# Patient Record
Sex: Female | Born: 1970 | Race: Black or African American | Hispanic: No | Marital: Single | Smoking: Never smoker
Health system: Southern US, Community
[De-identification: ages and names within clinical notes are randomized; demographics above are authoritative.]

## PROBLEM LIST (undated history)

## (undated) DIAGNOSIS — F419 Anxiety disorder, unspecified: Secondary | ICD-10-CM

## (undated) DIAGNOSIS — I5189 Other ill-defined heart diseases: Secondary | ICD-10-CM

## (undated) DIAGNOSIS — R591 Generalized enlarged lymph nodes: Secondary | ICD-10-CM

## (undated) DIAGNOSIS — F329 Major depressive disorder, single episode, unspecified: Secondary | ICD-10-CM

## (undated) DIAGNOSIS — R079 Chest pain, unspecified: Secondary | ICD-10-CM

## (undated) DIAGNOSIS — D869 Sarcoidosis, unspecified: Secondary | ICD-10-CM

## (undated) DIAGNOSIS — F32A Depression, unspecified: Secondary | ICD-10-CM

## (undated) DIAGNOSIS — I493 Ventricular premature depolarization: Secondary | ICD-10-CM

## (undated) DIAGNOSIS — IMO0001 Reserved for inherently not codable concepts without codable children: Secondary | ICD-10-CM

## (undated) DIAGNOSIS — Z9289 Personal history of other medical treatment: Secondary | ICD-10-CM

## (undated) DIAGNOSIS — J189 Pneumonia, unspecified organism: Secondary | ICD-10-CM

## (undated) DIAGNOSIS — E0590 Thyrotoxicosis, unspecified without thyrotoxic crisis or storm: Secondary | ICD-10-CM

## (undated) DIAGNOSIS — R000 Tachycardia, unspecified: Secondary | ICD-10-CM

## (undated) DIAGNOSIS — J45909 Unspecified asthma, uncomplicated: Secondary | ICD-10-CM

## (undated) DIAGNOSIS — A419 Sepsis, unspecified organism: Secondary | ICD-10-CM

## (undated) DIAGNOSIS — M797 Fibromyalgia: Secondary | ICD-10-CM

## (undated) DIAGNOSIS — R32 Unspecified urinary incontinence: Secondary | ICD-10-CM

## (undated) HISTORY — DX: Other ill-defined heart diseases: I51.89

## (undated) HISTORY — DX: Generalized enlarged lymph nodes: R59.1

## (undated) HISTORY — DX: Pneumonia, unspecified organism: J18.9

## (undated) HISTORY — DX: Sepsis, unspecified organism: A41.9

## (undated) HISTORY — DX: Chest pain, unspecified: R07.9

## (undated) HISTORY — DX: Thyrotoxicosis, unspecified without thyrotoxic crisis or storm: E05.90

## (undated) HISTORY — DX: Personal history of other medical treatment: Z92.89

## (undated) HISTORY — DX: Ventricular premature depolarization: I49.3

## (undated) HISTORY — DX: Sarcoidosis, unspecified: D86.9

## (undated) HISTORY — DX: Tachycardia, unspecified: R00.0

## (undated) HISTORY — PX: LYMPH NODE BIOPSY: SHX201

---

## 2005-07-23 ENCOUNTER — Emergency Department: Payer: Self-pay | Admitting: Emergency Medicine

## 2005-07-23 ENCOUNTER — Other Ambulatory Visit: Payer: Self-pay

## 2008-01-30 ENCOUNTER — Emergency Department: Payer: Self-pay | Admitting: Emergency Medicine

## 2011-06-25 ENCOUNTER — Emergency Department: Payer: Self-pay | Admitting: Emergency Medicine

## 2014-11-03 ENCOUNTER — Inpatient Hospital Stay: Payer: Self-pay | Admitting: Internal Medicine

## 2014-11-06 ENCOUNTER — Ambulatory Visit
Admit: 2014-11-06 | Disposition: A | Discharge status: Home / Self Care | Payer: Self-pay | Attending: Internal Medicine | Admitting: Internal Medicine

## 2014-11-19 ENCOUNTER — Inpatient Hospital Stay: Payer: Self-pay | Admitting: Internal Medicine

## 2014-11-20 ENCOUNTER — Ambulatory Visit
Admit: 2014-11-20 | Disposition: A | Discharge status: Home / Self Care | Payer: Self-pay | Attending: Internal Medicine | Admitting: Internal Medicine

## 2014-11-20 ENCOUNTER — Inpatient Hospital Stay: Payer: Self-pay | Admitting: Internal Medicine

## 2014-11-22 ENCOUNTER — Encounter: Payer: Self-pay | Admitting: Internal Medicine

## 2014-11-22 ENCOUNTER — Encounter (INDEPENDENT_AMBULATORY_CARE_PROVIDER_SITE_OTHER): Payer: Self-pay

## 2014-11-22 ENCOUNTER — Ambulatory Visit (INDEPENDENT_AMBULATORY_CARE_PROVIDER_SITE_OTHER): Payer: 59 | Admitting: Internal Medicine

## 2014-11-22 VITALS — BP 132/76 | HR 68 | Temp 97.6°F | Ht <= 58 in | Wt 116.0 lb

## 2014-11-22 DIAGNOSIS — R059 Cough, unspecified: Secondary | ICD-10-CM

## 2014-11-22 DIAGNOSIS — R0689 Other abnormalities of breathing: Secondary | ICD-10-CM

## 2014-11-22 DIAGNOSIS — R0600 Dyspnea, unspecified: Secondary | ICD-10-CM

## 2014-11-22 DIAGNOSIS — R05 Cough: Secondary | ICD-10-CM

## 2014-11-22 DIAGNOSIS — J984 Other disorders of lung: Secondary | ICD-10-CM

## 2014-11-22 DIAGNOSIS — H538 Other visual disturbances: Secondary | ICD-10-CM

## 2014-11-22 DIAGNOSIS — R0602 Shortness of breath: Secondary | ICD-10-CM | POA: Insufficient documentation

## 2014-11-22 NOTE — Assessment & Plan Note (Signed)
Shortness of breath and cough secondary to cavitary lung lesion, deconditioning, chronic illness over the past 3 or 4 months. See plan to cavitary lesion

## 2014-11-22 NOTE — Patient Instructions (Signed)
Follow up with Dr. Dema SeverinMungal in 1-2 weeks - you have a cavitary left lung lesion - suspicious for fungal infection or sarcoid or a combination of both. Workup as dictated below and awaiting final test results from your hospitalization (final fungal cultures, etc).  - we will schedule an ECHO and EKG with [facility]  - follow up with ID (Dr. Sampson GoonFitzgerald), continue with voriconazole based on his recommendations - pulmonary function testing and 6 minute walk test prior to follow up - please make an appointment to [place] prior to your follow up me (please ask them to fax copy of note to our office) - we will probably start steroids (Prednisone 40mg ), but after the above exams and follow up with Dr. Sampson GoonFitzgerald - please keep your appointment with [facility] on 11/26/14 at 8.20am.

## 2014-11-22 NOTE — Assessment & Plan Note (Signed)
Cough secondary to cavitary lung lesion, possible fungal infection from cavitary lung lesion, suspected sarcoidosis, lower on the differential is lymphoma.  Plan: -Supportive care and management as outlined for cavitary lung lesion

## 2014-11-22 NOTE — Progress Notes (Signed)
Date: 11/22/2014  MRN# [id number] Jennifer [name] 08/29/1971  [facility]  Jennifer PrestoRenee [name] is a 44 y.o. old female seen in consultation for hospital follow of cavitary lung lesion  CC:  Chief Complaint  Patient presents with  . Hospitalization Follow-up    Pt was d/c from the hospital. She had pneumonia. Pt c/o sob, cough with yellow mucus and chest tightness off/on.    HPI:  Patient is a pleasant 44 year old female is presenting today for hospital followup of cavitary lung lesion, bilateral pneumonia, evaluation for sarcoid. Briefly, patient was having increased shortness of breath and chest tightness and presented to the ED on February 27 found to have bilateral pneumonia chest x-ray subsequent CT showed a large left upper lobe cavitary lesion, she was followed by pulmonary, Dr. Belia HemanKasa, Hematology\Oncology (Dr. Sherrlyn HockPandit) and surgery (Dr. Sharmon RevereLindquist). Her prehospitalization course is described with recurrent upper respiratory tract infections, dyspnea on exertion, cough, fatigue, 30 pound weight loss. Prior to hospitalization she also endorsed fever and chills accompanied with night sweats. 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 Today patient states that she still having significant amount of cough, productive sputum (which is thick and white), vomiting after cough, shortness of breath on exertion, blurry vision. Patient states that she's had blurred vision for at least a year. She denies any skin lesions of tender nodules on her lower extremities. Prior to hospitalization she does endorse intermittent episodes of nausea and vomiting accompanied with shortness of breath (as stated above). Patient also noted to have a PET CT done during the hospitalization that showed significant hypermetabolic activity in the axillary lymph nodes, this was followed by left axillary lymph node dissection, samples were sent for culture and pathology  review. Her differential diagnosis during hospitalization include fungal pneumonia, sarcoidosis, lymphoma-or a combination of any one of these. Off note, patient states that she was treated multiple times in the last 3-4 months with antibiotics and steroids; each time after steroid use her symptoms drastically improved.      [facility] Hospitalization Summary: DATE OF ADMISSION:  11/03/2014 DATE OF DISCHARGE:  11/13/2014  FINAL DIAGNOSES:  1.  Clinical sepsis, bilateral community-acquired pneumonia with cavitary lesions, turned out to be a fungal pneumonia.  2.  Diffuse lymphadenopathy, preliminary results show granulomatous disease and this could possibly be sarcoid.  3.  Tachycardia.   MEDICATIONS ON DISCHARGE: Include Tylenol 500 mg 2 tablets every 6 hours as needed for pain, metoprolol tartrate 25 mg twice a day, codeine guaifenesin 10/100 per 5 mL, 5 mL every 8 hours for 10 days as needed for cough. Tessalon Perles 100 mg every 6 hours as needed for cough, voriconazole 200 mg every 12 hours for 15 days.   HOME HEALTH: None.   OXYGEN: No.    DIET: Regular diet, regular consistency.   FOLLOWUP: With Dr. Dema SeverinMungal, pulmonary, 1 week, Dr. Sherrlyn HockPandit, hematology/oncology, 1 week, Dr. Sampson GoonFitzgerald, infectious disease, 2 weeks, 1 to 2 weeks with your primary care physician.     HOSPITAL COURSE: The patient was admitted 11/03/2014, discharged 11/13/2014. Please see interim summary dictated by Dr. Elpidio AnisSudini  on March 4 for hospital course up until that point.   Laboratory and radiological data during the hospital course included an EKG, sinus tachycardia. Chest x-ray: Biapical airspace opacities, left greater than right, interstitial accenuation of the left perihilar airspace opacities. HIV test negative. Lipase normal. Urine pregnancy test negative. White count upon admission 7.5, hemoglobin 11.3, glucose 97, BUN 7,  creatinine 0.81, sodium 135, potassium 4.2, chloride 104, CO2 22, calcium 8.6. Liver function  test normal range. Urinalysis: Two plus blood.  Streptococcus pneumoniae antigen negative. Lactic acid 1.3. Hemoglobin A1c 4.9. Influenza negative. Blood cultures negative. CT scan of the chest with contrast showed highly unusual appearance of the chest, some imaging features suggestive of a systemic disease such as sarcoidosis, large thick-walled cavitary in the left upper lobe, atypical infection, malignancy not excluded, extensive mediastinal bilateral hilar, supraclavicular, and bilateral axillary lymphadenopathy could be suggestive of lymphoma. The patient had a bronchoscopy done on March 2 that was negative for malignant cells. Bronchial washings: Culture showed a heavy growth of fungus. As per Dr. Sampson Goon likely Aspergillus. Acid-fast smear negative. PET scan done on March 4 was hypermetabolic lymphadenopathy neck, chest, abdomen, and pelvis consistent with metabolic active tumor. Large necrotic left upper lobe lesion is hypermetabolic. Diffuse hypermetabolic uptake in the marrow space consistent with bony involvement. Hepatitis C negative, hepatitis B surface antigen negative. FANA negative. LDH 146.  ACE level 64 which is normal range. Lymph node biopsy fungus culture negative and negative for infection. White count upon discharge 10.8, creatinine 0.72. Liver function test: AST slightly elevated at 38.   Hospital course per problem list:   1.  For the patient's clinical sepsis with pneumonia believed to be community-acquired, required a bronchoscopy. Fungus grew out of the culture, possibly Aspergillus as per Dr. Sampson Goon. The patient has completed course of antibiotic, now is on voriconazole for another 15 days post discharge. Will need to follow up with Dr. Sampson Goon prior to finishing antibiotic treatment. This may be a prolonged course of voriconazole if this is an Aspergillus. The patient still has cough and was given some cough suppressants upon going home.  2.  Diffuse lymphadenopathy. I  spoke with the pathologist. Preliminary results looking at the film was granulomatous disease so the patient may actually have sarcoid. I did set up the patient with a followup appointment with Dr. Dema Severin. I spoke with Dr. Belia Heman who did not want to start steroids at this time with a fungal infection in the lungs but this could be a possibility as outpatient. We will refer to Dr. Dema Severin.  3.  Tachycardia. Metoprolol prescribed 25 mg twice a day to keep heart rate under control, likely secondary to the lung infection.   TIME SPENT ON DISCHARGE: 35 minutes.     Procedures: 11/07/14 - Bronchoscopy with LUL Brushing and BAL by Dr. Belia Heman - findings - LUL mucosal erythema and thick mucus.  11/12/14 - Left axillary LN dissection by Dr. Juliann Pulse   PMHX:   Past Medical History  Diagnosis Date  . Tachycardia   . Sepsis   . Pneumonia     bilateral community acquired   . Lymphadenopathy    Surgical Hx:  Past Surgical History  Procedure Laterality Date  . Cesarean section      1992/1994   Family Hx:  Family History  Problem Relation Age of Onset  . Lung cancer Mother    Social Hx:   History  Substance Use Topics  . Smoking status: Never Smoker   . Smokeless tobacco: Never Used  . Alcohol Use: No   Medication:   Current Outpatient Rx  Name  Route  Sig  Dispense  Refill  . acetaminophen (TYLENOL) 500 MG tablet   Oral   Take 500 mg by mouth every 6 (six) hours as needed.         . benzonatate (TESSALON) 100 MG  capsule   Oral   Take 100 mg by mouth every 6 (six) hours as needed.      0   . GUAIFENESIN AC 100-10 MG/5ML syrup   Oral   Take 5 mLs by mouth every 8 (eight) hours as needed.      0     Dispense as written.   . metoprolol tartrate (LOPRESSOR) 25 MG tablet   Oral   Take 25 mg by mouth 2 (two) times daily.      0   . voriconazole (VFEND) 200 MG tablet   Oral   Take 200 mg by mouth 2 (two) times daily.             Allergies:  Penicillin g  Review of  Systems: Gen:  Denies  fever, sweats, chills HEENT: blurred\fuzzy vision  Cvc:  No dizziness, chest pain or heaviness Resp: Admints cough or sputum porduction, shortness of breath Gi: Admits to stomach pain, nausea/vomiting, diarrhea, constipation Gu:  Denies bladder incontinence, burning urine Ext:   No Joint pain, stiffness or swelling Skin: No skin rash, easy bruising or bleeding or hives.  Admits to dry skin Endoc:  No polyuria, polydipsia , polyphagia.  Weight loss over the last 3 months Psych: No depression, insomnia or hallucinations  Other:  All other systems negative  Physical Examination:   VS: BP 132/76 mmHg  Pulse 68  Temp(Src) 97.6 F (36.4 C) (Oral)  Ht  (1.448 m)  Wt 116 lb (52.617 kg)  BMI 25.10 kg/m2  SpO2 98%  General Appearance: No distress  Neuro:without focal findings, mental status, speech normal, alert and oriented, cranial nerves 2-12 intact, reflexes normal and symmetric, sensation grossly normal  HEENT: PERRLA, EOM intact, no ptosis, no other lesions noticed; Mallampati: 2  Pulmonary: coarse upper airway sounds with transmission to the other lung fields, dec BS in the LUL, no wheezing, No rales;   Sputum Production:  none CardiovascularNormal S1,S2.  No m/r/g.  Abdominal aorta pulsation normal.    Abdomen: Benign, Soft, non-tender, No masses, hepatosplenomegaly, Renal:  No costovertebral tenderness  GU:  No performed at this time. Endoc: No evident thyromegaly, no signs of acromegaly or Cushing features Skin:   warm, no rashes, no ecchymosis, no skin nodules.  + Dry skin. Left axilla LN biopsy incision site with good healing, no drainage.  Extremities: normal, no cyanosis, clubbing, no edema, warm with normal capillary refill. Other findings:none   Labs results: 11/2014 ACE =64 (within normal range) ANA ab, IFA = negative HIV 1/2 = negative Hepatitis C negative Hepatitis B surface antigen negative.  FANA negative.  LDH 146.   Lymph node biopsy  fungus culture negative and negative for infection. AFB Smear = negative Fungal BAL culture - heavy fungal growth (samples sent to state lab) BAL = negative for malignant cells Left axillary lymph node biopsy-largest lymph node effaced by a noncaseating granulomatous inflammation; small node without morphologic evidence of malignancy and residual lymphoid tissue and partially involved by noncaseating granulomas. (Fungal and Gram stain are negative).   Rad results: (The following images and results were reviewed by Dr. Dema Severin). CT  - CT CHEST WITH CONTRAST  - Nov 05 2014   CLINICAL DATA:  44 year old female with history of cough and congestion for greater than 1 week, with left-sided chest pain. Symptoms acutely worsening over the past 72 hours.  EXAM: CT CHEST WITH CONTRAST  TECHNIQUE: Multidetector CT imaging of the chest was performed during intravenous contrast administration. CONTRAST:  75 mL of Omnipaque 300.  COMPARISON:  No priors.  FINDINGS: Mediastinum/Lymph Nodes: Heart size is normal. There is no significant pericardial fluid, thickening or pericardial calcification. Multiple borderline enlarged and enlarged mediastinal and hilar lymph nodes are noted, and including a 14 mm superior mediastinal lymph node (image 12 of series 2), 13 mm short axis left suprahilar lymph node, 10 mm high left paratracheal lymph node, 11 mm subcarinal node, and 11 mm right hilar lymph node. Esophagus is mildly patulous. Bilateral axillary lymphadenopathy (left greater than right), with the largest left axillary lymph node measuring 15 mm in short axis. Multiple enlarged supraclavicular lymph nodes bilaterally measuring up to 11 mm.  Lungs/Pleura: Extensive multifocal thickening of the peribronchovascular interstitium with extensive multifocal peribronchovascular micro and macronodularity most evident in the mid to upper lungs bilaterally, with relative sparing of the lung bases. In the  left upper lobe there is a large thick-walled cavity measuring approximately 4.5 x 3.8 x 4.9 cm. Additional thickening in the lungs bilaterally. Upward retraction of hilar structures. Small left pleural effusion layering dependently. No right pleural effusion.  Upper Abdomen: Multiple borderline enlarged and mildly enlarged retroperitoneal lymph nodes, largest of which measures up to 1.0 cm in short axis. Musculoskeletal/Soft Tissues: There are no aggressive appearing lytic or blastic lesions noted in the visualized portions of the skeleton.   IMPRESSION: 1. Highly unusual appearance of the chest. There are some imaging features suggestive of a systemic disease such as sarcoidosis, as detailed above. However, given the upper lung predominant findings, and the presence of a large thick-walled cavity in the left upper lobe, the possibility of atypical infection, including mycobacterium tuberculosis, warrants strong consideration. If there is any clinical history concerning for possible tuberculosis, respiratory isolation and sputum testing is recommended at this time. 2. Malignancy is not excluded, but given the mid and upper lung predominance of the findings, and the perilymphatic (rather than random) distribution of the pulmonary nodules, pulmonary malignancy (either primary or metastatic) is not favored at this time. 3. However, given the presence of extensive mediastinal, bilateral hilar, supraclavicular and bilateral axillary lymphadenopathy, underlying lymphoproliferative disorder such as lymphoma may be present. 4. Small left pleural effusion layering dependently.      PET CT 11/09/14 NUCLEAR MEDICINE PET SKULL BASE TO THIGH  TECHNIQUE: 12.3 mCi F-18 FDG was injected intravenously. Full-ring PET imaging was performed from the skull base to thigh after the radiotracer. CT data was obtained and used for attenuation correction and anatomic localization.  FASTING BLOOD  GLUCOSE:  Value: 111 mg/dl  COMPARISON:  CT scan from 11/05/2014  FINDINGS: NECK  Scattered small lymph nodes are seen in the neck bilaterally and are hypermetabolic with SUV max values in the 5 range.  CHEST  Hypermetabolic lymphadenopathy is seen in both supraclavicular regions. 1.2 x 1.2 cm right supraclavicular node visible on image 45 series 3. The large necrotic mass in the left upper lobe is markedly hypermetabolic with SUV max = 8.4. Disease in the right upper lobe is also hypermetabolic with SUV max = 5.3. 11 x 20 mm left axillary lymph node demonstrates SUV max = 3.7. Mediastinal and hilar lymphadenopathy is hypermetabolic.  ABDOMEN/PELVIS  There is some focal hyper metabolism in the region of the hepatoduodenal ligament. Although the underlying lymphadenopathy can be seen on the uninfused CT images, appearance is suspicious for upper abdominal lymph node involvement.  There are hypermetabolic lymph nodes in both groin regions, and along the pelvic sidewalls bilaterally. Index lymph node in the  right groin area measures SUV max = 7.4  SKELETON  Diffuse uptake within the skeletal structures suggest metastatic involvement of the marrow space.   IMPRESSION: Hypermetabolic lymphadenopathy in the neck, chest, abdomen, and pelvis consistent with metabolically active tumor. The large necrotic left upper lobe lesion is hypermetabolic as is scattered pulmonary parenchymal disease elsewhere in both lungs.  Diffuse hypermetabolic uptake in the marrow space consistent with bony involvement by tumor.    Assessment and Plan:44 year old female seen for hospital followup of cavitary lung lesion, left upper lobe. Dyspnea and respiratory abnormality Shortness of breath and cough secondary to cavitary lung lesion, deconditioning, chronic illness over the past 3 or 4 months. See plan to cavitary lesion   Cough Cough secondary to cavitary lung lesion, possible fungal  infection from cavitary lung lesion, suspected sarcoidosis, lower on the differential is lymphoma.  Plan: -Supportive care and management as outlined for cavitary lung lesion   Cavitary lesion of lung Left upper lobe cavitary lung lesion with significant hilar (bilateral) and maxillary lymphadenopathy. 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 Patient is currently being treated with voriconazole and will be following with infectious disease. I have a high suspicion for sarcoid disease over fungal infection, I believe fungal infection is more opportunistic than the primary cause of her cavitary lung lesion.  Patient does have a normal ACE level, however specificity of using ACE level for diagnosis of sarcoid is very low, and a low ACE level can be seen in sarcoid patients; ACE level is used as more of a monitoring modality for treatment response with respect to sarcoid patients. For this patient prednisone (0.3 mg-0.5 mg per kilogram per day) (or 40mg  daily) would be the recommended dose given her level of symptoms, radiographic findings and clinical appearance. Her fungal cultures from the BAL washings are currently positive but speciation is still pending. Will discuss further with infectious disease before initiating steroids plus antifungals. In the meantime, in preparation for possible medium dose to high-dose steroid initiation will plan for recommended pretesting with pulmonary function testing, 6 minute walk test, 2-D echocardiogram, optometry evaluation, EKG. The above was discussed in detail with the patient, who is in agreement.  Plan: -Followup in  one to 2 weeks -Pulmonary function testing and 6 minute walk testing prior to followup -Continue followup with infectious disease; will discuss steroid initiation with Dr. Sampson Goon (infectious disease) -2-D echocardiogram and EKG -Optometry evaluation     Updated Medication List Outpatient Encounter Prescriptions as of 11/22/2014  Medication Sig  . benzonatate (TESSALON) 100 MG capsule Take 100 mg by mouth every 6 (six) hours as needed.  . GUAIFENESIN AC 100-10 MG/5ML syrup Take 5 mLs by mouth every 8 (eight) hours as needed.  . metoprolol tartrate (LOPRESSOR) 25 MG tablet Take 25 mg by mouth 2 (two) times daily.    Orders for this visit: Orders Placed This Encounter  Procedures  . Ambulatory referral to Ophthalmology    Referral Priority:  Routine    Referral Type:  Consultation    Referral Reason:  Specialty Services Required    Requested Specialty:  Ophthalmology    Number of Visits Requested:  1  . Spirometry with Graph    Order Specific Question:  Where should this test be  performed?    Answer:  [facility]    Order Specific Question:  Basic spirometry    Answer:  Yes    Order Specific Question:  Spirometry pre & post bronchodilator    Answer:  No     Thank  you for the consultation and for allowing [facility] to assist in the care of your patient. Our recommendations are noted above.  Please contact us if we can be of further service.   Stephanie Acre, MD [facility] Office Number: [phone]

## 2014-11-22 NOTE — Assessment & Plan Note (Signed)
Left upper lobe cavitary lung lesion with significant hilar (bilateral) and maxillary lymphadenopathy. 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 Patient is currently being treated with voriconazole and will be following with infectious disease. I have a high suspicion for sarcoid disease over fungal infection, I believe fungal infection is more opportunistic than the primary cause of her cavitary lung lesion.  Patient does have a normal ACE level, however specificity of using ACE level for diagnosis of sarcoid is very low, and a low ACE level can be seen in sarcoid patients; ACE level is used as more of a monitoring modality for treatment response with respect to sarcoid patients. For this patient prednisone (0.3 mg-0.5 mg per kilogram per day) (or 40mg  daily) would be the recommended dose given her level of symptoms, radiographic findings and clinical appearance. Her fungal cultures from the BAL washings are currently positive but speciation is still pending. Will discuss further with infectious disease before initiating steroids plus antifungals. In the meantime, in preparation for possible medium dose to high-dose steroid initiation will plan for recommended pretesting with pulmonary function testing, 6 minute walk test, 2-D echocardiogram, optometry evaluation, EKG. The above was discussed in detail with the patient, who is in agreement.  Plan: -Followup in one to 2 weeks -Pulmonary function testing and 6 minute walk testing prior to followup -Continue followup with infectious disease; will discuss steroid initiation with Dr. Sampson GoonFitzgerald (infectious  disease) -2-D echocardiogram and EKG -Optometry evaluation

## 2014-11-23 ENCOUNTER — Other Ambulatory Visit: Payer: Self-pay | Admitting: Internal Medicine

## 2014-11-23 ENCOUNTER — Encounter: Payer: Self-pay | Admitting: Internal Medicine

## 2014-11-23 DIAGNOSIS — R0602 Shortness of breath: Secondary | ICD-10-CM

## 2014-11-23 DIAGNOSIS — J17 Pneumonia in diseases classified elsewhere: Secondary | ICD-10-CM

## 2014-11-23 DIAGNOSIS — B49 Unspecified mycosis: Secondary | ICD-10-CM | POA: Insufficient documentation

## 2014-11-25 NOTE — Progress Notes (Signed)
Patient left upper lobe cavitary lesion, bilateral hilar adenopathy, axillary lymphadenopathy status post left axilla lymph node dissection. BAL performed during hospitalization currently growing elements consistent with fungal infection, Dr. Sampson GoonFitzgerald (infectious disease,) has reviewed the slides and is suspicious for Aspergillus, he has had the fungal culture to the state lab for further speciation and diagnosis. 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 After further discussion with Dr. Sampson GoonFitzgerald, the consensus between pulmonary and infectious disease at this time is to give the patient one month of voriconazole if no clinical improvement at that time will add prednisone to her treatment plan.

## 2014-11-26 ENCOUNTER — Other Ambulatory Visit: Payer: Self-pay

## 2014-11-26 ENCOUNTER — Other Ambulatory Visit (INDEPENDENT_AMBULATORY_CARE_PROVIDER_SITE_OTHER): Payer: 59

## 2014-11-26 ENCOUNTER — Ambulatory Visit (INDEPENDENT_AMBULATORY_CARE_PROVIDER_SITE_OTHER): Payer: 59

## 2014-11-26 ENCOUNTER — Other Ambulatory Visit: Payer: Self-pay | Admitting: Radiology

## 2014-11-26 ENCOUNTER — Encounter: Payer: Self-pay | Admitting: Internal Medicine

## 2014-11-26 VITALS — BP 130/82 | HR 122 | Ht <= 58 in | Wt 116.0 lb

## 2014-11-26 DIAGNOSIS — R0689 Other abnormalities of breathing: Secondary | ICD-10-CM | POA: Diagnosis not present

## 2014-11-26 DIAGNOSIS — R0600 Dyspnea, unspecified: Secondary | ICD-10-CM

## 2014-11-26 DIAGNOSIS — R000 Tachycardia, unspecified: Secondary | ICD-10-CM

## 2014-11-26 DIAGNOSIS — R0602 Shortness of breath: Secondary | ICD-10-CM

## 2014-11-26 MED ORDER — METOPROLOL TARTRATE 50 MG PO TABS: 50.0000 mg | ORAL_TABLET | Freq: Two times a day (BID) | ORAL | Status: DC

## 2014-11-26 NOTE — Telephone Encounter (Signed)
Erroneous encounter This encounter was created in error - please disregard. 

## 2014-11-26 NOTE — Progress Notes (Signed)
1.) Reason for visit:  Tachycardia  2.) Name of MD requesting visit: Dr. Dema SeverinMungal  3.) H&P: Cough, SOB  4.) ROS related to problem: Pt presents to office for EKG & ECHO per Dr. Courtney ParisMungal's request.  Pt is wearing a face mask and coughing throughout visit.  She also has an emesis basin w/ her due to coughing up phlegm.     5.) Assessment and plan per PA: Eula Listenyan Dunn, PA reviewed pt's EKGs and recommends 48 hr holter monitor and increasing Lopressor to 50 mg BID.

## 2014-11-26 NOTE — Patient Instructions (Addendum)
Your physician has recommended that you wear a holter monitor. Holter monitors are medical devices that record the heart's electrical activity. Doctors most often use these monitors to diagnose arrhythmias. Arrhythmias are problems with the speed or rhythm of the heartbeat. The monitor is a small, portable device. You can wear one while you do your normal daily activities. This is usually used to diagnose what is causing palpitations/syncope (passing out). [facility] will contact you regarding where and when to pick this up and have it placed.   Please increase your Lopressor to 50 mg twice daily  Call or return to clinic prn if these symptoms worsen or fail to improve as anticipated.

## 2014-11-27 ENCOUNTER — Other Ambulatory Visit: Payer: Self-pay

## 2014-11-27 ENCOUNTER — Observation Stay: Payer: Self-pay | Admitting: Internal Medicine

## 2014-11-27 DIAGNOSIS — R000 Tachycardia, unspecified: Secondary | ICD-10-CM

## 2014-11-29 DIAGNOSIS — R000 Tachycardia, unspecified: Secondary | ICD-10-CM | POA: Diagnosis not present

## 2014-11-29 LAB — URINE CULTURE

## 2014-11-29 NOTE — Progress Notes (Signed)
Was asked to review EKG for nurse visit. EKG showed sinus tachycardia with frequent PVCs, 118 bpm, nonspecific st/t changes. Patient showed stable vital signs and was resting comfortably on the exam room table. It was recommended that she go up on her Lopressor and 2/2 her PVCs and wear a 48 hour Holter monitor. She should follow up both with her pulmonologist and become established with cardology for further evaluation.

## 2014-12-03 ENCOUNTER — Encounter: Payer: Self-pay | Admitting: *Deleted

## 2014-12-03 ENCOUNTER — Ambulatory Visit: Payer: 59 | Admitting: Internal Medicine

## 2014-12-07 ENCOUNTER — Ambulatory Visit
Admit: 2014-12-07 | Disposition: A | Discharge status: Home / Self Care | Payer: Self-pay | Attending: Internal Medicine | Admitting: Internal Medicine

## 2014-12-08 LAB — CULTURE, FUNGUS WITHOUT SMEAR

## 2014-12-10 ENCOUNTER — Encounter (INDEPENDENT_AMBULATORY_CARE_PROVIDER_SITE_OTHER): Payer: 59

## 2014-12-10 ENCOUNTER — Ambulatory Visit (INDEPENDENT_AMBULATORY_CARE_PROVIDER_SITE_OTHER): Payer: 59 | Admitting: Internal Medicine

## 2014-12-10 ENCOUNTER — Other Ambulatory Visit: Payer: Self-pay

## 2014-12-10 ENCOUNTER — Other Ambulatory Visit: Payer: Self-pay | Admitting: *Deleted

## 2014-12-10 VITALS — BP 90/68 | HR 88 | Temp 98.0°F | Ht 59.0 in | Wt 113.0 lb

## 2014-12-10 DIAGNOSIS — R0600 Dyspnea, unspecified: Secondary | ICD-10-CM

## 2014-12-10 DIAGNOSIS — R0689 Other abnormalities of breathing: Principal | ICD-10-CM

## 2014-12-10 DIAGNOSIS — R05 Cough: Secondary | ICD-10-CM

## 2014-12-10 DIAGNOSIS — R059 Cough, unspecified: Secondary | ICD-10-CM

## 2014-12-10 DIAGNOSIS — J984 Other disorders of lung: Secondary | ICD-10-CM | POA: Diagnosis not present

## 2014-12-10 DIAGNOSIS — R000 Tachycardia, unspecified: Secondary | ICD-10-CM

## 2014-12-10 NOTE — Patient Instructions (Addendum)
Follow up with Dr. Dema SeverinMungal in 3 weeks - keep your appointment with your eye doctor - keep your appointment with Dr. Sampson GoonFitzgerald (Infectious Disease) - after your visit with Dr. Sampson GoonFitzgerald we will decide on steroids.

## 2014-12-10 NOTE — Progress Notes (Signed)
MRN# [id number] Jennifer [name] 1970-12-07   CC: "followup for my lung lesion" Chief Complaint  Patient presents with  . Follow-up    Pt attempted PFT was not able to perform, we did office spirometry and smw. Pt has dizziness during smw and leg fatigue.      Brief History: Synopsis: 45 year old female with left upper lobe cavitary lesion, history of bilateral pneumonia, currently being followed by pulmonary and infectious disease. Cavitary lesion differential at this time includes fungal infection versus sarcoidosis. Currently being treated as fungal infection by infectious disease, if no clinical response in the next 2-3 weeks and steroids will be initiated.  History of present illness:Patient is a pleasant 44 year old female is presenting today for hospital followup of cavitary lung lesion, bilateral pneumonia, evaluation for sarcoid. Briefly, patient was having increased shortness of breath and chest tightness and presented to the ED on February 27 found to have bilateral pneumonia chest x-ray subsequent CT showed a large left upper lobe cavitary lesion, she was followed by pulmonary, Dr. Belia Heman, Hematology\Oncology (Dr. Sherrlyn Hock) and surgery (Dr. Sharmon Revere). Her prehospitalization course is described with recurrent upper respiratory tract infections, dyspnea on exertion, cough, fatigue, 30 pound weight loss. Prior to hospitalization she also endorsed fever and chills accompanied with night sweats. She is a never smoker and currently works in [employer] at [employer]. Today patient states that she still having significant amount of cough, productive sputum (which is thick and white), vomiting after cough, shortness of breath on exertion, blurry vision. Patient states that she's had blurred vision for at least a year. She denies any skin lesions of tender nodules on her lower extremities. Prior to hospitalization she does endorse intermittent episodes of nausea and vomiting accompanied with  shortness of breath (as stated above). Patient also noted to have a PET CT done during the hospitalization that showed significant hypermetabolic activity in the axillary lymph nodes, this was followed by left axillary lymph node dissection, samples were sent for culture and pathology review. Her differential diagnosis during hospitalization include fungal pneumonia, sarcoidosis, lymphoma-or a combination of any one of these. Off note, patient states that she was treated multiple times in the last 3-4 months with antibiotics and steroids; each time after steroid use her symptoms drastically improved. Plan: Continue treating as fungal cavitary lesion with voriconazole, echocardiogram, followup with optometry, cough suppression.   Events since last clinic visit: She presents today for a followup visit of a left upper lobe cavitary lesion. Since her last visit she has followed with infectious disease is currently treating her with voriconazole. Current Aspergillus cultures are still pending from the state lab. Patient states that she's also had a recent admission to [facility] on March 22 2 11/28/2014. He presented to the hospital with nausea and vomiting from excessive coughing and was treated with Zofran, and supportive care. Today patient states she still has some coughing, but it is actually improving. She still endorses cough with shortness of breath and productive sputum (thick white at times).  Today she tried a full pulmonary function testing, was able to follow directions accurately, this was converted to a spirometry. She also attempted a 6 minute walk test, but noted to have moderate dizziness and leg weakness and during the 6 minutes only walk about 66 m/216 feet, she was also noted to be walking very slowly.  PMHX:   Past Medical History  Diagnosis Date  . Tachycardia   . Sepsis   . Pneumonia     bilateral community acquired   .  Lymphadenopathy    Surgical Hx:  Past Surgical History   Procedure Laterality Date  . Cesarean section      1992/1994   Family Hx:  Family History  Problem Relation Age of Onset  . Lung cancer Mother    Social Hx:   History  Substance Use Topics  . Smoking status: Never Smoker   . Smokeless tobacco: Never Used  . Alcohol Use: No   Medication:   Current Outpatient Rx  Name  Route  Sig  Dispense  Refill  . acetaminophen (TYLENOL) 500 MG tablet   Oral   Take 500 mg by mouth every 6 (six) hours as needed.         Marland Kitchen ADVAIR DISKUS 250-50 MCG/DOSE AEPB   Inhalation   Inhale 250 mcg into the lungs as directed.           Dispense as written.   . benzonatate (TESSALON) 100 MG capsule   Oral   Take 100 mg by mouth every 6 (six) hours as needed.      0   . citalopram (CELEXA) 20 MG tablet   Oral   Take 20 mg by mouth at bedtime.      0   . fluticasone (FLONASE) 50 MCG/ACT nasal spray   Each Nare   Place 2 sprays into both nostrils daily.      0   . GUAIFENESIN AC 100-10 MG/5ML syrup   Oral   Take 5 mLs by mouth every 8 (eight) hours as needed.      0     Dispense as written.   . metoprolol tartrate (LOPRESSOR) 50 MG tablet   Oral   Take 1 tablet (50 mg total) by mouth 2 (two) times daily.   30 tablet   6   . ondansetron (ZOFRAN) 4 MG tablet   Oral   Take 4 mg by mouth every 6 (six) hours as needed.      0   . polyethylene glycol powder (GLYCOLAX/MIRALAX) powder   Oral   Take 17 g by mouth as needed.      0   . PROAIR HFA 108 (90 BASE) MCG/ACT inhaler   Inhalation   Inhale 108 mcg into the lungs every 6 (six) hours as needed.           Dispense as written.   . voriconazole (VFEND) 200 MG tablet   Oral   Take 200 mg by mouth 2 (two) times daily.            Review of Systems: Gen:  Denies  fever, sweats, chills HEENT: Denies blurred vision, double vision, ear pain, eye pain, hearing loss, nose bleeds, sore throat Cvc:  No dizziness, chest pain or heaviness Resp:   CN exertion, cough, mild  intermittent productive sputum Gi: Denies swallowing difficulty, stomach pain, nausea or vomiting, diarrhea, constipation, bowel incontinence Gu:  Denies bladder incontinence, burning urine Ext:   No Joint pain, stiffness or swelling Skin: No skin rash, easy bruising or bleeding or hives Endoc:  No polyuria, polydipsia , polyphagia or weight change Psych: No depression, insomnia or hallucinations  Other:  All other systems negative  Allergies:  Penicillin g  Physical Examination:  VS: There were no vitals taken for this visit.  General Appearance: No distress  Neuro: EXAM: without focal findings, mental status, speech normal, alert and oriented, cranial nerves 2-12 grossly normal  HEENT: PERRLA, EOM intact, no ptosis, no other lesions noticed Pulmonary:Exam: decreased breath sounds  at the bases, no wheezes, no crackles, no rales. Cardiovascular:@ Exam:  Normal S1,S2.  No m/r/g.     Abdomen:Exam: Benign, Soft, non-tender, No masses  Skin:   warm, no rashes, no ecchymosis  Extremities: normal, no cyanosis, clubbing, no edema, warm with normal capillary refill.   Labs results:  BMP No results found for: NA, K, CL, CO2, GLUCOSE, BUN, CREATININE   CBC No flowsheet data found.   Rad results: (The following images and results were reviewed by Dr. Dema Severin). Chest CT 11/27/2014 Difficult to evaluate for mediastinal and hilar lymphadenopathy on this non contrast examination. Again noted are prominent lymph nodes in the left upper mediastinum which are grossly similar to the previous examination. The left pleural effusion has resolved. No significant pericardial fluid. Again noted are enlarged left axillary lymph nodes. There is edema in the left axilla and question excisional biopsy of a left axillary lymph node. Again noted are prominent right axillary lymph nodes and right sub pectoralis nodes. Evidence for supraclavicular lymphadenopathy.  Images of the upper abdomen are  unremarkable on this non contrast examination.  Again noted is a small gas collection along the right upper trachea which could represent a small diverticulum. Trachea and mainstem bronchi are patent. Again noted is central peribronchial thickening with air bronchograms and bronchiectasis in the left upper lobe. Again noted is a cavitary lesion in the left upper lobe. The cavitary structure measures 3.7 x 3.5 x 4.4 cm and previously measured 3.3 x 3.4 x 4.9 cm. There continues to be patchy parenchymal densities throughout the left upper lobe. Again noted are patchy nodular and irregular densities throughout the left lower lobe. Again noted is a pleural-based nodule in the left lower lobe measuring 6 mm on sequence 3, image 39 and minimally changed. Architecture distortion and patchy parenchymal densities throughout the right upper lobe has minimally changed. Again noted are areas of bronchiectasis in the right upper lobe which are similar to the previous examination. Patchy nodular densities in the right lower lobe are similar to the previous examination. No acute bone abnormality.   IMPRESSION: There is extensive parenchymal lung disease which has minimally changed since 11/05/2014. There continues to be a large cavitary lesion in the left upper lobe. Small left pleural effusion has resolved from the prior CT.  Patient continues to have diffuse lymphadenopathy, although it is poorly characterized on this non contrast examination. There is edema in the left axilla and suspect an excisional biopsy in this location. Differential diagnosis is unchanged from an imaging standpoint. The lymphadenopathy with diffuse parenchymal lung disease could be associated with sarcoidosis but the lymphadenopathy pattern raises concern for atypical infectious process and/or malignancy.     ECHO 11/26/14: Study Conclusions  - Left ventricle: The cavity size was normal. Wall thickness  was normal. Systolic function was normal. The estimated ejection fraction was in the range of 60% to 65%. Wall motion was normal; there were no regional wall motion abnormalities. Left ventricular diastolic function parameters were normal.  Impressions:  - Normal study.  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 Cavitary lesion of lung Left upper lobe cavitary lung lesion with significant hilar (bilateral) and maxillary lymphadenopathy. 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 Patient is currently being treated with voriconazole and will be following with infectious disease. I have a high suspicion for sarcoid disease over fungal infection, I believe fungal infection is more opportunistic than the primary cause of her cavitary lung lesion.  Patient does have a normal ACE level, however specificity of using ACE level for diagnosis of sarcoid is very low, and a low ACE level can be seen in sarcoid patients; ACE level is used as more of a monitoring modality for treatment response with respect to sarcoid patients. For this patient prednisone (0.3 mg-0.5 mg per kilogram per day) (or 40mg  daily) would be the recommended dose given her level of symptoms, radiographic findings and clinical appearance. Her fungal cultures from the BAL washings are currently positive for Aspergillus but speciation is still pending.  I have discussed the case with Dr. Sampson GoonFitzgerald from infectious disease, and at this time the consensus is to continue treating as a  fungal cavitary lesion, if patient continues to have poor clinical response over the next 2-3 weeks Will then reevaluate for steroid initiation for treating possible sarcoidosis.  Recent echo with a normal EF.  The above was discussed in detail with the patient, who is in agreement.  Plan: -Followup in one to 2 weeks -Continue followup with Dr. Sampson GoonFitzgerald (infectious disease) -patient states that optometry exam is still pending.    Dyspnea and respiratory abnormality Shortness of breath and cough secondary to cavitary lung lesion, deconditioning, chronic illness over the past 3 or 4 months. See plan for cavitary lesion.  Spirometry today FVC 1.34, 61% predicted FEV1 1.02, 53% predicted FEV1/FVC 76% actual, 88% predicted  Interpretation: Moderate to severe restriction and obstruction noted secondary to cavitary lung lesion     Cough Cough secondary to cavitary lung lesion, possible fungal infection from cavitary lung lesion, suspected sarcoidosis, lower on the differential is lymphoma.  Plan: -Supportive care and management as outlined for cavitary lung lesion - continue with Zofran as needed every 4-6 hours, 4 mg       Updated Medication List Outpatient Encounter Prescriptions as of 12/10/2014  Medication Sig  . acetaminophen (TYLENOL) 500 MG tablet Take 500 mg by mouth every 6 (six) hours as needed.  Marland Kitchen. ADVAIR DISKUS 250-50 MCG/DOSE AEPB Inhale 250 mcg into the lungs as directed.  . benzonatate (TESSALON) 100 MG capsule Take 100 mg by mouth every 6 (six) hours as needed.  . citalopram (CELEXA) 20 MG tablet Take 20 mg by mouth at bedtime.  . fluticasone (FLONASE) 50 MCG/ACT nasal spray Place 2 sprays into both nostrils daily.  . GUAIFENESIN AC 100-10 MG/5ML syrup Take 5 mLs by mouth every 8 (eight) hours as needed.  . metoprolol tartrate (LOPRESSOR) 50 MG tablet Take 1 tablet (50 mg total) by mouth 2 (two) times daily.  . ondansetron (ZOFRAN) 4 MG tablet Take 4 mg by  mouth every 6 (six) hours as needed.  . polyethylene glycol powder (GLYCOLAX/MIRALAX) powder Take 17 g by mouth as needed.  Marland Kitchen. PROAIR HFA 108 (90 BASE) MCG/ACT inhaler Inhale 108 mcg into the lungs every 6 (six) hours as needed.  . voriconazole (VFEND) 200 MG tablet Take 200 mg by mouth 2 (two) times daily.    Orders for this visit: Orders Placed This Encounter  Procedures  . Spirometry with Graph    Order Specific Question:  Where should this test be performed?    Answer:  [facility]    Order Specific Question:  Basic spirometry    Answer:  Yes    Order Specific Question:  Spirometry pre & post bronchodilator    Answer:  No    Thank  you for the visitation and for allowing  [facility] to assist in the care of your patient. Our recommendations are noted above.  Please contact us if we can be of further service.  Stephanie Acre, MD [facility] Office Number: [phone]

## 2014-12-11 ENCOUNTER — Other Ambulatory Visit: Payer: 59

## 2014-12-11 ENCOUNTER — Encounter: Payer: Self-pay | Admitting: Internal Medicine

## 2014-12-11 NOTE — Assessment & Plan Note (Signed)
Shortness of breath and cough secondary to cavitary lung lesion, deconditioning, chronic illness over the past 3 or 4 months. See plan for cavitary lesion.  Spirometry today FVC 1.34, 61% predicted FEV1 1.02, 53% predicted FEV1/FVC 76% actual, 88% predicted  Interpretation: Moderate to severe restriction and obstruction noted secondary to cavitary lung lesion

## 2014-12-11 NOTE — Assessment & Plan Note (Signed)
Left upper lobe cavitary lung lesion with significant hilar (bilateral) and maxillary lymphadenopathy. 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 Patient is currently being treated with voriconazole and will be following with infectious disease. I have a high suspicion for sarcoid disease over fungal infection, I believe fungal infection is more opportunistic than the primary cause of her cavitary lung lesion.  Patient does have a normal ACE level, however specificity of using ACE level for diagnosis of sarcoid is very low, and a low ACE level can be seen in sarcoid patients; ACE level is used as more of a monitoring modality for treatment response with respect to sarcoid patients. For this patient prednisone (0.3 mg-0.5 mg per kilogram per day) (or 40mg  daily) would be the recommended dose given her level of symptoms, radiographic findings and clinical appearance. Her fungal cultures from the BAL washings are currently positive for Aspergillus but speciation is still pending.  I have discussed the case with Dr. Sampson GoonFitzgerald from infectious disease, and at this time the consensus is to continue treating as a fungal cavitary lesion, if patient continues to have poor clinical response over the next 2-3 weeks Will then reevaluate for steroid initiation for treating possible sarcoidosis.  Recent echo with a normal EF.  The above was discussed in detail with the patient, who is in agreement.  Plan: -Followup in one to 2 weeks -Continue followup with Dr. Sampson GoonFitzgerald (infectious disease) -patient states that optometry exam is still pending.

## 2014-12-11 NOTE — Assessment & Plan Note (Signed)
Cough secondary to cavitary lung lesion, possible fungal infection from cavitary lung lesion, suspected sarcoidosis, lower on the differential is lymphoma.  Plan: -Supportive care and management as outlined for cavitary lung lesion - continue with Zofran as needed every 4-6 hours, 4 mg

## 2014-12-13 DIAGNOSIS — R0600 Dyspnea, unspecified: Secondary | ICD-10-CM | POA: Diagnosis not present

## 2014-12-13 DIAGNOSIS — R0689 Other abnormalities of breathing: Secondary | ICD-10-CM

## 2014-12-13 NOTE — Progress Notes (Signed)
SMW performed today. 

## 2014-12-14 ENCOUNTER — Telehealth: Payer: Self-pay

## 2014-12-14 ENCOUNTER — Other Ambulatory Visit: Payer: Self-pay | Admitting: *Deleted

## 2014-12-14 DIAGNOSIS — R05 Cough: Secondary | ICD-10-CM

## 2014-12-14 DIAGNOSIS — R0689 Other abnormalities of breathing: Secondary | ICD-10-CM

## 2014-12-14 DIAGNOSIS — R0600 Dyspnea, unspecified: Secondary | ICD-10-CM

## 2014-12-14 DIAGNOSIS — R059 Cough, unspecified: Secondary | ICD-10-CM

## 2014-12-14 NOTE — Telephone Encounter (Signed)
Request from Disability Determination Services, sent to [employer] on 12/14/2014 .

## 2014-12-31 LAB — SURGICAL PATHOLOGY

## 2015-01-03 ENCOUNTER — Ambulatory Visit (INDEPENDENT_AMBULATORY_CARE_PROVIDER_SITE_OTHER): Payer: Managed Care, Other (non HMO) | Admitting: Internal Medicine

## 2015-01-03 ENCOUNTER — Ambulatory Visit (INDEPENDENT_AMBULATORY_CARE_PROVIDER_SITE_OTHER): Payer: Managed Care, Other (non HMO) | Admitting: Cardiovascular Disease

## 2015-01-03 ENCOUNTER — Ambulatory Visit: Payer: 59 | Admitting: Internal Medicine

## 2015-01-03 ENCOUNTER — Encounter: Payer: Self-pay | Admitting: Cardiovascular Disease

## 2015-01-03 ENCOUNTER — Encounter: Payer: Self-pay | Admitting: Internal Medicine

## 2015-01-03 VITALS — BP 110/62 | HR 84 | Ht 59.0 in | Wt 113.8 lb

## 2015-01-03 VITALS — BP 90/60 | HR 104 | Wt 114.0 lb

## 2015-01-03 DIAGNOSIS — R059 Cough, unspecified: Secondary | ICD-10-CM

## 2015-01-03 DIAGNOSIS — Z7952 Long term (current) use of systemic steroids: Secondary | ICD-10-CM | POA: Diagnosis not present

## 2015-01-03 DIAGNOSIS — J984 Other disorders of lung: Secondary | ICD-10-CM

## 2015-01-03 DIAGNOSIS — I471 Supraventricular tachycardia: Secondary | ICD-10-CM

## 2015-01-03 DIAGNOSIS — R0600 Dyspnea, unspecified: Secondary | ICD-10-CM | POA: Diagnosis not present

## 2015-01-03 DIAGNOSIS — R05 Cough: Secondary | ICD-10-CM | POA: Diagnosis not present

## 2015-01-03 DIAGNOSIS — R0689 Other abnormalities of breathing: Secondary | ICD-10-CM

## 2015-01-03 DIAGNOSIS — R0602 Shortness of breath: Secondary | ICD-10-CM

## 2015-01-03 DIAGNOSIS — R000 Tachycardia, unspecified: Secondary | ICD-10-CM

## 2015-01-03 MED ORDER — GUAIFENESIN-CODEINE 100-10 MG/5ML PO SOLN: 5.0000 mL | ORAL | Status: DC | PRN

## 2015-01-03 MED ORDER — PREDNISONE 20 MG PO TABS: 20.0000 mg | ORAL_TABLET | Freq: Every day | ORAL | Status: DC

## 2015-01-03 NOTE — Progress Notes (Signed)
Patient ID: Jennifer [name], female    DOB: 05/12/1971, 44 y.o.   MRN: [id number]  HPI Comments: 44 year old female with left upper lobe cavitary lesion, history of bilateral pneumonia,  followed by pulmonary and infectious disease, imaging showing left upper lobe Cavitary lesion, being treated on fungals who presents to the cardiology office for new patient evaluation for tachycardia.  She reports that she was in the hospital 11/28/2014 with nausea, vomiting, coughing. She was started on Zofran, supportive care Holter monitor following discharge confirmed sinus tachycardia with ectopy, baseline heart rate greater than 100. Since that time she reports that she has been on antifungals, significant improvement in her breathing, sputum production.  She is able to walk further with less shortness of breath. In fact she reports she initially required wheelchair and a scooter to get around. Now she feels independent, requiring no aides. She is troubled by a persistent severe cough when she lay supine. She's not been able to sleep in a bed for many weeks. Currently sleeps upright. When she lay supine, she has severe coughing spasms, sometimes with productive sputum. Despite treatment with antifungals, this part of her management has not improved. She has tried over-the-counter cough syrups with minimal improvement. She has been on metoprolol since her discharge from the hospital  Echocardiogram done last month showed essentially normal LV function, normal right heart pressures  EKG on today's visit shows normal sinus rhythm with rate 84 bpm, nonspecific T wave abnormality to the anterior precordial leads  Other past medical history Prior to hospitalization she also endorsed fever and chills accompanied with night sweats. 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  PET CT done during the hospitalization  showed significant hypermetabolic activity in the axillary lymph  nodes, this was followed by left axillary lymph node dissection, samples were sent for culture and pathology review. Her differential diagnosis during hospitalization include fungal pneumonia, sarcoidosis, lymphoma-or a combination of any one of these.  Allergies  Allergen Reactions  . Penicillin G Hives    Outpatient Encounter Prescriptions as of 01/03/2015  Medication Sig  . acetaminophen (TYLENOL) 500 MG tablet Take 500 mg by mouth every 6 (six) hours as needed.  Marland Kitchen. ADVAIR DISKUS 250-50 MCG/DOSE AEPB Inhale 250 mcg into the lungs as directed.  . benzonatate (TESSALON) 100 MG capsule Take 100 mg by mouth every 6 (six) hours as needed.  . citalopram (CELEXA) 20 MG tablet Take 20 mg by mouth at bedtime.  . fluticasone (FLONASE) 50 MCG/ACT nasal spray Place 2 sprays into both nostrils daily.  . metoprolol tartrate (LOPRESSOR) 50 MG tablet Take 1 tablet (50 mg total) by mouth 2 (two) times daily.  . ondansetron (ZOFRAN) 4 MG tablet Take 4 mg by mouth every 6 (six) hours as needed.  . polyethylene glycol powder (GLYCOLAX/MIRALAX) powder Take 17 g by mouth as needed.  Marland Kitchen. PROAIR HFA 108 (90 BASE) MCG/ACT inhaler Inhale 108 mcg into the lungs every 6 (six) hours as needed.  . voriconazole (VFEND) 200 MG tablet Take 200 mg by mouth 2 (two) times daily.  Marland Kitchen. guaiFENesin-codeine 100-10 MG/5ML syrup Take 5 mLs by mouth every 4 (four) hours as needed for cough.  . [DISCONTINUED] GUAIFENESIN AC 100-10 MG/5ML syrup Take 5 mLs by mouth every 8 (eight) hours as needed.    Past Medical History  Diagnosis Date  . Tachycardia   . Sepsis   . Pneumonia     bilateral community acquired   .  Lymphadenopathy   . Sarcoidosis     Past Surgical History  Procedure Laterality Date  . Cesarean section      1992/1994  . Lymph node biopsy      Social History  reports that she has never smoked. She has never used smokeless tobacco. She reports that she does not drink alcohol or use illicit drugs.  Family  History family history includes Lung cancer in her mother.   Review of Systems  Constitutional: Positive for fatigue.  Respiratory: Positive for cough.   Cardiovascular: Negative.   Gastrointestinal: Negative.   Musculoskeletal: Negative.   Skin: Negative.   Neurological: Negative.   Hematological: Negative.   Psychiatric/Behavioral: Negative.     BP 110/62 mmHg  Pulse 84  Ht  (1.499 m)  Wt 113 lb 12 oz (51.597 kg)  BMI 22.96 kg/m2  Physical Exam  Constitutional: She is oriented to person, place, and time. She appears well-developed and well-nourished.  HENT:  Head: Normocephalic.  Nose: Nose normal.  Mouth/Throat: Oropharynx is clear and moist.  Eyes: Conjunctivae are normal. Pupils are equal, round, and reactive to light.  Neck: Normal range of motion. Neck supple. No JVD present.  Cardiovascular: Normal rate, regular rhythm, S1 normal, S2 normal, normal heart sounds and intact distal pulses.  Exam reveals no gallop and no friction rub.   No murmur heard. Pulmonary/Chest: Effort normal and breath sounds normal. No respiratory distress. She has no wheezes. She has no rales. She exhibits no tenderness.  Rales in upper lobes  Abdominal: Soft. Bowel sounds are normal. She exhibits no distension. There is no tenderness.  Musculoskeletal: Normal range of motion. She exhibits no edema or tenderness.  Lymphadenopathy:    She has no cervical adenopathy.  Neurological: She is alert and oriented to person, place, and time. Coordination normal.  Skin: Skin is warm and dry. No rash noted. No erythema.  Psychiatric: She has a normal mood and affect. Her behavior is normal. Judgment and thought content normal.    Assessment and Plan  Nursing note and vitals reviewed.

## 2015-01-03 NOTE — Assessment & Plan Note (Addendum)
Shortness of breath and cough secondary to cavitary lung lesion, deconditioning, chronic illness over the past 3 or 4 months. See plan for cavitary lesion.  Spirometry 12/11/2014 FVC 1.34, 61% predicted FEV1 1.02, 53% predicted FEV1/FVC 76% actual, 88% predicted  Interpretation: Moderate to severe restriction and obstruction noted secondary to cavitary lung lesion  Plan: -See plan for cavitary lesion -Will consider repeating pulmonary function testing and 6 minute walk test in 3-4 months       

## 2015-01-03 NOTE — Assessment & Plan Note (Signed)
Followed by infectious disease and pulmonary

## 2015-01-03 NOTE — Assessment & Plan Note (Addendum)
Left upper lobe cavitary lung lesion with significant hilar (bilateral) and maxillary lymphadenopathy. 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 Patient is currently being treated with voriconazole and will be following with infectious disease. I have a high suspicion for sarcoid disease over fungal infection, I believe fungal infection is more opportunistic than the primary cause of her cavitary lung lesion.  Patient does have a normal ACE level, however specificity of using ACE level for diagnosis of sarcoid is very low, and a low ACE level can be seen in sarcoid patients; ACE level is used as more of a monitoring modality for treatment response with respect to sarcoid patients. For this patient prednisone 20 mg daily will be given for the first 6 weeks of treatment and then assess clinically Her fungal cultures from the BAL washings are currently positive for Aspergillus but speciation is still pending.  I have discussed the case with Dr. 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 Recent echo with a normal EF.  The above was discussed in detail with the patient, who is in agreement.  Patient has had her optometry exam and is currently wearing new prescription glasses.  Plan: -Prednisone 20 mg daily until follow-up -Continue followup with Dr. Sampson GoonFitzgerald (infectious disease) -2 view chest x-ray prior to follow-up

## 2015-01-03 NOTE — Assessment & Plan Note (Signed)
It would appear that her heart rate has significantly improved since her discharge from the hospital.  This is likely secondary to improved infection/sepsis. Currently with less sputum production, less shortness of breath with exertion. Encouraged her to stay on her metoprolol at her current dose. Recent normal echocardiogram, normal ejection fraction. Low concern for ischemia as she is a nonsmoker, nondiabetic. No further testing needed at this time. As she is relatively asymptomatic apart from cough and her underlying lung pathology, we can continue to monitor her periodically.

## 2015-01-03 NOTE — Patient Instructions (Addendum)
You are doing well.  Try the  robitussin with codeine right before bed, Repeat if needed in the middle of the night  In the day, do the regular robitussin  Please call us if you have new issues that need to be addressed before your next appt.

## 2015-01-03 NOTE — Assessment & Plan Note (Signed)
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 She is currently on antifungal treatment, and given the high suspicion for sarcoidosis, patient will be placed on chronic steroids. Steroid use and is to be 3-6 months or longer given patient clinical response. The use of long-term prednisone was explained to the patient including its administration and side effects which could include or retention, abnormal calcium levels, steroid induced diabetes etc.  Plan: -Prednisone 20 mg daily until follow-up with pulmonary physician. -Referral to endocrinology for chronic steroid use.

## 2015-01-03 NOTE — Assessment & Plan Note (Signed)
She reports dramatic improvement in her symptoms with current management. Now ambulating without assistance, able to walk at a reasonable pace without stopping. Significantly improved sputum production except for when she is supine and has severe coughing.

## 2015-01-03 NOTE — Progress Notes (Signed)
MRN# [id number] Jennifer [name] 06/21/1971   CC: Shortness of breath, cough Chief Complaint  Patient presents with  . Follow-up    SOB w/activity, worse with extreme activity; dry cough, worse at night;       Brief History: Synopsis: 44 year old female with left upper lobe cavitary lesion, history of bilateral pneumonia, currently being followed by pulmonary and infectious disease. Cavitary lesion differential at this time includes fungal infection versus sarcoidosis. Currently being treated as fungal infection by infectious disease, if no clinical response in the next 2-3 weeks and steroids will be initiated.  History of present illness:Patient is a pleasant 44 year old female is presenting today for hospital followup of cavitary lung lesion, bilateral pneumonia, evaluation for sarcoid. Briefly, patient was having increased shortness of breath and chest tightness and presented to the ED on February 27 found to have bilateral pneumonia chest x-ray subsequent CT showed a large left upper lobe cavitary lesion, she was followed by pulmonary, Dr. Belia HemanKasa, Hematology\Oncology (Dr. Sherrlyn HockPandit) and surgery (Dr. Sharmon RevereLindquist). Her prehospitalization course is described with recurrent upper respiratory tract infections, dyspnea on exertion, cough, fatigue, 30 pound weight loss. Prior to hospitalization she also endorsed fever and chills accompanied with night sweats. 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 Today patient states that she still having significant amount of cough, productive sputum (which is thick and white), vomiting after cough, shortness of breath on exertion, blurry vision. Patient states that she's had blurred vision for at least a year. She denies any skin lesions of tender nodules on her lower extremities. Prior to hospitalization she does endorse intermittent episodes of nausea and vomiting accompanied with shortness of breath (as stated above). Patient  also noted to have a PET CT done during the hospitalization that showed significant hypermetabolic activity in the axillary lymph nodes, this was followed by left axillary lymph node dissection, samples were sent for culture and pathology review. Her differential diagnosis during hospitalization include fungal pneumonia, sarcoidosis, lymphoma-or a combination of any one of these. Off note, patient states that she was treated multiple times in the last 3-4 months with antibiotics and steroids; each time after steroid use her symptoms drastically improved. Plan: Continue treating as fungal cavitary lesion with voriconazole, echocardiogram, followup with optometry, cough suppression.   ROV 12/20/14: She presents today for a followup visit of a left upper lobe cavitary lesion. Since her last visit she has followed with infectious disease is currently treating her with voriconazole. Current Aspergillus cultures are still pending from the state lab. Patient states that she's also had a recent admission to [facility] on March 22 2 11/28/2014. He presented to the hospital with nausea and vomiting from excessive coughing and was treated with Zofran, and supportive care. Today patient states she still has some coughing, but it is actually improving. She still endorses cough with shortness of breath and productive sputum (thick white at times).  Today she tried a full pulmonary function testing, was able to follow directions accurately, this was converted to a spirometry. She also attempted a 6 minute walk test, but noted to have moderate dizziness and leg weakness and during the 6 minutes only walk about 66 m/216 feet, she was also noted to be walking very slowly. 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 Since her last visit her cough has improved, but  is still prevalent, her shortness of breath has improved but is still impacting daily activities. Today she is accompanied by her sister. I have discussed this case with Dr. 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 This was explained to the patient, we will start steroid therapy today.  Medication:   Current Outpatient Rx  Name  Route  Sig  Dispense  Refill  . acetaminophen (TYLENOL) 500 MG tablet   Oral   Take 500 mg by mouth every 6 (six) hours as needed.         Marland Kitchen ADVAIR DISKUS 250-50 MCG/DOSE AEPB   Inhalation   Inhale 250 mcg into the lungs as directed.           Dispense as written.   . benzonatate (TESSALON) 100 MG capsule   Oral   Take 100 mg by mouth every 6 (six) hours as needed.      0   . citalopram (CELEXA) 20 MG tablet   Oral   Take 20 mg by mouth at bedtime.      0   . fluticasone (FLONASE) 50 MCG/ACT nasal spray   Each Nare   Place 2 sprays into both nostrils daily.      0   . guaiFENesin-codeine 100-10 MG/5ML syrup   Oral   Take 5 mLs by mouth every 4 (four) hours as needed for cough.   120 mL   1   . metoprolol tartrate (LOPRESSOR) 50 MG tablet   Oral   Take 1 tablet (50 mg total) by mouth 2 (two) times daily.   30 tablet   6   . ondansetron (ZOFRAN) 4 MG tablet   Oral   Take 4 mg by mouth every 6 (six) hours as needed.      0   . polyethylene glycol powder (GLYCOLAX/MIRALAX) powder   Oral   Take 17 g by mouth as needed.      0   . PROAIR HFA 108 (90 BASE) MCG/ACT inhaler   Inhalation   Inhale 108 mcg into the lungs every 6 (six) hours as needed.           Dispense as written.   . voriconazole (VFEND) 200 MG tablet   Oral   Take 200 mg by mouth 2 (two) times daily.            Review of Systems: Gen:  Denies  fever, sweats, chills HEENT: Denies blurred vision, double vision, ear pain, eye pain, hearing loss, nose bleeds, sore throat Cvc:  No dizziness, chest pain or  heaviness Resp:   Admits to: Dry cough, shortness of breath Gi: Denies swallowing difficulty, stomach pain, nausea or vomiting, diarrhea, constipation, bowel incontinence Gu:  Denies bladder incontinence, burning urine Ext:   No Joint pain, stiffness or swelling Skin: No skin rash, easy bruising or bleeding or hives Endoc:  No polyuria, polydipsia , polyphagia or weight change Other:  All other systems negative  Allergies:  Penicillin g  Physical Examination:  VS: BP 90/60 mmHg  Pulse 104  Wt 114 lb (51.71 kg)  SpO2 100%  General Appearance: No distress  HEENT: PERRLA, no ptosis, no other lesions noticed Pulmonary:normal breath sounds., diaphragmatic excursion normal.No wheezing, No rales   Cardiovascular:  Normal S1,S2.  No m/r/g.     Abdomen:Exam: Benign, Soft, non-tender, No masses  Skin:   warm, no rashes, no ecchymosis  Extremities: normal, no cyanosis, clubbing, warm with normal capillary refill.  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 See plan for cavitary lesion.  Spirometry 12/11/2014 FVC 1.34, 61% predicted FEV1 1.02, 53% predicted FEV1/FVC 76% actual, 88% predicted  Interpretation: Moderate to severe restriction and obstruction noted secondary to cavitary lung lesion  Plan: -See plan for cavitary lesion -Will consider repeating pulmonary function testing and 6 minute walk test in 3-4 months       Cavitary lesion of lung Left upper lobe cavitary lung lesion with significant hilar (bilateral) and maxillary lymphadenopathy. Differential diagnosis includes: Sarcoidosis, atypical lung infection, fungal infection (Aspergillus, blastomycosis,  histoplasmosis,), lymphoma, vasculitis  Patient with complex case of cavitary lung lesion, however given her clinical symptoms along with current results the working diagnosis at this time is cavitary lung lesion secondary to fungal infection with possible sarcoidosis. Patient is currently being treated with voriconazole and will be following with infectious disease. I have a high suspicion for sarcoid disease over fungal infection, I believe fungal infection is more opportunistic than the primary cause of her cavitary lung lesion.  Patient does have a normal ACE level, however specificity of using ACE level for diagnosis of sarcoid is very low, and a low ACE level can be seen in sarcoid patients; ACE level is used as more of a monitoring modality for treatment response with respect to sarcoid patients. For this patient prednisone 20 mg daily will be given for the first 6 weeks of treatment and then assess clinically Her fungal cultures from the BAL washings are currently positive for Aspergillus but speciation is still pending.  I have discussed the case with Dr. 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 Recent echo with a normal EF.  The above was discussed in detail with the patient, who is in agreement.  Patient has had her optometry exam and is currently wearing new prescription glasses.  Plan: -Prednisone 20 mg daily until follow-up -Continue followup with Dr. 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 She is currently on antifungal treatment, and given the high suspicion for sarcoidosis, patient will be placed on chronic steroids. Steroid use and is to be 3-6  months or longer given patient clinical response. The use of long-term prednisone was explained to the patient including its administration and side effects which could include or retention, abnormal calcium levels, steroid induced diabetes etc.  Plan: -Prednisone 20 mg daily until follow-up with pulmonary physician. -Referral to endocrinology for chronic steroid use.     Updated Medication List Outpatient Encounter Prescriptions as of 01/03/2015  Medication Sig  . acetaminophen (TYLENOL) 500 MG tablet Take 500 mg by mouth every 6 (six) hours as needed.  Marland Kitchen ADVAIR DISKUS 250-50 MCG/DOSE AEPB Inhale 250 mcg into the lungs as directed.  . benzonatate (TESSALON) 100 MG capsule Take 100 mg by mouth every 6 (six) hours as needed.  . citalopram (CELEXA) 20 MG tablet Take 20 mg by mouth at bedtime.  . fluticasone (FLONASE) 50 MCG/ACT nasal spray Place 2 sprays into both nostrils daily.  Marland Kitchen  guaiFENesin-codeine 100-10 MG/5ML syrup Take 5 mLs by mouth every 4 (four) hours as needed for cough.  . metoprolol tartrate (LOPRESSOR) 50 MG tablet Take 1 tablet (50 mg total) by mouth 2 (two) times daily.  . ondansetron (ZOFRAN) 4 MG tablet Take 4 mg by mouth every 6 (six) hours as needed.  . polyethylene glycol powder (GLYCOLAX/MIRALAX) powder Take 17 g by mouth as needed.  Marland Kitchen PROAIR HFA 108 (90 BASE) MCG/ACT inhaler Inhale 108 mcg into the lungs every 6 (six) hours as needed.  . voriconazole (VFEND) 200 MG tablet Take 200 mg by mouth 2 (two) times daily.  . predniSONE (DELTASONE) 20 MG tablet Take 1 tablet (20 mg total) by mouth daily.  . [DISCONTINUED] GUAIFENESIN AC 100-10 MG/5ML syrup Take 5 mLs by mouth every 8 (eight) hours as needed.    Orders for this visit: Orders Placed This Encounter  Procedures  . DG Chest 2 View    Standing Status: Future     Number of Occurrences:      Standing Expiration Date: 03/04/2016    Scheduling Instructions:     Schedule in 6 weeks    Order Specific Question:   Reason for Exam (SYMPTOM  OR DIAGNOSIS REQUIRED)    Answer:  cxr    Order Specific Question:  Is the patient pregnant?    Answer:  No    Order Specific Question:  Preferred imaging location?    Answer:  [facility]    Thank  you for the visitation and for allowing  [facility] to assist in the care of your patient. Our recommendations are noted above.  Please contact us if we can be of further service.  Stephanie Acre, MD [facility] Office Number: [phone]

## 2015-01-03 NOTE — Patient Instructions (Addendum)
Follow-up with Dr. Dema SeverinMungal in 6 weeks -We will treat you for having suspected sarcoidosis (pulmonary) -Prednisone 20 mg daily (one pill) - take with breakfast -Continue which a follow-up visit with Dr. Sampson GoonFitzgerald, and continue which antifungal treatments. -Chest x-ray, 2 view prior to follow-up -Follow-up with  [facility] due to being on chronic steroids.

## 2015-01-06 NOTE — Consult Note (Signed)
PATIENT NAME:  Jennifer [name], [name] MR#:  [id number] DATE OF BIRTH:  Sep 07, 1971  DATE OF CONSULTATION:  11/09/2014  REFERRING PHYSICIAN:   CONSULTING PHYSICIAN:  Bee Marchiano R. Sherrlyn HockPandit, MD  REFERRING PHYSICIAN: Dr. Elpidio AnisSudini.    CONSULTING PHYSICIAN: Dr. Sherrlyn HockPandit.   REASON FOR CONSULTATION: PET-positive lymphadenopathy, left lung cavitary lesion with pneumonia.   HISTORY OF PRESENT ILLNESS: The patient is a 44 year old African-American female with past medical history significant for asthma, Cesarean section x 2, who was admitted to the hospital on February 27 with complaints of persistent tachycardia and cough, cold symptoms, and chills of 3 days duration. Chest x-ray showed a large left upper lobe airspace opacity suspicious for multilobar pneumonia. The patient has been in hospital since then and is on antibiotics, currently on Vfend since bronchoscopy culture is growing fungus. CT scan of the chest on February 29 also reported left upper lobe consolidation with  large thick-walled cavitary lesion along with extensive mediastinal, bilateral hilar, supraclavicular, bilateral axillary lymphadenopathy. As stated above the patient underwent bronchoscopy on March 2 with cytology negative for malignant cells, there were reactive bronchial cells, inflammatory cells, and blood present. AFB smear was negative and culture is pending. Influenza test is negative. HIV test is negative. She had PET scan done earlier today which is again abnormal with hypermetabolic lymphadenopathy in the neck, chest, abdomen, and pelvis suspicious for metabolically active tumor. Also necrotic left upper lobe lesion was hypermetabolic, along with diffuse hypermetabolic marrow space. The patient continues to have feeling of low-grade fever, T-max earlier today morning was 100. She denies any chills. Oral intake is fair, states that she may have lost a few pounds lately. Denies any major pain issues. No known history of malignancy in the past. Her  mother had history of lung cancer and she has had secondhand smoke exposure from being around her. The patient herself is a nonsmoker otherwise.   PAST MEDICAL AND SURGICAL HISTORY: As in HPI above.   FAMILY HISTORY: Mother had breast cancer. Diabetes. Denies other malignancy.  SOCIAL HISTORY: The patient never smoked, but has history of secondhand smoke exposure. Denies alcohol or recreational drug usage. Has been physically active up until recent acute illness.   HOME MEDICATIONS: Levaquin 750 mg p.o. daily, Tylenol 1000 mg q. 6 hours p.r.n. for pain, codeine with guaifenesin 5 mL q. 4 hours p.r.n. for cough.   ALLERGIES: INCLUDE PENICILLIN.   REVIEW OF SYSTEMS:  CONSTITUTIONAL: As in HPI. Low-grade fevers. No chills. No night sweats.  HEENT: Denies any headaches, dizziness, epistaxis, ear or jaw pain. Mild sinus drainage intermittently.  CARDIAC: No angina, palpitation. No orthopnea or PND.  LUNGS: Has dyspnea on exertion and cough with scanty sputum. Denies hemoptysis. No major chest pain at this time.  GASTROINTESTINAL: No nausea, vomiting, or diarrhea. No blood in stools or melena.  GENITOURINARY: No dysuria or hematuria.  SKIN: No new rashes or pruritus.  HEMATOLOGIC: Denies any obvious bleeding issues.  NEUROLOGIC: No new focal weakness, seizures, or loss of consciousness.  MUSCULOSKELETAL: No new bone pains.  ENDOCRINE: No polyuria or polydipsia.   PHYSICAL EXAMINATION:  GENERAL: The patient is a moderately built, thin individual, sitting in bed, alert and oriented and converses appropriately. No acute distress. No icterus. Mild pallor.  VITAL SIGNS: T-max 100, T-current 98, 124, 18, 122/83, 94% on room air.  HEENT: Normocephalic, atraumatic. Extraocular movements intact. Sclerae anicteric.  NECK: Supple, no JVD. There is 1 enlarged lymph node palpable in left lower neck, around 1-1.5 cm.  Small  right lower cervical lymph node palpable.  CARDIOVASCULAR: S1, S2, regular,  tachycardic.  LUNGS: Diminished breath sounds left upper lobe area, no crepitations or rhonchi.  ABDOMEN: Soft, no hepatosplenomegaly clinically.  EXTREMITIES: No edema or cyanosis.  SKIN: No generalized rashes or major bruising.  LYMPHATICS: Bilateral axillary adenopathy, more prominent on the left side, measures 1-2 cm.  NEUROLOGIC: Grossly nonfocal, cranial nerves intact.  MUSCULOSKELETAL: No obvious joint redness or swelling.   LABORATORY RESULTS: Bronchoscopy washings fungus on culture, identification pending. AFB smear negative, culture pending. Blood culture negative. Creatinine 0.76. February 26-WBC 7.5, hemoglobin 11.3, platelets 244,000, ANC 5100, HIV negative, calcium 8.6.   IMPRESSION AND RECOMMENDATIONS: A 44 year old female patient admitted with progressive respiratory symptoms and found to have large thick-walled cavitary lesion in the left upper lobe lung along with surrounding opacifications and possible multilobar pneumonia, who has been on antibiotic and hospitalized since February 27, she continues to have low grade temperature up to 100.3. Bronchoscopy culture is growing fungus and she is on Vfend with identification and sensitivity pending. Otherwise the patient clinically has palpable lymphadenopathy in the neck, axillary, and inguinal areas and also PET scan done shows generalized hypermetabolic lymphadenopathy and left upper lobe lesion, all raising suspicion for possibility of malignancy versus other etiology like inflammatory or infectious etiology.  I have independently reviewed PET scan and discussed the findings with the patient and about possibility of this being malignancy versus other etiology.. Bronchoscopy cytology was negative for malignant cells. Given low grade fevers and lymphadenopathy, she definitely needs further workup to evaluate for possibility of lymphoma or other malignancy. Agree with hospitalist plan to request surgical biopsy of lymph node to look for  lymphoma. We will request serum LDH with a.m. labs. Will follow up after biopsy report is available. She is agreeable to this plan.   Thank you for the referral, please feel free to contact me if any additional questions.    ____________________________ Maren Reamer Sherrlyn Hock, MD srp:bu D: 11/09/2014 17:10:30 ET T: 11/09/2014 17:54:04 ET JOB#: [id number]  cc: Kazoua Gossen R. Sherrlyn Hock, MD, <Dictator> Wille Celeste MD ELECTRONICALLY SIGNED 11/11/2014 9:24

## 2015-01-06 NOTE — Discharge Summary (Signed)
PATIENT NAME:  Jennifer [name], Jennifer [name] MR#:  [id number] DATE OF BIRTH:  11/03/1970  DATE OF ADMISSION:  11/03/2014 DATE OF DISCHARGE:  11/13/2014  FINAL DIAGNOSES:  1.  Clinical sepsis, bilateral community-acquired pneumonia with cavitary lesions, turned out to be a fungal pneumonia.  2.  Diffuse lymphadenopathy, preliminary results show granulomatous disease and this could possibly be sarcoid.  3.  Tachycardia.   MEDICATIONS ON DISCHARGE: Include Tylenol 500 mg 2 tablets every 6 hours as needed for pain, metoprolol tartrate 25 mg twice a day, codeine guaifenesin 10/100 per 5 mL, 5 mL every 8 hours for 10 days as needed for cough. Tessalon Perles 100 mg every 6 hours as needed for cough, voriconazole 200 mg every 12 hours for 15 days.   HOME HEALTH: None.   OXYGEN: No.    DIET: Regular diet, regular consistency.   FOLLOWUP: With Dr. Dema Severin, pulmonary, 1 week, Dr. Sherrlyn Hock, hematology/oncology, 1 week, Dr. Sampson Goon, infectious disease, 2 weeks, 1 to 2 weeks with your primary care physician.     HOSPITAL COURSE: The patient was admitted 11/03/2014, discharged 11/13/2014. Please see interim summary dictated by Dr. Elpidio Anis  on March 4 for hospital course up until that point.   Laboratory and radiological data during the hospital course included an EKG, sinus tachycardia. Chest x-ray: Biapical airspace opacities, left greater than right, interstitial accenuation of the left perihilar airspace opacities. HIV test negative. Lipase normal. Urine pregnancy test negative. White count upon admission 7.5, hemoglobin 11.3, glucose 97, BUN 7, creatinine 0.81, sodium 135, potassium 4.2, chloride 104, CO2 22, calcium 8.6. Liver function test normal range. Urinalysis: Two plus blood.  Streptococcus pneumoniae antigen negative. Lactic acid 1.3. Hemoglobin A1c 4.9. Influenza negative. Blood cultures negative. CT scan of the chest with contrast showed highly unusual appearance of the chest, some imaging features suggestive  of a systemic disease such as sarcoidosis, large thick-walled cavitary in the left upper lobe, atypical infection, malignancy not excluded, extensive mediastinal bilateral hilar, supraclavicular, and bilateral axillary lymphadenopathy could be suggestive of lymphoma. The patient had a bronchoscopy done on March 2 that was negative for malignant cells. Bronchial washings: Culture showed a heavy growth of fungus. As per Dr. Sampson Goon likely Aspergillus. Acid-fast smear negative. PET scan done on March 4 was hypermetabolic lymphadenopathy neck, chest, abdomen, and pelvis consistent with metabolic active tumor. Large necrotic left upper lobe lesion is hypermetabolic. Diffuse hypermetabolic uptake in the marrow space consistent with bony involvement. Hepatitis C negative, hepatitis B surface antigen negative. FANA negative. LDH 146.  ACE level 64 which is normal range. Lymph node biopsy fungus culture negative and negative for infection. White count upon discharge 10.8, creatinine 0.72. Liver function test: AST slightly elevated at 38.   Hospital course per problem list:   1.  For the patient's clinical sepsis with pneumonia believed to be community-acquired, required a bronchoscopy. Fungus grew out of the culture, possibly Aspergillus as per Dr. Sampson Goon. The patient has completed course of antibiotic, now is on voriconazole for another 15 days post discharge. Will need to follow up with Dr. Sampson Goon prior to finishing antibiotic treatment. This may be a prolonged course of voriconazole if this is an Aspergillus. The patient still has cough and was given some cough suppressants upon going home.  2.  Diffuse lymphadenopathy. I spoke with the pathologist. Preliminary results looking at the film was granulomatous disease so the patient may actually have sarcoid. I did set up the patient with a followup appointment with Dr. Dema Severin. I spoke with  Dr. Belia HemanKasa who did not want to start steroids at this time with a fungal  infection in the lungs but this could be a possibility as outpatient. We will refer to Dr. Dema SeverinMungal.  3.  Tachycardia. Metoprolol prescribed 25 mg twice a day to keep heart rate under control, likely secondary to the lung infection.   TIME SPENT ON DISCHARGE: 35 minutes.     ____________________________ Herschell Dimesichard J. Renae GlossWieting, MD rjw:AT D: 11/13/2014 16:01:03 ET T: 11/14/2014 01:00:14 ET JOB#: [id number]  cc: Herschell Dimesichard J. Renae GlossWieting, MD, <Dictator> Sandeep R. Sherrlyn HockPandit, MD Stephanie AcreVishal Mungal, MD Stann Mainlandavid P. Sampson GoonFitzgerald, MD  Salley ScarletICHARD J Annisha Baar MD ELECTRONICALLY SIGNED 11/14/2014 10:57

## 2015-01-06 NOTE — Discharge Summary (Signed)
PATIENT NAME:  Jennifer [name], [name] MR#:  [id number] DATE OF BIRTH:  17-Nov-1970  DATE OF ADMISSION:  11/27/2014 DATE OF DISCHARGE:  11/28/2014  FINAL DIAGNOSES:  1.  Nausea, vomiting, likely from excessive coughing. Could also be from esophagitis.  2.  Fungal infection and cavitary lung lesion, left upper lobe, which is known from previous hospitalization.  3.  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  4.  Tachycardia.   MEDICATIONS ON DISCHARGE: Include Tylenol 500 mg 2 tablets every 6 hours as needed for pain; metoprolol tartrate 25 mg twice a day; benzoate 100 mg every 6 hours as needed for cough; voriconazole 200 mg every 12 hours, as per Dr. Sampson GoonFitzgerald; Zofran 4 mg every 6 hours as needed for nausea and vomiting; MiraLax 17 grams once a day as needed for constipation; Flonase nasal spray 2 sprays once a day both nostrils; omeprazole 20 mg twice a day.   FOLLOWUP: With Dr. Dema SeverinMungal, pulmonology; Dr. Sampson GoonFitzgerald, infectious disease; and follow up with your medical doctor in 2-4 weeks.   DIET: Regular consistency, regular small meals.   HOSPITAL COURSE: The patient was admitted as an observation March 22, and discharged March 23.  1.  Came in with nausea, vomiting up her medications. This happens in the evening when she is having coughing fits. I brought her in as an observation and got GI consultation. They did an upper endoscopy, which showed esophagitis. The patient was started on, Zantac in the hospital and a GI cocktail, at home omeprazole prescribed.  2.  For her fungal infections, I spoke with Dr. Sampson GoonFitzgerald. I will continue the Vfend for a possible sarcoidosis. I spoke with Dr. Dema SeverinMungal; he will consider steroids as outpatient, but wants at least a month worth of the Vfend first to see how she responds.  3.  Tachycardia on metoprolol.   LABORATORY AND RADIOLOGICAL DATA DURING THE HOSPITAL COURSE: A CT scan of the chest showed extensive parenchymal lung disease, minimally changed since 11/05/2014, a large cavitary  lesion left upper lobe, small left pleural effusion has resolved from the prior CT, diffuse lymphadenopathy. Chemistry within normal range. White count normal range. Hemoglobin 11.1, platelet count 318,000. Liver function tests normal range.   TIME SPENT ON DISCHARGE: 35 minutes.    ____________________________ Herschell Dimesichard J. Renae GlossWieting, MD rjw:bm D: 11/28/2014 14:56:53 ET T: 11/29/2014 03:44:21 ET JOB#: [id number]  cc: Herschell Dimesichard J. Renae GlossWieting, MD, <Dictator> Salley ScarletICHARD J Katanya Schlie MD ELECTRONICALLY SIGNED 11/29/2014 17:22

## 2015-01-06 NOTE — Consult Note (Signed)
PATIENT NAME:  Jennifer [name], Jennifer [name] MR#:  [id number] DATE OF BIRTH:  01-16-1971  DATE OF CONSULTATION:  11/09/2014  REFERRING PHYSICIAN:   CONSULTING PHYSICIAN:  Adah Salvageichard E. Excell Seltzerooper, MD  CHIEF COMPLAINT: Lymphadenopathy.   HISTORY OF PRESENT ILLNESS: This is a patient with diffuse lymphadenopathy. She was admitted to the  hospital with a diagnosis of community-acquired pneumonia, has been on isolation which has been removed since her AFB was negative on bronchoalveolar lavage.  She has had a recent bronchoscopy that grew aspergillus, but she is HIV negative.    The patient describes this persistent cough that started several days ago. She has been in the hospital for several days and her cough has been productive.   I was asked to see the patient for lymphadenopathy and a possible diagnosis of lymphoma as a PET scan was highly suggestive of lymphoma.   PAST MEDICAL HISTORY: Asthma.   PAST SURGICAL HISTORY: C-sections.   SOCIAL HISTORY: The patient is a nonsmoker and does not drink.   FAMILY HISTORY:  Diabetes.   REVIEW OF SYSTEMS: A complete system review was performed and negative with the exception of that mentioned in the HPI.    PHYSICAL EXAMINATION:  GENERAL:  Thin-appearing female patient, BMI of 27, 125 pounds.  HEENT: No scleral icterus.  NECK: No palpable neck nodes.  CHEST: Shows bilateral rhonchi.  CARDIAC: Regular rate and rhythm.  ABDOMEN: Soft, nontender.  EXTREMITIES: Without edema.  LYMPHATIC:  There are palpable lymph nodes mostly in the left axilla, has some symmetric in the right axilla. Minimally palpable nodes in both groins and in the supraclavicular fossas bilaterally, left greater than right.  NEUROLOGIC: Grossly intact.   LABORATORY DATA:  CT scan is personally reviewed from the 29th, showing diffuse lymphadenopathy in the neck, axilla, and the hilum. Most recent hemogram is from the 26th where her white blood cell count was 7.5 and H and H of 11 and 35 with a  platelet count of 244,000.   ASSESSMENT AND PLAN: This is a patient with diffuse lymphadenopathy. Her lymph nodes are not terribly enlarged, but PET scan it is highly suggestive of malignancy, and also she has bone marrow involvement. I am in agreement that lymph node biopsy can be performed. I believe the left axilla may be the easiest place to access the largest of the nodes, although the groin would be a safer and easier approach, but those nodes are smaller.   I first discussed with Dr. Forde Dandylney in pathology and then with Dr. Sampson GoonFitzgerald and Dr. Elpidio AnisSudini plans for lymph node biopsy. This being Friday afternoon and the patient has eaten it is unlikely that performing a lymph node biopsy at this point could be done effectively, and more importantly handling of lymph node tissue for the culture as requested by Dr. Sampson GoonFitzgerald (AFB and fungal cultures) as well as the need for flow cytometry cannot be performed expeditiously and safely on the weekend and the risk of losing the specimen is higher, therefore I will plan on prepping the patient and proceeding with surgery by my partner Dr. Juliann PulseLundquist on Monday morning. I will discuss this with him and I will discuss this with the patient in more detail.     ____________________________ Adah Salvageichard E. Excell Seltzerooper, MD rec:bu D: 11/09/2014 18:00:29 ET T: 11/09/2014 20:33:34 ET JOB#: [id number]  cc: Adah Salvageichard E. Excell Seltzerooper, MD, <Dictator> Lattie HawICHARD E Michele Judy MD ELECTRONICALLY SIGNED 11/10/2014 10:41

## 2015-01-06 NOTE — Op Note (Signed)
PATIENT NAME:  Jennifer [name], Jennifer [name] MR#:  [id number] DATE OF BIRTH:  1971-02-16  DATE OF PROCEDURE:  11/12/2014  ATTENDING PHYSICIAN: Cristal Deerhristopher A. Bela Nyborg, M.D.   PREOPERATIVE DIAGNOSIS: Lymphadenopathy.   POSTOPERATIVE DIAGNOSIS:  Lymphadenopathy.  PROCEDURE PERFORMED:  Axillary lymph node excision x2.   ANESTHESIA: General.   ESTIMATED BLOOD LOSS: 15 mL.   COMPLICATIONS: None.   SPECIMEN: Lymph nodes x2.   INDICATIONS FOR SURGERY:  [name] is a pleasant 44 year old who presented with diffuse, lymphadenopathy, we are concerned about lymphoma, is brought to the Operating Room for lymph node excision in the axilla.   DETAILS OF PROCEDURE:  As follows:  Informed consent was obtained.  [name] was brought to the Operating Room suite. She was induced. Endotracheal tube was placed. General anesthesia was administered. Her left axilla was prepped and draped in standard surgical fashion and a timeout was then performed, correctly identifying the patient name, operative site and procedure.  Next a transverse incision was made and was deepened down to the fascia, the axillary space was incised. I was able to extract a large  approximately 1.5 cm lymph node. This was sent for pathology.  I also found a smaller subcentimeter lymph node, it was excised and brought to pathology. I then irrigated; once irrigation, showed hemostasis complete a 3-0 deep dermal interrupted suture was used to close the skin and a 4-0 Monocryl subcuticular was used to close the epidermis. Dermabond was then placed over the wound. The patient was then awoken, extubated and brought to the postanesthesia care unit. There were no immediate complications. Needle, sponge, and instrument counts were correct at the end of the procedure.    ____________________________ Si Raiderhristopher A. Shanedra Lave, MD cal:at D: 11/12/2014 15:19:50 ET T: 11/12/2014 18:16:24 ET JOB#: [id number]  cc: Cristal Deerhristopher A. Lerry Cordrey, MD,  <Dictator> Jarvis NewcomerHRISTOPHER A Virgil Slinger MD ELECTRONICALLY SIGNED 11/13/2014 11:28

## 2015-01-06 NOTE — H&P (Signed)
PATIENT NAME:  Jennifer [name], Jennifer [name] MR#:  [id number] DATE OF BIRTH:  1971/02/12  DATE OF ADMISSION:  11/03/2014  REFERRING PHYSICIAN: Enedina Finner. Manson Passey, MD   PRIMARY CARE PHYSICIAN: Nonlocal.   ADMIT DIAGNOSIS: Persistent tachycardia and pneumonia.   HISTORY OF PRESENT ILLNESS: This is a 44 year old African American female who presents to the Emergency Room complaining of cough and shortness of breath. The patient states that her symptoms began with cold and chills 3 days ago. She believes that her asthma was acting up or that she was catching of virus, so she took some Tylenol that helped briefly. However, she began to have more shortness of breath and cough. She missed the next 2 days of work and has been feeling progressively weaker since that time. Her cough has been produced slightly productive of thick, white sputum. She admits to decreased appetite and some chest pain that occurs with her cough. In the Emergency Department, the patient was found not to be hypoxic, but had persistent tachycardia with heart rates more than 120 despite 3 L of fluid resuscitation, which prompted the Emergency Department staff to call for admission.   REVIEW OF SYSTEMS:  CONSTITUTIONAL: The patient denies fever, but admits to weakness.  EYES: Denies blurred vision or inflammation.  EARS, NOSE AND THROAT: Denies tinnitus or sore throat.  RESPIRATORY: Admits to coughing and shortness of breath.  CARDIOVASCULAR: Admits to chest pain with cough but denies palpitations, orthopnea, or paroxysmal nocturnal dyspnea.  GASTROINTESTINAL: The patient denies nausea, vomiting, or diarrhea.  GENITOURINARY: The patient denies dysuria, increased frequency, or hesitancy of urination. HEMATOLOGIC AND LYMPHATIC: Denies easy bruising or bleeding.  INTEGUMENTARY: Denies rashes or lesions.  ENDOCRINE: Denies polyuria or polydipsia.  MUSCULOSKELETAL: Denies myalgias or arthralgias.  NEUROLOGIC: Denies numbness in her extremities or  difficulty speaking.  PSYCHIATRIC: Denies depression or suicidal ideation.   PAST MEDICAL HISTORY: Asthma.   PAST SURGICAL HISTORY: Two cesarean sections.   SOCIAL HISTORY: The patient does not smoke, drink, and do any drugs. She lives with her fiance for the time being.   FAMILY HISTORY: Diabetes runs throughout multiple members of the family and her mother is deceased of lung cancer; she was a smoker.   MEDICATIONS:  1.  Codeine with guaifenesin 10 mg/100 mg per 5 mL every 4 hours as needed for coughing, nausea, and vomiting.  2.  Levaquin 750 mg 1 tablet p.o. every 24 hours.  3.  Tylenol 500 mg 2 tablets p.o. every 6 hours as needed for pain.   ALLERGIES: PENICILLIN.   PERTINENT LABORATORY RESULTS AND RADIOGRAPHIC FINDINGS: Serum glucose is 97, BUN 7, creatinine 0.81, serum sodium 135, potassium 4.2, chloride is 104, bicarbonate 22, calcium is 8.6. Lipase is 183, serum albumin is 2.8, alkaline phosphatase is 85, AST is 24 ALT is 23. White blood cell count is 7.5, hemoglobin is 11.3, hematocrit 35.4, platelet count 244,000, MCV is 79. Urinalysis is negative for infection but does show 2+ red blood cells. Urine pregnancy test is negative. Chest x-ray shows bilateral airspace opacities, left greater than right.   PHYSICAL EXAMINATION:  VITAL SIGNS: Temperature is 98.3, pulse is 109, respirations 18, blood pressure 107/68, pulse oximetry is 95% on room air.  GENERAL: The patient is alert and oriented x 3 in no apparent distress.  HEENT: Normocephalic, atraumatic. Pupils equal, round, and reactive to light and accommodation. Extraocular movements are intact. Mucous membranes are moist.  NECK: Trachea is midline. No adenopathy. Thyroid nonpalpable, nontender.  CHEST: Symmetric and atraumatic.  CARDIOVASCULAR: Tachycardic rate, normal rhythm. Normal S1, S2. No rubs, clicks, or murmurs.  LUNGS: There are rhonchi bilaterally that clear some with cough. The patient does not have any cacophony.  She has normal work of breathing.  ABDOMEN: Positive bowel sounds. Soft, nontender, nondistended. No hepatosplenomegaly.  GENITOURINARY: Deferred.  MUSCULOSKELETAL: The patient moves all 4 extremities equally. I have not tested her gait.  SKIN: Warm and dry. No rashes or lesions.  EXTREMITIES: No clubbing, cyanosis, or edema.  NEUROLOGIC: Cranial nerves II-XII are grossly intact.  PSYCHIATRIC: Mood is normal. Affect is congruent. The patient has excellent judgment and insight into her medical condition.   ASSESSMENT AND PLAN: This is a 44 year old female admitted for persistent tachycardia and pneumonia.  1.  Tachycardia, sinus and persistent. The patient has received 3 L of normal saline and we will continue to hydrate her. I have ordered a beta blocker for heart rate more than 110.  2.  Pneumonia. The patient has community-acquired pneumonia with no hypoxia. We will continue oral Levaquin as prescribed in the Emergency Department.  3.  Hyponatremia is mild. This is likely due to hypovolemia secondary to poor p.o. intake. We will continue to hydrate the patient as mentioned above.  4.  Asthma, unclear severity at this time, as the patient is not the best historian regarding her asthma symptoms. We will continue DuoNebs as needed for shortness of breath.  5.  Deep vein thrombosis prophylaxis. Lovenox.  6.  Gastrointestinal prophylaxis. None.   CODE STATUS: The patient is a full code.   TIME SPENT ON ADMISSION ORDERS AND PATIENT CARE: Approximately 35 minutes.    ____________________________ Kelton PillarMichael S. Sheryle Hailiamond, MD msd:bm D: 11/03/2014 04:24:00 ET T: 11/03/2014 05:38:08 ET JOB#: [id number]  cc: Kelton PillarMichael S. Sheryle Hailiamond, MD, <Dictator> Kelton PillarMICHAEL S Elgar Scoggins MD ELECTRONICALLY SIGNED 11/03/2014 19:37

## 2015-01-06 NOTE — Consult Note (Signed)
ONCOLOGY followup - feels better. No new cough or SOB. no fevers. Eating better.Sitting in bed, alert and oriented, no acute distress.            vitals - afebrile, stable.          lungs - bilateral good breath sounds          abd - soft, NT          ext - pedal edema  Hb 10.8,  WBC 10.8, platelets 360.  11/09/14 -  PET scan. IMPRESSION: Hypermetabolic lymphadenopathy in the neck, chest, abdomen, and pelvis consistent with metabolically active tumor. The large necrotic left upper lobe lesion is hypermetabolic as is scatteredparenchymal disease elsewhere in both lungs. Diffuse hypermetabolic uptake in the marrow space consistent with bony involvement by tumor.  Impression/Recommendations: 44 year old female patient admitted with progressive respiratory symptoms and found to have large thick-walled cavitary lesion in the left upper lobe lung along with surrounding opacifications and possible multilobar pneumonia, who has been on antibiotic and hospitalized since February 27. Bronchoscopy culture is growing fungus and she is on Vfend with identification and sensitivity pending. Otherwise the patient clinically has palpable lymphadenopathy in the neck, axillary, and inguinal areas and also PET scan done shows generalized hypermetabolic lymphadenopathy and left upper lobe lesion. She had surgical lymph node biopsy yesterday. 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 Given this, will sign off the case. Please re-consult if needed in the future. Patient explained above details also.      Electronic Signatures: Izola PricePandit, Casanova Schurman Raj (MD)  (Signed on 08-Mar-16 21:11)  Authored  Last Updated: 08-Mar-16 21:11 by Izola PricePandit, Sandhya Denherder Raj (MD)

## 2015-01-06 NOTE — Consult Note (Signed)
Pt seen and examined. Admitted with coughng as well as nausea/vomiting. 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 Pt feels that Abx, perhaps Vfend, was contributing to her nausea. Coughing makes her more nauseous. NO vomiting since last night. Does c/o indigestion at home. Placed on PPI on admission. To be discharged later today. Will plan EGD today to check for GERD or PUD. thanks.  Electronic Signatures: Lutricia Feilh, Noel Rodier (MD)  (Signed on 23-Mar-16 13:29)  Authored  Last Updated: 23-Mar-16 13:29 by Lutricia Feilh, Azir Muzyka (MD)

## 2015-01-06 NOTE — Consult Note (Signed)
Brief Consult Note: Diagnosis: lymphadenopathy.   Patient was seen by consultant.   Consult note dictated.   Recommend to proceed with surgery or procedure.   Discussed with Attending MD.   Comments: Discussed with Path Dept and with PD and ID. Preferable to do LN Bx when specimen can be properly processed for flow cytometry, fungal and mycobact cultures. Not optimal over the weekend. Will prep and schedule for Monday.  Electronic Signatures: Lattie Hawooper, Jamarion Jumonville E (MD)  (Signed 04-Mar-16 17:49)  Authored: Brief Consult Note   Last Updated: 04-Mar-16 17:49 by Lattie Hawooper, Demonie Kassa E (MD)

## 2015-01-06 NOTE — Consult Note (Signed)
EGD showed possible esophagitis. Bx's taken. Ok for discharge later today. Have her stay on daily PPI or H2 blocker at least until bx's results are back. thanks.    Electronic Signatures: Lutricia Feilh, Charolette Bultman (MD) (Signed on 23-Mar-16 14:00)  Authored   Last Updated: 23-Mar-16 14:02 by Lutricia Feilh, Rayden Scheper (MD)

## 2015-01-06 NOTE — H&P (Signed)
PATIENT NAME:  Jennifer [name], Jennifer [name] MR#:  [id number] DATE OF BIRTH:  [birth year]  DATE OF ADMISSION:  11/27/2014  PRIMARY CARE PHYSICIAN:   Dr Jacqlyn Larsen  CHIEF COMPLAINT: Nausea, vomiting.   HISTORY OF PRESENT ILLNESS: This is a 44 year old female who saw her primary care physician for the first time today. She got nervous and sent her into the ER. The patient complains of vomiting every evening. She has difficulty with her medications in the evening. She is short of breath. She is weak. She is coughing. She states that she vomits up her medications. She is also having some burning on some urination. She is not able to eat much, but able to tolerate Boost. She has been having some constipation. In the ER she had a normal white count. She was not dehydrated. CT scan of the chest does not show much change from previous imaging. Hospitalist services were contacted for further evaluation.   From last hospitalization, the patient has a cavitary left upper lung lesion, found to have sarcoidosis on biopsy. TB was ruled out. She has a fungal infection and is on Vfend as per Dr. Sampson Goon.   PAST MEDICAL HISTORY: Tachycardia, fungal infection of the lung, sarcoidosis, cough.   PAST SURGICAL HISTORY: C-section.   ALLERGIES: PENICILLIN.   MEDICATIONS AT HOME: Include Tessalon Perles 100 mg every 6 hours as needed for cough, codeine, guaifenesin 5 mL every 8 hours, Colace 100 mg twice a day, metoprolol tartrate 25 mg twice a day, Tylenol p.r.n., voriconazole 200 mg every 12 hours.   SOCIAL HISTORY: No smoking. No alcohol or drug use.   FAMILY HISTORY: Diabetes in the family. Mother died of lung cancer.   REVIEW OF SYSTEMS:  CONSTITUTIONAL: Positive for chills. Positive for sweats. No fever. Positive for weight loss. Positive for fatigue.  EYES: No blurry vision. EARS, NOSE, MOUTH, AND THROAT: The other day had a nosebleed.  CARDIOVASCULAR: Positive for chest pain.  RESPIRATORY: Positive for shortness of breath.  Positive for cough.  GASTROINTESTINAL: Positive for nausea. Positive for vomiting. No hematemesis. Positive for constipation. No abdominal pain.  GENITOURINARY: Burning on urination.  MUSCULOSKELETAL: No joint pain.  INTEGUMENT: No rashes or eruptions.  NEUROLOGIC: No fainting or blackouts.  PSYCHIATRIC: Positive her insomnia and anxiety.  ENDOCRINE: No thyroid problems.  HEMATOLOGIC AND LYMPHATIC: No anemia, no easy bruising, or bleeding.   PHYSICAL EXAMINATION: VITAL SIGNS: Temperature 98.5, pulse 95, respirations 18, blood pressure 102/66, pulse oximetry 98% on room air.  GENERAL: No respiratory distress.  EYES: Conjunctivae and lids normal. Pupils equal, round, and reactive to light. Extraocular muscles intact. No nystagmus.  EARS, NOSE, MOUTH, AND THROAT: Tympanic membranes, no erythema. Nasal mucosa, no erythema. Throat, no erythema, no exudate seen. Lips and gums, no lesions.  NECK: No JVD. No bruits. No lymphadenopathy. No thyromegaly. No thyroid nodules palpated.  RESPIRATORY:  Lungs clear to auscultation. No use of accessory muscles to breathe. No rhonchi, rales, or wheeze heard.  CARDIOVASCULAR: S1, S2 normal. No gallops, rubs, or murmurs heard. Carotid upstroke 2+ press bilaterally. No bruits. Dorsalis pedis pulses 2+ bilaterally. No edema of the lower extremity.  ABDOMEN: Soft, nontender. No organomegaly/splenomegaly. Normoactive bowel sounds. No mass felt.  LYMPHATIC: No lymph nodes in the neck.  MUSCULOSKELETAL: No clubbing, edema, cyanosis.  SKIN: No rashes or ulcers seen.  NEUROLOGIC: Cranial nerves II-XII grossly intact. Deep tendon reflexes 2+ bilateral lower extremities.  PSYCHIATRIC: The patient is oriented to person, place, and time.   LABORATORY AND RADIOLOGICAL DATA: Cliffton Asters  blood count 4.4, H and H, 11.1 and 34.2, platelet count of 318,000, glucose 138, BUN 5, creatinine 0.63, sodium 139, potassium 4.1, chloride 100, CO2 of 30, calcium 9.1. Liver function tests normal  range. CT scan of the chest showed extensive parenchymal lung disease, minimally changed, large cavitary lesion left upper lobe, diffuse lymphadenopathy.   ASSESSMENT AND PLAN: 1.  Nausea, vomiting. Will admit as an observation. I will get a gastroenterology consult to see if they want to do an endoscopy. I will start IV Zantac, give a gastrointestinal cocktail. Likely will prescribe home on proton pump inhibitor. The patient must eat 8 small meals during the day. She is able to tolerate her medications in the morning. Unclear why she is just unable to tolerate them in the evening.  2.  Fungal infection on the lung. Continue Vfend. We will get Dr. Sampson GoonFitzgerald to evaluate the patient.   3.  Tachycardia. On metoprolol. 4.  Sarcoidosis, follows with Dr. Dema SeverinMungal as outpatient. He will consider when to start steroids.   We will continue to monitor her overnight here. We will see if gastroenterology wants to do an endoscopy or not in the a.m. and then will see where to go from here. Hopefully, we will be able to discharge later on tomorrow.  TIME SPENT ON ADMISSION: 60 minutes.   ____________________________ Herschell Dimesichard J. Renae GlossWieting, MD rjw:LT D: 11/27/2014 18:32:06 ET T: 11/27/2014 19:36:36 ET JOB#: [id number]  cc: Herschell Dimesichard J. Renae GlossWieting, MD, <Dictator> Dr. Ria ClockSaul Vishal Mungal, MD Stann Mainlandavid P. Sampson GoonFitzgerald, MD  Salley ScarletICHARD J Margaux Engen MD ELECTRONICALLY SIGNED 11/29/2014 17:21

## 2015-01-06 NOTE — Consult Note (Signed)
PATIENT NAME:  Jennifer [name], Gayathri MR#:  [id number] DATE OF BIRTH:  03/29/1971  DATE OF CONSULTATION:  11/28/2014  REFERRING PHYSICIAN:   CONSULTING PHYSICIAN:  Ezzard StandingPaul Y. Malary Aylesworth, MD  REASON FOR REFERRAL: Nausea and vomiting.   DESCRIPTION: The patient is a 44 year old black female who was admitted through the Emergency Room because of bouts of nausea and vomiting. She was recently hospitalized and found to have sarcoidosis with a fungal infection of the lungs. She has been on Vfend ever since. According to her, whenever she takes the medicine she feels nauseous and vomits afterwards. She has also described having some indigestion issues recently. She continues to cough. The reason I was asked to see the patient was because of persistent nausea and vomiting to the point that she could not keep anything down. Fortunately for her, the vomiting stopped last night. She still is coughing. I was asked to do an endoscopy before she was discharged to make sure there is no other causes of nausea and vomiting.   She has tachycardia. She has a fungal infection of lung and sarcoidosis.   PAST SURGICAL HISTORY: C-section.   ALLERGIES: PENICILLIN.   HOME MEDICATIONS: She was taking Tessalon Perles, guaifenesin, Colace, metoprolol 25 mg twice a day for tachycardia and voriconazole 20 mg every 12 hours.   SOCIAL HISTORY: She denies tobacco or alcohol use.   FAMILY HISTORY: Notable for diabetes and lung cancer.   REVIEW OF SYSTEMS: There is really no changes from initial admission. Please refer to this. She was having fevers and chills and sweats, and shortness of breath and coughing. Fortunately, her oxygen saturation has been stable throughout.   PHYSICAL EXAMINATION: GENERAL: Right now, she is in no acute distress.    VITAL SIGNS: She is afebrile. Vital signs are stable. She is tachycardic, though, with heart rate in the 120s.  HEAD AND NECK: Within normal limits.  CARDIAC: Regular rhythm and rate.  LUNGS:  Clear.  ABDOMEN: Soft and nontender with active bowel sounds. No hepatomegaly.  EXTREMITIES: No clubbing, cyanosis, or edema.   LABORATORY DATA: White count is 4.4, hemoglobin 11.1. Electrolytes are normal. Liver enzymes are normal, except for albumin slightly low at 2.9. CT scan of the chest continues to show this large cavitary lesion in the left upper lobe, for which she is getting an antifungal agent. There is really no change from the previous CT scan that was done.   ASSESSMENT AND PLAN: This is a patient with nausea and vomiting, which has gotten better. Some of it may be related to the medication she was taking for her fungal infection. Some of it may be related to her coughing. Nevertheless, since she is, n.p.o., we will proceed with upper endoscopy later today to check for other causes of nausea and vomiting, such as peptic ulcer disease or gastroesophageal reflux disease. The patient should be stable to be discharged to home after the EGD.   Thank you for the referral.     ____________________________ Ezzard StandingPaul Y. Bluford Kaufmannh, MD pyo:mw D: 11/28/2014 15:17:31 ET T: 11/28/2014 16:00:40 ET JOB#: [id number]  cc: Ezzard StandingPaul Y. Bluford Kaufmannh, MD, <Dictator> Wallace CullensPAUL Y Xzaviar Maloof MD ELECTRONICALLY SIGNED 11/28/2014 16:48

## 2015-01-06 NOTE — Consult Note (Signed)
PATIENT NAME:  Jennifer [name], SLIMP MR#:  [id number] DATE OF BIRTH:  [birth year]  DATE OF CONSULTATION:  11/05/2014  REQUESTING PHYSICIAN:  Srikar R. Sudini, MD CONSULTING PHYSICIAN:  Stann Mainland. Sampson Goon, MD  REASON FOR CONSULTATION:  Abnormal CT scan, pneumonia, lymphadenopathy.   HISTORY OF PRESENT ILLNESS: This is a pleasant 44 year old female who was admitted February 27 with fevers, cough for several days, as well as chills. She reports that she has had a chronic cough for approximately 8 years. She has had 1 other ER visit, she reports for  pneumonia about 2 years ago. She thinks she has asthma, but is not followed closely for this. For this admission, she developed severe chills over the last several days and her cough became worse. When she was admitted, she had an abnormal chest x-ray and was tachycardic to 120. She was treated with levofloxacin as well and IV fluids. CT scan was done and it shows marked abnormalities with cavitary lesions and other findings. We are consulted for further antibiotic management.   The patient works at [employer]. She lives with her boyfriend, who is chronically ill as well, and has had a recent admission at [facility] for pneumonia. She reports that he has mold in his house. She is lifelong nonsmoker and denies other substance abuse. She has 2 grown children who live in [place] and 1 is in the [employer]. She denies any known TB exposure. She reports having a TB test several years ago, which was negative. Prior to this acute illness over the last several days, she denied any night sweats, recurrent fevers, or weight loss.   PAST MEDICAL HISTORY: 1.  Chronic cough.  2.  Asthma.   PAST SURGICAL HISTORY: C-section.   SOCIAL HISTORY: Does not smoke, drink, or do drugs. Lives with her boyfriend, who is chronically ill, works at [employer].   FAMILY HISTORY: Positive for diabetes. Her mother died of lung cancer in her 2s.   ALLERGIES: PENICILLIN.   ANTIBIOTICS  SINCE ADMISSION: Include levofloxacin. She also received a dose of meropenem and azithromycin.  PHYSICAL EXAMINATION:  VITAL SIGNS:   Temperature 97.4, pulse 131, blood pressure 135/75, respirations 18, saturation 96% on room air.  GENERAL: She is pleasant, interactive. She is very slow in her responses and seems to have a hard time with recall.  HEENT: Pupils are reactive. Sclerae are anicteric. Oropharynx is clear with no thrush. NECK: Supple. She does have impressive shotty supraclavicular, submandibular, and anterior cervical lymph nodes. These are nontender and mobile.  HEART: Tachycardic.  LUNGS: Relatively clear, but somewhat coarse bilaterally. There is no obvious wheeze or crackles.  ABDOMEN: Soft, nontender, nondistended. No hepatosplenomegaly.  EXTREMITIES: No clubbing, cyanosis, or edema.  NEUROLOGIC: Alert and oriented, somewhat slowed mentation.   DATA: White blood count on admission 7.5, hemoglobin 11.3, platelets 244,000, lymphocyte count was 0.9. Comprehensive panel normal except albumin somewhat low at 2.8. Lipase was normal. Urinalysis was negative. Flu testing was negative. Pregnancy test was negative. Lactic acid 1.3. TSH normal. Hemoglobin A1c of 4.9.   Chest x-ray showed persistent pulmonary infiltrates with question of cavitation and lung abscess formation in the left upper lobe, 0.75 cm. CT scanning done February 29 showed impressive findings including multiple borderline enlarged and enlarged mediastinal and hilar lymph nodes. There was also bilateral axillary lymphadenopathy, left greater than right. There were multiple enlarged supraclavicular lymph nodes. Her lungs had extensive multifocal thickening of the peribronchovascular interstitium with extensive multifocal peribronchovascular micro and micronodularity, most evident  in the mid to upper lungs bilaterally with relative sparing of the lung bases. In the left upper lobe, there was a large thick-walled cavity measuring  4.5 x 3.8 x 4.9 cm, upward retraction of hilar structures. Small left pleural effusion layering independently. The upper abdomen had multiple borderline enlarged and mildly enlarged retroperitoneal lymph nodes, largest of 1 cm.   IMPRESSION: A 44 year old female admitted with cough and pneumonia, found to have a markedly abnormal CT scan. She has multiple lymph nodes that are enlarged and cavitary lesion and nodularity in her mid and upper lung lobes. She denies any tuberculosis contact. She reports having a negative PPD done several years ago. She has not had a recent HIV test. Her boyfriend has had a recent pneumonia as well. She is concerned about some mold in his house.   RECOMMENDATIONS: 1.  Check HIV test.  2.  Isolation, airborne isolation.  3.  Check sputum for routine as well as AFB x 3.  4.  Consult pulmonary and consider bronchoscopy for biopsy and cultures including fungal, routine and AFB.  5.  Can discontinue azithromycin and meropenem and continue back on the levofloxacin to which he obviously seemed to respond.   Thank you for the consult. I will be glad to follow with you.    ____________________________ Stann Mainlandavid P. Sampson GoonFitzgerald, MD dpf:LT D: 11/05/2014 15:03:52 ET T: 11/05/2014 17:29:41 ET JOB#: [id number]  cc: Stann Mainlandavid P. Sampson GoonFitzgerald, MD, <Dictator> Orlandria Kissner Sampson GoonFITZGERALD MD ELECTRONICALLY SIGNED 11/08/2014 19:55

## 2015-01-16 ENCOUNTER — Telehealth: Payer: Self-pay | Admitting: Internal Medicine

## 2015-01-16 NOTE — Telephone Encounter (Signed)
Pt returning call.Jennifer [name] ° °

## 2015-01-16 NOTE — Telephone Encounter (Signed)
Called and spoke to pt. Pt requesting to know what she is to do since the [employer] is no longer paying her antifungal medication. Advised pt to contact Dr. Sampson GoonFitzgerald as instructed by VM. Pt verbalized understanding and denied any further questions or concerns at this time.

## 2015-01-16 NOTE — Telephone Encounter (Signed)
LMTCB

## 2015-02-12 ENCOUNTER — Ambulatory Visit (INDEPENDENT_AMBULATORY_CARE_PROVIDER_SITE_OTHER): Payer: Managed Care, Other (non HMO) | Admitting: Internal Medicine

## 2015-02-12 ENCOUNTER — Ambulatory Visit
Admission: RE | Admit: 2015-02-12 | Discharge: 2015-02-12 | Disposition: A | Discharge status: Home / Self Care | Payer: Medicaid Other | Source: Ambulatory Visit | Attending: Internal Medicine | Admitting: Internal Medicine

## 2015-02-12 ENCOUNTER — Encounter: Payer: Self-pay | Admitting: Internal Medicine

## 2015-02-12 VITALS — BP 102/60 | HR 93 | Temp 98.3°F | Ht 59.0 in | Wt 115.0 lb

## 2015-02-12 DIAGNOSIS — R059 Cough, unspecified: Secondary | ICD-10-CM

## 2015-02-12 DIAGNOSIS — J984 Other disorders of lung: Secondary | ICD-10-CM | POA: Insufficient documentation

## 2015-02-12 DIAGNOSIS — R05 Cough: Secondary | ICD-10-CM | POA: Insufficient documentation

## 2015-02-12 DIAGNOSIS — R0600 Dyspnea, unspecified: Secondary | ICD-10-CM | POA: Diagnosis not present

## 2015-02-12 DIAGNOSIS — R0689 Other abnormalities of breathing: Secondary | ICD-10-CM | POA: Diagnosis not present

## 2015-02-12 MED ORDER — PREDNISONE 20 MG PO TABS: 20.0000 mg | ORAL_TABLET | Freq: Every day | ORAL | Status: DC

## 2015-02-12 NOTE — Assessment & Plan Note (Addendum)
Left upper lobe cavitary lung lesion with significant hilar (bilateral) and maxillary lymphadenopathy. 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 Patient is currently being treated with voriconazole and will be following with infectious disease. I have a high suspicion for sarcoid disease over fungal infection, I believe fungal infection is more opportunistic than the primary cause of her cavitary lung lesion.  Patient does have a normal ACE level, however specificity of using ACE level for diagnosis of sarcoid is very low, and a low ACE level can be seen in sarcoid patients; ACE level is used as more of a monitoring modality for treatment response with respect to sarcoid patients. For this patient prednisone 20 mg daily will be given for the first 6 weeks of treatment and then assess clinically Her fungal cultures from the BAL washings are currently positive for Aspergillus but speciation is still pending.  I have discussed the case with Dr. 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 Recent echo with a normal EF.  The above was discussed in detail with the patient, who is in agreement.  Patient has had her optometry exam and is currently wearing new prescription glasses.  Today patient with great improvement in her respiratory status, cxr with improvement (chronic LUL opacifaction) Will plan to continue with Prednisone 20mg  for another 4-6 weeks before starting the wean process.   Plan: -Prednisone 20 mg daily until follow-up - Continue with  Antifungal treatment by Infectious Disease and followup with Dr. Sampson GoonFitzgerald (infectious disease)

## 2015-02-12 NOTE — Assessment & Plan Note (Signed)
Now with great improvement Cough secondary to cavitary lung lesion, possible fungal infection from cavitary lung lesion, suspected sarcoidosis  Plan: -Supportive care and management as outlined for cavitary lung lesion

## 2015-02-12 NOTE — Progress Notes (Signed)
MRN# [id number] Jennifer [name] [birth year]   CC: Chief Complaint  Patient presents with  . Follow-up    Pt has not been using Advair; she forgot about it.Pt here for f/u cough which has improved. She had her cxr today. She is having pain in her rt leg, feels like it's asleep. She still gets light headed.     Brief History: Synopsis: 44 year old female with left upper lobe cavitary lesion, history of bilateral pneumonia, currently being followed by pulmonary and infectious disease. Cavitary lesion differential at this time includes fungal infection versus sarcoidosis. Currently being treated as fungal infection by infectious disease, if no clinical response in the next 2-3 weeks and steroids will be initiated.  History of present illness:Patient is a pleasant 44 year old female is presenting today for hospital followup of cavitary lung lesion, bilateral pneumonia, evaluation for sarcoid. Briefly, patient was having increased shortness of breath and chest tightness and presented to the ED on February 27 found to have bilateral pneumonia chest x-ray subsequent CT showed a large left upper lobe cavitary lesion, she was followed by pulmonary, Dr. Belia Heman, Hematology\Oncology (Dr. Sherrlyn Hock) and surgery (Dr. Sharmon Revere). Her prehospitalization course is described with recurrent upper respiratory tract infections, dyspnea on exertion, cough, fatigue, 30 pound weight loss. Prior to hospitalization she also endorsed fever and chills accompanied with night sweats. She is a never smoker and currently works in [employer] at [employer]. Today patient states that she still having significant amount of cough, productive sputum (which is thick and white), vomiting after cough, shortness of breath on exertion, blurry vision. Patient states that she's had blurred vision for at least a year. She denies any skin lesions of tender nodules on her lower extremities. Prior to hospitalization she does endorse intermittent  episodes of nausea and vomiting accompanied with shortness of breath (as stated above). Patient also noted to have a PET CT done during the hospitalization that showed significant hypermetabolic activity in the axillary lymph nodes, this was followed by left axillary lymph node dissection, samples were sent for culture and pathology review. Her differential diagnosis during hospitalization include fungal pneumonia, sarcoidosis, lymphoma-or a combination of any one of these. Off note, patient states that she was treated multiple times in the last 3-4 months with antibiotics and steroids; each time after steroid use her symptoms drastically improved. Plan: Continue treating as fungal cavitary lesion with voriconazole, echocardiogram, followup with optometry, cough suppression.   ROV 12/20/14: She presents today for a followup visit of a left upper lobe cavitary lesion. Since her last visit she has followed with infectious disease is currently treating her with voriconazole. Current Aspergillus cultures are still pending from the state lab. Patient states that she's also had a recent admission to [facility] on March 22 2 11/28/2014. He presented to the hospital with nausea and vomiting from excessive coughing and was treated with Zofran, and supportive care. Today patient states she still has some coughing, but it is actually improving. She still endorses cough with shortness of breath and productive sputum (thick white at times).  Today she tried a full pulmonary function testing, was able to follow directions accurately, this was converted to a spirometry. She also attempted a 6 minute walk test, but noted to have moderate dizziness and leg weakness and during the 6 minutes only walk about 66 m/216 feet, she was also noted to be walking very slowly. 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 Since her last visit her cough has improved, but is still prevalent, her shortness of breath has improved but is still impacting daily activities. Today she is accompanied by her sister. I have discussed this case with Dr. 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 This was explained to the patient, we will start steroid therapy today. Plan: Prednisone  daily.   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 Since her last visit her cough has improved, she has also been started on steroids, with great improvement in her respiratory status.   Today she states that she is able to walk further without significant sob.  Still has a mild productive cough, mainly with intense exertion and rapid weather changes. Patient states that she has not been using her Advair regularly, due to forgetting at times.     Medication:   Current Outpatient Rx  Name  Route  Sig  Dispense  Refill  . acetaminophen (TYLENOL) 500 MG tablet   Oral   Take 500 mg by mouth every 6 (six) hours as needed.         . benzonatate (TESSALON) 100 MG capsule   Oral   Take 100 mg by mouth every 6 (six) hours as needed.      0   . citalopram (CELEXA) 20 MG tablet   Oral   Take 20 mg by mouth at bedtime.      0   . fluticasone (FLONASE) 50 MCG/ACT nasal spray   Each Nare   Place 2 sprays into both nostrils daily.      0   . guaiFENesin-codeine 100-10 MG/5ML syrup   Oral   Take 5 mLs by mouth every 4 (four) hours as needed for cough.   120 mL   1   . metoprolol tartrate (LOPRESSOR) 50 MG tablet   Oral   Take 1 tablet (50 mg total) by mouth 2 (two) times daily.   30 tablet   6   . ondansetron (ZOFRAN) 4 MG tablet   Oral   Take 4 mg by mouth every 6 (six) hours as needed.      0   .  polyethylene glycol powder (GLYCOLAX/MIRALAX) powder   Oral   Take 17 g by mouth as needed.      0   . predniSONE (DELTASONE) 20 MG tablet   Oral   Take 1 tablet (20 mg total) by mouth daily.   50 tablet   0   . PROAIR HFA 108 (90 BASE) MCG/ACT inhaler   Inhalation   Inhale 108 mcg into the lungs every 6 (six) hours as needed.           Dispense as written.   . voriconazole (VFEND) 200 MG tablet   Oral   Take 200 mg by mouth 2 (two) times daily.         Marland Kitchen ADVAIR DISKUS 250-50 MCG/DOSE AEPB   Inhalation   Inhale 250 mcg into the lungs as directed.           Dispense as written.      Review of Systems: Gen:  Denies  fever, sweats, chills HEENT: Denies blurred vision, double vision, ear pain, eye pain, hearing loss, nose bleeds, sore throat Cvc:  No dizziness, chest pain or heaviness Resp:   Admits  to: Gi: Denies swallowing difficulty, stomach pain, nausea or vomiting, diarrhea, constipation, bowel incontinence Gu:  Denies bladder incontinence, burning urine Ext:   No Joint pain, stiffness or swelling Skin: No skin rash, easy bruising or bleeding or hives Endoc:  No polyuria, polydipsia , polyphagia or weight change Other:  All other systems negative  Allergies:  Penicillin g  Physical Examination:  VS: BP 102/60 mmHg  Pulse 93  Temp(Src) 98.3 F (36.8 C) (Oral)  Ht 4\' 11"  (1.499 m)  Wt 115 lb (52.164 kg)  BMI 23.21 kg/m2  SpO2 97%  LMP 02/06/2015  General Appearance: No distress  HEENT: PERRLA, no ptosis, no other lesions noticed Pulmonary:mild expiratory wheeze in the LUL, good breath sounds throughout.  diaphragmatic excursion normal. No rales   Cardiovascular:  Normal S1,S2.  No m/r/g.     Abdomen:Exam: Benign, Soft, non-tender, No masses  Skin:   warm, no rashes, no ecchymosis  Extremities: normal, no cyanosis, clubbing, warm with normal capillary refill.      Rad results: (The following images and results were reviewed by Dr. Dema Severin). CXR  02/12/15 Decreased density around cavitary lesion in the left upper lobe, which is not completely visualized for measurement purposes. No internal debris seen. Right upper lobe interstitial opacity is stable. Normal heart size. Stable mediastinal contours. No edema, effusion, or pneumothorax. No acute osseous findings.  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 Opacification has decreased on the left since 11/27/2014, compatible with treatment response.    Assessment and Plan:44 yo with suspected sarcoidosis and LUL cavitary lesion.  Cough Now with great improvement Cough secondary to cavitary lung lesion, possible fungal infection from cavitary lung lesion, suspected sarcoidosis  Plan: -Supportive care and management as outlined for cavitary lung lesion     Cavitary lesion of lung Left upper lobe cavitary lung lesion with significant hilar (bilateral) and maxillary lymphadenopathy. 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 Patient is currently being treated with voriconazole and will be following with infectious disease. I have a high suspicion for sarcoid disease over fungal infection, I believe fungal infection is more opportunistic than the primary cause of her cavitary lung lesion.  Patient does have a normal ACE level, however specificity of using ACE level for diagnosis of sarcoid is very low, and a low ACE level can be seen in sarcoid patients; ACE level is used as more of a monitoring modality for treatment response with respect to sarcoid patients. For this patient prednisone 20 mg daily will be given for the first 6 weeks  of treatment and then assess clinically Her fungal cultures from the BAL washings are currently positive for Aspergillus but speciation is still pending.  I have discussed the case with Dr. 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 Recent echo with a normal EF.  The above was discussed in detail with the patient, who is in agreement.  Patient has had her optometry exam and is currently wearing new prescription glasses.  Today patient with great improvement in her respiratory status, cxr with improvement (chronic LUL opacifaction) Will plan to continue with Prednisone 20mg  for another 4-6 weeks before starting the wean process.  Plan: -Prednisone 20 mg daily until follow-up - Continue with Antifungal treatment by Infectious Disease and followup with Dr. Sampson GoonFitzgerald (infectious disease)        Dyspnea and respiratory abnormality Shortness of breath and cough secondary to cavitary lung lesion, deconditioning, chronic illness over the past 3 or 4 months. See plan for cavitary lesion.  Spirometry 12/11/2014 FVC 1.34, 61% predicted FEV1 1.02, 53% predicted FEV1/FVC 76% actual, 88% predicted  Interpretation: Moderate to severe restriction and obstruction noted secondary to cavitary lung lesion  Plan: -See plan for cavitary lesion -Will consider repeating pulmonary function testing and 6 minute walk test in 3-4 months           Updated Medication List Outpatient Encounter Prescriptions as of 02/12/2015  Medication Sig  . acetaminophen (TYLENOL) 500 MG tablet Take 500 mg by mouth every 6 (six) hours as needed.  . benzonatate (TESSALON) 100 MG capsule Take 100 mg by mouth every 6 (six) hours as needed.  . citalopram (CELEXA) 20 MG tablet Take 20 mg by mouth at bedtime.  . fluticasone (FLONASE) 50 MCG/ACT nasal spray Place 2 sprays into both nostrils daily.  Marland Kitchen.  guaiFENesin-codeine 100-10 MG/5ML syrup Take 5 mLs by mouth every 4 (four) hours as needed for cough.  . metoprolol tartrate (LOPRESSOR) 50 MG tablet Take 1 tablet (50 mg total) by mouth 2 (two) times daily.  . ondansetron (ZOFRAN) 4 MG tablet Take 4 mg by mouth every 6 (six) hours as needed.  . polyethylene glycol powder (GLYCOLAX/MIRALAX) powder Take 17 g by mouth as needed.  . predniSONE (DELTASONE) 20 MG tablet Take 1 tablet (20 mg total) by mouth daily.  Marland Kitchen. PROAIR HFA 108 (90 BASE) MCG/ACT inhaler Inhale 108 mcg into the lungs every 6 (six) hours as needed.  . voriconazole (VFEND) 200 MG tablet Take 200 mg by mouth 2 (two) times daily.  . [DISCONTINUED] predniSONE (DELTASONE) 20 MG tablet Take 1 tablet (20 mg total) by mouth daily.  Marland Kitchen. ADVAIR DISKUS 250-50 MCG/DOSE AEPB Inhale 250 mcg into the lungs as directed.   No facility-administered encounter medications on file as of 02/12/2015.    Orders for this visit: No orders of the defined types were placed in this encounter.    Thank  you for the visitation and for allowing  [facility] to assist in the care of your patient. Our recommendations are noted above.  Please contact us if we can be of further service.  Stephanie AcreVishal Levonia Wolfley, MD [facility] Office Number: [phone]

## 2015-02-12 NOTE — Assessment & Plan Note (Signed)
Shortness of breath and cough secondary to cavitary lung lesion, deconditioning, chronic illness over the past 3 or 4 months. See plan for cavitary lesion.  Spirometry 12/11/2014 FVC 1.34, 61% predicted FEV1 1.02, 53% predicted FEV1/FVC 76% actual, 88% predicted  Interpretation: Moderate to severe restriction and obstruction noted secondary to cavitary lung lesion  Plan: -See plan for cavitary lesion -Will consider repeating pulmonary function testing and 6 minute walk test in 3-4 months

## 2015-02-12 NOTE — Patient Instructions (Addendum)
Follow-up with Dr. Dema SeverinMungal in 6 weeks -We will continue treat you for having suspected sarcoidosis (pulmonary) -Prednisone 20 mg daily (one pill) - take with breakfast -Continue with a follow-up visit with Dr. Sampson GoonFitzgerald, and continue which antifungal treatments. -Continue with follow-up with [facility] as scheduled due to being on chronic steroids.  - Please use your Advair as directed - 1 puff in the AM and 1 puff in the PM -please gargle and rinse after each use.

## 2015-02-14 ENCOUNTER — Ambulatory Visit
Admission: RE | Admit: 2015-02-14 | Discharge: 2015-02-14 | Disposition: A | Discharge status: Home / Self Care | Payer: Medicaid Other | Source: Ambulatory Visit | Attending: Family Medicine | Admitting: Family Medicine

## 2015-02-14 ENCOUNTER — Other Ambulatory Visit: Payer: Self-pay | Admitting: Family Medicine

## 2015-02-14 ENCOUNTER — Ambulatory Visit
Admission: RE | Admit: 2015-02-14 | Discharge: 2015-02-14 | Disposition: A | Discharge status: Home / Self Care | Payer: Medicaid Other | Source: Ambulatory Visit | Attending: *Deleted | Admitting: *Deleted

## 2015-02-14 DIAGNOSIS — M79604 Pain in right leg: Secondary | ICD-10-CM

## 2015-02-14 DIAGNOSIS — M25551 Pain in right hip: Secondary | ICD-10-CM

## 2015-02-28 ENCOUNTER — Telehealth: Payer: Self-pay | Admitting: Internal Medicine

## 2015-02-28 ENCOUNTER — Other Ambulatory Visit: Payer: Self-pay | Admitting: Internal Medicine

## 2015-02-28 MED ORDER — PREDNISONE 20 MG PO TABS: 20.0000 mg | ORAL_TABLET | Freq: Every day | ORAL | Status: DC

## 2015-02-28 NOTE — Telephone Encounter (Signed)
Spoke with pt. She had refill sent into [facility] at her last visit for the Prednisone. Pt states she had forgot she [place] to send it there and will go there to pick it up. Nothing further needed.

## 2015-02-28 NOTE — Telephone Encounter (Signed)
Spoke with pt. She needs her RX for prednisone sent to [facility]. I have done so. Nothing further needed

## 2015-03-25 ENCOUNTER — Ambulatory Visit: Payer: Medicaid Other | Admitting: Internal Medicine

## 2015-03-27 ENCOUNTER — Ambulatory Visit: Payer: Medicaid Other | Admitting: Internal Medicine

## 2015-03-27 ENCOUNTER — Ambulatory Visit (INDEPENDENT_AMBULATORY_CARE_PROVIDER_SITE_OTHER): Payer: Medicaid Other | Admitting: Internal Medicine

## 2015-03-27 ENCOUNTER — Encounter: Payer: Self-pay | Admitting: Internal Medicine

## 2015-03-27 VITALS — BP 100/60 | HR 90 | Temp 97.8°F | Ht 59.0 in | Wt 121.0 lb

## 2015-03-27 DIAGNOSIS — R05 Cough: Secondary | ICD-10-CM

## 2015-03-27 DIAGNOSIS — J984 Other disorders of lung: Secondary | ICD-10-CM | POA: Diagnosis not present

## 2015-03-27 DIAGNOSIS — R059 Cough, unspecified: Secondary | ICD-10-CM

## 2015-03-27 MED ORDER — PREDNISONE 5 MG PO TABS: 15.0000 mg | ORAL_TABLET | Freq: Every day | ORAL | Status: DC

## 2015-03-27 MED ORDER — ADVAIR DISKUS 250-50 MCG/DOSE IN AEPB: 1.0000 | INHALATION_SPRAY | RESPIRATORY_TRACT | Status: DC

## 2015-03-27 NOTE — Assessment & Plan Note (Signed)
Initially with great improvement since initiation of both prednisone and voriconazole, however since voriconazole has been acutely stopped cough is starting to return. I'm concerned that inadequate treatment of her current fungal pulmonary infection may cause it to rebound. Cough secondary to cavitary lung lesion, possible fungal infection from cavitary lung lesion, suspected sarcoidosis  Plan: -Supportive care and management as outlined for cavitary lung lesion

## 2015-03-27 NOTE — Assessment & Plan Note (Signed)
Left upper lobe cavitary lung lesion with significant hilar (bilateral) and maxillary lymphadenopathy. 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 Patient is currently being treated with voriconazole and will be following with infectious disease. I still  have a high suspicion for sarcoid disease over fungal infection, I believe fungal infection is more opportunistic than the primary cause of her cavitary lung lesion.  Patient does have a normal ACE level, however specificity of using ACE level for diagnosis of sarcoid is very low, and a low ACE level can be seen in sarcoid patients; ACE level is used as more of a monitoring modality for treatment response with respect to sarcoid patients.  Her fungal cultures from the BAL washings are currently positive for Aspergillus but speciation is still pending.  I have discussed the case with Dr. 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 Recent echo with a normal EF.  The above was discussed in detail with the patient, who is in agreement.  Patient has had her optometry exam and is currently wearing new prescription glasses.  Patient with good improvement in her respiratory status, however since stopping voriconazole acutely secondary to insurance issues, she started to have backache cough, I'm concerned that inadequate treatment of her underlying fungal pulmonary infection can cause her to rebound, and she'll benefit from complete treatment with voriconazole. I will discuss with Dr. Sampson GoonFitzgerald about getting  authorization for her voriconazole. In the meanwhile will continue with prednisone 20 mg daily until 04/07/2015, and then wean down to 15 mg of prednisone starting August 1 until a follow-up visit.   Plan: - Prednisone 20 mg daily until 04/07/2015, start prednisone 15 mg August 1, 1 tab daily until follow-up - Continue with Antifungal treatment by Infectious Disease and followup with Dr. Sampson GoonFitzgerald (infectious disease), discussed with Dr. Sampson GoonFitzgerald about getting authorization for voriconazole from insurance company

## 2015-03-27 NOTE — Patient Instructions (Signed)
Follow up with Dr. Dema SeverinMungal in 6 weeks - continue with prednisone 20mg  unitl 04/07/15, then starting 04/08/15 start Prednisone 15mg  daily until your follow up visit - please contact Dr. Sampson GoonFitzgerald office to get authorization for your antifungal medication (voriconazole).  - cont with diet and exercise.

## 2015-03-27 NOTE — Progress Notes (Signed)
MRN# [id number] Guy Toney [birth year]   CC: Chief Complaint  Patient presents with  . Follow-up    Pt has been off VFend 2-3 weeks due to need PA. Pt says she feels better but can feel cough coming back.      Brief History: Synopsis: 44 year old female with left upper lobe cavitary lesion, history of bilateral pneumonia, currently being followed by pulmonary and infectious disease. Cavitary lesion differential at this time includes fungal infection versus sarcoidosis. Currently being treated as fungal infection by infectious disease, if no clinical response in the next 2-3 weeks and steroids will be initiated.  History of present illness:Patient is a pleasant 44 year old female is presenting today for hospital followup of cavitary lung lesion, bilateral pneumonia, evaluation for sarcoid. Briefly, patient was having increased shortness of breath and chest tightness and presented to the ED on February 27 found to have bilateral pneumonia chest x-ray subsequent CT showed a large left upper lobe cavitary lesion, she was followed by pulmonary, Dr. Belia Heman, Hematology\Oncology (Dr. Sherrlyn Hock) and surgery (Dr. Sharmon Revere). Her prehospitalization course is described with recurrent upper respiratory tract infections, dyspnea on exertion, cough, fatigue, 30 pound weight loss. Prior to hospitalization she also endorsed fever and chills accompanied with night sweats. She is a never smoker and currently works in [employer] at [employer]. Today patient states that she still having significant amount of cough, productive sputum (which is thick and white), vomiting after cough, shortness of breath on exertion, blurry vision. Patient states that she's had blurred vision for at least a year. She denies any skin lesions of tender nodules on her lower extremities. Prior to hospitalization she does endorse intermittent episodes of nausea and vomiting accompanied with shortness of breath (as stated  above). Patient also noted to have a PET CT done during the hospitalization that showed significant hypermetabolic activity in the axillary lymph nodes, this was followed by left axillary lymph node dissection, samples were sent for culture and pathology review. Her differential diagnosis during hospitalization include fungal pneumonia, sarcoidosis, lymphoma-or a combination of any one of these. Off note, patient states that she was treated multiple times in the last 3-4 months with antibiotics and steroids; each time after steroid use her symptoms drastically improved. Plan: Continue treating as fungal cavitary lesion with voriconazole, echocardiogram, followup with optometry, cough suppression.   ROV 12/20/14: She presents today for a followup visit of a left upper lobe cavitary lesion. Since her last visit she has followed with infectious disease is currently treating her with voriconazole. Current Aspergillus cultures are still pending from the state lab. Patient states that she's also had a recent admission to [facility] on March 22 2 11/28/2014. He presented to the hospital with nausea and vomiting from excessive coughing and was treated with Zofran, and supportive care. Today patient states she still has some coughing, but it is actually improving. She still endorses cough with shortness of breath and productive sputum (thick white at times).  Today she tried a full pulmonary function testing, was able to follow directions accurately, this was converted to a spirometry. She also attempted a 6 minute walk test, but noted to have moderate dizziness and leg weakness and during the 6 minutes only walk about 66 m/216 feet, she was also noted to be walking very slowly. 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 Since her last visit her cough has improved,  but  is still prevalent, her shortness of breath has improved but is still impacting daily activities. Today she is accompanied by her sister. I have discussed this case with Dr. 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 This was explained to the patient, we will start steroid therapy today. Plan: Prednisone 20mg  daily.   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 Since her last visit her cough has improved, she has also been started on steroids, with great improvement in her respiratory status.  Today she states that she is able to walk further without significant sob. Still has a mild productive cough, mainly with intense exertion and rapid weather changes. Patient states that she has not been using her Advair regularly, due to forgetting at times.  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 Since her last visit, she was having some right leg pain, went to the ED, got RLE u\s which was negative for DVT.  She has been off Voriconazole for the past 2 week, due to running out of medications and her insurance requiring a prior authorization to continue it.  Patient tells a that since being off voriconazole she's had a mild cough that is productive with clear sputum. She is also still having some minor right-sided leg pain for which she is using a cane to walk with. Otherwise, patient has good clinical improvement from a respiratory standpoint except for this mild cough that has been restarted since being off of voriconazole. Today patient does have a cane for right leg discomfort with prolonged use.   Medication:   Current Outpatient Rx  Name  Route  Sig   Dispense  Refill  . acetaminophen (TYLENOL) 500 MG tablet   Oral   Take 500 mg by mouth every 6 (six) hours as needed.         Marland Kitchen ADVAIR DISKUS 250-50 MCG/DOSE AEPB   Inhalation   Inhale 250 mcg into the lungs as directed.           Dispense as written.   . benzonatate (TESSALON) 100 MG capsule   Oral   Take 100 mg by mouth every 6 (six) hours as needed.      0   . citalopram (CELEXA) 20 MG tablet   Oral   Take 20 mg by mouth at bedtime.      0   . fluticasone (FLONASE) 50 MCG/ACT nasal spray   Each Nare   Place 2 sprays into both nostrils daily.      0   . guaiFENesin-codeine 100-10 MG/5ML syrup   Oral   Take 5 mLs by mouth every 4 (four) hours as needed for cough.   120 mL   1   . metoprolol tartrate (LOPRESSOR) 50 MG tablet   Oral   Take 1 tablet (50 mg total) by mouth 2 (two) times daily.   30 tablet   6   . polyethylene glycol powder (GLYCOLAX/MIRALAX) powder   Oral   Take 17 g by mouth as needed.      0   . predniSONE (DELTASONE) 20 MG tablet   Oral   Take 1 tablet (20 mg total) by mouth daily.   50 tablet   0   .  PROAIR HFA 108 (90 BASE) MCG/ACT inhaler   Inhalation   Inhale 108 mcg into the lungs every 6 (six) hours as needed.           Dispense as written.   . gabapentin (NEURONTIN) 100 MG capsule   Oral   Take 100 mg by mouth 2 (two) times daily.      0   . ondansetron (ZOFRAN) 4 MG tablet   Oral   Take 4 mg by mouth every 6 (six) hours as needed.      0   . voriconazole (VFEND) 200 MG tablet   Oral   Take 200 mg by mouth 2 (two) times daily.            Review of Systems: Gen:  Denies  fever, sweats, chills HEENT: Denies blurred vision, double vision, ear pain, eye pain, hearing loss, nose bleeds, sore throat Cvc:  No dizziness, chest pain or heaviness Resp:   Admits to: Cough with productive clear sputum Gi: Denies swallowing difficulty, stomach pain, nausea or vomiting, diarrhea, constipation, bowel incontinence Gu:   Denies bladder incontinence, burning urine Ext:   No Joint pain, stiffness or swelling Skin: No skin rash, easy bruising or bleeding or hives Endoc:  No polyuria, polydipsia , polyphagia or weight change Other:  All other systems negative  Allergies:  Penicillin g  Physical Examination:  VS: BP 100/60 mmHg  Pulse 90  Temp(Src) 97.8 F (36.6 C) (Oral)  Ht 4\' 11"  (1.499 m)  Wt 121 lb (54.885 kg)  BMI 24.43 kg/m2  SpO2 100%  General Appearance: No distress  HEENT: PERRLA, no ptosis, no other lesions noticed Pulmonary:normal breath sounds., diaphragmatic excursion normal.No wheezing, No rales   Cardiovascular:  Normal S1,S2.  No m/r/g.     Abdomen:Exam: Benign, Soft, non-tender, No masses  Skin:   warm, no rashes, no ecchymosis  Extremities: normal, no cyanosis, clubbing, warm with normal capillary refill.      Rad results: (The following images and results were reviewed by Dr. Dema Severin). 02/14/15 Right LOWER EXTREMITY VENOUS DOPPLER ULTRASOUND  TECHNIQUE: Gray-scale sonography with graded compression, as well as color Doppler and duplex ultrasound were performed to evaluate the lower extremity deep venous systems from the level of the common femoral vein and including the common femoral, femoral, profunda femoral, popliteal and calf veins including the posterior tibial, peroneal and gastrocnemius veins when visible. The superficial great saphenous vein was also interrogated. Spectral Doppler was utilized to evaluate flow at rest and with distal augmentation maneuvers in the common femoral, femoral and popliteal veins.  COMPARISON: None.  FINDINGS: Contralateral Common Femoral Vein: Respiratory phasicity is normal and symmetric with the symptomatic side. No evidence of thrombus. Normal compressibility.  Common Femoral Vein: No evidence of thrombus. Normal compressibility, respiratory phasicity and response to augmentation.  Saphenofemoral Junction: No evidence of  thrombus. Normal compressibility and flow on color Doppler imaging.  Profunda Femoral Vein: No evidence of thrombus. Normal compressibility and flow on color Doppler imaging.  Femoral Vein: No evidence of thrombus. Normal compressibility, respiratory phasicity and response to augmentation.  Popliteal Vein: No evidence of thrombus. Normal compressibility, respiratory phasicity and response to augmentation.  Calf Veins: No evidence of thrombus. Normal compressibility and flow on color Doppler imaging.  Superficial Great Saphenous Vein: No evidence of thrombus. Normal compressibility and flow on color Doppler imaging.  Venous Reflux: None.  Other Findings: None.  IMPRESSION: No evidence of deep venous thrombosis on the right.    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 I'm concerned that inadequate treatment of her current fungal pulmonary infection may cause it to rebound. 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 Patient is currently being treated with voriconazole and will be following with infectious  disease. I still  have a high suspicion for sarcoid disease over fungal infection, I believe fungal infection is more opportunistic than the primary cause of her cavitary lung lesion.  Patient does have a normal ACE level, however specificity of using ACE level for diagnosis of sarcoid is very low, and a low ACE level can be seen in sarcoid patients; ACE level is used as more of a monitoring modality for treatment response with respect to sarcoid patients.  Her fungal cultures from the BAL washings are currently positive for Aspergillus but speciation is still pending.  I have discussed the case with Dr. 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 Recent echo with a normal EF.  The above was discussed in detail with the patient, who is in agreement.  Patient has had her optometry exam and is currently wearing new prescription glasses.  Patient with good improvement in her respiratory status, however since stopping voriconazole acutely secondary to insurance issues, she started to have backache cough, I'm concerned that inadequate treatment of her underlying fungal pulmonary infection can cause her to rebound, and she'll benefit from complete treatment with voriconazole. I will discuss with Dr. Sampson Goon about getting authorization for her voriconazole. In the meanwhile will continue with prednisone 20 mg daily until 04/07/2015, and then wean down to 15 mg of prednisone starting August 1 until a follow-up visit.   Plan: - Prednisone 20 mg daily until 04/07/2015, start prednisone 15 mg August 1, 1 tab daily until follow-up - Continue with Antifungal treatment by Infectious Disease and followup with Dr. Sampson Goon (infectious disease), discussed with Dr. Sampson Goon about getting authorization for voriconazole from insurance company          Updated Medication List Outpatient  Encounter Prescriptions as of 03/27/2015  Medication Sig  . acetaminophen (TYLENOL) 500 MG tablet Take 500 mg by mouth every 6 (six) hours as needed.  Marland Kitchen ADVAIR DISKUS 250-50 MCG/DOSE AEPB Inhale 250 mcg into the lungs as directed.  . benzonatate (TESSALON) 100 MG capsule Take 100 mg by mouth every 6 (six) hours as needed.  . citalopram (CELEXA) 20 MG tablet Take 20 mg by mouth at bedtime.  . fluticasone (FLONASE) 50 MCG/ACT nasal spray Place 2 sprays into both nostrils daily.  Marland Kitchen guaiFENesin-codeine 100-10 MG/5ML syrup Take 5 mLs by mouth every 4 (four) hours as needed for cough.  . metoprolol tartrate (LOPRESSOR) 50 MG tablet Take 1 tablet (50 mg total) by mouth 2 (two) times  daily.  . polyethylene glycol powder (GLYCOLAX/MIRALAX) powder Take 17 g by mouth as needed.  . predniSONE (DELTASONE) 20 MG tablet Take 1 tablet (20 mg total) by mouth daily.  Marland Kitchen. PROAIR HFA 108 (90 BASE) MCG/ACT inhaler Inhale 108 mcg into the lungs every 6 (six) hours as needed.  . gabapentin (NEURONTIN) 100 MG capsule Take 100 mg by mouth 2 (two) times daily.  . ondansetron (ZOFRAN) 4 MG tablet Take 4 mg by mouth every 6 (six) hours as needed.  . voriconazole (VFEND) 200 MG tablet Take 200 mg by mouth 2 (two) times daily.   No facility-administered encounter medications on file as of 03/27/2015.    Orders for this visit: No orders of the defined types were placed in this encounter.    Thank  you for the visitation and for allowing  [facility] to assist in the care of your patient. Our recommendations are noted above.  Please contact us if we can be of further service.  Stephanie AcreVishal Avrie Kedzierski, MD [facility] Office Number: [phone]

## 2015-04-15 LAB — CBC WITH DIFFERENTIAL/PLATELET
BASOS PCT: 1 %
Basophil #: 0 10*3/uL (ref 0.0–0.1)
EOS ABS: 0.1 10*3/uL (ref 0.0–0.7)
EOS PCT: 4.1 %
HCT: 33.1 % — AB (ref 35.0–47.0)
HGB: 10.4 g/dL — AB (ref 12.0–16.0)
LYMPHS ABS: 0.6 10*3/uL — AB (ref 1.0–3.6)
Lymphocyte %: 17.5 %
MCH: 24.2 pg — ABNORMAL LOW (ref 26.0–34.0)
MCHC: 31.3 g/dL — AB (ref 32.0–36.0)
MCV: 77 fL — ABNORMAL LOW (ref 80–100)
MONOS PCT: 20.8 %
Monocyte #: 0.7 x10 3/mm (ref 0.2–0.9)
Neutrophil #: 2 10*3/uL (ref 1.4–6.5)
Neutrophil %: 56.6 %
PLATELETS: 293 10*3/uL (ref 150–440)
RBC: 4.29 10*6/uL (ref 3.80–5.20)
RDW: 13.4 % (ref 11.5–14.5)
WBC: 3.5 10*3/uL — ABNORMAL LOW (ref 3.6–11.0)

## 2015-04-15 LAB — BASIC METABOLIC PANEL
ANION GAP: 7 (ref 7–16)
CHLORIDE: 104 mmol/L
CO2: 28 mmol/L
Calcium, Total: 8.7 mg/dL — ABNORMAL LOW
Creatinine: 0.67 mg/dL
EGFR (African American): >60
EGFR (Non-African Amer.): >60
GLUCOSE: 102 mg/dL — AB
Potassium: 4 mmol/L
Sodium: 139 mmol/L

## 2015-04-15 LAB — URINALYSIS, COMPLETE
BILIRUBIN, UR: NEGATIVE
GLUCOSE, UR: NEGATIVE mg/dL (ref 0–75)
KETONE: NEGATIVE
Leukocyte Esterase: NEGATIVE
Nitrite: NEGATIVE
PH: 7 (ref 4.5–8.0)
PROTEIN: NEGATIVE
Specific Gravity: 1.005 (ref 1.003–1.030)

## 2015-05-08 ENCOUNTER — Encounter: Payer: Self-pay | Admitting: Internal Medicine

## 2015-05-08 ENCOUNTER — Ambulatory Visit (INDEPENDENT_AMBULATORY_CARE_PROVIDER_SITE_OTHER): Payer: Medicaid Other | Admitting: Internal Medicine

## 2015-05-08 VITALS — BP 100/60 | HR 72 | Wt 122.0 lb

## 2015-05-08 DIAGNOSIS — R05 Cough: Secondary | ICD-10-CM | POA: Diagnosis not present

## 2015-05-08 DIAGNOSIS — J984 Other disorders of lung: Secondary | ICD-10-CM | POA: Diagnosis not present

## 2015-05-08 DIAGNOSIS — R059 Cough, unspecified: Secondary | ICD-10-CM

## 2015-05-08 NOTE — Assessment & Plan Note (Signed)
Initially with great improvement since initiation of both prednisone and voriconazole, however since voriconazole has been acutely stopped cough is starting to return. Voriconazole restarted, initially helped cough, but now persistent. Possible Sarcoid could be adding to the cough.  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

## 2015-05-08 NOTE — Addendum Note (Signed)
Addended by: Meyer Cory R on: 05/08/2015 01:06 PM   Modules accepted: Orders

## 2015-05-08 NOTE — Assessment & Plan Note (Signed)
Left upper lobe cavitary lung lesion with significant hilar (bilateral) and maxillary lymphadenopathy. 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 Patient is currently being treated with voriconazole and will be following with infectious disease. I still  have a high suspicion for sarcoid disease over fungal infection, I believe fungal infection is more opportunistic than the primary cause of her cavitary lung lesion.  Patient does have a normal ACE level, however specificity of using ACE level for diagnosis of sarcoid is very low, and a low ACE level can be seen in sarcoid patients; ACE level is used as more of a monitoring modality for treatment response with respect to sarcoid patients.  Her fungal cultures from the BAL washings are currently positive for Aspergillus but speciation is still pending.  I have discussed the case with Dr. 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 Recent echo with a normal EF.  The above was discussed in detail with the patient, who is in agreement.  Patient has had her optometry exam and is currently wearing new prescription glasses.  Patient with good improvement in her respiratory status, however now with persistent cough. Her voriconazole was restarted in late July.   Plan: - Prednisone wean to  starting 05/09/15, if cough starts to get worst may need to go back to last effective dose ( ) and wean slowly down.  - Continue with Antifungal treatment by Infectious Disease and followup with Dr. Sampson Goon  (infectious disease).

## 2015-05-08 NOTE — Progress Notes (Signed)
[facility] Pulmonary Medicine Consultation      MRN# [id number] Jennifer [name] [birth year]   CC: Chief Complaint  Patient presents with  . Follow-up    f/u sarcoid; coughing at night; SOB;       Brief History: Synopsis: 44 year old female with left upper lobe cavitary lesion, history of bilateral pneumonia, currently being followed by pulmonary and infectious disease. Cavitary lesion differential at this time includes fungal infection versus sarcoidosis. 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   ROV 12/20/14: She presents today for a followup visit of a left upper lobe cavitary lesion. Since her last visit she has followed with infectious disease is currently treating her with voriconazole. Current Aspergillus cultures are still pending from the state lab. Patient states that she's also had a recent admission to [facility] on March 22 2 11/28/2014. He presented to the hospital with nausea and vomiting from excessive coughing and was treated with Zofran, and supportive care. Today patient states she still has some coughing, but it is actually improving. She still endorses cough with shortness of breath and productive sputum (thick white at times).  Today she tried a full pulmonary function testing, was able to follow directions accurately, this was converted to a spirometry. She also attempted a 6 minute walk test, but noted to have moderate dizziness and leg weakness and during the 6 minutes only walk about 66 m/216 feet, she was also noted to be walking very slowly. 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 Since her last visit her cough has improved, but is still prevalent, her shortness of breath has improved but is still impacting daily activities. Today she is  accompanied by her sister. I have discussed this case with Dr. 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 This was explained to the patient, we will start steroid therapy today. Plan: Prednisone 20mg  daily.   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 Since her last visit her cough has improved, she has also been started on steroids, with great improvement in her respiratory status.  Today she states that she is able to walk further without significant sob. Still has a mild productive cough, mainly with intense exertion and rapid weather changes. Patient states that she has not been using her Advair regularly, due to forgetting at times.  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 Since her last visit, she was having some right leg pain, went to the ED, got RLE u\s which was negative for DVT.  She has been off Voriconazole for the past 2 week, due to running out of medications and her insurance requiring a prior authorization to continue it.  Patient tells a that since being off voriconazole she's had a mild cough that is productive with clear sputum. She is also still having some minor right-sided leg pain for which she is using a cane to walk with. Otherwise, patient has good clinical improvement from a respiratory standpoint except for this mild cough that has been restarted since being off of voriconazole. Today patient does have a cane for right  leg discomfort with prolonged use.  Plan: - Prednisone 20 mg daily until 04/07/2015, start prednisone 15 mg August 1, 1 tab daily until follow-up - Continue with Antifungal treatment by Infectious Disease and followup with  Dr. Sampson Goon (infectious disease), discussed with Dr. 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 She tells me today that she cannot lay flat, due to having sob, and choking sensation. She is currently following with Dr. Sampson Goon, who has continued her voriconazole.  Today she is accompanied by her Social Haematologist Drenda Freeze).      Medication:   Current Outpatient Rx  Name  Route  Sig  Dispense  Refill  . acetaminophen (TYLENOL) 500 MG tablet   Oral   Take 500 mg by mouth every 6 (six) hours as needed.         Marland Kitchen ADVAIR DISKUS 250-50 MCG/DOSE AEPB   Inhalation   Inhale 1 puff into the lungs as directed.   60 each   2     Dispense as written.   . benzonatate (TESSALON) 100 MG capsule   Oral   Take 100 mg by mouth every 6 (six) hours as needed.      0   . citalopram (CELEXA) 20 MG tablet   Oral   Take 20 mg by mouth at bedtime.      0   . fluticasone (FLONASE) 50 MCG/ACT nasal spray   Each Nare   Place 2 sprays into both nostrils daily.      0   . guaiFENesin-codeine 100-10 MG/5ML syrup   Oral   Take 5 mLs by mouth every 4 (four) hours as needed for cough.   120 mL   1   . metoprolol tartrate (LOPRESSOR) 50 MG tablet   Oral   Take 1 tablet (50 mg total) by mouth 2 (two) times daily.   30 tablet   6   . ondansetron (ZOFRAN) 4 MG tablet   Oral   Take 4 mg by mouth every 6 (six) hours as needed.      0   . polyethylene glycol powder (GLYCOLAX/MIRALAX) powder   Oral   Take 17 g by mouth as needed.      0   . predniSONE (DELTASONE) 5 MG tablet   Oral   Take 3 tablets (15 mg total) by mouth daily with breakfast.   93 tablet   0   . PROAIR HFA 108 (90 BASE) MCG/ACT inhaler   Inhalation   Inhale 108 mcg into the lungs every 6 (six)  hours as needed.           Dispense as written.   . voriconazole (VFEND) 200 MG tablet   Oral   Take 200 mg by mouth 2 (two) times daily.            Review of Systems  Constitutional: Negative for fever, chills, weight loss and malaise/fatigue.  HENT: Negative for congestion, nosebleeds and sore throat.   Eyes: Negative.  Negative for blurred vision, double vision and photophobia.  Respiratory: Positive for cough. Negative for sputum production, shortness of breath, wheezing and stridor.        Chronic cough, not worst  Cardiovascular: Negative for chest pain and palpitations.  Gastrointestinal: Positive for heartburn. Negative for nausea, vomiting and abdominal pain.  Genitourinary: Negative.   Musculoskeletal:  Negative.   Skin: Negative for itching and rash.  Neurological: Negative.  Negative for headaches.  Endo/Heme/Allergies: Negative.   Psychiatric/Behavioral: Negative.       Allergies:  Penicillin g  Physical Examination:  VS: BP 100/60 mmHg  Pulse 72  Wt 122 lb (55.339 kg)  SpO2 100%  General Appearance: No distress  HEENT: PERRLA, no ptosis, no other lesions noticed Pulmonary:normal breath sounds., diaphragmatic excursion normal.No wheezing, No rales   Cardiovascular:  Normal S1,S2.  No m/r/g.     Abdomen:Exam: Benign, Soft, non-tender, No masses  Skin:   warm, no rashes, no ecchymosis  Extremities: normal, no cyanosis, clubbing, warm with normal capillary refill.     Assessment and Plan: Cough Initially with great improvement since initiation of both prednisone and voriconazole, however since voriconazole has been acutely stopped cough is starting to return. Voriconazole restarted, initially helped cough, but now persistent. Possible Sarcoid could be adding to the cough.  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        Cavitary lesion of lung Left upper lobe cavitary lung lesion with significant hilar (bilateral) and maxillary lymphadenopathy. 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 Patient is currently being treated with voriconazole and will be following with infectious disease. I still  have a high suspicion for sarcoid disease over fungal infection, I believe fungal infection is more opportunistic than the primary cause of her cavitary lung lesion.  Patient does have a normal ACE level, however specificity of using ACE level for diagnosis of sarcoid is very low, and a low ACE level can be seen in sarcoid patients; ACE level is used as more of a monitoring modality for treatment response with respect to sarcoid patients.  Her fungal cultures from the BAL washings are currently positive for Aspergillus but speciation is still pending.  I have discussed the case with Dr. 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 Recent echo with a normal EF.  The above was discussed in detail with the patient, who is in agreement.  Patient has had her optometry exam and is currently wearing new prescription glasses.  Patient with good improvement in her respiratory status, however now with persistent cough. Her voriconazole was restarted in late July.   Plan: - Prednisone wean to 10mg  starting 05/09/15, if cough starts to get worst may need to go back to last effective dose (20mg ) and wean  slowly down.  - Continue with Antifungal treatment by Infectious Disease and followup with Dr. Sampson Goon (infectious disease).           Updated Medication List Outpatient Encounter Prescriptions as of 05/08/2015  Medication Sig  . acetaminophen (TYLENOL) 500 MG tablet Take 500 mg by mouth every 6 (six) hours as needed.  Marland Kitchen ADVAIR DISKUS 250-50 MCG/DOSE AEPB Inhale 1 puff into the lungs as directed.  . benzonatate (TESSALON) 100 MG capsule Take 100 mg by mouth every 6 (six) hours as needed.  . citalopram (CELEXA) 20 MG tablet Take  20 mg by mouth at bedtime.  . fluticasone (FLONASE) 50 MCG/ACT nasal spray Place 2 sprays into both nostrils daily.  Marland Kitchen guaiFENesin-codeine 100-10 MG/5ML syrup Take 5 mLs by mouth every 4 (four) hours as needed for cough.  . metoprolol tartrate (LOPRESSOR) 50 MG tablet Take 1 tablet (50 mg total) by mouth 2 (two) times daily.  . ondansetron (ZOFRAN) 4 MG tablet Take 4 mg by mouth every 6 (six) hours as needed.  . polyethylene glycol powder (GLYCOLAX/MIRALAX) powder Take 17 g by mouth as needed.  . predniSONE (DELTASONE) 5 MG tablet Take 3 tablets (15 mg total) by mouth daily with breakfast.  . PROAIR HFA 108 (90 BASE) MCG/ACT inhaler Inhale 108 mcg into the lungs every 6 (six) hours as needed.  . voriconazole (VFEND) 200 MG tablet Take 200 mg by mouth 2 (two) times daily.  . [DISCONTINUED] gabapentin (NEURONTIN) 100 MG capsule Take 100 mg by mouth 2 (two) times daily.  . [DISCONTINUED] predniSONE (DELTASONE) 20 MG tablet Take 1 tablet (20 mg total) by mouth daily.   No facility-administered encounter medications on file as of 05/08/2015.    Orders for this visit: No orders of the defined types were placed in this encounter.    Thank  you for the visitation and for allowing  [facility] to assist in the care of your patient. Our recommendations are noted above.  Please contact us if we can be of further service.  Stephanie Acre,  MD [facility] Office Number: [phone]

## 2015-05-08 NOTE — Patient Instructions (Addendum)
Follow up with Dr. Dema Severin in 6 weeks - Starting 05/09/15 prednisone  daily follow up, if your cough gets worst then call us back, we may need to increase your prednisone back to the last effective dose (  daily) and wean back down slowly.  - cont with Infectious Disease Dr. Sampson Goon follow up  - cont with diet and exercise.  - omeprazole 40 mg - 1 tab daily for 1 month - continue with daily incentive spirometry Incentive Spirometer An incentive spirometer is a tool that can help keep your lungs clear and active. This tool measures how well you are filling your lungs with each breath. Taking long, deep breaths may help reverse or decrease the chance of developing breathing (pulmonary) problems (especially infection) following:  Surgery of the chest or abdomen.  Surgery if you have a history of smoking or a lung problem.  A long period of time when you are unable to move or be active. BEFORE THE PROCEDURE   If the spirometer includes an indicator to show your best effort, your nurse or respiratory therapist will set it to a desired goal.  If possible, sit up straight or lean slightly forward. Try not to slouch.  Hold the incentive spirometer in an upright position. INSTRUCTIONS FOR USE  1. Sit on the edge of your bed if possible, or sit up as far as you can in bed or on a chair. 2. Hold the incentive spirometer in an upright position. 3. Breathe out normally. 4. Place the mouthpiece in your mouth and seal your lips tightly around it. 5. Breathe in slowly and as deeply as possible, raising the piston or the ball toward the top of the column. 6. Hold your breath for 3-5 seconds or for as long as possible. Allow the piston or ball to fall to the bottom of the column. 7. Remove the mouthpiece from your mouth and breathe out normally. 8. Rest for a few seconds and repeat Steps 1 through 7 at least 10 times every 1-2 hours when you are awake. Take your time and take a few normal breaths  between deep breaths. 9. The spirometer may include an indicator to show your best effort. Use the indicator as a goal to work toward during each repetition. 10. After each set of 10 deep breaths, practice coughing to be sure your lungs are clear. If you have an incision (the cut made at the time of surgery), support your incision when coughing by placing a pillow or rolled-up towels firmly against it. Once you are able to get out of bed, walk around indoors and cough well. You may stop using the incentive spirometer when instructed by your caregiver.  RISKS AND COMPLICATIONS  Breathing too quickly may cause dizziness. At an extreme, this could cause you to pass out. Take your time so you do not get dizzy or light-headed.  If you are in pain, you may need to take or ask for pain medication before doing incentive spirometry. It is harder to take a deep breath if you are having pain. AFTER USE  Rest and breathe slowly and easily.  It can be helpful to keep a log of your progress. Your caregiver can provide you with a simple table to help with this. If you are using the spirometer at home, follow these instructions: SEEK MEDICAL CARE IF:   You are having difficultly using the spirometer.  You have trouble using the spirometer as often as instructed.  Your pain medication is not  giving enough relief while using the spirometer.  You develop fever of 100.9F (38.1C) or higher. SEEK IMMEDIATE MEDICAL CARE IF:   You cough up bloody sputum that had not been present before.  You develop fever of 102F (38.9C) or greater.  You develop worsening pain at or near the incision site. MAKE SURE YOU:   Understand these instructions.  Will watch your condition.  Will get help right away if you are not doing well or get worse. Document Released: 01/04/2007 Document Revised: 01/08/2014 Document Reviewed: 03/07/2007 [facility] Patient Information 2015 [place], [place]. This information is not  intended to replace advice given to you by your health care provider. Make sure you discuss any questions you have with your health care provider.

## 2015-05-22 ENCOUNTER — Emergency Department: Payer: Medicaid Other

## 2015-05-22 ENCOUNTER — Emergency Department
Admission: EM | Admit: 2015-05-22 | Discharge: 2015-05-22 | Disposition: A | Discharge status: Home / Self Care | Payer: Medicaid Other | Attending: Emergency Medicine | Admitting: Emergency Medicine

## 2015-05-22 DIAGNOSIS — Z7951 Long term (current) use of inhaled steroids: Secondary | ICD-10-CM | POA: Insufficient documentation

## 2015-05-22 DIAGNOSIS — Z880 Allergy status to penicillin: Secondary | ICD-10-CM | POA: Diagnosis not present

## 2015-05-22 DIAGNOSIS — F419 Anxiety disorder, unspecified: Secondary | ICD-10-CM | POA: Diagnosis not present

## 2015-05-22 DIAGNOSIS — Z79899 Other long term (current) drug therapy: Secondary | ICD-10-CM | POA: Insufficient documentation

## 2015-05-22 DIAGNOSIS — R05 Cough: Secondary | ICD-10-CM | POA: Diagnosis present

## 2015-05-22 DIAGNOSIS — J209 Acute bronchitis, unspecified: Secondary | ICD-10-CM | POA: Diagnosis not present

## 2015-05-22 DIAGNOSIS — J40 Bronchitis, not specified as acute or chronic: Secondary | ICD-10-CM

## 2015-05-22 LAB — CBC
HCT: 45.2 % (ref 35.0–47.0)
Hemoglobin: 14.5 g/dL (ref 12.0–16.0)
MCH: 26.3 pg (ref 26.0–34.0)
MCHC: 32.1 g/dL (ref 32.0–36.0)
MCV: 82.2 fL (ref 80.0–100.0)
PLATELETS: 270 10*3/uL (ref 150–440)
RBC: 5.5 MIL/uL — ABNORMAL HIGH (ref 3.80–5.20)
RDW: 13.5 % (ref 11.5–14.5)
WBC: 5.2 10*3/uL (ref 3.6–11.0)

## 2015-05-22 MED ORDER — LEVOFLOXACIN 500 MG PO TABS
500.0000 mg | ORAL_TABLET | Freq: Once | ORAL | Status: AC
  Administered 2015-05-22: 500 mg via ORAL

## 2015-05-22 MED ORDER — ONDANSETRON 4 MG PO TBDP
ORAL_TABLET | ORAL | Status: AC
  Administered 2015-05-22: 4 mg via ORAL
  Filled 2015-05-22: qty 1

## 2015-05-22 MED ORDER — ONDANSETRON 4 MG PO TBDP
4.0000 mg | ORAL_TABLET | Freq: Once | ORAL | Status: AC
  Administered 2015-05-22: 4 mg via ORAL

## 2015-05-22 MED ORDER — IPRATROPIUM-ALBUTEROL 0.5-2.5 (3) MG/3ML IN SOLN
RESPIRATORY_TRACT | Status: AC
  Administered 2015-05-22: 3 mL via RESPIRATORY_TRACT
  Filled 2015-05-22: qty 3

## 2015-05-22 MED ORDER — LEVOFLOXACIN 500 MG PO TABS: 500.0000 mg | ORAL_TABLET | Freq: Every day | ORAL | Status: AC

## 2015-05-22 MED ORDER — LEVOFLOXACIN 500 MG PO TABS
ORAL_TABLET | ORAL | Status: AC
  Administered 2015-05-22: 500 mg via ORAL
  Filled 2015-05-22: qty 1

## 2015-05-22 MED ORDER — IPRATROPIUM-ALBUTEROL 0.5-2.5 (3) MG/3ML IN SOLN
3.0000 mL | Freq: Once | RESPIRATORY_TRACT | Status: AC
  Administered 2015-05-22: 3 mL via RESPIRATORY_TRACT

## 2015-05-22 NOTE — Discharge Instructions (Signed)
Upper Respiratory Infection, Adult An upper respiratory infection (URI) is also known as the common cold. It is often caused by a type of germ (virus). Colds are easily spread (contagious). You can pass it to others by kissing, coughing, sneezing, or drinking out of the same glass. Usually, you get better in 1 or 2 weeks.  HOME CARE   Only take medicine as told by your doctor.  Use a warm mist humidifier or breathe in steam from a hot shower.  Drink enough water and fluids to keep your pee (urine) clear or pale yellow.  Get plenty of rest.  Return to work when your temperature is back to normal or as told by your doctor. You may use a face mask and wash your hands to stop your cold from spreading. GET HELP RIGHT AWAY IF:   After the first few days, you feel you are getting worse.  You have questions about your medicine.  You have chills, shortness of breath, or brown or red spit (mucus).  You have yellow or brown snot (nasal discharge) or pain in the face, especially when you bend forward.  You have a fever, puffy (swollen) neck, pain when you swallow, or white spots in the back of your throat.  You have a bad headache, ear pain, sinus pain, or chest pain.  You have a high-pitched whistling sound when you breathe in and out (wheezing).  You have a lasting cough or cough up blood.  You have sore muscles or a stiff neck. MAKE SURE YOU:   Understand these instructions.  Will watch your condition.  Will get help right away if you are not doing well or get worse. Document Released: 02/10/2008 Document Revised: 11/16/2011 Document Reviewed: 11/29/2013 [employer] Patient Information 2015 [employer]. This information is not intended to replace advice given to you by your health care provider. Make sure you discuss any questions you have with your health care provider.  

## 2015-05-22 NOTE — ED Notes (Signed)
Patient reports cough and congestion for 2 weeks, reports seeing her PMD and being put on Tessalon but that it is not helping. Patient reports sputum is clear. Reports symptoms are not getting any better.

## 2015-05-22 NOTE — ED Provider Notes (Signed)
[facility] Emergency Department Provider Note  ____________________________________________  Time seen: 2:30 PM  I have reviewed the triage vital signs and the nursing notes.   HISTORY  Chief Complaint Cough and Nasal Congestion    HPI Jennifer [name] is a 44 y.o. female who presents with complaints of cough for 2 weeks. She notes she saw her primary physician who put her on Tessalon but that is not helping. She notes her cough has worsened over the last 3-4 days. She denies fevers chills. She got short of breath. No recent travel. No leg swelling or pain. No chest pain. Cough is productive of thin sputum. Patient with history of sarcoidosis. She is on prednisone     Past Medical History  Diagnosis Date  . Tachycardia   . Sepsis   . Pneumonia     bilateral community acquired   . Lymphadenopathy   . Sarcoidosis     Patient Active Problem List   Diagnosis Date Noted  . Long term current use of systemic steroids 01/03/2015  . Sinus tachycardia 01/03/2015  . Cough 11/22/2014  . Dyspnea and respiratory abnormality 11/22/2014  . Cavitary lesion of lung 11/22/2014    Past Surgical History  Procedure Laterality Date  . Cesarean section      1992/1994  . Lymph node biopsy      Current Outpatient Rx  Name  Route  Sig  Dispense  Refill  . acetaminophen (TYLENOL) 500 MG tablet   Oral   Take 500 mg by mouth every 6 (six) hours as needed.         Marland Kitchen ADVAIR DISKUS 250-50 MCG/DOSE AEPB   Inhalation   Inhale 1 puff into the lungs as directed.   60 each   2     Dispense as written.   . benzonatate (TESSALON) 100 MG capsule   Oral   Take 100 mg by mouth every 6 (six) hours as needed.      0   . citalopram (CELEXA) 20 MG tablet   Oral   Take 20 mg by mouth at bedtime.      0   . fluticasone (FLONASE) 50 MCG/ACT nasal spray   Each Nare   Place 2 sprays into both nostrils daily.      0   . guaiFENesin-codeine 100-10 MG/5ML syrup  Oral   Take 5 mLs by mouth every 4 (four) hours as needed for cough.   120 mL   1   . metoprolol tartrate (LOPRESSOR) 50 MG tablet   Oral   Take 1 tablet (50 mg total) by mouth 2 (two) times daily.   30 tablet   6   . ondansetron (ZOFRAN) 4 MG tablet   Oral   Take 4 mg by mouth every 6 (six) hours as needed.      0   . polyethylene glycol powder (GLYCOLAX/MIRALAX) powder   Oral   Take 17 g by mouth as needed.      0   . predniSONE (DELTASONE) 5 MG tablet   Oral   Take 3 tablets (15 mg total) by mouth daily with breakfast.   93 tablet   0   . PROAIR HFA 108 (90 BASE) MCG/ACT inhaler   Inhalation   Inhale 108 mcg into the lungs every 6 (six) hours as needed.           Dispense as written.   . voriconazole (VFEND) 200 MG tablet   Oral   Take 200  mg by mouth 2 (two) times daily.           Allergies Penicillin g  Family History  Problem Relation Age of Onset  . Lung cancer Mother     Social History Social History  Substance Use Topics  . Smoking status: Never Smoker   . Smokeless tobacco: Never Used  . Alcohol Use: No    Review of Systems  Constitutional: Negative for fever. Eyes: Negative for visual changes. ENT: Negative for sore throat Cardiovascular: Negative for chest pain. Respiratory: Negative for shortness of breath. Positive cough Gastrointestinal: Negative for abdominal pain, vomiting and diarrhea. Genitourinary: Negative for dysuria. Musculoskeletal: Negative for back pain. Skin: Negative for rash. Neurological: Negative for headaches or focal weakness Psychiatric: Anxiety    ____________________________________________   PHYSICAL EXAM:  VITAL SIGNS: ED Triage Vitals  Enc Vitals Group     BP 05/22/15 1317 118/69 mmHg     Pulse Rate 05/22/15 1317 65     Resp 05/22/15 1317 22     Temp 05/22/15 1317 98.6 F (37 C)     Temp Source 05/22/15 1317 Oral     SpO2 05/22/15 1317 100 %     Weight 05/22/15 1317 112 lb (50.803 kg)      Height 05/22/15 1317 4\' 9"  (1.448 m)     Head Cir --      Peak Flow --      Pain Score 05/22/15 1319 0     Pain Loc --      Pain Edu? --      Excl. in GC? --      Constitutional: Alert and oriented. Nontoxic Eyes: Conjunctivae are normal.  ENT   Head: Normocephalic and atraumatic.   Mouth/Throat: Mucous membranes are moist. Cardiovascular: Normal rate, regular rhythm. Normal and symmetric distal pulses are present in all extremities. No murmurs, rubs, or gallops. Respiratory: Normal respiratory effort without tachypnea nor retractions. Scattered wheezes. Frequent cough Gastrointestinal: Soft and non-tender in all quadrants. No distention. There is no CVA tenderness. Genitourinary: deferred Musculoskeletal: Nontender with normal range of motion in all extremities. No lower extremity tenderness nor edema. Neurologic:  Normal speech and language. No gross focal neurologic deficits are appreciated. Skin:  Skin is warm, dry and intact. No rash noted. Psychiatric: Mood and affect are normal. Patient exhibits appropriate insight and judgment.  ____________________________________________    LABS (pertinent positives/negatives)  Labs Reviewed  CBC - Abnormal; Notable for the following:    RBC 5.50 (*)    All other components within normal limits    ____________________________________________   EKG  None  ____________________________________________    RADIOLOGY I have personally reviewed any xrays that were ordered on this patient: Chest x-ray with scarring and cavitary lesion that is chronic  ____________________________________________   PROCEDURES  Procedure(s) performed: none  Critical Care performed: none  ____________________________________________   INITIAL IMPRESSION / ASSESSMENT AND PLAN / ED COURSE  Pertinent labs & imaging results that were available during my care of the patient were reviewed by me and considered in my medical decision  making (see chart for details).  Patient with relatively severe cough history of sarcoidosis on prednisone and on anti-fungal agent. No evidence of pneumonia on chest x-ray but hindered by scarring. We will give her a duoneb and levaquin here. Her white blood cell count is reassuring and she is afebrile. Her heart rate is normal as well and she overall feels well. I will place her on Levaquin given worsening cough, and hx  of sarcoidosis. I have asked her to take her proair q4hr. She will follow up with Dr. Dema Severin this week. She knows to return to the ED if any sob, fever or hemoptysis   ____________________________________________   FINAL CLINICAL IMPRESSION(S) / ED DIAGNOSES  Final diagnoses:  Bronchitis     Jene Every, MD 05/22/15 1454

## 2015-05-23 ENCOUNTER — Telehealth: Payer: Self-pay | Admitting: Internal Medicine

## 2015-05-23 NOTE — Telephone Encounter (Signed)
Spoke with pt, states she needs to be scheduled for a HFU next available with VM in BT office.  Pt scheduled for next available.  Nothing further needed.

## 2015-05-27 ENCOUNTER — Encounter: Payer: Self-pay | Admitting: Internal Medicine

## 2015-05-27 ENCOUNTER — Ambulatory Visit (INDEPENDENT_AMBULATORY_CARE_PROVIDER_SITE_OTHER): Payer: Medicaid Other | Admitting: Internal Medicine

## 2015-05-27 VITALS — BP 124/66 | HR 103 | Ht <= 58 in | Wt 122.0 lb

## 2015-05-27 DIAGNOSIS — R059 Cough, unspecified: Secondary | ICD-10-CM

## 2015-05-27 DIAGNOSIS — J984 Other disorders of lung: Secondary | ICD-10-CM

## 2015-05-27 DIAGNOSIS — R05 Cough: Secondary | ICD-10-CM

## 2015-05-27 MED ORDER — OMEPRAZOLE 40 MG PO CPDR: 40.0000 mg | DELAYED_RELEASE_CAPSULE | Freq: Every day | ORAL | Status: DC

## 2015-05-27 NOTE — Progress Notes (Signed)
[facility] Pulmonary Medicine Consultation      MRN# [id number] Jennifer [name] 01-02-1971   CC: Chief Complaint  Patient presents with  . Hospitalization Follow-up    breathing better since ED visit; dry cough; 2 days left of abx      Brief History: Synopsis: 44 year old female with left upper lobe cavitary lesion, history of bilateral pneumonia, currently being followed by pulmonary and infectious disease. Cavitary lesion differential at this time includes fungal infection versus sarcoidosis. 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   ROV 12/20/14: She presents today for a followup visit of a left upper lobe cavitary lesion. Since her last visit she has followed with infectious disease is currently treating her with voriconazole. Current Aspergillus cultures are still pending from the state lab. Patient states that she's also had a recent admission to [facility] on March 22 2 11/28/2014. He presented to the hospital with nausea and vomiting from excessive coughing and was treated with Zofran, and supportive care. Today patient states she still has some coughing, but it is actually improving. She still endorses cough with shortness of breath and productive sputum (thick white at times).  Today she tried a full pulmonary function testing, was able to follow directions accurately, this was converted to a spirometry. She also attempted a 6 minute walk test, but noted to have moderate dizziness and leg weakness and during the 6 minutes only walk about 66 m/216 feet, she was also noted to be walking very slowly. 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 Since her last visit her cough has improved, but is still prevalent, her shortness of breath has improved but is still  impacting daily activities. Today she is accompanied by her sister. I have discussed this case with Dr. 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 This was explained to the patient, we will start steroid therapy today. Plan: Prednisone  daily.   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 Since her last visit her cough has improved, she has also been started on steroids, with great improvement in her respiratory status.  Today she states that she is able to walk further without significant sob. Still has a mild productive cough, mainly with intense exertion and rapid weather changes. Patient states that she has not been using her Advair regularly, due to forgetting at times.  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 Since her last visit, she was having some right leg pain, went to the ED, got RLE u\s which was negative for DVT.  She has been off Voriconazole for the past 2 week, due to running out of medications and her insurance requiring a prior authorization to continue it.  Patient tells a that since being off voriconazole she's had a mild cough that is productive with clear sputum. She is also still having some minor right-sided leg pain for which she is using a cane to walk with. Otherwise, patient has good clinical improvement from a respiratory standpoint except for this mild cough that has been restarted since being off of voriconazole. Today  patient does have a cane for right leg discomfort with prolonged use.  Plan: - Prednisone 20 mg daily until 04/07/2015, start prednisone 15 mg August 1, 1 tab daily until follow-up - Continue with Antifungal  treatment by Infectious Disease and followup with Dr. Sampson Goon (infectious disease), discussed with Dr. 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 She tells me today that she cannot lay flat, due to having sob, and choking sensation. She is currently following with Dr. Sampson Goon, who has continued her voriconazole.  Today she is accompanied by her Social Haematologist Drenda Freeze).  Plan - antifungals, prednisone  daily  Events since last clinic visit: Patient presents today for a follow up ER visit for recent diagnosis of acute bronchitis. Patient had 3 days of cough, subjective fever, congestion and productive sputum (thick white), went to the ED on 05/22/15, diagnosed with acute bronchitis, and started on levaquin  For 7 days. Today still has mild cough, but much improved, back to baseline cough. Accompanied by Child psychotherapist. Overall with clinical improvement, has 1 more day left of Levaquin.   Medication:   Current Outpatient Rx  Name  Route  Sig  Dispense  Refill  . acetaminophen (TYLENOL) 500 MG tablet   Oral   Take 500 mg by mouth every 6 (six) hours as needed.         Marland Kitchen ADVAIR DISKUS 250-50 MCG/DOSE AEPB   Inhalation   Inhale 1 puff into the lungs as directed.   60 each   2     Dispense as written.   . benzonatate (TESSALON) 100 MG capsule   Oral   Take 100 mg by mouth every 6 (six) hours as needed.      0   . citalopram (CELEXA) 20 MG tablet   Oral   Take 20 mg by mouth at bedtime.      0   . fluticasone (FLONASE) 50 MCG/ACT nasal spray   Each Nare   Place 2 sprays into both nostrils daily.      0   . guaiFENesin-codeine 100-10 MG/5ML syrup   Oral   Take 5 mLs by mouth every 4 (four) hours as needed for cough.   120 mL   1   . levofloxacin (LEVAQUIN)  500 MG tablet   Oral   Take 1 tablet (500 mg total) by mouth daily.   7 tablet   0   . metoprolol tartrate (LOPRESSOR) 50 MG tablet   Oral   Take 1 tablet (50 mg total) by mouth 2 (two) times daily.   30 tablet   6   . ondansetron (ZOFRAN) 4 MG tablet   Oral   Take 4 mg by mouth every 6 (six) hours as needed.      0   . polyethylene glycol powder (GLYCOLAX/MIRALAX) powder   Oral   Take 17 g by mouth as needed.      0   . predniSONE (DELTASONE) 5 MG tablet   Oral   Take 3 tablets (15 mg total) by mouth daily with breakfast.   93 tablet   0   . PROAIR HFA 108 (90 BASE) MCG/ACT inhaler   Inhalation   Inhale 108 mcg into the lungs every 6 (six) hours as needed.           Dispense as written.   Marland Kitchen  voriconazole (VFEND) 200 MG tablet   Oral   Take 200 mg by mouth 2 (two) times daily.         Marland Kitchen omeprazole (PRILOSEC) 40 MG capsule   Oral   Take 1 capsule (40 mg total) by mouth daily.   30 capsule   3      Review of Systems  Constitutional: Negative for fever, chills and weight loss.  HENT: Positive for congestion. Negative for ear discharge, ear pain, hearing loss, nosebleeds and tinnitus.   Eyes: Negative for blurred vision and pain.  Respiratory: Positive for cough, shortness of breath and wheezing. Negative for sputum production.   Cardiovascular: Negative for chest pain and palpitations.  Gastrointestinal: Negative for heartburn, nausea and vomiting.  Skin: Negative for rash.  Neurological: Negative for dizziness and headaches.  Endo/Heme/Allergies: Negative for environmental allergies. Does not bruise/bleed easily.      Allergies:  Penicillin g  Physical Examination:  VS: BP 124/66 mmHg  Pulse 103  Ht 4\' 9"  (1.448 m)  Wt 122 lb (55.339 kg)  BMI 26.39 kg/m2  SpO2 98%  LMP 05/19/2015  General Appearance: No distress  HEENT: PERRLA, no ptosis, no other lesions noticed Pulmonary:good respiratory effort, no wheezes, no crackles.  Cardiovascular:   Normal S1,S2.  No m/r/g.     Abdomen:Exam: Benign, Soft, non-tender, No masses  Skin:   warm, no rashes, no ecchymosis  Extremities: normal, no cyanosis, clubbing, warm with normal capillary refill.      Rad results: (The following images and results were reviewed by Dr. Dema Severin). CXR 05/22/15 FINDINGS: There is extensive scarring in both upper lobes with chronic hilar adenopathy and chronic cavitary lesion left upper lobe. The lower lobes are clear. No acute infiltrates or effusions. Heart size and vascularity are normal. No osseous abnormality.  IMPRESSION: No acute abnormalities. Extensive chronic changes of sarcoidosis including a cavitary lesion in the left upper lobe. The appearance is stable.    Assessment and Plan: Cavitary lesion of lung Left upper lobe cavitary lung lesion with significant hilar (bilateral) and maxillary lymphadenopathy. 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 Patient is currently being treated with voriconazole and will be following with infectious disease. I still  have a high suspicion for sarcoid disease over fungal infection, I believe fungal infection is more opportunistic than the primary cause of her cavitary lung lesion.  Patient does have a normal ACE level, however specificity of using ACE level for diagnosis of sarcoid is very low, and a low ACE level can be seen in sarcoid patients; ACE level is used as more of a monitoring modality for treatment response with respect to sarcoid patients.  Her fungal cultures from the BAL washings are currently positive for Aspergillus but speciation is still pending.  I  have discussed the case with Dr. 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 Recent echo with a normal EF.  The above was discussed in detail with the patient, who is in agreement.  Patient has had her optometry exam and is currently wearing new prescription glasses.  Patient with good improvement in her respiratory status, however now with persistent cough. Her voriconazole was restarted in  late July.   Plan: - Continue with prednisone  starting, if cough starts to get worst may need to go back to last effective dose (15-20mg ) and wean slowly down.  - Continue with Antifungal treatment by Infectious Disease and followup with Dr. Sampson Goon (infectious disease). - CT chest with contrast prior to follow up visit.           Cough This episode secondary to acute bronchitis  Plan: - cont with current antibiotics (levaquin x 7 days total). - cont with current prednisone dose (  daily). If cough starts to get worst, will increase Prednisone back to  daily.  -Supportive care and management as outlined for cavitary lung lesion - cont with omeprazole.           Updated Medication List Outpatient Encounter Prescriptions as of 05/27/2015  Medication Sig  . acetaminophen (TYLENOL) 500 MG tablet Take 500 mg by mouth every 6 (six) hours as needed.  Marland Kitchen ADVAIR DISKUS 250-50 MCG/DOSE AEPB Inhale 1 puff into the lungs as directed.  . benzonatate (TESSALON) 100 MG capsule Take 100 mg by mouth every 6 (six) hours as needed.  . citalopram (CELEXA) 20 MG tablet Take 20 mg by mouth at bedtime.  . fluticasone (FLONASE) 50 MCG/ACT nasal spray Place 2 sprays into both nostrils daily.  Marland Kitchen guaiFENesin-codeine 100-10 MG/5ML syrup Take 5 mLs by mouth every 4 (four) hours as needed for cough.  Marland Kitchen levofloxacin (LEVAQUIN) 500 MG tablet Take 1 tablet (500 mg total) by mouth daily.  .  metoprolol tartrate (LOPRESSOR) 50 MG tablet Take 1 tablet (50 mg total) by mouth 2 (two) times daily.  . ondansetron (ZOFRAN) 4 MG tablet Take 4 mg by mouth every 6 (six) hours as needed.  . polyethylene glycol powder (GLYCOLAX/MIRALAX) powder Take 17 g by mouth as needed.  . predniSONE (DELTASONE) 5 MG tablet Take 3 tablets (15 mg total) by mouth daily with breakfast.  . PROAIR HFA 108 (90 BASE) MCG/ACT inhaler Inhale 108 mcg into the lungs every 6 (six) hours as needed.  . voriconazole (VFEND) 200 MG tablet Take 200 mg by mouth 2 (two) times daily.  Marland Kitchen omeprazole (PRILOSEC) 40 MG capsule Take 1 capsule (40 mg total) by mouth daily.   No facility-administered encounter medications on file as of 05/27/2015.    Orders for this visit: Orders Placed This Encounter  Procedures  . CT Chest W Contrast    Standing Status: Future     Number of Occurrences:      Standing Expiration Date: 07/26/2016    Order Specific Question:  Reason for Exam (SYMPTOM  OR DIAGNOSIS REQUIRED)    Answer:  Cavitary Lung Lesion    Order Specific Question:  Is the patient pregnant?    Answer:  No    Order Specific Question:  Preferred imaging location?    Answer:  [facility]    Thank  you for the visitation and for allowing  [facility] to assist in the care of your patient. Our recommendations are noted above.  Please contact us if we can be of further service.  Stephanie Acre, MD [facility] Office Number: [phone]

## 2015-05-27 NOTE — Assessment & Plan Note (Signed)
Left upper lobe cavitary lung lesion with significant hilar (bilateral) and maxillary lymphadenopathy. 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 Patient is currently being treated with voriconazole and will be following with infectious disease. I still  have a high suspicion for sarcoid disease over fungal infection, I believe fungal infection is more opportunistic than the primary cause of her cavitary lung lesion.  Patient does have a normal ACE level, however specificity of using ACE level for diagnosis of sarcoid is very low, and a low ACE level can be seen in sarcoid patients; ACE level is used as more of a monitoring modality for treatment response with respect to sarcoid patients.  Her fungal cultures from the BAL washings are currently positive for Aspergillus but speciation is still pending.  I have discussed the case with Dr. 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 Recent echo with a normal EF.  The above was discussed in detail with the patient, who is in agreement.  Patient has had her optometry exam and is currently wearing new prescription glasses.  Patient with good improvement in her respiratory status, however now with persistent cough. Her voriconazole was restarted in late July.   Plan: - Continue with prednisone  starting, if cough starts to get worst may need to go back to last effective dose (15-20mg ) and wean slowly down.  - Continue with Antifungal treatment by Infectious Disease and followup with Dr. Sampson Goon  (infectious disease). - CT chest with contrast prior to follow up visit.

## 2015-05-27 NOTE — Patient Instructions (Addendum)
Follow up with Dr. Dema Severin in 4-6 weeks - cont with prednisone  daily. - if you cough gets worst, please increase to 3 prednisone pills daily ( ) until follow up with Dr. Dema Severin  - cont with your current dose of levaquin - please make a follow up with Infectious Disease in 1 month - please make an appointment to see an eye doctor soon, for clinical suspicion of Sarcoid - cont with antifungals as directed by Dr. Sampson Goon (ID).  - cont with your acid reflux pills (omeprazole) - we will refill  - CT Chest with contrast for Cavitary lung lesion prior to follow up visit.   Physician involve in your Care Dr. Dema Severin - [facility] - for Sarcoid and Cavitary Lung lesion Dr. Sampson Goon - [facility] - For fungal Cavitary Lung lesion  Follow up appointments Eye Doctor - initial consult for annual eye exam, and pulmonary sarcoid.  Infectious Disease Doctor - follow up

## 2015-05-27 NOTE — Assessment & Plan Note (Signed)
This episode secondary to acute bronchitis  Plan: - cont with current antibiotics (levaquin x 7 days total). - cont with current prednisone dose (  daily). If cough starts to get worst, will increase Prednisone back to  daily.  -Supportive care and management as outlined for cavitary lung lesion - cont with omeprazole.

## 2015-06-17 DIAGNOSIS — Z7952 Long term (current) use of systemic steroids: Secondary | ICD-10-CM | POA: Insufficient documentation

## 2015-07-01 ENCOUNTER — Other Ambulatory Visit: Payer: Self-pay

## 2015-07-01 DIAGNOSIS — D869 Sarcoidosis, unspecified: Secondary | ICD-10-CM

## 2015-07-04 ENCOUNTER — Ambulatory Visit
Admission: RE | Admit: 2015-07-04 | Discharge: 2015-07-04 | Disposition: A | Discharge status: Home / Self Care | Payer: Medicaid Other | Source: Ambulatory Visit | Attending: Internal Medicine | Admitting: Internal Medicine

## 2015-07-04 DIAGNOSIS — J984 Other disorders of lung: Secondary | ICD-10-CM

## 2015-07-04 DIAGNOSIS — R590 Localized enlarged lymph nodes: Secondary | ICD-10-CM | POA: Insufficient documentation

## 2015-07-04 DIAGNOSIS — D860 Sarcoidosis of lung: Secondary | ICD-10-CM | POA: Diagnosis not present

## 2015-07-04 DIAGNOSIS — R188 Other ascites: Secondary | ICD-10-CM | POA: Diagnosis not present

## 2015-07-04 DIAGNOSIS — R918 Other nonspecific abnormal finding of lung field: Secondary | ICD-10-CM | POA: Insufficient documentation

## 2015-07-04 MED ORDER — IOHEXOL 300 MG/ML  SOLN
75.0000 mL | Freq: Once | INTRAMUSCULAR | Status: AC | PRN
  Administered 2015-07-04: 75 mL via INTRAVENOUS

## 2015-07-10 ENCOUNTER — Ambulatory Visit: Payer: Medicaid Other | Admitting: Internal Medicine

## 2015-07-16 ENCOUNTER — Ambulatory Visit: Payer: Disability Insurance

## 2015-07-16 DIAGNOSIS — D869 Sarcoidosis, unspecified: Secondary | ICD-10-CM | POA: Diagnosis present

## 2015-08-14 ENCOUNTER — Telehealth: Payer: Self-pay | Admitting: Internal Medicine

## 2015-08-14 MED ORDER — PREDNISONE 5 MG PO TABS: 10.0000 mg | ORAL_TABLET | Freq: Every day | ORAL | Status: DC

## 2015-08-14 NOTE — Telephone Encounter (Signed)
Spoke with pt and is aware RX sent in. She takes pred 10 mg daily. Nothing further needed

## 2015-09-19 ENCOUNTER — Telehealth: Payer: Self-pay | Admitting: *Deleted

## 2015-09-19 MED ORDER — PREDNISONE 5 MG PO TABS: 10.0000 mg | ORAL_TABLET | Freq: Every day | ORAL | Status: DC

## 2015-09-19 NOTE — Telephone Encounter (Signed)
Spoke with pt. It is Prednisone she needs filled and not BP meds. Prednisone filled x 1 month. Nothing further needed.

## 2015-09-19 NOTE — Telephone Encounter (Signed)
°*  STAT* If patient is at the pharmacy, call can be transferred to refill team.   1. Which medications need to be refilled? (please list name of each medication and dose if known) BP medication   2. Which pharmacy/location (including street and city if local pharmacy) is medication to be sent [facility] [address]   3. Do they need a 30 day or 90 day supply? 30 day

## 2015-09-24 ENCOUNTER — Ambulatory Visit (INDEPENDENT_AMBULATORY_CARE_PROVIDER_SITE_OTHER): Payer: Medicaid Other | Admitting: Internal Medicine

## 2015-09-24 ENCOUNTER — Encounter: Payer: Self-pay | Admitting: Internal Medicine

## 2015-09-24 VITALS — BP 124/70 | HR 107 | Ht <= 58 in | Wt 139.8 lb

## 2015-09-24 DIAGNOSIS — J984 Other disorders of lung: Secondary | ICD-10-CM

## 2015-09-24 DIAGNOSIS — R05 Cough: Secondary | ICD-10-CM | POA: Diagnosis not present

## 2015-09-24 DIAGNOSIS — R059 Cough, unspecified: Secondary | ICD-10-CM

## 2015-09-24 MED ORDER — PREDNISONE 5 MG PO TABS: 10.0000 mg | ORAL_TABLET | Freq: Every day | ORAL | Status: DC

## 2015-09-24 NOTE — Patient Instructions (Addendum)
Follow up with Dr. Dema Severin in: 3 months - CT Chest with contrast prior to next visit - cavitary lung lesion/sarcoid - cont with prednisone  daily - cont Antifungal treatment as directed by Dr. Sampson Goon from Infectious Disease - cont with Advair. -gargle and rinse after each use.

## 2015-09-24 NOTE — Progress Notes (Signed)
[facility] Pulmonary Medicine Consultation      MRN# [id number] Jennifer [name] 06-Apr-1971   CC: Chief Complaint  Patient presents with  . Follow-up    pt. states she still has dry cough mostly @ night. occ. SOB. denies wheezing or chest pain/tightness.      Brief History: Synopsis: 45 year old female with left upper lobe cavitary lesion, history of bilateral pneumonia, currently being followed by pulmonary and infectious disease. Cavitary lesion differential at this time includes fungal infection versus sarcoidosis. 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   ROV 12/20/14: She presents today for a followup visit of a left upper lobe cavitary lesion. Since her last visit she has followed with infectious disease is currently treating her with voriconazole. Current Aspergillus cultures are still pending from the state lab. Patient states that she's also had a recent admission to [facility] on March 22 2 11/28/2014. He presented to the hospital with nausea and vomiting from excessive coughing and was treated with Zofran, and supportive care. Today patient states she still has some coughing, but it is actually improving. She still endorses cough with shortness of breath and productive sputum (thick white at times).  Today she tried a full pulmonary function testing, was able to follow directions accurately, this was converted to a spirometry. She also attempted a 6 minute walk test, but noted to have moderate dizziness and leg weakness and during the 6 minutes only walk about 66 m/216 feet, she was also noted to be walking very slowly. 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 Since her last visit her cough has improved, but is still prevalent, her shortness of breath has  improved but is still impacting daily activities. Today she is accompanied by her sister. I have discussed this case with Dr. 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 This was explained to the patient, we will start steroid therapy today. Plan: Prednisone  daily.   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 Since her last visit her cough has improved, she has also been started on steroids, with great improvement in her respiratory status.  Today she states that she is able to walk further without significant sob. Still has a mild productive cough, mainly with intense exertion and rapid weather changes. Patient states that she has not been using her Advair regularly, due to forgetting at times.  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 Since her last visit, she was having some right leg pain, went to the ED, got RLE u\s which was negative for DVT.  She has been off Voriconazole for the past 2 week, due to running out of medications and her insurance requiring a prior authorization to continue it.  Patient tells a that since being off voriconazole she's had a mild cough that is productive with clear sputum. She is also still having some minor right-sided leg pain for which she is using a cane to walk with. Otherwise, patient has good clinical improvement from a respiratory standpoint except for this mild cough that has been restarted since being  off of voriconazole. Today patient does have a cane for right leg discomfort with prolonged use.  Plan: - Prednisone 20 mg daily until 04/07/2015, start prednisone 15 mg August 1, 1 tab daily until follow-up - Continue  with Antifungal treatment by Infectious Disease and followup with Dr. Sampson Goon (infectious disease), discussed with Dr. 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 She tells me today that she cannot lay flat, due to having sob, and choking sensation. She is currently following with Dr. Sampson Goon, who has continued her voriconazole.  Today she is accompanied by her Social Haematologist Drenda Freeze).  Plan - antifungals, prednisone 10mg  daily  ROV 05/27/2015: Patient presents today for a follow up ER visit for recent diagnosis of acute bronchitis. Patient had 3 days of cough, subjective fever, congestion and productive sputum (thick white), went to the ED on 05/22/15, diagnosed with acute bronchitis, and started on levaquin  For 7 days. Today still has mild cough, but much improved, back to baseline cough. Accompanied by Child psychotherapist. Overall with clinical improvement, has 1 more day left of Levaquin. Plan: - Continue with prednisone 10mg  starting, if cough starts to get worst may need to go back to last effective dose (15-20mg ) and wean slowly down.  - Continue with Antifungal treatment by Infectious Disease and followup with Dr. Sampson Goon (infectious disease). - CT chest with contrast prior to follow up visit.   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 Today she endorses shortness of breath along with chronic dry cough, again these are both chronic conditions. She has follow-up with infectious disease, who has stopped her voriconazole at this time. She is not accompanied by Child psychotherapist today Currently using prednisone 10 milligrams daily.Tussionex as needed for cough Medication:   Current Outpatient Rx    Name  Route  Sig  Dispense  Refill  . acetaminophen (TYLENOL) 500 MG tablet   Oral   Take 500 mg by mouth every 6 (six) hours as needed.         Marland Kitchen ADVAIR DISKUS 250-50 MCG/DOSE AEPB   Inhalation   Inhale 1 puff into the lungs as directed.   60 each   2     Dispense as written.   . benzonatate (TESSALON) 100 MG capsule   Oral   Take 100 mg by mouth every 6 (six) hours as needed.      0   . citalopram (CELEXA) 20 MG tablet   Oral   Take 20 mg by mouth at bedtime.      0   . fluticasone (FLONASE) 50 MCG/ACT nasal spray   Each Nare   Place 2 sprays into both nostrils daily.      0   . metoprolol tartrate (LOPRESSOR) 50 MG tablet   Oral   Take 1 tablet (50 mg total) by mouth 2 (two) times daily.   30 tablet   6   . omeprazole (PRILOSEC) 40 MG capsule   Oral   Take 1 capsule (40 mg total) by mouth daily.   30 capsule   3   . ondansetron (ZOFRAN) 4 MG tablet   Oral   Take 4 mg by mouth every 6 (six) hours as needed.      0   . polyethylene glycol powder (GLYCOLAX/MIRALAX) powder  Oral   Take 17 g by mouth as needed.      0   . predniSONE (DELTASONE) 5 MG tablet   Oral   Take 2 tablets (10 mg total) by mouth daily with breakfast.   60 tablet   3   . PROAIR HFA 108 (90 BASE) MCG/ACT inhaler   Inhalation   Inhale 108 mcg into the lungs every 6 (six) hours as needed.           Dispense as written.      Review of Systems  Constitutional: Negative for fever, chills and weight loss.  HENT: Negative for ear discharge, ear pain, hearing loss, nosebleeds and tinnitus.   Eyes: Negative for blurred vision and pain.  Respiratory: Positive for cough and shortness of breath. Negative for sputum production.   Cardiovascular: Negative for chest pain and palpitations.  Gastrointestinal: Negative for heartburn, nausea and vomiting.  Skin: Negative for rash.  Neurological: Negative for dizziness and headaches.  Endo/Heme/Allergies: Negative for environmental  allergies. Does not bruise/bleed easily.      Allergies:  Penicillin g  Physical Examination:  VS: BP 124/70 mmHg  Pulse 107  Ht 4\' 9"  (1.448 m)  Wt 139 lb 12.8 oz (63.413 kg)  BMI 30.24 kg/m2  SpO2 98%  General Appearance: No distress  HEENT: PERRLA, no ptosis, no other lesions noticed Pulmonary:good respiratory effort, no wheezes, no crackles.  Cardiovascular:  Normal S1,S2.  No m/r/g.     Abdomen:Exam: Benign, Soft, non-tender, No masses  Skin:   warm, no rashes, no ecchymosis  Extremities: normal, no cyanosis, clubbing, warm with normal capillary refill.      Rad results: (The following images and results were reviewed by Dr. Dema Severin on 09/24/2015). CT Chest 07/04/15 CLINICAL DATA: Cavitary lung lesion. Intermittent shortness of breath. 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  EXAM: CT CHEST WITH CONTRAST  TECHNIQUE: Multidetector CT imaging of the chest was performed during intravenous contrast administration.  CONTRAST: 75mL OMNIPAQUE IOHEXOL 300 MG/ML SOLN  COMPARISON: 11/05/2014 chest CT. 05/22/2015 chest radiograph.  FINDINGS: Mediastinum/Nodes: Normal heart size. No pericardial fluid/thickening. Great vessels are normal in course and caliber. No central pulmonary emboli. Normal visualized thyroid. Fluid level seen in the thoracic esophagus, indicating esophageal dysmotility and/ or gastroesophageal reflux. No appreciable esophageal wall thickening. Mildly enlarged 1.0 cm left axillary node (series 2/ image 14), decreased from 1.5 cm on 11/05/2014. No pathologically enlarged right axillary nodes. Mildly enlarged 1.1 cm high left mediastinal node (2/11), decreased from 1.4 cm. Mildly enlarged 1.0 cm subcarinal node (2/27), previously 1.5 cm using similar measurement technique, decreased. Mild bilateral hilar lymphadenopathy, decreased bilaterally, including a 1.0 cm right hilar node (2/24),  decreased from 1.2 cm and a 1.1 cm left suprahilar node (2/19), decreased from 1.3 cm.  Lungs/Pleura: No pneumothorax. No pleural effusion. Re- demonstrated is upper lung predominant relatively symmetric perilymphatic distribution nodularity in both lungs, predominantly peribronchovascular, with scattered subpleural nodularity, predominantly involving the superior segment lower lobes and bilateral upper lobes. These findings have moderately decreased in severity compared to the 11/05/2014 chest CT study. There is superimposed peribronchovascular reticulation, parenchymal banding, volume loss and architectural distortion, indicating superimposed fibrosis. There is focal cavitary change in the posterior left upper lobe with associated mild thickening of the cavity wall, with the largest cavity measuring 2.1 cm, which is significantly decreased from 3.5 cm on the 11/05/2014 CT, with significantly decreased cavity wall thickening. No nodule or fluid level is seen  within the residual cavity. No new lung cavities. No acute consolidative airspace disease or new lung mass.  Upper abdomen: Hypodense subcentimeter right liver lobe lesion, too small to characterize, stable since 11/05/2014. No new liver lesions.  Musculoskeletal: No aggressive appearing focal osseous lesions.  IMPRESSION: 1. Moderate interval improvement in the findings of pulmonary sarcoidosis compared to 11/05/14, with persistent relatively symmetric upper lung predominant perilymphatic distribution nodularity and superimposed moderate upper lobe fibrosis. 2. Significantly decreased size and decreased wall thickening of the left upper lobe cavity, with mild residual cavity wall thickening but no fluid levels or nodules to suggest mycetoma or acute infection. 3. Mild residual left axillary, mediastinal and bilateral hilar lymphadenopathy, all decreased. 4. Fluid level in the thoracic esophagus, suggesting  esophageal dysmotility and/or gastroesophageal reflux.     Assessment and Plan: Cough This episode is mostly due to her cavitary lung lesion, it is dry and more post infectious.  Plan: - cont with current prednisone dose (  daily). If cough starts to get worst, will increase Prednisone back to  daily.  - Supportive care and management as outlined for cavitary lung lesion - cont with omeprazole.  - cont with Advair          Cavitary lesion of lung Left upper lobe cavitary lung lesion with significant hilar (bilateral) and maxillary lymphadenopathy. 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 Patient is currently being treated with voriconazole and will be following with infectious disease. I still  have a high suspicion for sarcoid disease over fungal infection, I believe fungal infection is more opportunistic than the primary cause of her cavitary lung lesion.  Patient does have a normal ACE level, however specificity of using ACE level for diagnosis of sarcoid is very low, and a low ACE level can be seen in sarcoid patients; ACE level is used as more of a monitoring modality for treatment response with respect to sarcoid patients.  Her fungal cultures from the BAL washings are currently positive for Aspergillus but speciation is still pending.  I have discussed the case with Dr. 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 Recent echo with a normal EF.  The above was discussed in detail with the patient, who is in  agreement.  Patient has had her optometry exam and is currently wearing new prescription glasses.  Patient with good improvement in her respiratory status, however now with persistent cough. Her voriconazole was restarted in late July.   Plan: - Continue with prednisone , if cough starts to get worst may need to go back to last effective dose (15-20mg ) and wean slowly down.  - Continue with Antifungal treatment by Infectious Disease and followup with Dr. Sampson Goon (infectious disease). - CT chest with contrast prior to follow up visit.  - cont with Advair.              Updated Medication List Outpatient Encounter Prescriptions as of 09/24/2015  Medication Sig  . acetaminophen (TYLENOL) 500 MG tablet Take 500 mg by mouth every 6 (six) hours as needed.  Marland Kitchen ADVAIR DISKUS 250-50 MCG/DOSE AEPB Inhale 1 puff into the lungs as directed.  . benzonatate (TESSALON) 100 MG capsule  Take 100 mg by mouth every 6 (six) hours as needed.  . citalopram (CELEXA) 20 MG tablet Take 20 mg by mouth at bedtime.  . fluticasone (FLONASE) 50 MCG/ACT nasal spray Place 2 sprays into both nostrils daily.  . metoprolol tartrate (LOPRESSOR) 50 MG tablet Take 1 tablet (50 mg total) by mouth 2 (two) times daily.  Marland Kitchen omeprazole (PRILOSEC) 40 MG capsule Take 1 capsule (40 mg total) by mouth daily.  . ondansetron (ZOFRAN) 4 MG tablet Take 4 mg by mouth every 6 (six) hours as needed.  . polyethylene glycol powder (GLYCOLAX/MIRALAX) powder Take 17 g by mouth as needed.  . predniSONE (DELTASONE) 5 MG tablet Take 2 tablets (10 mg total) by mouth daily with breakfast.  . PROAIR HFA 108 (90 BASE) MCG/ACT inhaler Inhale 108 mcg into the lungs every 6 (six) hours as needed.  . [DISCONTINUED] predniSONE (DELTASONE) 5 MG tablet Take 2 tablets (10 mg total) by mouth daily with breakfast.  . [DISCONTINUED] guaiFENesin-codeine 100-10 MG/5ML syrup Take 5 mLs by mouth every 4 (four) hours as needed for cough. (Patient not  taking: Reported on 09/24/2015)  . [DISCONTINUED] voriconazole (VFEND) 200 MG tablet Take 200 mg by mouth 2 (two) times daily. Reported on 09/24/2015   No facility-administered encounter medications on file as of 09/24/2015.    Orders for this visit: Orders Placed This Encounter  Procedures  . CT Chest W Contrast    Standing Status: Future     Number of Occurrences:      Standing Expiration Date: 11/21/2016    Order Specific Question:  If indicated for the ordered procedure, I authorize the administration of contrast media per Radiology protocol    Answer:  Yes    Order Specific Question:  Reason for Exam (SYMPTOM  OR DIAGNOSIS REQUIRED)    Answer:  cavitary lung lesion/ sarcoid    Order Specific Question:  Is the patient pregnant?    Answer:  No    Order Specific Question:  Preferred imaging location?    Answer:  [facility]    Thank  you for the visitation and for allowing  [facility] to assist in the care of your patient. Our recommendations are noted above.  Please contact us if we can be of further service.  Stephanie Acre, MD [facility] Office Number: [phone]

## 2015-09-24 NOTE — Assessment & Plan Note (Signed)
Left upper lobe cavitary lung lesion with significant hilar (bilateral) and maxillary lymphadenopathy. 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 Patient is currently being treated with voriconazole and will be following with infectious disease. I still  have a high suspicion for sarcoid disease over fungal infection, I believe fungal infection is more opportunistic than the primary cause of her cavitary lung lesion.  Patient does have a normal ACE level, however specificity of using ACE level for diagnosis of sarcoid is very low, and a low ACE level can be seen in sarcoid patients; ACE level is used as more of a monitoring modality for treatment response with respect to sarcoid patients.  Her fungal cultures from the BAL washings are currently positive for Aspergillus but speciation is still pending.  I have discussed the case with Dr. 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 Recent echo with a normal EF.  The above was discussed in detail with the patient, who is in agreement.  Patient has had her optometry exam and is currently wearing new prescription glasses.  Patient with good improvement in her respiratory status, however now with persistent cough. Her voriconazole was restarted in late July.   Plan: - Continue with prednisone , if cough starts to get worst may need to go back to last effective dose (15-20mg ) and wean slowly down.  - Continue with Antifungal treatment by Infectious Disease and followup with Dr. Sampson Goon (infectious  disease). - CT chest with contrast prior to follow up visit.  - cont with Advair.

## 2015-09-24 NOTE — Assessment & Plan Note (Signed)
This episode is mostly due to her cavitary lung lesion, it is dry and more post infectious.  Plan: - cont with current prednisone dose (  daily). If cough starts to get worst, will increase Prednisone back to  daily.  - Supportive care and management as outlined for cavitary lung lesion - cont with omeprazole.  - cont with Advair

## 2015-09-25 ENCOUNTER — Telehealth: Payer: Self-pay | Admitting: *Deleted

## 2015-09-25 NOTE — Telephone Encounter (Signed)
Pt calling to verify appt.  Appt 09/26/2015 at 0930am, left message informing pt of the appt time and to arrive 15 minutes early to complete paperwork.

## 2015-09-26 ENCOUNTER — Ambulatory Visit (INDEPENDENT_AMBULATORY_CARE_PROVIDER_SITE_OTHER): Payer: Medicaid Other

## 2015-09-26 ENCOUNTER — Encounter: Payer: Self-pay | Admitting: Podiatry

## 2015-09-26 ENCOUNTER — Ambulatory Visit (INDEPENDENT_AMBULATORY_CARE_PROVIDER_SITE_OTHER): Payer: Medicaid Other | Admitting: Podiatry

## 2015-09-26 DIAGNOSIS — R52 Pain, unspecified: Secondary | ICD-10-CM

## 2015-09-26 DIAGNOSIS — M722 Plantar fascial fibromatosis: Secondary | ICD-10-CM | POA: Diagnosis not present

## 2015-09-26 DIAGNOSIS — M79673 Pain in unspecified foot: Secondary | ICD-10-CM

## 2015-09-26 MED ORDER — DICLOFENAC SODIUM 75 MG PO TBEC: 75.0000 mg | DELAYED_RELEASE_TABLET | Freq: Two times a day (BID) | ORAL | Status: DC

## 2015-09-26 NOTE — Progress Notes (Signed)
Subjective:     Patient ID: Jennifer [name], female   DOB: 1970-12-02, 45 y.o.   MRN: [id number]  HPI 45 year old female presents the office of concerns of bilateral foot pain to the bottom of her feet in the heel and the arch which has been ongoing for approximate one year with a right side worse than left. She states that she has pain in the morning and she first gets up or after periods of standing. She denies any numbness or tingling. No swelling or redness. No recent injury or trauma. The pain does not wake her at night. She has purchased over-the-counter arch supports or seems to helped and she does notice a difference that she wears a less supportive shoe. No other complaints at this time.  Review of Systems  All other systems reviewed and are negative.      Objective:   Physical Exam General: AAO x3, NAD  Dermatological: Skin is warm, dry and supple bilateral. Nails x 10 are well manicured; remaining integument appears unremarkable at this time. There are no open sores, no preulcerative lesions, no rash or signs of infection present.  Vascular: Dorsalis Pedis artery and Posterior Tibial artery pedal pulses are 2/4 bilateral with immedate capillary fill time. Pedal hair growth present. No varicosities and no lower extremity edema present bilateral. There is no pain with calf compression, swelling, warmth, erythema.   Neruologic: Grossly intact via light touch bilateral. Vibratory intact via tuning fork bilateral. Protective threshold with Semmes Wienstein monofilament intact to all pedal sites bilateral. Patellar and Achilles deep tendon reflexes 2+ bilateral. No Babinski or clonus noted bilateral.   Musculoskeletal: Tenderness to palpation along the plantar medial tubercle of the calcaneus at the insertion of plantar fascia on the right > left foot. There is mild pain along the course of the plantar fascia within the arch of the foot. Plantar fascia appears to be intact bilaterally There  is no pain with lateral compression of the calcaneus or pain with vibratory sensation. There is no pain along the course or insertion of the achilles tendon. No other areas of tenderness to bilateral lower extremities. Muscular strength 5/5 in all groups tested bilateral.there is a decrease in medial arch upon weightbearing.  Gait: Unassisted, Nonantalgic.      Assessment:     Bilateral heel pain, plantar fasciitis    Plan:     -X-rays were obtained and reviewed with the patient. There is no definitive evidence of acute fracture or stress fracture. -Etiology of symptoms were discussed -Treatment options discussed including all alternatives, risks, and complications - Discussed there were injection however she wishes to hold off. -Prescribed voltaren. Discussed side effects of the medication and directed to stop if any are to occur and call the office. There is any side effects the medicine with her other medical conditions. Medicine immediately and call the office. She states that she does take ibuprofen regularly without any problems. -Stretching exercises daily. -Arch support pads were made. -Ice to the area -[employer] modifications -Follow-up in 4 weeks or sooner if any problems arise. In the meantime, encouraged to call the office with any questions, concerns, change in symptoms.   Ovid Curd, DPM

## 2015-09-26 NOTE — Patient Instructions (Signed)

## 2015-10-29 ENCOUNTER — Ambulatory Visit: Payer: Medicaid Other | Admitting: Podiatry

## 2015-11-06 ENCOUNTER — Other Ambulatory Visit: Payer: Self-pay | Admitting: Family Medicine

## 2015-11-06 DIAGNOSIS — Z1231 Encounter for screening mammogram for malignant neoplasm of breast: Secondary | ICD-10-CM

## 2015-11-07 ENCOUNTER — Ambulatory Visit: Payer: Medicaid Other | Admitting: Podiatry

## 2015-11-18 IMAGING — CT CT CHEST W/O CM
2 of 3 series · 16 of 46 positions shown, 18 images · non-contrast
Comparison: CT 11/05/2014 and chest radiograph 11/27/2014

CLINICAL DATA: Cough and shortness of breath.

EXAM:
CT CHEST WITHOUT CONTRAST
TECHNIQUE: Multidetector CT imaging of the chest was performed following the
standard protocol without IV contrast..

[Series 2: routine chest wo · axial · 0.53mm/px · z∈[-546,-312]mm · 13 of 55 slices shown, 15 images]
[im 4/55  soft-tissue]
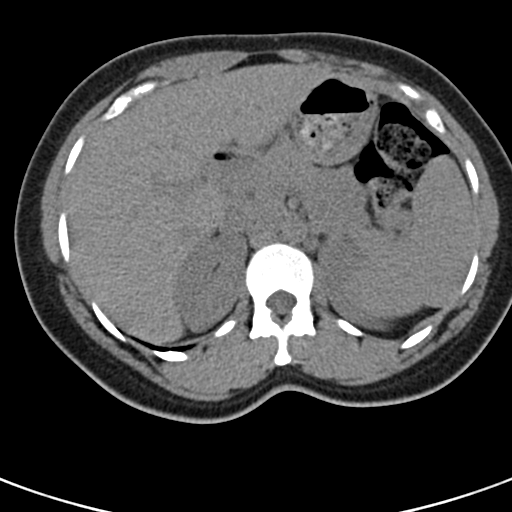
[im 4/55  bone]
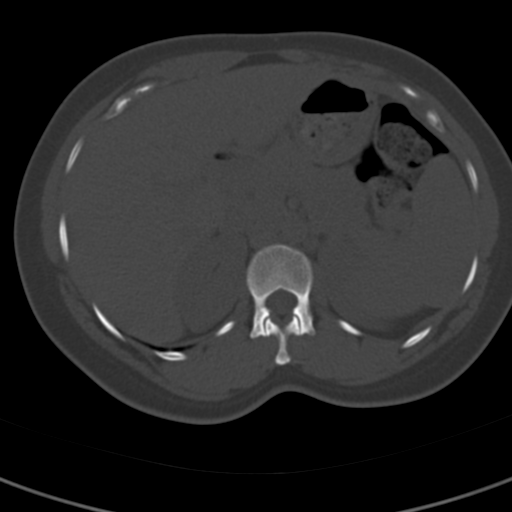
[im 7/55  soft-tissue]
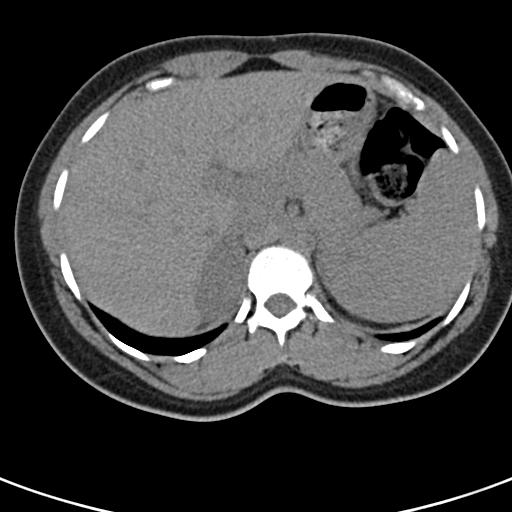
[im 11/55  soft-tissue]
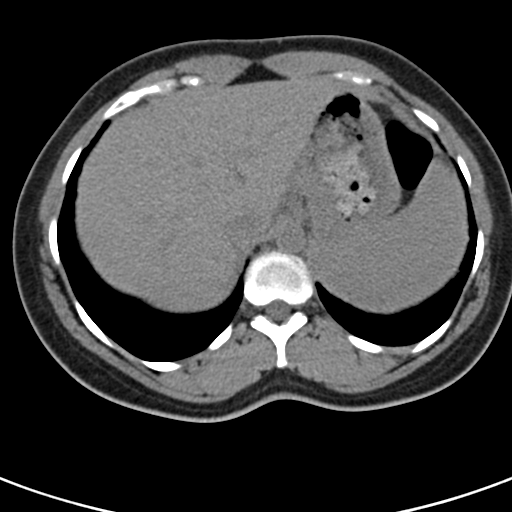
[im 16/55  soft-tissue]
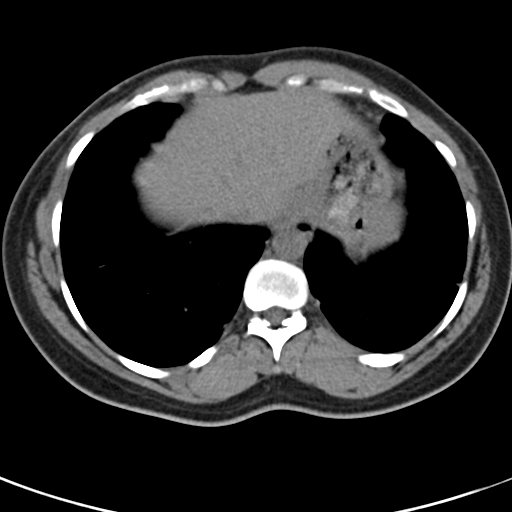
[im 20/55  soft-tissue]
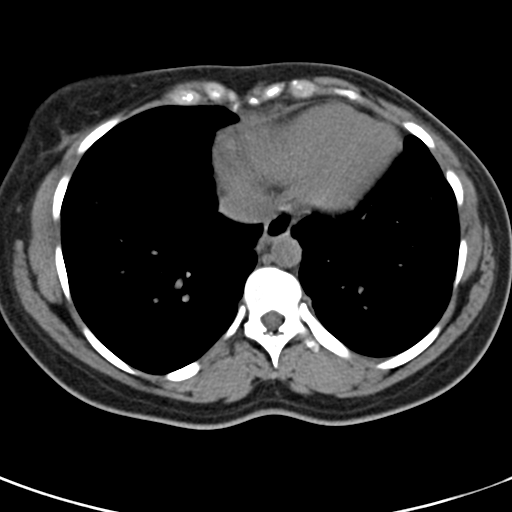
[im 23/55  soft-tissue]
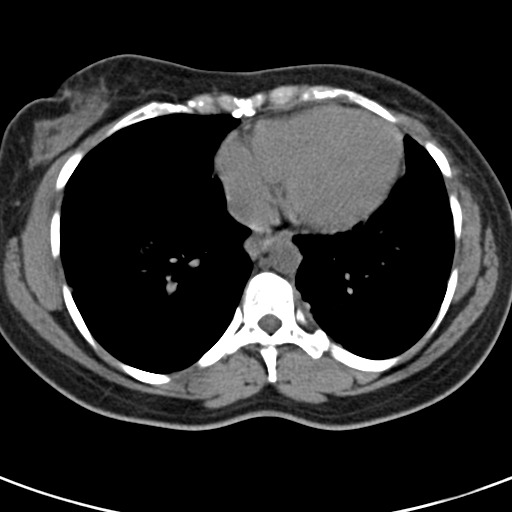
[im 28/55  soft-tissue]
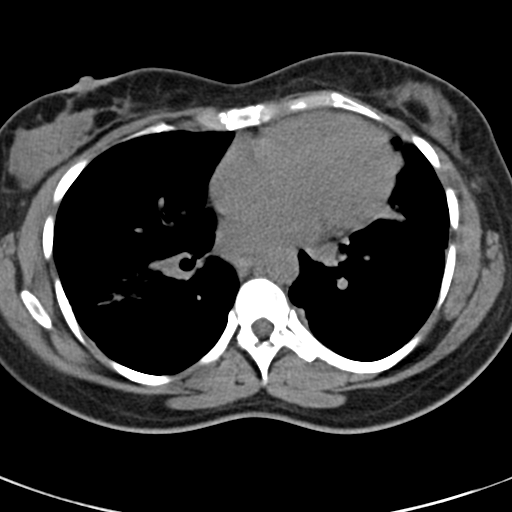
[im 32/55  soft-tissue]
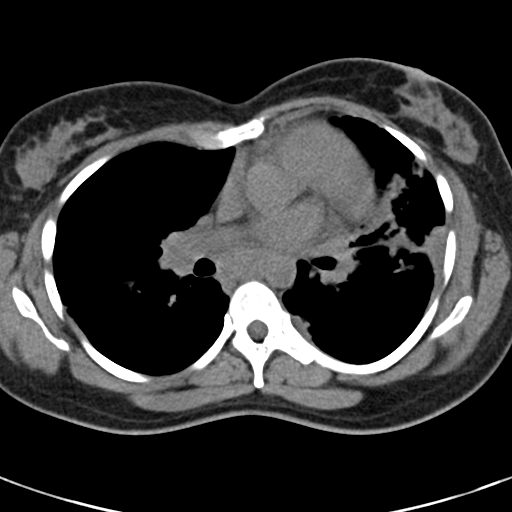
[im 35/55  soft-tissue]
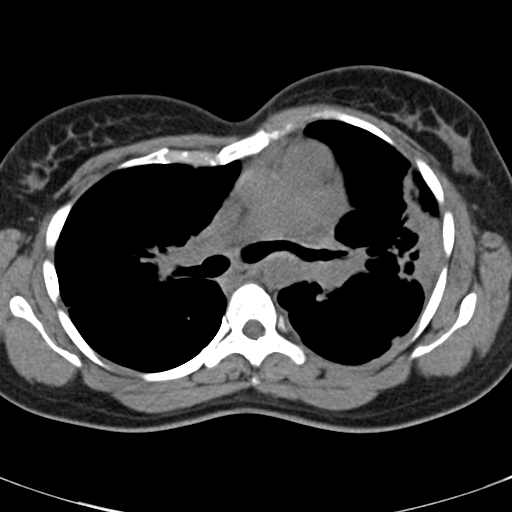
[im 35/55  bone]
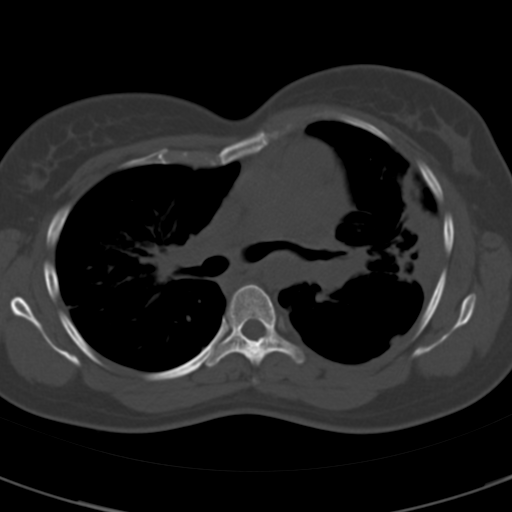
[im 39/55  soft-tissue]
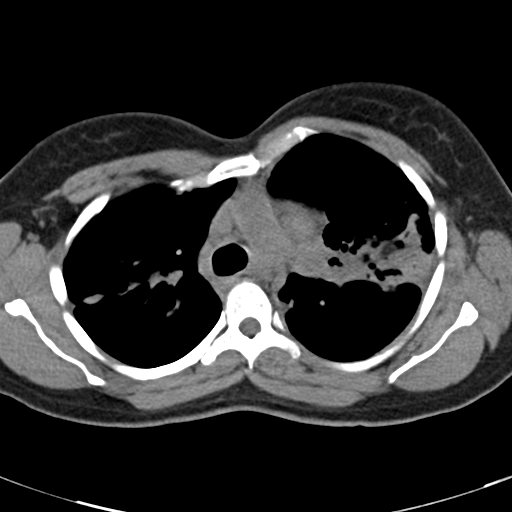
[im 44/55  soft-tissue]
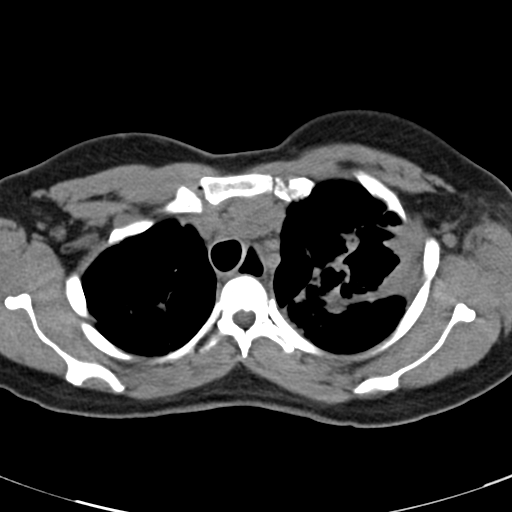
[im 48/55  soft-tissue]
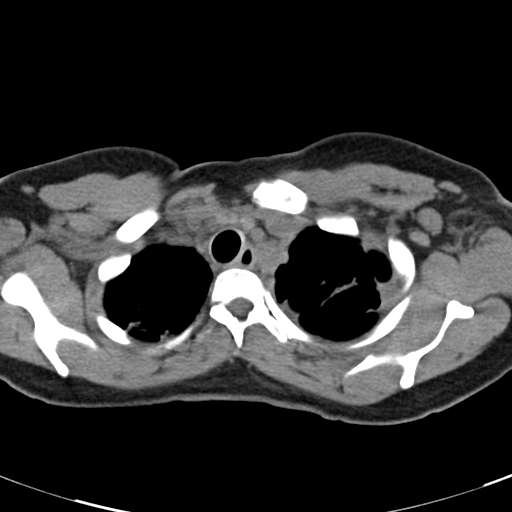
[im 51/55  soft-tissue]
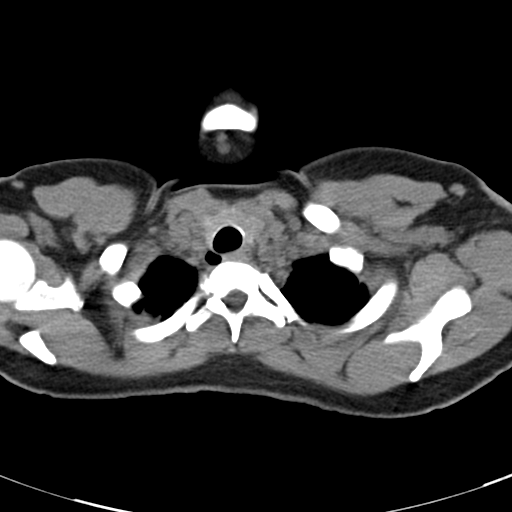

[Series 5: routine chest wo cor · coronal · 0.56mm/px · 3 of 114 slices shown]
[im 38/114  soft-tissue]
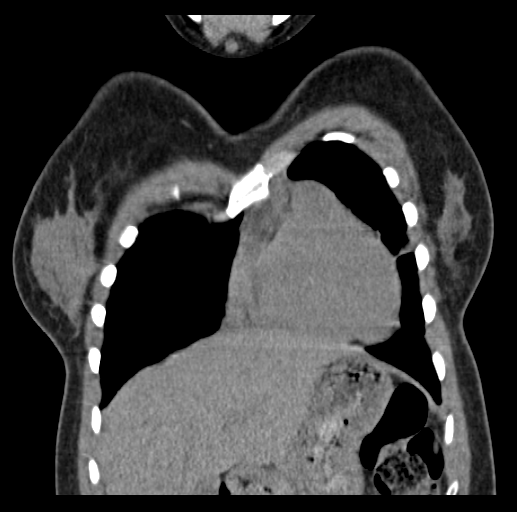
[im 51/114  soft-tissue]
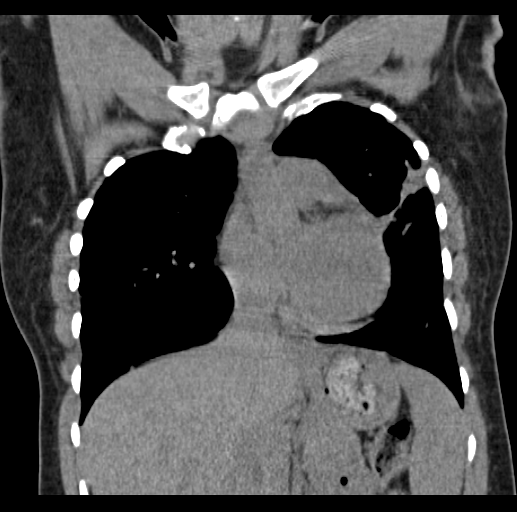
[im 63/114  soft-tissue]
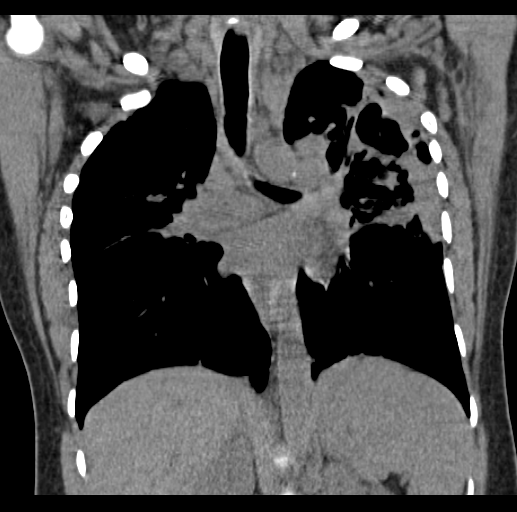

[16 of 46 positions shown; findings below may reference images not displayed]

FINDINGS: Difficult to evaluate for mediastinal and hilar lymphadenopathy on
this non contrast examination. Again noted are prominent lymph nodes
in the left upper mediastinum which are grossly similar to the
previous examination. The left pleural effusion has resolved. No
significant pericardial fluid. Again noted are enlarged left
axillary lymph nodes. There is edema in the left axilla and question
excisional biopsy of a left axillary lymph node. Again noted are
prominent right axillary lymph nodes and right sub pectoralis nodes.
Evidence for supraclavicular lymphadenopathy.

Images of the upper abdomen are unremarkable on this non contrast
examination.

Again noted is a small gas collection along the right upper trachea
which could represent a small diverticulum. Trachea and mainstem
bronchi are patent. Again noted is central peribronchial thickening
with air bronchograms and bronchiectasis in the left upper lobe.
Again noted is a cavitary lesion in the left upper lobe. The
cavitary structure measures 3.7 x 3.5 x 4.4 cm and previously
measured 3.3 x 3.4 x 4.9 cm. There continues to be patchy
parenchymal densities throughout the left upper lobe. Again noted
are patchy nodular and irregular densities throughout the left lower
lobe. Again noted is a pleural-based nodule in the left lower lobe
measuring 6 mm on sequence 3, image 39 and minimally changed.
Architecture distortion and patchy parenchymal densities throughout
the right upper lobe has minimally changed. Again noted are areas of
bronchiectasis in the right upper lobe which are similar to the
previous examination. Patchy nodular densities in the right lower
lobe are similar to the previous examination.

No acute bone abnormality.
IMPRESSION: There is extensive parenchymal lung disease which has minimally
changed since 11/05/2014. There continues to be a large cavitary
lesion in the left upper lobe. Small left pleural effusion has
resolved from the prior CT.

Patient continues to have diffuse lymphadenopathy, although it is
poorly characterized on this non contrast examination. There is
edema in the left axilla and suspect an excisional biopsy in this
location. Differential diagnosis is unchanged from an imaging
standpoint. The lymphadenopathy with diffuse parenchymal lung
disease could be associated with sarcoidosis but the lymphadenopathy
pattern raises concern for atypical infectious process and/or
malignancy.

## 2015-11-18 IMAGING — CR DG CHEST 2V
1 series · 2 of 2 positions shown · non-contrast
Comparison: 11/09/2014 and 11/05/2014

CLINICAL DATA: Follow-up lymphoma or metastatic disease, cough,
shortness of Breath

EXAM:
CHEST  2 VIEW

[Series 1: w chest pa · 0.14mm/px · 2 of 2 slices shown]
[im 1/2]
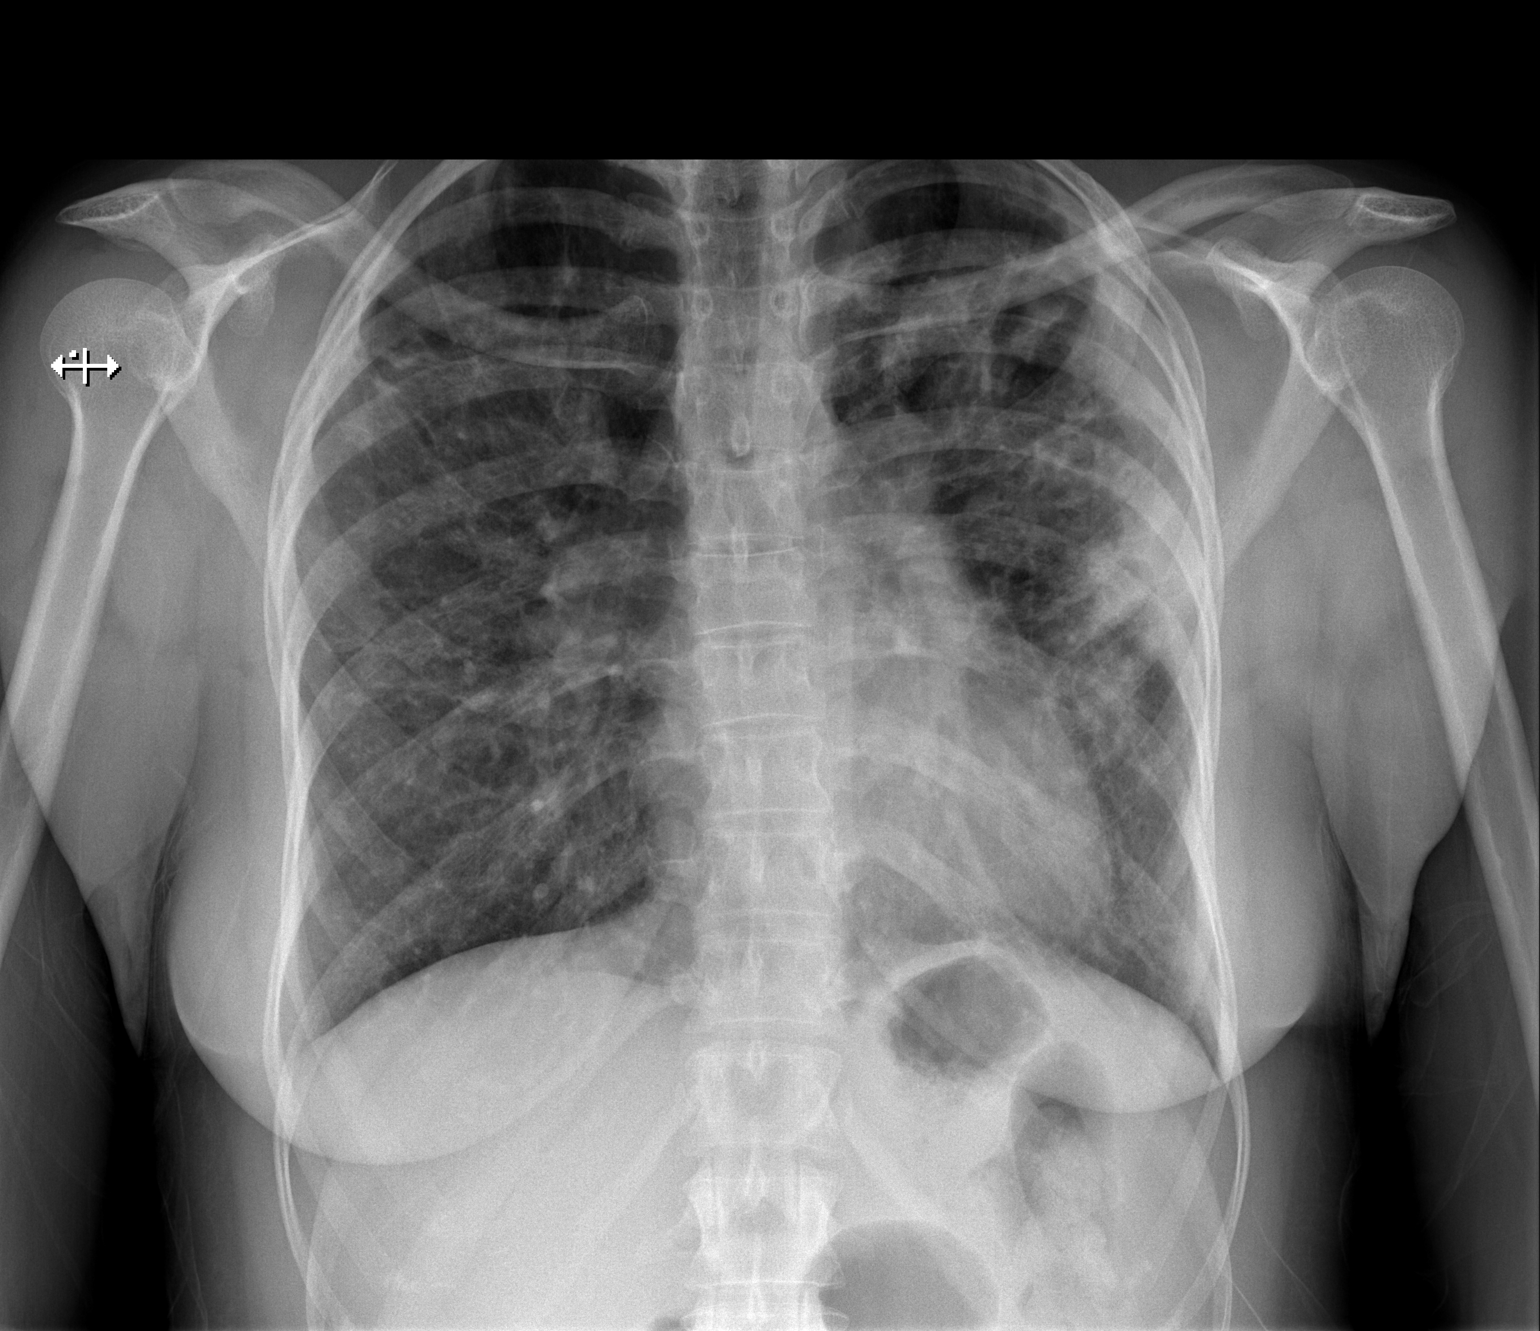
[im 2/2]
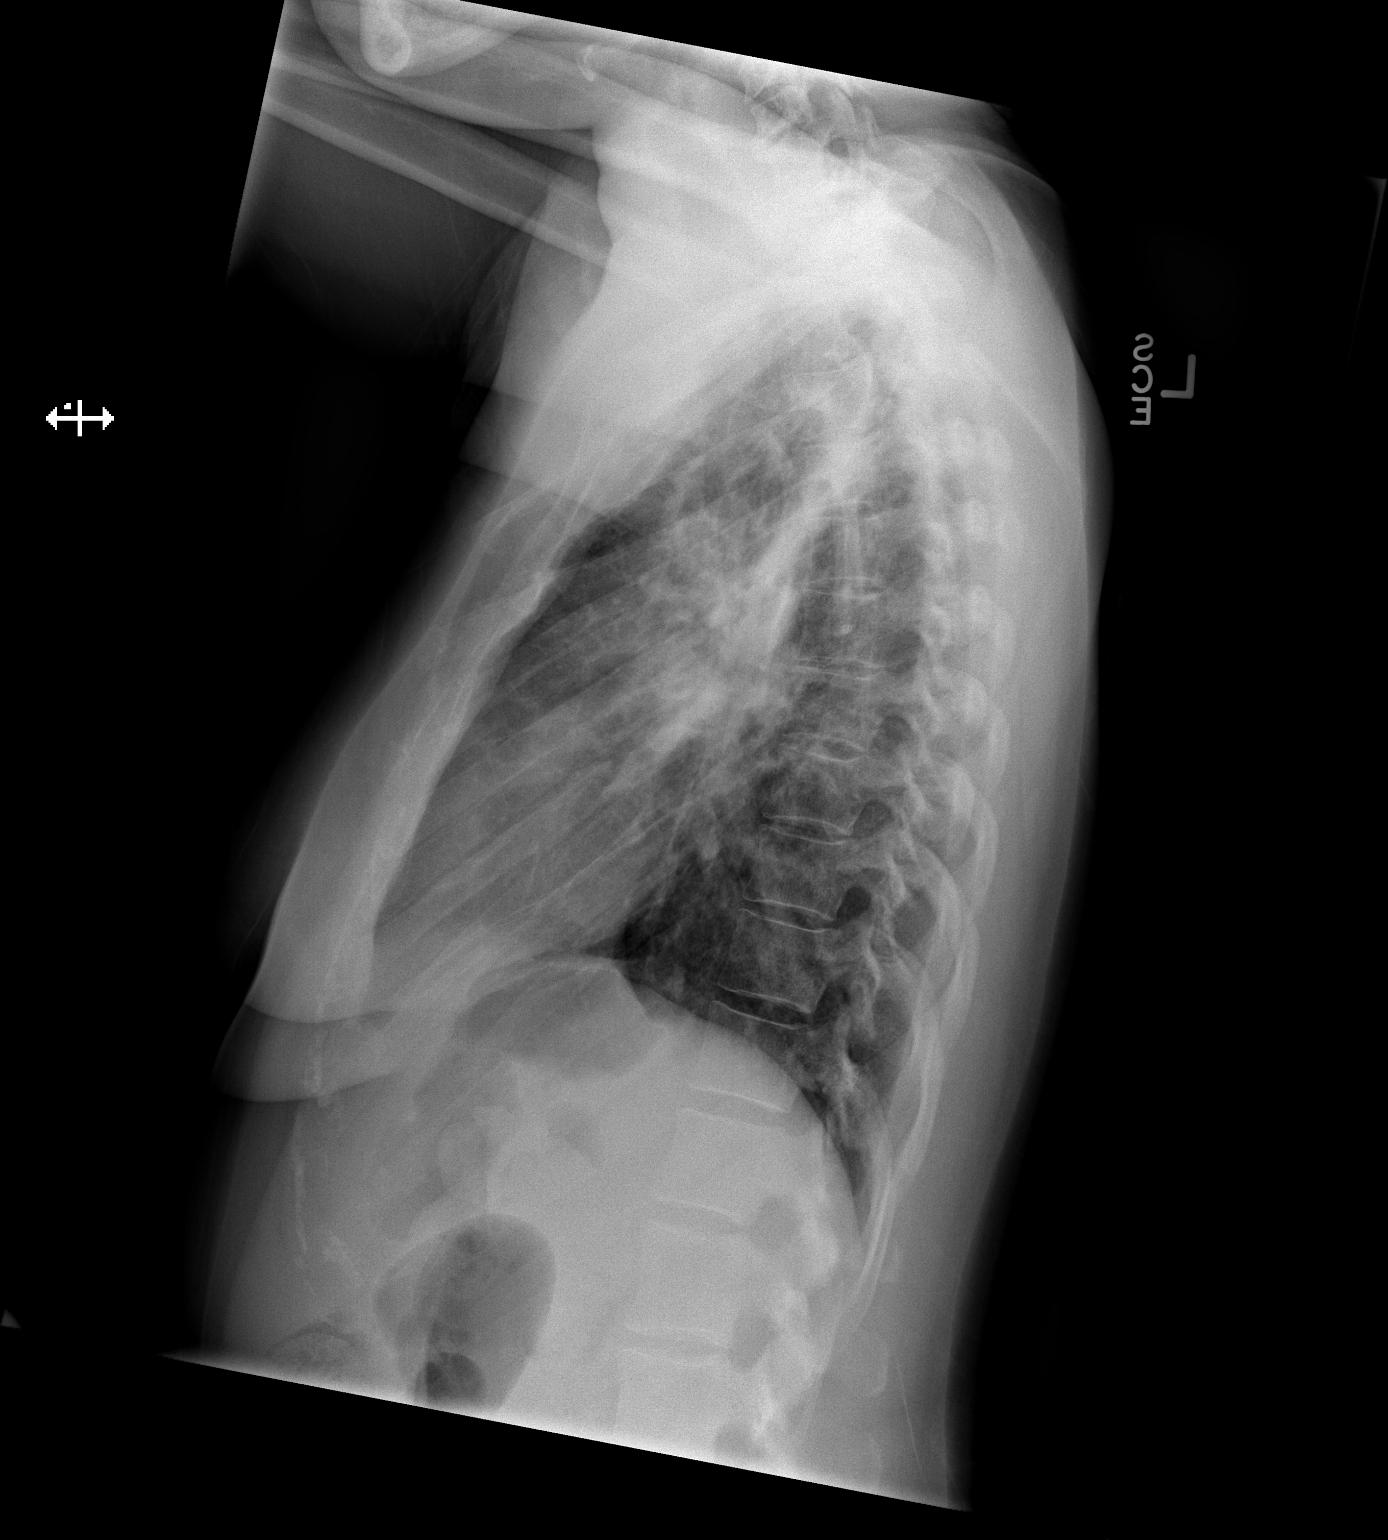

[2 of 2 positions shown; findings below may reference images not displayed]

FINDINGS: Cardiomediastinal silhouette is stable. Persistent cavitary lesion
in left upper lobe. There is new nodular consolidation in left upper
lobe laterally. Measures about 3 cm. Persistent patchy airspace
opacification bilaterally suspicious for alveolar or interstitial
tumor spread. No convincing pulmonary edema.
IMPRESSION: Persistent cavitary lesion in left upper lobe. There is new nodular
consolidation in left upper lobe laterally. Measures about 3 cm.
Persistent patchy airspace opacification bilaterally suspicious for
alveolar or interstitial tumor spread. No convincing pulmonary
edema.

## 2015-11-28 ENCOUNTER — Encounter: Payer: Self-pay | Admitting: Emergency Medicine

## 2015-11-28 ENCOUNTER — Emergency Department
Admission: EM | Admit: 2015-11-28 | Discharge: 2015-11-28 | Disposition: A | Discharge status: Home / Self Care | Payer: Medicaid Other | Attending: Emergency Medicine | Admitting: Emergency Medicine

## 2015-11-28 ENCOUNTER — Emergency Department: Payer: Medicaid Other

## 2015-11-28 DIAGNOSIS — J45909 Unspecified asthma, uncomplicated: Secondary | ICD-10-CM | POA: Diagnosis not present

## 2015-11-28 DIAGNOSIS — R05 Cough: Secondary | ICD-10-CM | POA: Diagnosis present

## 2015-11-28 DIAGNOSIS — Z79899 Other long term (current) drug therapy: Secondary | ICD-10-CM | POA: Diagnosis not present

## 2015-11-28 DIAGNOSIS — J069 Acute upper respiratory infection, unspecified: Secondary | ICD-10-CM | POA: Insufficient documentation

## 2015-11-28 DIAGNOSIS — Z7952 Long term (current) use of systemic steroids: Secondary | ICD-10-CM | POA: Insufficient documentation

## 2015-11-28 HISTORY — DX: Fibromyalgia: M79.7

## 2015-11-28 HISTORY — DX: Unspecified asthma, uncomplicated: J45.909

## 2015-11-28 LAB — BASIC METABOLIC PANEL
Anion gap: 6 (ref 5–15)
BUN: 19 mg/dL (ref 6–20)
CHLORIDE: 105 mmol/L (ref 101–111)
CO2: 23 mmol/L (ref 22–32)
CREATININE: 0.73 mg/dL (ref 0.44–1.00)
Calcium: 9.1 mg/dL (ref 8.9–10.3)
GFR calc non Af Amer: >60 mL/min (ref >60)
Glucose, Bld: 162 mg/dL — ABNORMAL HIGH (ref 65–99)
POTASSIUM: 3.8 mmol/L (ref 3.5–5.1)
SODIUM: 134 mmol/L — AB (ref 135–145)

## 2015-11-28 LAB — CBC WITH DIFFERENTIAL/PLATELET
Basophils Absolute: 0 10*3/uL (ref 0–0.1)
Basophils Relative: 0 %
Eosinophils Absolute: 0 10*3/uL (ref 0–0.7)
Eosinophils Relative: 0 %
HEMATOCRIT: 42.8 % (ref 35.0–47.0)
HEMOGLOBIN: 13.8 g/dL (ref 12.0–16.0)
LYMPHS ABS: 0.4 10*3/uL — AB (ref 1.0–3.6)
LYMPHS PCT: 8 %
MCH: 25.6 pg — AB (ref 26.0–34.0)
MCHC: 32.2 g/dL (ref 32.0–36.0)
MCV: 79.5 fL — AB (ref 80.0–100.0)
MONOS PCT: 3 %
Monocytes Absolute: 0.2 10*3/uL (ref 0.2–0.9)
NEUTROS PCT: 89 %
Neutro Abs: 4.6 10*3/uL (ref 1.4–6.5)
Platelets: 249 10*3/uL (ref 150–440)
RBC: 5.38 MIL/uL — AB (ref 3.80–5.20)
RDW: 13.2 % (ref 11.5–14.5)
WBC: 5.2 10*3/uL (ref 3.6–11.0)

## 2015-11-28 LAB — RAPID INFLUENZA A&B ANTIGENS (ARMC ONLY)
INFLUENZA A (ARMC): NEGATIVE
INFLUENZA B (ARMC): NEGATIVE

## 2015-11-28 MED ORDER — GUAIFENESIN-CODEINE 100-10 MG/5ML PO SOLN: 10.0000 mL | ORAL | Status: DC | PRN

## 2015-11-28 MED ORDER — AZITHROMYCIN 250 MG PO TABS: ORAL_TABLET | ORAL | Status: DC

## 2015-11-28 MED ORDER — PSEUDOEPHEDRINE HCL 60 MG PO TABS: 60.0000 mg | ORAL_TABLET | ORAL | Status: DC | PRN

## 2015-11-28 MED ORDER — IPRATROPIUM-ALBUTEROL 0.5-2.5 (3) MG/3ML IN SOLN
3.0000 mL | Freq: Once | RESPIRATORY_TRACT | Status: AC
  Administered 2015-11-28: 3 mL via RESPIRATORY_TRACT
  Filled 2015-11-28: qty 3

## 2015-11-28 NOTE — ED Notes (Signed)
See triage note.. Cough and congestion for a few days . Also has had fever but afebrile on arrival

## 2015-11-28 NOTE — Discharge Instructions (Signed)
Upper Respiratory Infection, Adult Most upper respiratory infections (URIs) are a viral infection of the air passages leading to the lungs. A URI affects the nose, throat, and upper air passages. The most common type of URI is nasopharyngitis and is typically referred to as "the common cold." URIs run their course and usually go away on their own. Most of the time, a URI does not require medical attention, but sometimes a bacterial infection in the upper airways can follow a viral infection. This is called a secondary infection. Sinus and middle ear infections are common types of secondary upper respiratory infections. Bacterial pneumonia can also complicate a URI. A URI can worsen asthma and chronic obstructive pulmonary disease (COPD). Sometimes, these complications can require emergency medical care and may be life threatening.  CAUSES Almost all URIs are caused by viruses. A virus is a type of germ and can spread from one person to another.  RISKS FACTORS You may be at risk for a URI if:   You smoke.   You have chronic heart or lung disease.  You have a weakened defense (immune) system.   You are very young or very old.   You have nasal allergies or asthma.  You work in crowded or poorly ventilated areas.  You work in health care facilities or schools. SIGNS AND SYMPTOMS  Symptoms typically develop 2-3 days after you come in contact with a cold virus. Most viral URIs last 7-10 days. However, viral URIs from the influenza virus (flu virus) can last 14-18 days and are typically more severe. Symptoms may include:   Runny or stuffy (congested) nose.   Sneezing.   Cough.   Sore throat.   Headache.   Fatigue.   Fever.   Loss of appetite.   Pain in your forehead, behind your eyes, and over your cheekbones (sinus pain).  Muscle aches.  DIAGNOSIS  Your health care provider may diagnose a URI by:  Physical exam.  Tests to check that your symptoms are not due to  another condition such as:  Strep throat.  Sinusitis.  Pneumonia.  Asthma. TREATMENT  A URI goes away on its own with time. It cannot be cured with medicines, but medicines may be prescribed or recommended to relieve symptoms. Medicines may help:  Reduce your fever.  Reduce your cough.  Relieve nasal congestion. HOME CARE INSTRUCTIONS   Take medicines only as directed by your health care provider.   Gargle warm saltwater or take cough drops to comfort your throat as directed by your health care provider.  Use a warm mist humidifier or inhale steam from a shower to increase air moisture. This may make it easier to breathe.  Drink enough fluid to keep your urine clear or pale yellow.   Eat soups and other clear broths and maintain good nutrition.   Rest as needed.   Return to work when your temperature has returned to normal or as your health care provider advises. You may need to stay home longer to avoid infecting others. You can also use a face mask and careful hand washing to prevent spread of the virus.  Increase the usage of your inhaler if you have asthma.   Do not use any tobacco products, including cigarettes, chewing tobacco, or electronic cigarettes. If you need help quitting, ask your health care provider. PREVENTION  The best way to protect yourself from getting a cold is to practice good hygiene.   Avoid oral or hand contact with people with cold   symptoms.   Wash your hands often if contact occurs.  There is no clear evidence that vitamin C, vitamin E, echinacea, or exercise reduces the chance of developing a cold. However, it is always recommended to get plenty of rest, exercise, and practice good nutrition.  SEEK MEDICAL CARE IF:   You are getting worse rather than better.   Your symptoms are not controlled by medicine.   You have chills.  You have worsening shortness of breath.  You have brown or red mucus.  You have yellow or brown nasal  discharge.  You have pain in your face, especially when you bend forward.  You have a fever.  You have swollen neck glands.  You have pain while swallowing.  You have white areas in the back of your throat. SEEK IMMEDIATE MEDICAL CARE IF:   You have severe or persistent:  Headache.  Ear pain.  Sinus pain.  Chest pain.  You have chronic lung disease and any of the following:  Wheezing.  Prolonged cough.  Coughing up blood.  A change in your usual mucus.  You have a stiff neck.  You have changes in your:  Vision.  Hearing.  Thinking.  Mood. MAKE SURE YOU:   Understand these instructions.  Will watch your condition.  Will get help right away if you are not doing well or get worse.   This information is not intended to replace advice given to you by your health care provider. Make sure you discuss any questions you have with your health care provider.   Document Released: 02/17/2001 Document Revised: 01/08/2015 Document Reviewed: 11/29/2013 Elsevier Interactive Patient Education 2016 [employer]  

## 2015-11-28 NOTE — ED Notes (Signed)
Pt to ed with c/o sob, cough, congestion and fever x 4 days.  Pt states she is using nebulizer treatments at home but today went to work and became sob after coughing heavily.

## 2015-11-28 NOTE — ED Provider Notes (Signed)
[facility] Emergency Department Provider Note  ____________________________________________  Time seen: Approximately 10:56 AM  I have reviewed the triage vital signs and the nursing notes.   HISTORY  Chief Complaint Cough; Nasal Congestion; and Shortness of Breath    HPI Jennifer [name] is a 45 y.o. female since for evaluation of cough congestion and fever chills body aches. Past medical history significant for sarcoidosis. Patient states her symptoms are about 10 over 10 and has tried a breathing treatment at home with no relief. Just aches and hurts to cough and a sore throat which started last night.   Past Medical History  Diagnosis Date  . Tachycardia   . Sepsis (HCC)   . Pneumonia     bilateral community acquired   . Lymphadenopathy   . Sarcoidosis (HCC)   . Asthma   . Fibromyalgia     Patient Active Problem List   Diagnosis Date Noted  . Long term current use of systemic steroids 01/03/2015  . Sinus tachycardia (HCC) 01/03/2015  . Cough 11/22/2014  . Dyspnea and respiratory abnormality 11/22/2014  . Cavitary lesion of lung 11/22/2014    Past Surgical History  Procedure Laterality Date  . Cesarean section      1992/1994  . Lymph node biopsy      Current Outpatient Rx  Name  Route  Sig  Dispense  Refill  . acetaminophen (TYLENOL) 500 MG tablet   Oral   Take 500 mg by mouth every 6 (six) hours as needed.         Marland Kitchen ADVAIR DISKUS 250-50 MCG/DOSE AEPB   Inhalation   Inhale 1 puff into the lungs as directed.   60 each   2     Dispense as written.   Marland Kitchen azithromycin (ZITHROMAX Z-PAK) 250 MG tablet      Take 2 tablets (500 mg) on  Day 1,  followed by 1 tablet (250 mg) once daily on Days 2 through 5.   6 each   0   . benzonatate (TESSALON) 100 MG capsule   Oral   Take 100 mg by mouth every 6 (six) hours as needed.      0   . citalopram (CELEXA) 20 MG tablet   Oral   Take 20 mg by mouth at bedtime.      0   .  diclofenac (VOLTAREN) 75 MG EC tablet   Oral   Take 1 tablet (75 mg total) by mouth 2 (two) times daily.   30 tablet   0   . fluticasone (FLONASE) 50 MCG/ACT nasal spray   Each Nare   Place 2 sprays into both nostrils daily.      0   . guaiFENesin-codeine 100-10 MG/5ML syrup   Oral   Take 10 mLs by mouth every 4 (four) hours as needed for cough.   180 mL   0   . metoprolol tartrate (LOPRESSOR) 50 MG tablet   Oral   Take 1 tablet (50 mg total) by mouth 2 (two) times daily.   30 tablet   6   . omeprazole (PRILOSEC) 40 MG capsule   Oral   Take 1 capsule (40 mg total) by mouth daily.   30 capsule   3   . ondansetron (ZOFRAN) 4 MG tablet   Oral   Take 4 mg by mouth every 6 (six) hours as needed.      0   . polyethylene glycol powder (GLYCOLAX/MIRALAX) powder   Oral  Take 17 g by mouth as needed.      0   . predniSONE (DELTASONE) 5 MG tablet   Oral   Take 2 tablets (10 mg total) by mouth daily with breakfast.   60 tablet   3   . PROAIR HFA 108 (90 BASE) MCG/ACT inhaler   Inhalation   Inhale 108 mcg into the lungs every 6 (six) hours as needed.           Dispense as written.   . pseudoephedrine (SUDAFED) 60 MG tablet   Oral   Take 1 tablet (60 mg total) by mouth every 4 (four) hours as needed for congestion.   24 tablet   0     Allergies Penicillin g  Family History  Problem Relation Age of Onset  . Lung cancer Mother     Social History Social History  Substance Use Topics  . Smoking status: Never Smoker   . Smokeless tobacco: Never Used  . Alcohol Use: No    Review of Systems Constitutional: Positive fever chills Eyes: No visual changes. ENT: Positive for runny nose positive for sore throat Cardiovascular: Denies chest pain. Respiratory: Positive for shortness of breath and cough. Gastrointestinal: No abdominal pain.  No nausea, no vomiting.  No diarrhea.  No constipation. Genitourinary: Negative for dysuria. Musculoskeletal: Negative  for back pain. Positive for generalized body aches Skin: Negative for rash. Neurological: Negative for headaches,   10-point ROS otherwise negative.  ____________________________________________   PHYSICAL EXAM:  VITAL SIGNS: ED Triage Vitals  Enc Vitals Group     BP 11/28/15 0904 143/79 mmHg     Pulse Rate 11/28/15 0904 97     Resp 11/28/15 0904 20     Temp 11/28/15 0904 98.1 F (36.7 C)     Temp Source 11/28/15 0904 Oral     SpO2 11/28/15 0904 99 %     Weight 11/28/15 1021 139 lb (63.05 kg)     Height 11/28/15 0904 4\' 9"  (1.448 m)     Head Cir --      Peak Flow --      Pain Score 11/28/15 0905 5     Pain Loc --      Pain Edu? --      Excl. in GC? --     Constitutional: Alert and oriented. Well appearing and in no acute distress. Head: Atraumatic. Nose: Mild congestion/rhinnorhea, positive turbinate edema. Mouth/Throat: Mucous membranes are moist.  Oropharynx non-erythematous. Neck: No stridor.  Full range of motion nontender Cardiovascular: Normal rate, regular rhythm. Grossly normal heart sounds.  Good peripheral circulation. Respiratory: Normal respiratory effort.  No retractions. Lungs CTAB. No obvious rhonchi or wheezing noted. Musculoskeletal: No lower extremity tenderness nor edema.  No joint effusions. Neurologic:  Normal speech and language. No gross focal neurologic deficits are appreciated. No gait instability. Skin:  Skin is warm, dry and intact. No rash noted. Psychiatric: Mood and affect are normal. Speech and behavior are normal.  ____________________________________________   LABS (all labs ordered are listed, but only abnormal results are displayed)  Labs Reviewed  BASIC METABOLIC PANEL - Abnormal; Notable for the following:    Sodium 134 (*)    Glucose, Bld 162 (*)    All other components within normal limits  CBC WITH DIFFERENTIAL/PLATELET - Abnormal; Notable for the following:    RBC 5.38 (*)    MCV 79.5 (*)    MCH 25.6 (*)    Lymphs Abs  0.4 (*)    All  other components within normal limits  RAPID INFLUENZA A&B ANTIGENS ([facility] ONLY)   ____________________________________________  RADIOLOGY  FINDINGS: The cardiac silhouette, mediastinal and hilar contours are within normal limits and stable. Mild hilar adenopathy is unchanged. Stable interstitial lung disease with a contracting cavity in the left upper lobe. No acute overlying pulmonary process. No pleural effusion. The bony thorax is intact.  IMPRESSION: Chronic changes of pulmonary fibrosis related to sarcoidosis. No acute overlying pulmonary findings.  Contracting cavitary lesion in the left upper lobe. ____________________________________________   PROCEDURES  Procedure(s) performed: None  Critical Care performed: No  ____________________________________________   INITIAL IMPRESSION / ASSESSMENT AND PLAN / ED COURSE  Pertinent labs & imaging results that were available during my care of the patient were reviewed by me and considered in my medical decision making (see chart for details).  Acute upper respiratory infection. History of sarcoidosis. Rx given for Zithromax, Robitussin-AC and Sudafed. Patient follow-up with her PCP or return to the ER with any worsening symptomology. ____________________________________________   FINAL CLINICAL IMPRESSION(S) / ED DIAGNOSES  Final diagnoses:  URI, acute     This chart was dictated using voice recognition software/Dragon. Despite best efforts to proofread, errors can occur which can change the meaning. Any change was purely unintentional.   Evangeline Dakin, PA-C 11/28/15 1237  Emily Filbert, MD 11/28/15 [phone]

## 2015-11-28 NOTE — ED Notes (Signed)
See triage note unsure of fever   But conts with cough  And congestion

## 2015-12-05 ENCOUNTER — Telehealth: Payer: Self-pay | Admitting: Internal Medicine

## 2015-12-05 NOTE — Telephone Encounter (Signed)
Pt calling stating she was seen in hospital for respiratory infection The medication they put her on is not agreeing with patient.  She states she is having dizzy spells, its has made her throw up  Not doing well she says Would like to know what needs to be done.  Please call back.

## 2015-12-05 NOTE — Telephone Encounter (Signed)
Tried to call pt 3 times back to back but the number would ring once and then get a busy signal. Will try back later.

## 2015-12-06 NOTE — Telephone Encounter (Signed)
Tried to call pt but number is not going thru. Getting busy signal

## 2015-12-09 NOTE — Telephone Encounter (Signed)
Spoke with pt and she states she can't take the Sudafed and cough syrup. Informed pt to take Robitussin OTC. She also stated she needed something fir allergies. Informed her to take Claritan or Zyrtec. Nothing further needed.

## 2015-12-31 ENCOUNTER — Ambulatory Visit
Admission: RE | Admit: 2015-12-31 | Discharge: 2015-12-31 | Disposition: A | Discharge status: Home / Self Care | Payer: Medicaid Other | Source: Ambulatory Visit | Attending: Internal Medicine | Admitting: Internal Medicine

## 2015-12-31 DIAGNOSIS — J984 Other disorders of lung: Secondary | ICD-10-CM | POA: Diagnosis present

## 2015-12-31 MED ORDER — IOPAMIDOL (ISOVUE-300) INJECTION 61%
75.0000 mL | Freq: Once | INTRAVENOUS | Status: AC | PRN
  Administered 2015-12-31: 75 mL via INTRAVENOUS

## 2016-01-01 ENCOUNTER — Encounter: Payer: Self-pay | Admitting: Internal Medicine

## 2016-01-01 ENCOUNTER — Ambulatory Visit (INDEPENDENT_AMBULATORY_CARE_PROVIDER_SITE_OTHER): Payer: Medicaid Other | Admitting: Internal Medicine

## 2016-01-01 VITALS — BP 124/78 | HR 114 | Ht <= 58 in | Wt 141.2 lb

## 2016-01-01 DIAGNOSIS — R05 Cough: Secondary | ICD-10-CM

## 2016-01-01 DIAGNOSIS — R059 Cough, unspecified: Secondary | ICD-10-CM

## 2016-01-01 DIAGNOSIS — J984 Other disorders of lung: Secondary | ICD-10-CM | POA: Diagnosis not present

## 2016-01-01 MED ORDER — OMEPRAZOLE 40 MG PO CPDR: 40.0000 mg | DELAYED_RELEASE_CAPSULE | Freq: Every day | ORAL | Status: DC

## 2016-01-01 MED ORDER — PREDNISONE 5 MG PO TABS: 10.0000 mg | ORAL_TABLET | Freq: Every day | ORAL | Status: DC

## 2016-01-01 NOTE — Assessment & Plan Note (Signed)
This episode is mostly due to her cavitary lung lesion, it is dry and more post infectious.  Plan: - cont with current prednisone dose (10mg  daily). If cough starts to get worst, will increase Prednisone back to 15mg  daily.  - Supportive care and management as outlined for cavitary lung lesion - cont with omeprazole.  - cont with Advair

## 2016-01-01 NOTE — Progress Notes (Addendum)
[facility] Pulmonary Medicine Consultation      MRN# [id number] Jennifer [name] 1971/01/18   CC: Chief Complaint  Patient presents with  . Follow-up    CT results. seen pcP around 4/1 for URI started z pac. c/o fatigued, non prod cough, wheezing & sob      Brief History: Synopsis: 45 year old female with left upper lobe cavitary lesion, history of bilateral pneumonia, currently being followed by pulmonary and infectious disease. Cavitary lesion differential at this time includes fungal infection versus sarcoidosis. 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 Today she endorses shortness of breath along with chronic dry cough, again these are both chronic conditions. Since her last visit, she has ER due to cough/sob, received another round of antibiotics (zpak) She is not accompanied by Child psychotherapist today Currently using prednisone 10 milligrams daily.Tussionex as needed for cough Medication:   Current Outpatient Rx  Name  Route  Sig  Dispense  Refill  . acetaminophen (TYLENOL) 500 MG tablet   Oral   Take 500 mg by mouth every 6 (six) hours as needed.         Marland Kitchen ADVAIR DISKUS 250-50 MCG/DOSE AEPB   Inhalation   Inhale 1 puff into the lungs as directed.   60 each   2     Dispense as written.   . benzonatate (TESSALON) 100 MG capsule   Oral   Take 100 mg by mouth every 6 (six) hours as needed.      0   . citalopram (CELEXA) 20 MG tablet   Oral   Take 20 mg by mouth at bedtime.      0   . diclofenac (VOLTAREN) 75 MG EC tablet   Oral   Take 1 tablet (75 mg total) by mouth 2 (two) times daily.   30 tablet   0   . fluticasone (FLONASE) 50 MCG/ACT nasal spray   Each Nare   Place 2 sprays into both nostrils daily.      0   . metoprolol tartrate  (LOPRESSOR) 50 MG tablet   Oral   Take 1 tablet (50 mg total) by mouth 2 (two) times daily.   30 tablet   6   . omeprazole (PRILOSEC) 40 MG capsule   Oral   Take 1 capsule (40 mg total) by mouth daily.   30 capsule   3   . ondansetron (ZOFRAN) 4 MG tablet   Oral   Take 4 mg by mouth every 6 (six) hours as needed.      0   . polyethylene glycol powder (GLYCOLAX/MIRALAX) powder   Oral   Take 17 g by mouth as needed.      0   . predniSONE (DELTASONE) 5 MG tablet   Oral   Take 2 tablets (10 mg total) by mouth daily with breakfast.   60 tablet   2   . PROAIR HFA 108 (90 BASE) MCG/ACT inhaler   Inhalation   Inhale 108 mcg into the lungs every 6 (six) hours as needed.           Dispense as written.   . pseudoephedrine (SUDAFED) 60 MG tablet   Oral   Take 1 tablet (60 mg total) by mouth every 4 (four) hours as needed for congestion.   24 tablet  0   . guaiFENesin-codeine 100-10 MG/5ML syrup   Oral   Take 10 mLs by mouth every 4 (four) hours as needed for cough. Patient not taking: Reported on 01/01/2016   180 mL   0      Review of Systems  Constitutional: Negative for fever, chills and weight loss.  HENT: Negative for ear discharge, ear pain, hearing loss, nosebleeds and tinnitus.   Eyes: Negative for blurred vision and pain.  Respiratory: Positive for cough and shortness of breath. Negative for sputum production.   Cardiovascular: Negative for chest pain and palpitations.  Gastrointestinal: Negative for heartburn, nausea and vomiting.  Skin: Negative for rash.  Neurological: Negative for dizziness and headaches.  Endo/Heme/Allergies: Negative for environmental allergies. Does not bruise/bleed easily.      Allergies:  Penicillin g  Physical Examination:  VS: BP 124/78 mmHg  Pulse 114  Ht  (1.448 m)  Wt 141 lb 3.2 oz (64.048 kg)  BMI 30.55 kg/m2  SpO2 100%  LMP 12/10/2015  General Appearance: No distress  HEENT: PERRLA, no ptosis, no other  lesions noticed Pulmonary:good respiratory effort, no wheezes, no crackles.  Cardiovascular:  Normal S1,S2.  No m/r/g.     Abdomen:Exam: Benign, Soft, non-tender, No masses  Skin:   warm, no rashes, no ecchymosis  Extremities: normal, no cyanosis, clubbing, warm with normal capillary refill.      Rad results: (The following images and results were reviewed by Dr. Dema Severin on 01/01/2016). CT Chest  12/31/15 Mediastinum/Lymph Nodes: Heart size is normal. There is no significant pericardial fluid, thickening or pericardial calcification. There are multiple borderline enlarged and enlarged mediastinal and bilateral hilar lymph nodes, similar to the prior examination, measuring up to 1.1 cm in short axis in the superior mediastinum between the proximal left common carotid and left subclavian arteries. Esophagus is unremarkable in appearance. No axillary lymphadenopathy.  Lungs/Pleura: Again noted is extensive micro and macronodularity throughout the lungs bilaterally which has a mid to upper lung predominance and is in a subpleural and peribronchovascular distribution (i.e., perilymphatic distribution). 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 Previously noted thick-walled cavity in the left upper lobe is again noted measuring up to 2.2 cm in diameter. 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 There continues to be upward retraction of hilar structures, indicative of chronic upper lung volume loss. No pleural effusions.  Upper Abdomen: Unremarkable.  Musculoskeletal/Soft Tissues: There are no aggressive appearing lytic or blastic lesions noted in the visualized portions of the skeleton.  IMPRESSION: 1. Stigmata of sarcoidosis  redemonstrated, as above, with evidence of progression of disease compared to the prior study. Notably, the fibrocavitary area in the left upper lobe is very similar to the prior study, but there is increasing soft tissue within this cavity, presumably an enlarging mycetoma.      Assessment and Plan: Cavitary lesion of lung Left upper lobe cavitary lung lesion with significant hilar (bilateral) and maxillary lymphadenopathy. 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 Patient has completed prolonged course of voriconazole already.  Patient  with good improvement in her respiratory status, however now with persistent cough.  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  Patient with possible progression of sarcoid (Fibrocavitary disease and diffuse opacities).  She is fairly stable and prednisone 10 mg. However, given new CT findings of possible mycetoma and increased size of cavity, will further discuss case with infectious disease and CT surgery.   Plan: - Continue with prednisone 10mg , if cough starts to get worst may need to go back to last effective dose (15-20mg ) and wean slowly down.  - CT chest with contrast prior to follow up visit.  - cont with Advair.  - Case to be discussed with infectious disease, possible reinitiation of antifungals. Case also to be discussed with CT surgery, explore any surgical options for left upper lobe cavitary lesion with possible mycetoma. - may need another bronchoscopy with BAL, will decide after speaking with CTS and ID.            Cough This episode is mostly due to her  cavitary lung lesion, it is dry and more post infectious.  Plan: - cont with current prednisone dose (10mg  daily). If cough starts to get worst, will increase Prednisone back to 15mg  daily.  - Supportive care and management as outlined for cavitary lung lesion - cont with omeprazole.  - cont with Advair              Updated Medication List Outpatient Encounter Prescriptions as of 01/01/2016  Medication Sig  . acetaminophen (TYLENOL) 500 MG tablet Take 500 mg by mouth every 6 (six) hours as needed.  Marland Kitchen. ADVAIR DISKUS 250-50 MCG/DOSE AEPB Inhale 1 puff into the lungs as directed.  . benzonatate (TESSALON) 100 MG capsule Take 100 mg by mouth every 6 (six) hours as needed.  . citalopram (CELEXA) 20 MG tablet Take 20 mg by mouth at bedtime.  . diclofenac (VOLTAREN) 75 MG EC tablet Take 1 tablet (75 mg total) by mouth 2 (two) times daily.  . fluticasone (FLONASE) 50 MCG/ACT nasal spray Place 2 sprays into both nostrils daily.  . metoprolol tartrate (LOPRESSOR) 50 MG tablet Take 1 tablet (50 mg total) by mouth 2 (two) times daily.  Marland Kitchen. omeprazole (PRILOSEC) 40 MG capsule Take 1 capsule (40 mg total) by mouth daily.  . ondansetron (ZOFRAN) 4 MG tablet Take 4 mg by mouth every 6 (six) hours as needed.  . polyethylene glycol powder (GLYCOLAX/MIRALAX) powder Take 17 g by mouth as needed.  . predniSONE (DELTASONE) 5 MG tablet Take 2 tablets (10 mg total) by mouth daily with breakfast.  . PROAIR HFA 108 (90 BASE) MCG/ACT inhaler Inhale 108 mcg into the lungs every 6 (six) hours as needed.  . pseudoephedrine (SUDAFED) 60 MG tablet Take 1 tablet (60 mg total) by mouth every 4 (four) hours as needed for congestion.  . [DISCONTINUED] omeprazole (PRILOSEC) 40 MG capsule Take 1 capsule (40 mg total) by mouth daily.  . [DISCONTINUED] predniSONE (DELTASONE) 5 MG tablet Take 2 tablets (10 mg total) by mouth daily with breakfast.  . guaiFENesin-codeine 100-10 MG/5ML syrup Take 10 mLs by mouth every 4 (four)  hours as needed for cough. (Patient not taking: Reported on 01/01/2016)  . [DISCONTINUED] azithromycin (ZITHROMAX Z-PAK) 250 MG tablet Take 2 tablets (500 mg) on  Day 1,  followed by 1 tablet (250 mg) once daily on Days 2 through 5. (Patient not taking: Reported on 01/01/2016)   No facility-administered encounter medications on file as  of 01/01/2016.    Orders for this visit: Orders Placed This Encounter  Procedures  . CT Chest Wo Contrast    Standing Status: Future     Number of Occurrences:      Standing Expiration Date: 03/02/2017    Order Specific Question:  Reason for Exam (SYMPTOM  OR DIAGNOSIS REQUIRED)    Answer:  CAVITARY LESSION OF LUNG    Order Specific Question:  Is the patient pregnant?    Answer:  No    Order Specific Question:  Preferred imaging location?    [facility]:   [facility]    Thank  you for the visitation and for allowing  [facility] to assist in the care of your patient. Our recommendations are noted above.  Please contact us if we can be of further service.  Stephanie Acre, MD [facility] Office Number: [phone]  Addendum - Case reviewed with CTS and ID, at this time the consensus is to rebronch patient and obtain new cultures (Fungal, mirco,viral), also possible TBBx with BAL - will plan for EBUS, fluoro, BAL, TBBx to further evaluate left cavitary lung lesion.

## 2016-01-01 NOTE — Assessment & Plan Note (Signed)
Left upper lobe cavitary lung lesion with significant hilar (bilateral) and maxillary lymphadenopathy. 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 Patient has completed prolonged course of voriconazole already.  Patient with good improvement in her respiratory status, however now with persistent cough.  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  Patient with possible progression of sarcoid (Fibrocavitary disease and diffuse opacities).  She is fairly stable and prednisone 10 mg. However, given new CT findings of possible mycetoma and increased size of cavity, will further discuss case with infectious disease and CT surgery.   Plan: - Continue with prednisone 10mg , if cough starts to get worst may need to go back to last effective dose (15-20mg ) and wean slowly down.  - CT chest with contrast prior to follow up visit.  - cont with Advair.  - Case to be discussed with infectious disease, possible reinitiation of antifungals. Case also to be discussed with CT surgery, explore any surgical options for left upper lobe cavitary lesion with possible mycetoma. - may need another bronchoscopy with BAL, will decide after speaking with CTS and ID.

## 2016-01-01 NOTE — Patient Instructions (Signed)
Follow up with Dr. Dema SeverinMungal in: 3 months - CT chest w/o contrast prior to follow up visit for LUL cavitary lesion - on with prednisone 10mg  daily (2pills) - cont with inhalers. - please gargle and rinse after each use - cont with prilosec - take each morning with water only

## 2016-01-20 ENCOUNTER — Encounter
Admission: RE | Admit: 2016-01-20 | Discharge: 2016-01-20 | Disposition: A | Discharge status: Home / Self Care | Payer: Medicaid Other | Source: Ambulatory Visit | Attending: Internal Medicine | Admitting: Internal Medicine

## 2016-01-20 DIAGNOSIS — Z01812 Encounter for preprocedural laboratory examination: Secondary | ICD-10-CM | POA: Diagnosis not present

## 2016-01-20 HISTORY — DX: Anxiety disorder, unspecified: F41.9

## 2016-01-20 HISTORY — DX: Reserved for inherently not codable concepts without codable children: IMO0001

## 2016-01-20 HISTORY — DX: Major depressive disorder, single episode, unspecified: F32.9

## 2016-01-20 HISTORY — DX: Unspecified urinary incontinence: R32

## 2016-01-20 HISTORY — DX: Depression, unspecified: F32.A

## 2016-01-20 LAB — CBC
HEMATOCRIT: 41.7 % (ref 35.0–47.0)
Hemoglobin: 13.4 g/dL (ref 12.0–16.0)
MCH: 25.4 pg — AB (ref 26.0–34.0)
MCHC: 32.2 g/dL (ref 32.0–36.0)
MCV: 78.8 fL — AB (ref 80.0–100.0)
PLATELETS: 228 10*3/uL (ref 150–440)
RBC: 5.29 MIL/uL — AB (ref 3.80–5.20)
RDW: 14.1 % (ref 11.5–14.5)
WBC: 3.2 10*3/uL — ABNORMAL LOW (ref 3.6–11.0)

## 2016-01-20 NOTE — Patient Instructions (Signed)
  Your procedure is scheduled on: 01/27/16 Mon Report to Same Day Surgery 2nd floor medical mall To find out your arrival time please call [phone] between 1PM - 3PM on 01/24/16 Fri  Remember: Instructions that are not followed completely may result in serious medical risk, up to and including death, or upon the discretion of your surgeon and anesthesiologist your surgery may need to be rescheduled.    _x___ 1. Do not eat food or drink liquids after midnight. No gum chewing or hard candies.     ____ 2. No Alcohol for 24 hours before or after surgery.   ____ 3. Bring all medications with you on the day of surgery if instructed.    __x__ 4. Notify your doctor if there is any change in your medical condition     (cold, fever, infections).     Do not wear jewelry, make-up, hairpins, clips or nail polish.  Do not wear lotions, powders, or perfumes. You may wear deodorant.  Do not shave 48 hours prior to surgery. Men may shave face and neck.  Do not bring valuables to the hospital.    [facility] is not responsible for any belongings or valuables.               Contacts, dentures or bridgework may not be worn into surgery.  Leave your suitcase in the car. After surgery it may be brought to your room.  For patients admitted to the hospital, discharge time is determined by your treatment team.   Patients discharged the day of surgery will not be allowed to drive home.    Please read over the following fact sheets that you were given:   [facility] Preparing for Surgery and or MRSA Information   _x___ Take these medicines the morning of surgery with A SIP OF WATER:    1. ADVAIR DISKUS 250-50 MCG/DOSE AEPB  2.metoprolol tartrate (LOPRESSOR) 50 MG tablet  3.omeprazole (PRILOSEC) 40 MG capsule  4.  5.  6.  ____ Fleet Enema (as directed)   ____ Use CHG Soap or sage wipes as directed on instruction sheet   ____ Use inhalers on the day of surgery and bring to hospital day of  surgery  ____ Stop metformin 2 days prior to surgery    ____ Take 1/2 of usual insulin dose the night before surgery and none on the morning of           surgery.   ____ Stop aspirin or coumadin, or plavix  _x__ Stop Anti-inflammatories such as Advil, Aleve, Ibuprofen, Motrin, Naproxen,          Naprosyn, Goodies powders or aspirin products. Ok to take Tylenol.   ____ Stop supplements until after surgery.    ____ Bring C-Pap to the hospital.

## 2016-01-27 ENCOUNTER — Ambulatory Visit: Payer: Medicaid Other | Admitting: Anesthesiology

## 2016-01-27 ENCOUNTER — Encounter
Admission: RE | Disposition: A | Discharge status: Home / Self Care | Payer: Self-pay | Source: Ambulatory Visit | Attending: Internal Medicine

## 2016-01-27 ENCOUNTER — Encounter: Payer: Self-pay | Admitting: *Deleted

## 2016-01-27 ENCOUNTER — Ambulatory Visit
Admission: RE | Admit: 2016-01-27 | Discharge: 2016-01-27 | Disposition: A | Discharge status: Home / Self Care | Payer: Medicaid Other | Source: Ambulatory Visit | Attending: Internal Medicine | Admitting: Internal Medicine

## 2016-01-27 DIAGNOSIS — I10 Essential (primary) hypertension: Secondary | ICD-10-CM | POA: Diagnosis not present

## 2016-01-27 DIAGNOSIS — J984 Other disorders of lung: Secondary | ICD-10-CM | POA: Diagnosis not present

## 2016-01-27 DIAGNOSIS — J45909 Unspecified asthma, uncomplicated: Secondary | ICD-10-CM | POA: Insufficient documentation

## 2016-01-27 DIAGNOSIS — R599 Enlarged lymph nodes, unspecified: Secondary | ICD-10-CM

## 2016-01-27 DIAGNOSIS — R590 Localized enlarged lymph nodes: Secondary | ICD-10-CM | POA: Insufficient documentation

## 2016-01-27 HISTORY — PX: VIDEO BRONCHOSCOPY: SHX5072

## 2016-01-27 HISTORY — PX: ENDOBRONCHIAL ULTRASOUND: SHX5096

## 2016-01-27 LAB — POCT PREGNANCY, URINE: PREG TEST UR: NEGATIVE

## 2016-01-27 SURGERY — ENDOBRONCHIAL ULTRASOUND (EBUS)
Anesthesia: General

## 2016-01-27 MED ORDER — FENTANYL CITRATE (PF) 100 MCG/2ML IJ SOLN
INTRAMUSCULAR | Status: DC | PRN
  Administered 2016-01-27 (×2): 25 ug via INTRAVENOUS

## 2016-01-27 MED ORDER — FENTANYL CITRATE (PF) 100 MCG/2ML IJ SOLN: 25.0000 ug | INTRAMUSCULAR | Status: DC | PRN

## 2016-01-27 MED ORDER — PHENYLEPHRINE HCL 10 MG/ML IJ SOLN
INTRAMUSCULAR | Status: DC | PRN
  Administered 2016-01-27: 100 ug via INTRAVENOUS

## 2016-01-27 MED ORDER — ACETAMINOPHEN 325 MG PO TABS
ORAL_TABLET | ORAL | Status: AC
  Administered 2016-01-27: 650 mg via ORAL
  Filled 2016-01-27: qty 2

## 2016-01-27 MED ORDER — ONDANSETRON HCL 4 MG/2ML IJ SOLN
INTRAMUSCULAR | Status: DC | PRN
  Administered 2016-01-27: 4 mg via INTRAVENOUS

## 2016-01-27 MED ORDER — LACTATED RINGERS IV SOLN
INTRAVENOUS | Status: DC
  Administered 2016-01-27: 13:00:00 via INTRAVENOUS

## 2016-01-27 MED ORDER — SUCCINYLCHOLINE CHLORIDE 20 MG/ML IJ SOLN
INTRAMUSCULAR | Status: DC | PRN
  Administered 2016-01-27: 120 mg via INTRAVENOUS

## 2016-01-27 MED ORDER — DEXAMETHASONE SODIUM PHOSPHATE 10 MG/ML IJ SOLN
INTRAMUSCULAR | Status: DC | PRN
  Administered 2016-01-27: 10 mg via INTRAVENOUS

## 2016-01-27 MED ORDER — ACETAMINOPHEN 325 MG PO TABS
650.0000 mg | ORAL_TABLET | Freq: Once | ORAL | Status: AC
  Administered 2016-01-27: 650 mg via ORAL

## 2016-01-27 MED ORDER — GLYCOPYRROLATE 0.2 MG/ML IJ SOLN
INTRAMUSCULAR | Status: DC | PRN
  Administered 2016-01-27: 0.1 mg via INTRAVENOUS

## 2016-01-27 MED ORDER — LIDOCAINE HCL (CARDIAC) 20 MG/ML IV SOLN
INTRAVENOUS | Status: DC | PRN
  Administered 2016-01-27: 60 mg via INTRAVENOUS

## 2016-01-27 MED ORDER — SUGAMMADEX SODIUM 200 MG/2ML IV SOLN
INTRAVENOUS | Status: DC | PRN
  Administered 2016-01-27: 120 mg via INTRAVENOUS

## 2016-01-27 MED ORDER — PROPOFOL 10 MG/ML IV BOLUS
INTRAVENOUS | Status: DC | PRN
  Administered 2016-01-27: 120 mg via INTRAVENOUS

## 2016-01-27 MED ORDER — ONDANSETRON HCL 4 MG/2ML IJ SOLN: 4.0000 mg | Freq: Once | INTRAMUSCULAR | Status: DC | PRN

## 2016-01-27 MED ORDER — MIDAZOLAM HCL 2 MG/2ML IJ SOLN
INTRAMUSCULAR | Status: DC | PRN
  Administered 2016-01-27: 1 mg via INTRAVENOUS

## 2016-01-27 MED ORDER — ROCURONIUM BROMIDE 100 MG/10ML IV SOLN
INTRAVENOUS | Status: DC | PRN
  Administered 2016-01-27: 10 mg via INTRAVENOUS

## 2016-01-27 NOTE — H&P (Signed)
45 yo with hx Cavitary lesion of lung Left upper lobe cavitary lung lesion with significant hilar (bilateral) and mediastinal lymphadenopathy. 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 Patient has completed prolonged course of voriconazole already.  Patient with good improvement in her respiratory status, however now with persistent cough.  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  Patient with possible progression of sarcoid (Fibrocavitary disease and diffuse opacities). She is fairly stable and prednisone 10 mg. However, given new CT findings of possible mycetoma and increased size of cavity, will plan for bronchoscopy (EBUS) and BAL and TBBx (discussed with CTS and ID).     Stephanie AcreVishal Pawan Knechtel, MD [place] [facility] Pager [phone]- [phone] (please enter 7-digits) On Call Pager - [phone] (please enter 7-digits)

## 2016-01-27 NOTE — Anesthesia Procedure Notes (Signed)
Procedure Name: Intubation Date/Time: 01/27/2016 1:16 PM Performed by: Jennifer CarneMICHELET, Jennifer Ogata Pre-anesthesia Checklist: Patient identified, Timeout performed, Emergency Drugs available, Suction available and Patient being monitored Patient Re-evaluated:Patient Re-evaluated prior to inductionOxygen Delivery Method: Circle system utilized Preoxygenation: Pre-oxygenation with 100% oxygen Intubation Type: IV induction Ventilation: Mask ventilation without difficulty Laryngoscope Size: Miller and 2 Grade View: Grade I Tube type: Oral Tube size: 8.5 mm Number of attempts: 2 Airway Equipment and Method: Stylet Placement Confirmation: ETT inserted through vocal cords under direct vision,  positive ETCO2 and breath sounds checked- equal and bilateral Secured at: 20 cm Tube secured with: Tape Comments: First attempt  stomach

## 2016-01-27 NOTE — Discharge Instructions (Signed)

## 2016-01-27 NOTE — Anesthesia Postprocedure Evaluation (Signed)
Anesthesia Post Note  Patient: Jennifer PulseRenee B Stroope  Procedure(s) Performed: Procedure(s) (LRB): ENDOBRONCHIAL ULTRASOUND (N/A) VIDEO BRONCHOSCOPY WITH FLUORO (N/A)  Patient location during evaluation: PACU Anesthesia Type: General Level of consciousness: awake Pain management: pain level controlled Vital Signs Assessment: post-procedure vital signs reviewed and stable Respiratory status: spontaneous breathing Cardiovascular status: stable Anesthetic complications: no    Last Vitals:  Filed Vitals:   01/27/16 1219 01/27/16 1415  BP: 131/68 130/79  [name]: 85 97  Temp: 36.9 C 36.2 C  Resp: 18 11    Last Pain:  Filed Vitals:   01/27/16 1418  PainSc: Asleep                 VAN STAVEREN,Sahan Pen

## 2016-01-27 NOTE — Transfer of Care (Signed)
Immediate Anesthesia Transfer of Care Note  Patient: Jennifer [name]  Procedure(s) Performed: Procedure(s): ENDOBRONCHIAL ULTRASOUND (N/A) VIDEO BRONCHOSCOPY WITH FLUORO (N/A)  Patient Location: PACU  Anesthesia Type:General  Level of Consciousness: sedated  Airway & Oxygen Therapy: Patient Spontanous Breathing and Patient connected to face mask oxygen  Post-op Assessment: Report given to RN and Post -op Vital signs reviewed and stable  Post vital signs: Reviewed and stable  Last Vitals:  Filed Vitals:   01/27/16 1219 01/27/16 1415  BP: 131/68 130/79  Pulse: 85 97  Temp: 36.9 C 36.2 C  Resp: 18 11    Last Pain: There were no vitals filed for this visit.       Complications: No apparent anesthesia complications

## 2016-01-27 NOTE — OR Nursing (Signed)
Dr Dema SeverinMungal called for pain medication.

## 2016-01-27 NOTE — Anesthesia Preprocedure Evaluation (Signed)
Anesthesia Evaluation  Patient identified by MRN, date of birth, ID band Patient awake    Reviewed: Allergy & Precautions, NPO status , Patient's Chart, lab work & pertinent test results  Airway Mallampati: II       Dental  (+) Teeth Intact   Pulmonary shortness of breath and with exertion, asthma ,    breath sounds clear to auscultation- rhonchi       Cardiovascular hypertension, Pt. on home beta blockers  Rhythm:Regular Rate:Normal     Neuro/Psych    GI/Hepatic negative GI ROS, Neg liver ROS,   Endo/Other  negative endocrine ROS  Renal/GU negative Renal ROS     Musculoskeletal   Abdominal Normal abdominal exam  (+)   Peds  Hematology negative hematology ROS (+)   Anesthesia Other Findings   Reproductive/Obstetrics                             Anesthesia Physical Anesthesia Plan  ASA: II  Anesthesia Plan: General   Post-op Pain Management:    Induction: Intravenous  Airway Management Planned: Oral ETT  Additional Equipment:   Intra-op Plan:   Post-operative Plan: Extubation in OR  Informed Consent: I have reviewed the patients History and Physical, chart, labs and discussed the procedure including the risks, benefits and alternatives for the proposed anesthesia with the patient or authorized representative who has indicated his/her understanding and acceptance.     Plan Discussed with: CRNA  Anesthesia Plan Comments:         Anesthesia Quick Evaluation

## 2016-01-27 NOTE — Op Note (Signed)
[facility] Patient Name: Abel PrestoRenee Fouty Procedure Date: 01/27/2016 12:32 PM MRN: [id number] Account #: [id number] Date of Birth: 12/11/1970 Admit Type: Outpatient Age: 45 [facility] 02 Gender: Female Note Status: Finalized Attending MD: Stephanie AcreVishal Wojciech Willetts , MD Procedure:         Bronchoscopy Indications:       Left upper lobe mass, Bilateral hilar lymphadenopathy Providers:         Stephanie AcreVishal Ruben Mahler, MD Referring MD:       Medicines:         See the Anesthesia note for documentation of the                     administered medications Complications:     No immediate complications Procedure:         Pre-Anesthesia Assessment:                    - A History and Physical has been performed. Patient meds                     and allergies have been reviewed. The risks and benefits                     of the procedure and the sedation options and risks were                     discussed with the patient. All questions were answered                     and informed consent was obtained. Patient identification                     and proposed procedure were verified prior to the                     procedure. Mental Status Examination: normal. After                     reviewing the risks and benefits, the patient was deemed                     in satisfactory condition to undergo the procedure. The                     anesthesia plan was to use general anesthesia. Immediately                     prior to administration of medications, the patient was                     re-assessed for adequacy to receive sedatives. The heart                     rate, respiratory rate, oxygen saturations, blood                     pressure, adequacy of pulmonary ventilation, and response                     to care were monitored throughout the procedure. The                     physical status of the patient was re-assessed after the  procedure.  After obtaining informed consent, the bronchoscope was                     passed under direct vision. Throughout the procedure, the                     patient's blood pressure, pulse, and oxygen saturations                     were monitored continuously. the Bronchoscope was                     introduced through the mouth, via the endotracheal tube                     (the patient was intubated for the procedure) and advanced                     to the tracheobronchial tree. The procedure was                     accomplished without difficulty. Findings:      The nasopharynx/oropharynx appears normal. The larynx appears normal.       The vocal cords appear normal. The subglottic space is normal. The       trachea is of normal caliber. The carina is sharp. The tracheobronchial       tree of the right lung was examined to at least the first subsegmental       level. Bronchial mucosa and anatomy in the right lung are normal; there       are no endobronchial lesions, and no secretions.      Left Lung Abnormalities: Scant secretions were found in the left       mainstem bronchus, in the anterior segment of the left upper lobe (B3),       in the superior lingular segment of the left upper lobe (B4) and in the       inferior lingular segment of the left upper lobe (B5). They were not       obstructing the airway. Impression:        - Left upper lobe cavitary lesion                    - Bilateral hilar lymphadenopathy                    - The right lung was normal.                    - Scant secretions were found in the left mainstem                     bronchus, in the anterior segment of the left upper lobe                     (B3), in the superior lingular segment of the left upper                     lobe (B4) and in the inferior lingular segment of the left                     upper lobe (B5).                    - No  specimens collected. Recommendation:    - Await BAL, biopsy, culture  and cytology results. Stephanie Acre, MD 01/27/2016 2:18:19 PM This report has been signed electronically. Number of Addenda: 0 Note Initiated On: 01/27/2016 12:32 PM      [facility]

## 2016-01-28 LAB — SURGICAL PATHOLOGY

## 2016-01-28 LAB — CYTOLOGY - NON PAP

## 2016-01-30 LAB — ACID FAST SMEAR (AFB): ACID FAST SMEAR - AFSCU2: NEGATIVE

## 2016-01-30 LAB — ACID FAST CULTURE WITH REFLEXED SENSITIVITIES (MYCOBACTERIA)

## 2016-02-04 LAB — CULTURE, BAL-QUANTITATIVE

## 2016-02-04 LAB — CULTURE, BAL-QUANTITATIVE W GRAM STAIN: Special Requests: NORMAL

## 2016-02-06 ENCOUNTER — Telehealth: Payer: Self-pay | Admitting: Infectious Diseases

## 2016-02-06 NOTE — Telephone Encounter (Signed)
-----   Message from Stephanie AcreVishal Mungal, MD sent at 02/05/2016 11:55 PM EDT ----- Regarding: BAL culture results Theodoro Gristave,  We are following this patient (outpatient) - she has a LUL cavity, and sarcoid. I re-bronched her on 5/22, her BAL cx is now growing out heavy growth pseduomonas. Thoughts on antibiotic choice?  Thanks [employer]ish[employer]

## 2016-02-06 NOTE — Telephone Encounter (Signed)
I would suggest ciproflox 750 bid for 21 days. I prefer a long course of pseudomonas in these cases since it is almost impossible to eliminate and if you only treat short course you seem to develop resistance faster.  Can you send it in or would you like me to? Theodoro Gristave

## 2016-02-10 DIAGNOSIS — J151 Pneumonia due to Pseudomonas: Secondary | ICD-10-CM | POA: Insufficient documentation

## 2016-02-21 LAB — CULTURE, FUNGUS WITHOUT SMEAR: SPECIAL REQUESTS: NORMAL

## 2016-02-26 ENCOUNTER — Encounter: Payer: Self-pay | Admitting: Emergency Medicine

## 2016-02-26 ENCOUNTER — Emergency Department: Payer: Medicaid Other

## 2016-02-26 DIAGNOSIS — Z7951 Long term (current) use of inhaled steroids: Secondary | ICD-10-CM | POA: Insufficient documentation

## 2016-02-26 DIAGNOSIS — J45909 Unspecified asthma, uncomplicated: Secondary | ICD-10-CM | POA: Diagnosis not present

## 2016-02-26 DIAGNOSIS — Z79899 Other long term (current) drug therapy: Secondary | ICD-10-CM | POA: Diagnosis not present

## 2016-02-26 DIAGNOSIS — F329 Major depressive disorder, single episode, unspecified: Secondary | ICD-10-CM | POA: Diagnosis not present

## 2016-02-26 DIAGNOSIS — R51 Headache: Secondary | ICD-10-CM | POA: Insufficient documentation

## 2016-02-26 DIAGNOSIS — Z791 Long term (current) use of non-steroidal anti-inflammatories (NSAID): Secondary | ICD-10-CM | POA: Diagnosis not present

## 2016-02-26 LAB — CBC
HEMATOCRIT: 42.3 % (ref 35.0–47.0)
Hemoglobin: 13.9 g/dL (ref 12.0–16.0)
MCH: 26.1 pg (ref 26.0–34.0)
MCHC: 32.8 g/dL (ref 32.0–36.0)
MCV: 79.4 fL — AB (ref 80.0–100.0)
PLATELETS: 215 10*3/uL (ref 150–440)
RBC: 5.32 MIL/uL — ABNORMAL HIGH (ref 3.80–5.20)
RDW: 14.3 % (ref 11.5–14.5)
WBC: 4.4 10*3/uL (ref 3.6–11.0)

## 2016-02-26 LAB — BASIC METABOLIC PANEL
Anion gap: 7 (ref 5–15)
BUN: 14 mg/dL (ref 6–20)
CALCIUM: 8.9 mg/dL (ref 8.9–10.3)
CO2: 24 mmol/L (ref 22–32)
CREATININE: 0.75 mg/dL (ref 0.44–1.00)
Chloride: 106 mmol/L (ref 101–111)
GFR calc Af Amer: >60 mL/min (ref >60)
GLUCOSE: 100 mg/dL — AB (ref 65–99)
Potassium: 3.7 mmol/L (ref 3.5–5.1)
Sodium: 137 mmol/L (ref 135–145)

## 2016-02-26 NOTE — ED Notes (Signed)
Pt arrived to the ED accompanied by family for complaints of headache x3 weeks and generalized body aches. Pt states that she was diagnosed with sarcoidosis and thinks that the medication is not working. Pt states that she thinks that her symptoms today are being caused by something she ate. Pt is AOx4 in no apparent distress with no neurological deficiencies noticed during triage.

## 2016-02-27 ENCOUNTER — Emergency Department
Admission: EM | Admit: 2016-02-27 | Discharge: 2016-02-27 | Disposition: A | Discharge status: Home / Self Care | Payer: Medicaid Other | Attending: Emergency Medicine | Admitting: Emergency Medicine

## 2016-02-27 DIAGNOSIS — R51 Headache: Secondary | ICD-10-CM

## 2016-02-27 DIAGNOSIS — R519 Headache, unspecified: Secondary | ICD-10-CM

## 2016-02-27 MED ORDER — BUTALBITAL-APAP-CAFFEINE 50-325-40 MG PO TABS: 1.0000 | ORAL_TABLET | Freq: Four times a day (QID) | ORAL | Status: AC | PRN

## 2016-02-27 MED ORDER — SODIUM CHLORIDE 0.9 % IV BOLUS (SEPSIS)
1000.0000 mL | Freq: Once | INTRAVENOUS | Status: AC
  Administered 2016-02-27: 1000 mL via INTRAVENOUS

## 2016-02-27 MED ORDER — DIPHENHYDRAMINE HCL 50 MG/ML IJ SOLN
25.0000 mg | Freq: Once | INTRAMUSCULAR | Status: AC
  Administered 2016-02-27: 25 mg via INTRAVENOUS
  Filled 2016-02-27: qty 1

## 2016-02-27 MED ORDER — METOCLOPRAMIDE HCL 5 MG/ML IJ SOLN
10.0000 mg | Freq: Once | INTRAMUSCULAR | Status: AC
  Administered 2016-02-27: 10 mg via INTRAVENOUS
  Filled 2016-02-27: qty 2

## 2016-02-27 MED ORDER — KETOROLAC TROMETHAMINE 30 MG/ML IJ SOLN
30.0000 mg | Freq: Once | INTRAMUSCULAR | Status: AC
  Administered 2016-02-27: 30 mg via INTRAVENOUS
  Filled 2016-02-27: qty 1

## 2016-02-27 NOTE — ED Provider Notes (Signed)
[facility] Emergency Department Provider Note   ____________________________________________  Time seen: Approximately 0030 AM  I have reviewed the triage vital signs and the nursing notes.   HISTORY  Chief Complaint Headache and Generalized Body Aches    HPI Jennifer [name] is a 45 y.o. female who comes into the hospital today with a headache. The patient reports that the headache started yesterday. She reports that she had some body aches and then her head started throbbing. The patient reports that she thinks that might been something she ate. She reports that on Monday she had a ham biscuit and the pulp pork. She reports she did the same thing on Tuesday. She reports that pork does not typically give her headaches but she has had headaches like this in the past. She reports that she took 2 ibuprofen which calmed her headache down but it did not go away completely. She reports that she gets headaches all of the time. She reports that she's been seeing her primary care physician for these headaches. She denies any nausea or vomiting but has had some light sensitivity as well as sound sensitivity. She endorses as well as some blurred vision and some lightheadedness. The patient reports that she could not tolerate the pain so she decided to come into the hospital to get checked out. The patient reports that her pain as a 10 out of 10 in intensity.   Past Medical History  Diagnosis Date  . Tachycardia   . Sepsis (HCC)   . Pneumonia     bilateral community acquired   . Lymphadenopathy   . Sarcoidosis (HCC)   . Asthma   . Fibromyalgia   . Shortness of breath dyspnea   . Incontinence   . Anxiety   . Depression     Patient Active Problem List   Diagnosis Date Noted  . Pulmonary cavitary lesion   . Hilar adenopathy   . Long term current use of systemic steroids 01/03/2015  . Sinus tachycardia (HCC) 01/03/2015  . Cough 11/22/2014  . Dyspnea and  respiratory abnormality 11/22/2014  . Cavitary lesion of lung 11/22/2014    Past Surgical History  Procedure Laterality Date  . Cesarean section      1992/1994  . Lymph node biopsy    . Endobronchial ultrasound N/A 01/27/2016    Procedure: ENDOBRONCHIAL ULTRASOUND;  Surgeon: Stephanie AcreVishal Mungal, MD;  Location: [facility] ORS;  Service: Cardiopulmonary;  Laterality: N/A;  . Video bronchoscopy N/A 01/27/2016    Procedure: VIDEO BRONCHOSCOPY WITH FLUORO;  Surgeon: Stephanie AcreVishal Mungal, MD;  Location: [facility] ORS;  Service: Cardiopulmonary;  Laterality: N/A;    Current Outpatient Rx  Name  Route  Sig  Dispense  Refill  . acetaminophen (TYLENOL) 500 MG tablet   Oral   Take 500 mg by mouth every 6 (six) hours as needed.         Marland Kitchen. ADVAIR DISKUS 250-50 MCG/DOSE AEPB   Inhalation   Inhale 1 puff into the lungs as directed. Patient taking differently: Inhale 1 puff into the lungs 2 (two) times daily.    60 each   2     Dispense as written.   Marland Kitchen. albuterol (ACCUNEB) 1.25 MG/3ML nebulizer solution   Nebulization   Take 1 ampule by nebulization every 6 (six) hours as needed for wheezing.         . benzonatate (TESSALON) 100 MG capsule   Oral   Take 100 mg by mouth every 6 (six) hours as  needed.      0   . butalbital-acetaminophen-caffeine (FIORICET) 50-325-40 MG tablet   Oral   Take 1-2 tablets by mouth every 6 (six) hours as needed for headache.   20 tablet   0   . citalopram (CELEXA) 20 MG tablet   Oral   Take 20 mg by mouth at bedtime.      0   . diclofenac (VOLTAREN) 75 MG EC tablet   Oral   Take 1 tablet (75 mg total) by mouth 2 (two) times daily. Patient taking differently: Take 75 mg by mouth 2 (two) times daily as needed.    30 tablet   0   . fluticasone (FLONASE) 50 MCG/ACT nasal spray   Each Nare   Place 2 sprays into both nostrils daily.      0   . ibuprofen (ADVIL,MOTRIN) 400 MG tablet   Oral   Take 400 mg by mouth every 6 (six) hours as needed.         . metoprolol  tartrate (LOPRESSOR) 50 MG tablet   Oral   Take 1 tablet (50 mg total) by mouth 2 (two) times daily. Patient not taking: Reported on 01/27/2016   30 tablet   6   . omeprazole (PRILOSEC) 40 MG capsule   Oral   Take 1 capsule (40 mg total) by mouth daily.   30 capsule   3   . ondansetron (ZOFRAN) 4 MG tablet   Oral   Take 4 mg by mouth every 6 (six) hours as needed.      0   . polyethylene glycol powder (GLYCOLAX/MIRALAX) powder   Oral   Take 17 g by mouth as needed.      0   . predniSONE (DELTASONE) 5 MG tablet   Oral   Take 2 tablets (10 mg total) by mouth daily with breakfast.   60 tablet   2   . PROAIR HFA 108 (90 BASE) MCG/ACT inhaler   Inhalation   Inhale 108 mcg into the lungs every 6 (six) hours as needed.           Dispense as written.   . pseudoephedrine (SUDAFED) 60 MG tablet   Oral   Take 1 tablet (60 mg total) by mouth every 4 (four) hours as needed for congestion.   24 tablet   0     Allergies Penicillin g  Family History  Problem Relation Age of Onset  . Lung cancer Mother     Social History Social History  Substance Use Topics  . Smoking status: Never Smoker   . Smokeless tobacco: Never Used  . Alcohol Use: No    Review of Systems Constitutional: No fever/chills Eyes:Her vision ENT: No sore throat. Cardiovascular: Denies chest pain. Respiratory: Denies shortness of breath. Gastrointestinal: No abdominal pain.  No nausea, no vomiting.  No diarrhea.  No constipation. Genitourinary: Negative for dysuria. Musculoskeletal: Negative for back pain. Skin: Negative for rash. Neurological: Headache and lightheadedness  10-point ROS otherwise negative.  ____________________________________________   PHYSICAL EXAM:  VITAL SIGNS: ED Triage Vitals  Enc Vitals Group     BP 02/26/16 2031 132/76 mmHg     Pulse Rate 02/26/16 2031 78     Resp 02/26/16 2031 18     Temp 02/26/16 2031 98.1 F (36.7 C)     Temp Source 02/26/16 2031 Oral       SpO2 02/26/16 2031 98 %     Weight 02/26/16 2031 136 lb (61.689 kg)  Height 02/26/16 2031 4\' 9"  (1.448 m)     Head Cir --      Peak Flow --      Pain Score 02/26/16 2032 10     Pain Loc --      Pain Edu? --      Excl. in GC? --     Constitutional: Alert and oriented. Well appearing and in Moderate distress. Eyes: Conjunctivae are normal. PERRL. EOMI. Head: Atraumatic. Nose: No congestion/rhinnorhea. Mouth/Throat: Mucous membranes are moist.  Oropharynx non-erythematous. Cardiovascular: Normal rate, regular rhythm. Grossly normal heart sounds.  Good peripheral circulation. Respiratory: Normal respiratory effort.  No retractions. Lungs CTAB. Gastrointestinal: Soft and nontender. No distention. Positive bowel sounds Musculoskeletal: No lower extremity tenderness nor edema.   Neurologic:  Normal speech and language. Cranial nerves II through XII are grossly intact with no focal motor or neuro deficits Skin:  Skin is warm, dry and intact.  Psychiatric: Mood and affect are normal.   ____________________________________________   LABS (all labs ordered are listed, but only abnormal results are displayed)  Labs Reviewed  CBC - Abnormal; Notable for the following:    RBC 5.32 (*)    MCV 79.4 (*)    All other components within normal limits  BASIC METABOLIC PANEL - Abnormal; Notable for the following:    Glucose, Bld 100 (*)    All other components within normal limits   ____________________________________________  EKG  None ____________________________________________  RADIOLOGY  CT head: Normal CT head without contrast ____________________________________________   PROCEDURES  Procedure(s) performed: None  Critical Care performed: No  ____________________________________________   INITIAL IMPRESSION / ASSESSMENT AND PLAN / ED COURSE  Pertinent labs & imaging results that were available during my care of the patient were reviewed by me and considered in  my medical decision making (see chart for details).  This is a 45 year old female who comes into the hospital today with headache. The patient reports that this headache started yesterday. The patient's blood work and CT scan are unremarkable at this time. The patient reports that she gets headaches all the time and this is similar to headaches that she has had in the past. I will treat the patient with some Reglan, Benadryl, Toradol and some normal saline and then she will be reassessed. The patient has no neurologic deficits.  The patient reports her headache is improved after the medication. She'll be discharged home to follow-up with neurology. ____________________________________________   FINAL CLINICAL IMPRESSION(S) / ED DIAGNOSES  Final diagnoses:  Acute nonintractable headache, unspecified headache type      NEW MEDICATIONS STARTED DURING THIS VISIT:  New Prescriptions   BUTALBITAL-ACETAMINOPHEN-CAFFEINE (FIORICET) 50-325-40 MG TABLET    Take 1-2 tablets by mouth every 6 (six) hours as needed for headache.     Note:  This document was prepared using Dragon voice recognition software and may include unintentional dictation errors.    Rebecka ApleyAllison P Tanessa Tidd, MD 02/27/16 [phone]

## 2016-02-27 NOTE — Discharge Instructions (Signed)
General Headache Without Cause °A headache is pain or discomfort felt around the head or neck area. The specific cause of a headache may not be found. There are many causes and types of headaches. A few common ones are: °· Tension headaches. °· Migraine headaches. °· Cluster headaches. °· Chronic daily headaches. °HOME CARE INSTRUCTIONS  °Watch your condition for any changes. Take these steps to help with your condition: °Managing Pain °· Take over-the-counter and prescription medicines only as told by your health care provider. °· Lie down in a dark, quiet room when you have a headache. °· If directed, apply ice to the head and neck area: °· Put ice in a plastic bag. °· Place a towel between your skin and the bag. °· Leave the ice on for 20 minutes, 2-3 times per day. °· Use a heating pad or hot shower to apply heat to the head and neck area as told by your health care provider. °· Keep lights dim if bright lights bother you or make your headaches worse. °Eating and Drinking °· Eat meals on a regular schedule. °· Limit alcohol use. °· Decrease the amount of caffeine you drink, or stop drinking caffeine. °General Instructions °· Keep all follow-up visits as told by your health care provider. This is important. °· Keep a headache journal to help find out what may trigger your headaches. For example, write down: °· What you eat and drink. °· How much sleep you get. °· Any change to your diet or medicines. °· Try massage or other relaxation techniques. °· Limit stress. °· Sit up straight, and do not tense your muscles. °· Do not use tobacco products, including cigarettes, chewing tobacco, or e-cigarettes. If you need help quitting, ask your health care provider. °· Exercise regularly as told by your health care provider. °· Sleep on a regular schedule. Get 7-9 hours of sleep, or the amount recommended by your health care provider. °SEEK MEDICAL CARE IF:  °· Your symptoms are not helped by medicine. °· You have a  headache that is different from the usual headache. °· You have nausea or you vomit. °· You have a fever. °SEEK IMMEDIATE MEDICAL CARE IF:  °· Your headache becomes severe. °· You have repeated vomiting. °· You have a stiff neck. °· You have a loss of vision. °· You have problems with speech. °· You have pain in the eye or ear. °· You have muscular weakness or loss of muscle control. °· You lose your balance or have trouble walking. °· You feel faint or pass out. °· You have confusion. °  °This information is not intended to replace advice given to you by your health care provider. Make sure you discuss any questions you have with your health care provider. °  °Document Released: 08/24/2005 Document Revised: 05/15/2015 Document Reviewed: 12/17/2014 °Elsevier Interactive Patient Education ©2016 [employer] ° ° °Migraine Headache °A migraine headache is an intense, throbbing pain on one or both sides of your head. A migraine can last for 30 minutes to several hours. °CAUSES  °The exact cause of a migraine headache is not always known. However, a migraine may be caused when nerves in the brain become irritated and release chemicals that cause inflammation. This causes pain. °Certain things may also trigger migraines, such as: °· Alcohol. °· Smoking. °· Stress. °· Menstruation. °· Aged cheeses. °· Foods or drinks that contain nitrates, glutamate, aspartame, or tyramine. °· Lack of sleep. °· Chocolate. °· Caffeine. °· Hunger. °· Physical exertion. °·   Fatigue.  Medicines used to treat chest pain (nitroglycerine), birth control pills, estrogen, and some blood pressure medicines. SIGNS AND SYMPTOMS  Pain on one or both sides of your head.  Pulsating or throbbing pain.  Severe pain that prevents daily activities.  Pain that is aggravated by any physical activity.  Nausea, vomiting, or both.  Dizziness.  Pain with exposure to bright lights, loud noises, or activity.  General sensitivity to bright lights,  loud noises, or smells. Before you get a migraine, you may get warning signs that a migraine is coming (aura). An aura may include:  Seeing flashing lights.  Seeing bright spots, halos, or zigzag lines.  Having tunnel vision or blurred vision.  Having feelings of numbness or tingling.  Having trouble talking.  Having muscle weakness. DIAGNOSIS  A migraine headache is often diagnosed based on:  Symptoms.  Physical exam.  A CT scan or MRI of your head. These imaging tests cannot diagnose migraines, but they can help rule out other causes of headaches. TREATMENT Medicines may be given for pain and nausea. Medicines can also be given to help prevent recurrent migraines.  HOME CARE INSTRUCTIONS  Only take over-the-counter or prescription medicines for pain or discomfort as directed by your health care provider. The use of long-term narcotics is not recommended.  Lie down in a dark, quiet room when you have a migraine.  Keep a journal to find out what may trigger your migraine headaches. For example, write down:  What you eat and drink.  How much sleep you get.  Any change to your diet or medicines.  Limit alcohol consumption.  Quit smoking if you smoke.  Get 7-9 hours of sleep, or as recommended by your health care provider.  Limit stress.  Keep lights dim if bright lights bother you and make your migraines worse. SEEK IMMEDIATE MEDICAL CARE IF:   Your migraine becomes severe.  You have a fever.  You have a stiff neck.  You have vision loss.  You have muscular weakness or loss of muscle control.  You start losing your balance or have trouble walking.  You feel faint or pass out.  You have severe symptoms that are different from your first symptoms. MAKE SURE YOU:   Understand these instructions.  Will watch your condition.  Will get help right away if you are not doing well or get worse.   This information is not intended to replace advice given to  you by your health care provider. Make sure you discuss any questions you have with your health care provider.   Document Released: 08/24/2005 Document Revised: 09/14/2014 Document Reviewed: 05/01/2013 Elsevier Interactive Patient Education [employer]2016 [employer]

## 2016-03-18 ENCOUNTER — Ambulatory Visit: Admission: RE | Admit: 2016-03-18 | Payer: Medicaid Other | Source: Ambulatory Visit

## 2016-03-20 ENCOUNTER — Ambulatory Visit
Admission: RE | Admit: 2016-03-20 | Discharge: 2016-03-20 | Disposition: A | Discharge status: Home / Self Care | Payer: Medicaid Other | Source: Ambulatory Visit | Attending: Internal Medicine | Admitting: Internal Medicine

## 2016-03-20 DIAGNOSIS — R918 Other nonspecific abnormal finding of lung field: Secondary | ICD-10-CM | POA: Diagnosis not present

## 2016-03-20 DIAGNOSIS — J984 Other disorders of lung: Secondary | ICD-10-CM | POA: Insufficient documentation

## 2016-03-23 ENCOUNTER — Ambulatory Visit (INDEPENDENT_AMBULATORY_CARE_PROVIDER_SITE_OTHER): Payer: Medicaid Other | Admitting: Internal Medicine

## 2016-03-23 ENCOUNTER — Encounter: Payer: Self-pay | Admitting: Internal Medicine

## 2016-03-23 VITALS — BP 124/72 | HR 104 | Ht <= 58 in | Wt 137.0 lb

## 2016-03-23 DIAGNOSIS — J984 Other disorders of lung: Secondary | ICD-10-CM

## 2016-03-23 DIAGNOSIS — D860 Sarcoidosis of lung: Secondary | ICD-10-CM

## 2016-03-23 DIAGNOSIS — D869 Sarcoidosis, unspecified: Secondary | ICD-10-CM | POA: Insufficient documentation

## 2016-03-23 MED ORDER — PREDNISONE 5 MG PO TABS: 10.0000 mg | ORAL_TABLET | Freq: Every day | ORAL | Status: DC

## 2016-03-23 NOTE — Progress Notes (Signed)
[facility]      MRN# [id number] Jennifer PulseRenee B [name] 06/18/1971   CC: Chief Complaint  Patient presents with  . Follow-up    CT results. c/o non prod cough X4d feels it's due to the weather.       Brief History: Synopsis: 45 year old female with left upper lobe cavitary lesion, history of bilateral pneumonia, currently being followed by pulmonary and infectious disease. Cavitary lesion differential at this time includes fungal infection versus sarcoidosis. 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 Today she endorses shortness of breath along with chronic dry cough, again these are both chronic conditions. Since her last visit, she has ER due to cough/sob, received another round of antibiotics (zpak) She is not accompanied by Child psychotherapistsocial worker today Currently using prednisone 10 milligrams daily.Tussionex as needed for cough Medication:   Current Outpatient Rx  . Order #: [id number] Class: Historical Med  . Order #: [id number] Class: Normal  . Order #: [id number] Class: Historical Med  . Order #: [id number] Class: Historical Med  . Order #: [id number] Class: Print  . Order #: [id number] Class: Historical Med  . Order #: [id number] Class: Normal  . Order #: [id number] Class: Historical Med  . Order #: [id number] Class: Historical Med  . Order #: [id number] Class: Normal  . Order #: [id number] Class: Normal  . Order #: [id number] Class: Historical Med  . Order #: [id number] Class: Historical Med  . Order #: [id number] Class: Normal  . Order #: [id number] Class: Historical Med  . Order #: [id number] Class: Print     Review of Systems  Constitutional: Negative for fever, chills and weight loss.  HENT: Negative for ear discharge, ear pain, hearing loss, nosebleeds and  tinnitus.   Eyes: Negative for blurred vision and pain.  Respiratory: Positive for cough and shortness of breath. Negative for sputum production.   Cardiovascular: Negative for chest pain and palpitations.  Gastrointestinal: Negative for heartburn, nausea and vomiting.  Skin: Negative for rash.  Neurological: Negative for dizziness and headaches.  Endo/Heme/Allergies: Negative for environmental allergies. Does not bruise/bleed easily.      Allergies:  Penicillin g  Physical Examination:  VS: BP 124/72 (BP Location: Left Arm, Cuff Size: Normal)   [name] (!) 104   Ht 4\' 9"  (1.448 m)   Wt 137 lb (62.1 kg)   LMP 02/05/2016 (Exact Date) Comment: ncp per pt.  SpO2 98%   BMI 29.65 kg/m    General Appearance: No distress  HEENT: PERRLA, no ptosis, no other lesions noticed Pulmonary:good respiratory effort, no wheezes, no crackles.  Cardiovascular:  Normal S1,S2.  No m/r/g.     Abdomen:Exam: Benign, Soft, non-tender, No masses  Skin:   warm, no rashes, no ecchymosis  Extremities: normal, no cyanosis, clubbing, warm with normal capillary refill.      Rad results: (The following images and results were reviewed by Dr. Dema SeverinMungal on 04/08/2016). CT Chest  03/20/16 CT CHEST WITHOUT CONTRAST  TECHNIQUE: Multidetector CT imaging of the chest was performed following the standard protocol without IV contrast.  COMPARISON: 12/31/2015; 07/04/2015; 11/27/2014; 11/05/2014  FINDINGS: Cardiovascular: Normal heart size. No pericardial effusion.  Mediastinum/Nodes: Prominent precarinal lymph node is unchanged, measuring approximately 0.7 cm in greatest short axis diameter (image 53, series 2). Previous identified bilateral hilar adenopathy is suboptimally evaluated on this noncontrast examination. No definitive axillary adenopathy.  Lungs/Pleura: Re- demonstrated left upper lobe associated  volume loss, bronchiectasis and adjacent cavitations which have minimally increased in size in the  interval with dominant cranial cavity measuring approximately 2.3 x 1.9 cm (coronal image 61, series 5), previously, 2.0 x 1.7 cm and the caudal cavity measuring approximately 1.7 x 2.1 cm (coronal image 59, series 5), previously 1.8 x 1.7 cm. 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  Suspected minimal improvement in previously noted extensive bilateral upper lobe predominant perilymphatic distributed macronodularity (representative image 37, series 3, compared to image 43, series 3 on the 12/2015 examination), compatible with history of pulmonary sarcoidosis. No new focal airspace opacities. No pleural effusion or pneumothorax.  Upper Abdomen: Noncontrast evaluation of the upper abdomen is unremarkable  Musculoskeletal: No acute or aggressive osseous abnormalities.  Regional soft tissues appear normal. Normal noncontrast appearance of the thyroid gland.  IMPRESSION: 1. 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 2. No new focal airspace opacities.     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 Patient  has completed prolonged course of voriconazole already.  Patient with good improvement in her respiratory status, however now with persistent cough.  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  Patient with possible progression of sarcoid (Fibrocavitary disease and diffuse opacities).  She is fairly stable and prednisone 10 mg.  I have discussed the case with CTS, and their recommendation is close surveillance and continued medical management. Patient has been re-bronched, currently with no positive cultures for bacterial or fungal or AFB. 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 At this time also I will refer this patient to [facility] pulmonary division, Dr. Farrel Gobble for a second opinion as per patient request.  Plan: - Continue with prednisone 10mg , if cough starts to get worst may need to go back to last effective dose (15-20mg ) and wean slowly down.  - cont with Advair.  - Referral to [facility] pulmonary division, Dr. 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 Continue prednisone Will discuss case with infectious disease for reinitiating voriconazole. Referral to [facility] pulmonary for secondary opinion  Pulmonary cavitary lesion The plan for pulmonary sarcoid and cavitary lesion of the lung   Updated Medication List Outpatient Encounter Prescriptions as of 03/23/2016  Medication Sig  . acetaminophen (TYLENOL) 500 MG tablet Take 500 mg by mouth every 6 (six) hours as needed.  Marland Kitchen ADVAIR DISKUS 250-50 MCG/DOSE AEPB Inhale 1 puff into the lungs as directed. (Patient taking differently: Inhale 1 puff into the lungs 2 (two) times daily. )  . albuterol (ACCUNEB) 1.25 MG/3ML nebulizer solution Take  1 ampule by nebulization every 6 (six)  hours as needed for wheezing.  . benzonatate (TESSALON) 100 MG capsule Take 100 mg by mouth every 6 (six) hours as needed.  . butalbital-acetaminophen-caffeine (FIORICET) 50-325-40 MG tablet Take 1-2 tablets by mouth every 6 (six) hours as needed for headache.  . citalopram (CELEXA) 20 MG tablet Take 20 mg by mouth at bedtime.  . diclofenac (VOLTAREN) 75 MG EC tablet Take 1 tablet (75 mg total) by mouth 2 (two) times daily. (Patient taking differently: Take 75 mg by mouth 2 (two) times daily as needed. )  . fluticasone (FLONASE) 50 MCG/ACT nasal spray Place 2 sprays into both nostrils daily.  Marland Kitchen ibuprofen (ADVIL,MOTRIN) 400 MG tablet Take 400 mg by mouth every 6 (six) hours as needed.  . metoprolol tartrate (LOPRESSOR) 50 MG tablet Take 1 tablet (50 mg total) by mouth 2 (two) times daily.  Marland Kitchen omeprazole (PRILOSEC) 40 MG capsule Take 1 capsule (40 mg total) by mouth daily.  . ondansetron (ZOFRAN) 4 MG tablet Take 4 mg by mouth every 6 (six) hours as needed.  . polyethylene glycol powder (GLYCOLAX/MIRALAX) powder Take 17 g by mouth as needed.  . predniSONE (DELTASONE) 5 MG tablet Take 2 tablets (10 mg total) by mouth daily with breakfast.  . PROAIR HFA 108 (90 BASE) MCG/ACT inhaler Inhale 108 mcg into the lungs every 6 (six) hours as needed.  . pseudoephedrine (SUDAFED) 60 MG tablet Take 1 tablet (60 mg total) by mouth every 4 (four) hours as needed for congestion.  . [DISCONTINUED] predniSONE (DELTASONE) 5 MG tablet Take 2 tablets (10 mg total) by mouth daily with breakfast.  . [DISCONTINUED] ciprofloxacin (CIPRO) 750 MG tablet Reported on 03/23/2016  . [DISCONTINUED] omeprazole (PRILOSEC) 20 MG capsule    No facility-administered encounter medications on file as of 03/23/2016.     Orders for this visit: No orders of the defined types were placed in this encounter.   Thank  you for the visitation and for allowing  [facility] to assist in the care of your patient. Our  recommendations are noted above.  Please contact us if we can be of further service.  Stephanie Acre, MD [facility] Office Number: [phone]

## 2016-03-23 NOTE — Patient Instructions (Addendum)
Follow up with Dr. Dema SeverinMungal in: 3 months - cont with prednisone 10 mg daily - we will discuss your case with CT surgery and Infectious disease, and keep you and your social worker informed of our plans/recommendations - we will keep your social worker updated to appointments and plan of care.

## 2016-03-27 ENCOUNTER — Ambulatory Visit: Payer: Medicaid Other

## 2016-04-08 ENCOUNTER — Other Ambulatory Visit: Payer: Self-pay | Admitting: *Deleted

## 2016-04-08 DIAGNOSIS — D860 Sarcoidosis of lung: Secondary | ICD-10-CM

## 2016-04-08 NOTE — Assessment & Plan Note (Signed)
Left upper lobe cavitary lung lesion with significant hilar (bilateral) and maxillary lymphadenopathy. 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 Patient has completed prolonged course of voriconazole already.  Patient with good improvement in her respiratory status, however now with persistent cough.  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  Patient with possible progression of sarcoid (Fibrocavitary disease and diffuse opacities).  She is fairly stable and prednisone 10 mg.  I have discussed the case with CTS, and their recommendation is close surveillance and continued medical management. Patient has been re-bronched, currently with no positive cultures for bacterial or fungal or AFB. 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 At this time also I will refer this patient to [facility] pulmonary division, Dr. Farrel Gobble for a second opinion as per patient request.  Plan: - Continue with prednisone 10mg , if cough starts to get worst may need to go back to last effective dose (15-20mg ) and wean slowly down.  - cont with Advair.  - Referral to [facility] pulmonary division, Dr. Farrel Gobble

## 2016-04-08 NOTE — Assessment & Plan Note (Signed)
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 Continue prednisone Will discuss case with infectious disease for reinitiating voriconazole. Referral to [facility] pulmonary for secondary opinion

## 2016-04-08 NOTE — Assessment & Plan Note (Signed)
The plan for pulmonary sarcoid and cavitary lesion of the lung

## 2016-06-05 ENCOUNTER — Telehealth: Payer: Self-pay | Admitting: Internal Medicine

## 2016-06-05 ENCOUNTER — Telehealth: Payer: Self-pay | Admitting: *Deleted

## 2016-06-05 NOTE — Telephone Encounter (Signed)
Received OV notes. Pt has seen Dr. Mariah MillingGollan last 12/2014. OV notes in your folder to review. Please advise if you want to see pt back or get her in with cardiology. Thanks.

## 2016-06-05 NOTE — Telephone Encounter (Signed)
Jennifer [name] from Dr. Sallee LangeSoles office called stating provider wanted to inform pulmonary that when pt was seen on 06/03/16 that on physical exam pt had heart dysrhythmia with a 3 to 1 ratio av block, Mobitz Type 1. States in OV note that EKG will be done on return OV. Asked nurse to fax me office note. Will await OV note and will inform provider on call.

## 2016-06-05 NOTE — Telephone Encounter (Signed)
Patient pcp office calling to discuss mobitz 3:1 avb and pulm dx.  [facility]

## 2016-06-08 ENCOUNTER — Ambulatory Visit: Payer: Medicaid Other | Admitting: Internal Medicine

## 2016-06-08 NOTE — Telephone Encounter (Signed)
I do not see EKG number system?

## 2016-06-08 NOTE — Telephone Encounter (Signed)
She didn't have an EKG.   I was told that the nurse "heard a heart block". We're setting her up to see you an get an EKG.

## 2016-06-08 NOTE — Telephone Encounter (Signed)
Dr. Dema SeverinMungal ask me to forward this to you in regards to the message below per phone call I received from PCP. Pt seen you 01/03/15.

## 2016-06-08 NOTE — Telephone Encounter (Signed)
L MOM to schedule an appt with Dr. Mariah MillingGollan

## 2016-06-08 NOTE — Telephone Encounter (Signed)
Please forward to her cardiologist.   Thank you

## 2016-06-08 NOTE — Telephone Encounter (Signed)
Can we set her up an appt to see Dr. Mariah MillingGollan or one of the PAs?

## 2016-06-09 ENCOUNTER — Ambulatory Visit (INDEPENDENT_AMBULATORY_CARE_PROVIDER_SITE_OTHER): Payer: Medicaid Other | Admitting: Cardiology

## 2016-06-09 ENCOUNTER — Encounter (INDEPENDENT_AMBULATORY_CARE_PROVIDER_SITE_OTHER): Payer: Self-pay

## 2016-06-09 ENCOUNTER — Encounter: Payer: Self-pay | Admitting: Cardiology

## 2016-06-09 VITALS — BP 118/70 | HR 91 | Ht <= 58 in | Wt 134.5 lb

## 2016-06-09 DIAGNOSIS — R002 Palpitations: Secondary | ICD-10-CM

## 2016-06-09 DIAGNOSIS — I493 Ventricular premature depolarization: Secondary | ICD-10-CM | POA: Diagnosis not present

## 2016-06-09 NOTE — Patient Instructions (Addendum)
Testing/Procedures: Your physician has recommended that you wear a holter monitor. Holter monitors are medical devices that record the heart's electrical activity. Doctors most often use these monitors to diagnose arrhythmias. Arrhythmias are problems with the speed or rhythm of the heartbeat. The monitor is a small, portable device. You can wear one while you do your normal daily activities. This is usually used to diagnose what is causing palpitations/syncope (passing out).  Your physician has requested that you have an echocardiogram. Echocardiography is a painless test that uses sound waves to create images of your heart. It provides your doctor with information about the size and shape of your heart and how well your heart's chambers and valves are working. This procedure takes approximately one hour. There are no restrictions for this procedure.    Follow-Up: Your physician recommends that you schedule a follow-up appointment in: 3 months with Dr. Alvino ChapelIngal.  It was a pleasure seeing you today here in the office. Please do not hesitate to give us a call back if you have any further questions. [phone]  N[place]ck CellarPamela A. RN, BSN     Holter Monitoring A Holter monitor is a small device that is used to detect abnormal heart rhythms. It clips to your clothing and is connected by wires to flat, sticky disks (electrodes) that attach to your chest. It is worn continuously for 24-48 hours. HOME CARE INSTRUCTIONS  Wear your Holter monitor at all times, even while exercising and sleeping, for as long as directed by your health care provider.  Make sure that the Holter monitor is safely clipped to your clothing or close to your body as recommended by your health care provider.  Do not get the monitor or wires wet.  Do not put body lotion or moisturizer on your chest.  Keep your skin clean.  Keep a diary of your daily activities, such as walking and doing chores. If you feel that your heartbeat is  abnormal or that your heart is fluttering or skipping a beat:  Record what you are doing when it happens.  Record what time of day the symptoms occur.  Return your Holter monitor as directed by your health care provider.  Keep all follow-up visits as directed by your health care provider. This is important. SEEK IMMEDIATE MEDICAL CARE IF:  You feel lightheaded or you faint.  You have trouble breathing.  You feel pain in your chest, upper arm, or jaw.  You feel sick to your stomach and your skin is pale, cool, or damp.  You heartbeat feels unusual or abnormal.   This information is not intended to replace advice given to you by your health care provider. Make sure you discuss any questions you have with your health care provider.   Document Released: 05/22/2004 Document Revised: 09/14/2014 Document Reviewed: 04/02/2014 Elsevier Interactive Patient Education 2016 [employer]     Echocardiogram An echocardiogram, or echocardiography, uses sound waves (ultrasound) to produce an image of your heart. The echocardiogram is simple, painless, obtained within a short period of time, and offers valuable information to your health care provider. The images from an echocardiogram can provide information such as:  Evidence of coronary artery disease (CAD).  Heart size.  Heart muscle function.  Heart valve function.  Aneurysm detection.  Evidence of a past heart attack.  Fluid buildup around the heart.  Heart muscle thickening.  Assess heart valve function. LET [facility]OUR [facility] CARE PROVIDER KNOW ABOUT:  Any allergies you have.  All medicines you are taking,  including vitamins, herbs, eye drops, creams, and over-the-counter medicines.  Previous problems you or members of your family have had with the use of anesthetics.  Any blood disorders you have.  Previous surgeries you have had.  Medical conditions you have.  Possibility of pregnancy, if this applies. BEFORE THE  PROCEDURE  No special preparation is needed. Eat and drink normally.  PROCEDURE   In order to produce an image of your heart, gel will be applied to your chest and a wand-like tool (transducer) will be moved over your chest. The gel will help transmit the sound waves from the transducer. The sound waves will harmlessly bounce off your heart to allow the heart images to be captured in real-time motion. These images will then be recorded.  You may need an IV to receive a medicine that improves the quality of the pictures. AFTER THE PROCEDURE You may return to your normal schedule including diet, activities, and medicines, unless your health care provider tells you otherwise.   This information is not intended to replace advice given to you by your health care provider. Make sure you discuss any questions you have with your health care provider.   Document Released: 08/21/2000 Document Revised: 09/14/2014 Document Reviewed: 05/01/2013 Elsevier Interactive Patient Education [employer].

## 2016-06-09 NOTE — Progress Notes (Addendum)
Cardiology Office Note   Date:  06/09/2016   ID:  Jennifer [name], DOB 1971-05-25, MRN [id number]  Referring Doctor:  [facility]   Cardiologist:   Almond Lint, MD   Reason for consultation:  Chief Complaint  Patient presents with  . other    Ref by Dr. Sallee Lange for irregular heartbeat. Meds reviewed by the pt. verbally.       History of Present Illness: Jennifer [name] is a 45 y.o. female who presents for Noted to have irregular heartbeat on physical examination.  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  Patient reports occasional palpitations. She is continuing to take metoprolol as prescribed before. Denies chest pain. She has chronic shortness of breath which is related to her sarcoidosis in current infection.  No loss of consciousness. No PND, orthopnea.  ROS:  Please see the history of present illness. Aside from mentioned under HPI, all other systems are reviewed and negative.     Past Medical History:  Diagnosis Date  . Anxiety   . Asthma   . Depression   . Fibromyalgia   . Incontinence   . Lymphadenopathy   . Pneumonia    bilateral community acquired   . Sarcoidosis (HCC)   . Sepsis (HCC)   . Shortness of breath dyspnea   . Tachycardia     Past Surgical History:  Procedure Laterality Date  . CESAREAN SECTION     1992/1994  . ENDOBRONCHIAL ULTRASOUND N/A 01/27/2016   Procedure: ENDOBRONCHIAL ULTRASOUND;  Surgeon: Stephanie Acre, MD;  Location: [facility] ORS;  Service: Cardiopulmonary;  Laterality: N/A;  . LYMPH NODE BIOPSY    . VIDEO BRONCHOSCOPY N/A 01/27/2016   Procedure: VIDEO BRONCHOSCOPY WITH FLUORO;  Surgeon: Stephanie Acre, MD;  Location: [facility] ORS;  Service: Cardiopulmonary;  Laterality: N/A;     reports that she has never smoked. She has never used smokeless tobacco. She reports that she does not drink alcohol or use drugs.   family history includes Lung cancer in  her mother.   Outpatient Medications Prior to Visit  Medication Sig Dispense Refill  . acetaminophen (TYLENOL) 500 MG tablet Take 500 mg by mouth every 6 (six) hours as needed.    Marland Kitchen ADVAIR DISKUS 250-50 MCG/DOSE AEPB Inhale 1 puff into the lungs as directed. (Patient taking differently: Inhale 1 puff into the lungs 2 (two) times daily. ) 60 each 2  . albuterol (ACCUNEB) 1.25 MG/3ML nebulizer solution Take 1 ampule by nebulization every 6 (six) hours as needed for wheezing.    . benzonatate (TESSALON) 100 MG capsule Take 100 mg by mouth every 6 (six) hours as needed.  0  . butalbital-acetaminophen-caffeine (FIORICET) 50-325-40 MG tablet Take 1-2 tablets by mouth every 6 (six) hours as needed for headache. 20 tablet 0  . citalopram (CELEXA) 20 MG tablet Take 20 mg by mouth at bedtime.  0  . diclofenac (VOLTAREN) 75 MG EC tablet Take 1 tablet (75 mg total) by mouth 2 (two) times daily. (Patient taking differently: Take 75 mg by mouth 2 (two) times daily as needed. ) 30 tablet 0  . fluticasone (FLONASE) 50 MCG/ACT nasal spray Place 2 sprays into both nostrils daily.  0  . ibuprofen (ADVIL,MOTRIN) 400 MG tablet Take 400 mg by mouth every 6 (six) hours as needed.    . metoprolol tartrate (LOPRESSOR) 50 MG tablet Take 1 tablet (50 mg total) by mouth 2 (two) times daily. 30 tablet  6  . omeprazole (PRILOSEC) 40 MG capsule Take 1 capsule (40 mg total) by mouth daily. 30 capsule 3  . ondansetron (ZOFRAN) 4 MG tablet Take 4 mg by mouth every 6 (six) hours as needed.  0  . polyethylene glycol powder (GLYCOLAX/MIRALAX) powder Take 17 g by mouth as needed.  0  . predniSONE (DELTASONE) 5 MG tablet Take 2 tablets (10 mg total) by mouth daily with breakfast. 60 tablet 2  . PROAIR HFA 108 (90 BASE) MCG/ACT inhaler Inhale 108 mcg into the lungs every 6 (six) hours as needed.    . pseudoephedrine (SUDAFED) 60 MG tablet Take 1 tablet (60 mg total) by mouth every 4 (four) hours as needed for congestion. 24 tablet 0    No facility-administered medications prior to visit.      Allergies: Penicillin g    PHYSICAL EXAM: VS:  BP 118/70 (BP Location: Left Arm, Patient Position: Sitting, Cuff Size: Normal)   Pulse 91   Ht 4\' 9"  (1.448 m)   Wt 134 lb 8 oz (61 kg)   BMI 29.11 kg/m  , Body mass index is 29.11 kg/m. Wt Readings from Last 3 Encounters:  06/09/16 134 lb 8 oz (61 kg)  03/23/16 137 lb (62.1 kg)  02/26/16 136 lb (61.7 kg)    GENERAL:  well developed, well nourished,, not in acute distress HEENT: normocephalic, pink conjunctivae, anicteric sclerae, no xanthelasma, normal dentition, oropharynx clear NECK:  no neck vein engorgement, JVP normal, no hepatojugular reflux, carotid upstroke brisk and symmetric, no bruit, no thyromegaly, no lymphadenopathy LUNGS:  good respiratory effort, clear to auscultation bilaterally CV:  PMI not displaced, no thrills, no lifts, S1 and S2 within normal limits, no palpable S3 or S4, no murmurs, no rubs, no gallops ABD:  Soft, nontender, nondistended, normoactive bowel sounds, no abdominal aortic bruit, no hepatomegaly, no splenomegaly MS: nontender back, no kyphosis, no scoliosis, no joint deformities EXT:  2+ DP/PT pulses, no edema, no varicosities, no cyanosis, no clubbing SKIN: warm, nondiaphoretic, normal turgor, no ulcers NEUROPSYCH: alert, oriented to person, place, and time, sensory/motor grossly intact, normal mood, appropriate affect  Recent Labs: 02/26/2016: BUN 14; Creatinine, Ser 0.75; Hemoglobin 13.9; Platelets 215; Potassium 3.7; Sodium 137   Lipid Panel No results found for: CHOL, TRIG, HDL, CHOLHDL, VLDL, LDLCALC, LDLDIRECT   Other studies Reviewed:  EKG:  The ekg from 06/09/2016 was personally reviewed by me and it revealed sinus rhythm, occasional PVCs. 91 BPM.  Additional studies/ records that were reviewed personally reviewed by me today include: None available   ASSESSMENT AND PLAN:  Palpitations PVCs noted on EKG Per PCP note,  irregular heartbeat on physical examination, found to have AV block but not documented on an EKG Recommend continue medical therapy for now.  Recommend Holter monitor, 24 hours and echocardiogram. At some point, will likely need a cardiac MRI to see if there is cardiac involvement in sarcoidosis. Patient verbalized understanding and agreed with plan.  Current medicines are reviewed at length with the patient today.  The patient does not have concerns regarding medicines.  Labs/ tests ordered today include:  Orders Placed This Encounter  Procedures  . Holter monitor - 24 hour  . EKG 12-Lead  . ECHOCARDIOGRAM COMPLETE    I had a lengthy and detailed discussion with the patient regarding diagnoses, prognosis, diagnostic options, treatment options , and side effects of medications.   I counseled the patient on importance of lifestyle modification including heart healthy diet, regular physical activity.  Disposition:   FU with undersigned after tests   I spent at least 40 minutes with the patient today and more than 50% of the time was spent counseling the patient and coordinating care.     Signed, Almond Lint, MD  06/09/2016 12:55 PM    [facility]  This note was generated in part with voice recognition software and I apologize for any typographical errors that were not detected and corrected.

## 2016-06-29 ENCOUNTER — Ambulatory Visit (INDEPENDENT_AMBULATORY_CARE_PROVIDER_SITE_OTHER): Payer: Medicaid Other | Admitting: Internal Medicine

## 2016-06-29 ENCOUNTER — Encounter: Payer: Self-pay | Admitting: Internal Medicine

## 2016-06-29 ENCOUNTER — Other Ambulatory Visit: Payer: Self-pay

## 2016-06-29 ENCOUNTER — Ambulatory Visit (INDEPENDENT_AMBULATORY_CARE_PROVIDER_SITE_OTHER): Payer: Medicaid Other

## 2016-06-29 ENCOUNTER — Other Ambulatory Visit: Payer: Medicaid Other

## 2016-06-29 ENCOUNTER — Other Ambulatory Visit: Payer: Self-pay | Admitting: *Deleted

## 2016-06-29 VITALS — BP 118/82 | HR 76 | Ht <= 58 in | Wt 134.0 lb

## 2016-06-29 DIAGNOSIS — I493 Ventricular premature depolarization: Secondary | ICD-10-CM | POA: Diagnosis not present

## 2016-06-29 DIAGNOSIS — J984 Other disorders of lung: Secondary | ICD-10-CM | POA: Diagnosis not present

## 2016-06-29 DIAGNOSIS — B449 Aspergillosis, unspecified: Secondary | ICD-10-CM

## 2016-06-29 DIAGNOSIS — R002 Palpitations: Secondary | ICD-10-CM

## 2016-06-29 DIAGNOSIS — D860 Sarcoidosis of lung: Secondary | ICD-10-CM

## 2016-06-29 NOTE — Progress Notes (Signed)
[facility] Pulmonary Medicine Consultation      MRN# [id number] Jennifer [name] 02-01-1971   CC: Chief Complaint  Patient presents with  . Follow-up    SOB some better; prod cough w/clear mucus; CP (f/u w/cardiology tomorrw)      Brief History: Synopsis: 45 year old female with left upper lobe cavitary lesion, history of bilateral pneumonia, currently being followed by pulmonary and infectious disease. Cavitary lesion differential at this time includes fungal infection versus sarcoidosis. 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 At her last visit she was referred for second opinion at [facility] pulmonary, Dr. 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 Since her last visit she continues to have a chronic cough and shortness of breath currently at baseline. She is also had some intermittent chest pains and irregular heartbeats that was noted by her PCP, she currently has a Holter monitor on and will be following up with cardiology. Medication:    Current Outpatient Prescriptions:  .  acetaminophen (TYLENOL) 500 MG tablet, Take 500 mg by mouth every 6 (six) hours as needed., Disp: , Rfl:  .  ADVAIR DISKUS 250-50 MCG/DOSE AEPB, Inhale 1 puff into the lungs as directed. (Patient taking differently: Inhale 1 puff into the lungs 2 (two) times daily. ), Disp: 60 each, Rfl: 2 .  albuterol (ACCUNEB) 1.25 MG/3ML nebulizer solution, Take 1 ampule by nebulization every 6 (six) hours as needed for wheezing., Disp: , Rfl:  .  butalbital-acetaminophen-caffeine (FIORICET) 50-325-40 MG tablet, Take 1-2 tablets by mouth every 6 (six) hours as needed for headache., Disp: 20 tablet, Rfl:  0 .  citalopram (CELEXA) 20 MG tablet, Take 20 mg by mouth at bedtime., Disp: , Rfl: 0 .  diclofenac (VOLTAREN) 75 MG EC tablet, Take 1 tablet (75 mg total) by mouth 2 (two) times daily. (Patient taking differently: Take 75 mg by mouth 2 (two) times daily as needed. ), Disp: 30 tablet, Rfl: 0 .  fluticasone (FLONASE) 50 MCG/ACT nasal spray, Place 2 sprays into both nostrils daily., Disp: , Rfl: 0 .  ibuprofen (ADVIL,MOTRIN) 400 MG tablet, Take 400 mg by mouth every 6 (six) hours as needed., Disp: , Rfl:  .  metoprolol tartrate (LOPRESSOR) 50 MG tablet, Take 1 tablet (50 mg total) by mouth 2 (two) times daily., Disp: 30 tablet, Rfl: 6 .  omeprazole (PRILOSEC) 40 MG capsule, Take 1 capsule (40 mg total) by mouth daily., Disp: 30 capsule, Rfl: 3 .  ondansetron (ZOFRAN) 4 MG tablet, Take 4 mg by mouth every 6 (six) hours as needed., Disp: , Rfl: 0 .  polyethylene glycol powder (GLYCOLAX/MIRALAX) powder, Take 17 g by mouth as needed., Disp: , Rfl: 0 .  predniSONE (DELTASONE) 5 MG tablet, Take 2 tablets (10 mg total) by mouth daily with breakfast., Disp: 60 tablet, Rfl: 2 .  PROAIR HFA 108 (90 BASE) MCG/ACT inhaler, Inhale 108 mcg into the lungs every 6 (six) hours as needed., Disp: , Rfl:  .  pseudoephedrine (SUDAFED) 60 MG tablet, Take 1 tablet (60 mg total) by mouth every 4 (four) hours as needed for congestion., Disp: 24 tablet, Rfl: 0 .  benzonatate (TESSALON) 100 MG capsule, Take 100 mg by mouth every 6 (six) hours as needed., Disp: , Rfl: 0  Review of Systems  Constitutional: Negative for chills, fever and weight loss.  HENT: Negative for ear discharge, ear pain, hearing loss, nosebleeds and tinnitus.   Eyes: Negative for blurred vision and pain.  Respiratory: Positive for cough and shortness of breath. Negative for sputum production.   Cardiovascular: Negative for chest pain and palpitations.  Gastrointestinal: Negative for heartburn, nausea and vomiting.  Skin: Negative for rash.   Neurological: Negative for dizziness and headaches.  Endo/Heme/Allergies: Negative for environmental allergies. Does not bruise/bleed easily.      Allergies:  Penicillin g  Physical Examination:  VS: BP 118/82 (BP Location: Left Arm, Cuff Size: Normal)   Pulse 76   Ht 4\' 9"  (1.448 m)   Wt 134 lb (60.8 kg)   SpO2 98%   BMI 29.00 kg/m   General Appearance: No distress  HEENT: PERRLA, no ptosis, no other lesions noticed Pulmonary:good respiratory effort, no wheezes, no crackles.  Cardiovascular:  Normal S1,S2.  No m/r/g.     Abdomen:Exam: Benign, Soft, non-tender, No masses  Skin:   warm, no rashes, no ecchymosis  Extremities: normal, no cyanosis, clubbing, warm with normal capillary refill.      Rad results: (The following images and results were reviewed by Dr. Dema Severin on 06/29/2016). CT Chest  03/20/16 CT CHEST WITHOUT CONTRAST  TECHNIQUE: Multidetector CT imaging of the chest was performed following the standard protocol without IV contrast.  COMPARISON: 12/31/2015; 07/04/2015; 11/27/2014; 11/05/2014  FINDINGS: Cardiovascular: Normal heart size. No pericardial effusion.  Mediastinum/Nodes: Prominent precarinal lymph node is unchanged, measuring approximately 0.7 cm in greatest short axis diameter (image 53, series 2). Previous identified bilateral hilar adenopathy is suboptimally evaluated on this noncontrast examination. No definitive axillary adenopathy.  Lungs/Pleura: Re- demonstrated left upper lobe associated volume loss, bronchiectasis and adjacent cavitations which have minimally increased in size in the interval with dominant cranial cavity measuring approximately 2.3 x 1.9 cm (coronal image 61, series 5), previously, 2.0 x 1.7 cm and the caudal cavity measuring approximately 1.7 x 2.1 cm (coronal image 59, series 5), previously 1.8 x 1.7 cm. 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  Suspected minimal improvement in previously noted extensive bilateral upper lobe predominant perilymphatic distributed macronodularity (representative image 37, series 3, compared to image 43, series 3 on the 12/2015 examination), compatible with history of pulmonary sarcoidosis. No new focal airspace opacities. No pleural effusion or pneumothorax.  Upper Abdomen: Noncontrast evaluation of the upper abdomen is unremarkable  Musculoskeletal: No acute or aggressive osseous abnormalities.  Regional soft tissues appear normal. Normal noncontrast appearance of the thyroid gland.  IMPRESSION: 1. 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 2. No new focal airspace opacities.     Assessment and Plan: 45 year old female with pulmonary sarcoidosis presenting for follow-up visit Aspergilloma (HCC) Previously had voriconazole treatment early in 2016, but then developed a fungus ball on CT recently. Seen by [facility]monary, advised to cont with Sarcoid tx and restart Voriconazole, may need indefinite tx.  Plan - ID follow up with Dr. Sampson Goon, may restart Vori  Pulmonary sarcoidosis [facility]) Left upper lobe cavitary lung lesion with significant hilar (bilateral) and maxillary lymphadenopathy. Differential diagnosis includes: Sarcoidosis, atypical lung infection, fungal infection (Aspergillus, blastomycosis, histoplasmosis,), lymphoma, vasculitis  Patient with complex  case of cavitary lung lesion, however given her clinical symptoms along with current results the working diagnosis at this time is cavitary lung lesion secondary to fungal infection with possible sarcoidosis. Patient has completed prolonged course of voriconazole already.    Patient with good improvement in her respiratory status, however now with persistent cough.  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  Patient with possible progression of sarcoid (Fibrocavitary disease and diffuse opacities).  She is fairly stable and prednisone 10 mg.  I have discussed the case with CTS, and their recommendation is close surveillance and continued medical management. Patient has been re-bronched, currently with no positive cultures for bacterial or fungal or AFB. 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 Seen by Dr. Donnie Aho at [facility] (04/2016) -recs - in [place] with current psychoactive treatment, restarted voriconazole, if no improvement may need to consider surgical options  Plan: - Continue with prednisone 10mg , if cough starts to get worst may need to go back to last effective dose (15-20mg ) and wean slowly down.  - cont with Advair.  - Referral to infectious disease, reevaluate for voriconazole treatment           Pulmonary cavitary lesion The plan for pulmonary sarcoid and cavitary lesion of the lung   Updated Medication List Outpatient Encounter Prescriptions as of 06/29/2016  Medication Sig  . acetaminophen (TYLENOL) 500 MG tablet Take 500 mg by mouth every 6 (six) hours as needed.  Marland Kitchen ADVAIR DISKUS 250-50 MCG/DOSE AEPB Inhale 1 puff into the lungs as directed. (Patient taking differently: Inhale 1 puff into the lungs 2 (two) times daily. )  . albuterol (ACCUNEB) 1.25 MG/3ML nebulizer solution Take 1 ampule by nebulization every 6 (six) hours as needed for wheezing.  . butalbital-acetaminophen-caffeine (FIORICET) 50-325-40 MG tablet Take 1-2 tablets by mouth every 6 (six) hours as needed for headache.  . citalopram (CELEXA) 20 MG tablet Take 20 mg by mouth at bedtime.  .  diclofenac (VOLTAREN) 75 MG EC tablet Take 1 tablet (75 mg total) by mouth 2 (two) times daily. (Patient taking differently: Take 75 mg by mouth 2 (two) times daily as needed. )  . fluticasone (FLONASE) 50 MCG/ACT nasal spray Place 2 sprays into both nostrils daily.  Marland Kitchen ibuprofen (ADVIL,MOTRIN) 400 MG tablet Take 400 mg by mouth every 6 (six) hours as needed.  . metoprolol tartrate (LOPRESSOR) 50 MG tablet Take 1 tablet (50 mg total) by mouth 2 (two) times daily.  Marland Kitchen omeprazole (PRILOSEC) 40 MG capsule Take 1 capsule (40 mg total) by mouth daily.  . ondansetron (ZOFRAN) 4 MG tablet Take 4 mg by mouth every 6 (six) hours as needed.  . polyethylene glycol powder (GLYCOLAX/MIRALAX) powder Take 17 g by mouth as needed.  . predniSONE (DELTASONE) 5 MG tablet Take 2 tablets (10 mg total) by mouth daily with breakfast.  . PROAIR HFA 108 (90 BASE) MCG/ACT inhaler Inhale 108 mcg into the lungs every 6 (six) hours as needed.  . pseudoephedrine (SUDAFED) 60 MG tablet Take 1 tablet (60 mg total) by mouth every 4 (four) hours as needed for congestion.  . benzonatate (TESSALON) 100 MG capsule Take 100 mg by mouth every 6 (six) hours as needed.   No facility-administered encounter medications on file as of 06/29/2016.     Orders for this  visit: No orders of the defined types were placed in this encounter.   Thank  you for the visitation and for allowing  [facility] to assist in the care of your patient. Our recommendations are noted above.  Please contact us if we can be of further service.  Stephanie AcreVishal Subrena Devereux, MD [facility] Office Number: [phone]

## 2016-06-29 NOTE — Assessment & Plan Note (Signed)
Previously had voriconazole treatment early in 2016, but then developed a fungus ball on CT recently. [facility], advised to cont with Sarcoid tx and restart Voriconazole, may need indefinite tx.  Plan - ID follow up with Dr. Sampson GoonFitzgerald, may restart Vori

## 2016-06-29 NOTE — Patient Instructions (Signed)
Follow up with Dr. Belia HemanKasa in 3 months - cont with Prednisone 10 mg daily - keep appointment with Cardiology - we will setup an appointment with you to see Dr. Sampson GoonFitzgerald, to restart voriconazole.

## 2016-06-29 NOTE — Assessment & Plan Note (Signed)
The plan for pulmonary sarcoid and cavitary lesion of the lung 

## 2016-06-29 NOTE — Assessment & Plan Note (Signed)
Left upper lobe cavitary lung lesion with significant hilar (bilateral) and maxillary lymphadenopathy. 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 Patient has completed prolonged course of voriconazole already.  Patient with good improvement in her respiratory status, however now with persistent cough.  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  Patient with possible progression of sarcoid (Fibrocavitary disease and diffuse opacities).  She is fairly stable and prednisone 10 mg.  I have discussed the case with CTS, and their recommendation is close surveillance and continued medical management. Patient has been re-bronched, currently with no positive cultures for bacterial or fungal or AFB. 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 [facility] ([facility]) -recs - in [place] with current psychoactive treatment, restarted voriconazole, if no improvement may need to consider surgical options  Plan: - Continue with prednisone 10mg , if cough starts to get worst may need to go back to last effective dose (15-20mg ) and wean slowly down.  - cont with Advair.  - Referral to infectious disease, reevaluate for voriconazole treatment

## 2016-07-02 ENCOUNTER — Ambulatory Visit
Admission: RE | Admit: 2016-07-02 | Discharge: 2016-07-02 | Disposition: A | Discharge status: Home / Self Care | Payer: Medicaid Other | Source: Ambulatory Visit | Attending: Cardiology | Admitting: Cardiology

## 2016-07-02 DIAGNOSIS — R002 Palpitations: Secondary | ICD-10-CM | POA: Diagnosis present

## 2016-07-09 ENCOUNTER — Telehealth: Payer: Self-pay | Admitting: *Deleted

## 2016-07-09 NOTE — Telephone Encounter (Signed)
error 

## 2016-07-14 ENCOUNTER — Encounter: Payer: Self-pay | Admitting: *Deleted

## 2016-09-09 ENCOUNTER — Ambulatory Visit (INDEPENDENT_AMBULATORY_CARE_PROVIDER_SITE_OTHER): Payer: Medicaid Other | Admitting: Cardiology

## 2016-09-09 ENCOUNTER — Encounter: Payer: Self-pay | Admitting: Cardiology

## 2016-09-09 VITALS — BP 104/60 | HR 90 | Ht <= 58 in | Wt 131.2 lb

## 2016-09-09 DIAGNOSIS — I493 Ventricular premature depolarization: Secondary | ICD-10-CM

## 2016-09-09 DIAGNOSIS — R002 Palpitations: Secondary | ICD-10-CM

## 2016-09-09 MED ORDER — METOPROLOL TARTRATE 25 MG PO TABS: 75.0000 mg | ORAL_TABLET | Freq: Two times a day (BID) | ORAL | 3 refills | Status: DC

## 2016-09-09 NOTE — Progress Notes (Signed)
Cardiology Office Note   Date:  09/09/2016   ID:  Jennifer [name], DOB 04-May-1971, MRN [id number]  Referring Doctor:  [facility]   Cardiologist:   Almond Lint, MD   Reason for consultation:  Chief Complaint  Patient presents with  . other    Follow up from Echo and Monitor. Meds reviewed by the pt. verbally. "doing well."       History of Present Illness: Jennifer [name] is a 46 y.o. female who presents for Follow-up after testing   Patient reports occasional palpitations, not more frequent nor intense than before. She is continuing to take metoprolol as prescribed before. Denies chest pain. 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 She is in the process of obtaining voriconazole for pulm infection.  No loss of consciousness. No PND, orthopnea.  ROS:  Please see the history of present illness. Aside from mentioned under HPI, all other systems are reviewed and negative.     Past Medical History:  Diagnosis Date  . Anxiety   . Asthma   . Depression   . Fibromyalgia   . Incontinence   . Lymphadenopathy   . Pneumonia    bilateral community acquired   . Sarcoidosis (HCC)   . Sepsis (HCC)   . Shortness of breath dyspnea   . Tachycardia     Past Surgical History:  Procedure Laterality Date  . CESAREAN SECTION     1992/1994  . ENDOBRONCHIAL ULTRASOUND N/A 01/27/2016   Procedure: ENDOBRONCHIAL ULTRASOUND;  Surgeon: Stephanie Acre, MD;  Location: [facility] ORS;  Service: Cardiopulmonary;  Laterality: N/A;  . LYMPH NODE BIOPSY    . VIDEO BRONCHOSCOPY N/A 01/27/2016   Procedure: VIDEO BRONCHOSCOPY WITH FLUORO;  Surgeon: Stephanie Acre, MD;  Location: [facility] ORS;  Service: Cardiopulmonary;  Laterality: N/A;     reports that she has never smoked. She has never used smokeless tobacco. She reports that she does not drink alcohol or use drugs.   family history includes Lung cancer in her mother.    Outpatient Medications Prior to Visit  Medication Sig Dispense Refill  . acetaminophen (TYLENOL) 500 MG tablet Take 500 mg by mouth every 6 (six) hours as needed.    Marland Kitchen ADVAIR DISKUS 250-50 MCG/DOSE AEPB Inhale 1 puff into the lungs as directed. (Patient taking differently: Inhale 1 puff into the lungs 2 (two) times daily. ) 60 each 2  . albuterol (ACCUNEB) 1.25 MG/3ML nebulizer solution Take 1 ampule by nebulization every 6 (six) hours as needed for wheezing.    . benzonatate (TESSALON) 100 MG capsule Take 100 mg by mouth every 6 (six) hours as needed.  0  . butalbital-acetaminophen-caffeine (FIORICET) 50-325-40 MG tablet Take 1-2 tablets by mouth every 6 (six) hours as needed for headache. 20 tablet 0  . citalopram (CELEXA) 20 MG tablet Take 20 mg by mouth at bedtime.  0  . diclofenac (VOLTAREN) 75 MG EC tablet Take 1 tablet (75 mg total) by mouth 2 (two) times daily. (Patient taking differently: Take 75 mg by mouth 2 (two) times daily as needed. ) 30 tablet 0  . fluticasone (FLONASE) 50 MCG/ACT nasal spray Place 2 sprays into both nostrils daily.  0  . ibuprofen (ADVIL,MOTRIN) 400 MG tablet Take 400 mg by mouth every 6 (six) hours as needed.    Marland Kitchen omeprazole (PRILOSEC) 40 MG capsule Take 1 capsule (40 mg total) by mouth daily. 30 capsule 3  . ondansetron (  ZOFRAN) 4 MG tablet Take 4 mg by mouth every 6 (six) hours as needed.  0  . polyethylene glycol powder (GLYCOLAX/MIRALAX) powder Take 17 g by mouth as needed.  0  . predniSONE (DELTASONE) 5 MG tablet Take 2 tablets (10 mg total) by mouth daily with breakfast. 60 tablet 2  . PROAIR HFA 108 (90 BASE) MCG/ACT inhaler Inhale 108 mcg into the lungs every 6 (six) hours as needed.    . pseudoephedrine (SUDAFED) 60 MG tablet Take 1 tablet (60 mg total) by mouth every 4 (four) hours as needed for congestion. 24 tablet 0  . metoprolol tartrate (LOPRESSOR) 50 MG tablet Take 1 tablet (50 mg total) by mouth 2 (two) times daily. 30 tablet 6   No  facility-administered medications prior to visit.      Allergies: Penicillin g    PHYSICAL EXAM: VS:  BP 104/60 (BP Location: Left Arm, Patient Position: Sitting, Cuff Size: Normal)   Pulse 90   Ht 4\' 9"  (1.448 m)   Wt 131 lb 4 oz (59.5 kg)   BMI 28.40 kg/m  , Body mass index is 28.4 kg/m. Wt Readings from Last 3 Encounters:  09/09/16 131 lb 4 oz (59.5 kg)  06/29/16 134 lb (60.8 kg)  06/09/16 134 lb 8 oz (61 kg)    GENERAL:  well developed, well nourished,, not in acute distress HEENT: normocephalic, pink conjunctivae, anicteric sclerae, no xanthelasma, normal dentition, oropharynx clear NECK:  no neck vein engorgement, JVP normal, no hepatojugular reflux, carotid upstroke brisk and symmetric, no bruit, no thyromegaly, no lymphadenopathy LUNGS:  good respiratory effort, clear to auscultation bilaterally CV:  PMI not displaced, no thrills, no lifts, S1 and S2 within normal limits, no palpable S3 or S4, no murmurs, no rubs, no gallops ABD:  Soft, nontender, nondistended, normoactive bowel sounds, no abdominal aortic bruit, no hepatomegaly, no splenomegaly MS: nontender back, no kyphosis, no scoliosis, no joint deformities EXT:  2+ DP/PT pulses, no edema, no varicosities, no cyanosis, no clubbing SKIN: warm, nondiaphoretic, normal turgor, no ulcers NEUROPSYCH: alert, oriented to person, place, and time, sensory/motor grossly intact, normal mood, appropriate affect  Recent Labs: 02/26/2016: BUN 14; Creatinine, Ser 0.75; Hemoglobin 13.9; Platelets 215; Potassium 3.7; Sodium 137   Lipid Panel No results found for: CHOL, TRIG, HDL, CHOLHDL, VLDL, LDLCALC, LDLDIRECT   Other studies Reviewed:  EKG:  The ekg from 06/09/2016 was personally reviewed by me and it revealed sinus rhythm, occasional PVCs. 91 BPM.  Additional studies/ records that were reviewed personally reviewed by me today include: Echo 06/29/2016: Left ventricle: The cavity size was normal. Systolic function was    normal. The estimated ejection fraction was in the range of 50%   to 55%. Wall motion was normal; there were no regional wall   motion abnormalities. Left ventricular diastolic function   parameters were normal. - Mitral valve: There was mild regurgitation. - Left atrium: The atrium was normal in size. - Right ventricle: Systolic function was normal. - Pulmonary arteries: Systolic pressure was within the normal   range.  Holter monitor 07/08/2016: Overall rhythm was sinus. Heart rate ranged from 73-141 bpm, average of 87 BPM.  No high grade supraventricular ectopy: 103 isolated PACs.  Ventricular ectopy comprised 13% of the total number of beats: 16,494 isolated PVCs, 51 ventricular bigeminal cycles.  No evidence of atrial fibrillation.   ASSESSMENT AND PLAN:  Palpitations PVCs noted on EKG Discussed findings of echo and Holter monitor. 13% PVC burden. No ventricular runs. Patient  contact information will need to be updated in our records. Attempted to contact her with the results of her tests several times before this visit. Amend higher dose of metoprolol trial with 75 mg twice a day. Patient may start with 70 5 AM, 50 p.m. for one week and uptitrate as tolerated to 75 twice a day. Blood pressure monitor recommended. At some point, will likely need a cardiac MRI to see if there is cardiac involvement in sarcoidosis. Patient verbalized understanding and agreed with plan.  Current medicines are reviewed at length with the patient today.  The patient does not have concerns regarding medicines.  Labs/ tests ordered today include:  Orders Placed This Encounter  Procedures  . EKG 12-Lead    I had a lengthy and detailed discussion with the patient regarding diagnoses, prognosis, diagnostic options, treatment options , and side effects of medications.   I counseled the patient on importance of lifestyle modification including heart healthy diet, regular physical  activity.   Disposition:   FU with undersigned In 3 months   Signed, Almond Lint, MD  09/09/2016 1:59 PM    [facility]  This note was generated in part with voice recognition software and I apologize for any typographical errors that were not detected and corrected.

## 2016-09-09 NOTE — Patient Instructions (Addendum)
Medication Instructions:  Your physician has recommended you make the following change in your medication:  1. INCREASE Metoprolol 75 mg in the morning and then 50 mg at night. Then go to 75 mg twice daily if tolerated. New prescription sent in with instructions.   Follow-Up: Your physician recommends that you schedule a follow-up appointment in: 3 months with Dr. Alvino ChapelIngal.   It was a pleasure seeing you today here in the office. Please do not hesitate to give us a call back if you have any further questions. [phone]  [place] CellarPamela A. RN, BSN      Premature Ventricular Contraction A premature ventricular contraction (PVC) is a common irregularity in the normal heart rhythm. These contractions are extra heartbeats that start in the heart ventricles and occur too early in the normal sequence. During the PVC, the heart's normal electrical pathway is not used, so the beat is shorter and less effective. In most cases, these contractions come and go and do not require treatment. What are the causes? In many cases, the cause may not be known. Common causes of the condition include:  Smoking.  Drinking alcohol.  Caffeine.  Certain medicines.  Some illegal drugs.  Stress. Certain medical conditions can also cause PVCs:  Changes in minerals in the blood (electrolytes).  Heart failure.  Heart valve problems.  Low blood oxygen levels or high carbon dioxide levels.  Heart attack, or coronary artery disease. What are the signs or symptoms? The main symptom of this condition is a fast or skipped heartbeat (palpitations). Other symptoms include:  Chest pain.  Shortness of breath.  Feeling tired.  Dizziness. In some cases, there are no symptoms. How is this diagnosed? This condition may be diagnosed based on:  Your medical history.  A physical exam. During the exam, the health care provider will check for irregular heartbeats.  Tests, such as:  An ECG (electrocardiogram) to  monitor the electrical activity of your heart.  Holter monitor testing. This involves wearing a device that clips to your clothing and monitors the electrical activity of your heart over longer periods of time.  Stress tests to see how exercise affects your heart rhythm and blood supply.  Echocardiogram. This test uses sound waves (ultrasound) to produce an image of your heart.  Electrophysiology study. This test checks the electric pathways in your heart. How is this treated? Treatment depends on any underlying conditions, the type of PVCs that you are having, and how much the symptoms are interfering with your daily life. Possible treatments include:  Avoiding things that can trigger the premature contractions, such as caffeine or alcohol.  Medicines. These may be given if symptoms are severe or if the extra heartbeats are frequent.  Treatment for any underlying condition that is found to be the cause of the contractions.  Catheter ablation. This procedure destroys the heart tissues that send abnormal signals. In some cases, no treatment is required. Follow these instructions at home: Lifestyle Follow these instructions as told by your health care provider:  Do not use any products that contain nicotine or tobacco, such as cigarettes and e-cigarettes. If you need help quitting, ask your health care provider.  If caffeine triggers episodes of PVC, do not eat, drink, or use anything with caffeine in it.  If caffeine does not seem to trigger episodes, consume caffeine in moderation.  If alcohol triggers episodes of PVC, do not drink alcohol.  If alcohol does not seem to trigger episodes, limit alcohol intake to no more  than 1 drink a day for nonpregnant women and 2 drinks a day for men. One drink equals 12 oz of beer, 5 oz of wine, or 1 oz of hard liquor.  Exercise regularly. Ask your health care provider what type of exercise is safe for you.  Find healthy ways to manage stress.  Avoid stressful situations when possible.  Try to get at least 7-9 hours of sleep each night, or as much as recommended by your health care provider.  Do not use illegal drugs. General instructions  Take over-the-counter and prescription medicines only as told by your health care provider.  Keep all follow-up visits as told by your health care provider. This is important. Get help right away if:  You feel palpitations that are frequent or continual.  You have chest pain.  You have shortness of breath.  You have sweating for no reason.  You have nausea and vomiting.  You become light-headed or you faint. This information is not intended to replace advice given to you by your health care provider. Make sure you discuss any questions you have with your health care provider. Document Released: 04/10/2004 Document Revised: 04/17/2016 Document Reviewed: 01/29/2016 Elsevier Interactive Patient Education  2017 [employer].

## 2016-10-08 ENCOUNTER — Ambulatory Visit: Payer: Medicaid Other | Admitting: Internal Medicine

## 2016-10-22 ENCOUNTER — Ambulatory Visit (INDEPENDENT_AMBULATORY_CARE_PROVIDER_SITE_OTHER): Payer: Medicaid Other | Admitting: Internal Medicine

## 2016-10-22 ENCOUNTER — Encounter: Payer: Self-pay | Admitting: Internal Medicine

## 2016-10-22 VITALS — BP 112/62 | HR 90 | Wt 131.0 lb

## 2016-10-22 DIAGNOSIS — D860 Sarcoidosis of lung: Secondary | ICD-10-CM | POA: Diagnosis not present

## 2016-10-22 DIAGNOSIS — D869 Sarcoidosis, unspecified: Secondary | ICD-10-CM

## 2016-10-22 MED ORDER — PREDNISONE 5 MG PO TABS: 10.0000 mg | ORAL_TABLET | Freq: Every day | ORAL | 2 refills | Status: DC

## 2016-10-22 MED ORDER — OXYCODONE-ACETAMINOPHEN 5-325 MG PO TABS: 1.0000 | ORAL_TABLET | Freq: Four times a day (QID) | ORAL | 0 refills | Status: AC | PRN

## 2016-10-22 MED ORDER — OMEPRAZOLE 40 MG PO CPDR
40.0000 mg | DELAYED_RELEASE_CAPSULE | Freq: Every day | ORAL | 3 refills | Status: DC
Start: 2016-10-22 — End: 2017-08-10

## 2016-10-22 NOTE — Addendum Note (Signed)
Addended by: Erin FullingKASA, Darshana Curnutt on: 10/22/2016 12:38 PM   Modules accepted: Orders

## 2016-10-22 NOTE — Progress Notes (Addendum)
[facility] Pulmonary Medicine Consultation      MRN# [id number] Jennifer [name] 1971-09-05   CC: Chief Complaint  Patient presents with  . Follow-up    SOB at all times: cough:       Brief History: Synopsis: 46 year old female with left upper lobe cavitary lesion, history of bilateral pneumonia, currently being followed by pulmonary and infectious disease. Cavitary lesion differential at this time includes fungal infection versus sarcoidosis. 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  acetaminophen (TYLENOL) 500 MG tablet, Take 500 mg by mouth every 6 (six) hours as needed., Disp: , Rfl:  .  ADVAIR DISKUS 250-50 MCG/DOSE AEPB, Inhale 1 puff into the lungs as directed. (Patient taking differently: Inhale 1 puff into the lungs 2 (two) times daily. ), Disp: 60 each, Rfl: 2 .  albuterol (ACCUNEB) 1.25 MG/3ML nebulizer solution, Take 1 ampule by nebulization every 6 (six) hours as needed for wheezing., Disp: , Rfl:  .  benzonatate (TESSALON) 100 MG capsule, Take 100 mg by mouth every 6 (six) hours as needed., Disp: , Rfl: 0 .  butalbital-acetaminophen-caffeine (FIORICET) 50-325-40 MG tablet, Take 1-2 tablets by mouth every 6 (six) hours as needed for headache., Disp: 20 tablet, Rfl: 0 .  citalopram (CELEXA) 20 MG tablet, Take 20 mg by mouth at bedtime., Disp: , Rfl: 0 .  diclofenac (VOLTAREN) 75 MG EC tablet, Take 1 tablet (75 mg total) by mouth 2 (two) times daily. (Patient taking differently: Take 75 mg by mouth 2 (two) times daily as needed. ), Disp: 30 tablet, Rfl: 0 .  fluticasone (FLONASE) 50 MCG/ACT nasal spray, Place 2 sprays into both nostrils  daily., Disp: , Rfl: 0 .  ibuprofen (ADVIL,MOTRIN) 400 MG tablet, Take 400 mg by mouth every 6 (six) hours as needed., Disp: , Rfl:  .  metoprolol tartrate (LOPRESSOR) 25 MG tablet, Take 3 tablets (75 mg total) by mouth 2 (two) times daily. Pt to take 75 AM and 50 PM for one week then increase to 75 twice daily, Disp: 180 tablet, Rfl: 3 .  omeprazole (PRILOSEC) 40 MG capsule, Take 1 capsule (40 mg total) by mouth daily., Disp: 30 capsule, Rfl: 3 .  ondansetron (ZOFRAN) 4 MG tablet, Take 4 mg by mouth every 6 (six) hours as needed., Disp: , Rfl: 0 .  polyethylene glycol powder (GLYCOLAX/MIRALAX) powder, Take 17 g by mouth as needed., Disp: , Rfl: 0 .  predniSONE (DELTASONE) 5 MG tablet, Take 2 tablets (10 mg total) by mouth daily with breakfast., Disp: 60 tablet, Rfl: 2 .  PROAIR HFA 108 (90 BASE) MCG/ACT inhaler, Inhale 108 mcg into the lungs every 6 (six) hours as needed., Disp: , Rfl:  .  pseudoephedrine (SUDAFED) 60 MG tablet, Take 1 tablet (60 mg total) by mouth every 4 (four) hours as needed for congestion., Disp: 24 tablet, Rfl: 0    Review of Systems  Constitutional: Negative for chills, fever and weight loss.  HENT: Negative for ear discharge, ear pain, hearing loss, nosebleeds and tinnitus.   Eyes: Negative for blurred vision and pain.  Respiratory: Positive for cough and shortness of breath. Negative for sputum production.   Cardiovascular: Negative for  chest pain and palpitations.  Gastrointestinal: Negative for heartburn, nausea and vomiting.  Skin: Negative for rash.  Neurological: Negative for dizziness and headaches.  Endo/Heme/Allergies: Negative for environmental allergies. Does not bruise/bleed easily.     Review of Systems:  Gen:  Denies  fever, sweats, chills weigh loss   HEENT: Denies blurred vision, double vision, ear pain, eye pain, hearing loss, nose bleeds, sore throat  Cardiac:  No dizziness, chest pain or heaviness, chest tightness,edema  Resp:   Denies  cough or sputum porduction,+ shortness of breath,-wheezing, -hemoptysis,   Other:  All other systems negative   Allergies:  Penicillin g  Physical Examination:  VS: BP 112/62 (BP Location: Left Arm, Cuff Size: Normal)   Pulse 90   Wt 131 lb (59.4 kg)   SpO2 97%   BMI 28.35 kg/m   General Appearance: No distress  HEENT: PERRLA, no ptosis, no other lesions noticed Pulmonary:good respiratory effort, no wheezes, no crackles.  Cardiovascular:  Normal S1,S2.  No m/r/g.     Abdomen:Exam: Benign, Soft, non-tender, No masses  Skin:   warm, no rashes, no ecchymosis  Extremities: normal, no cyanosis, clubbing, warm with normal capillary refill.      Rad results: (The following images and results were reviewed by Dr. Dema Severin on 10/22/2016). CT Chest  03/20/16 CT CHEST WITHOUT CONTRAST  TECHNIQUE: Multidetector CT imaging of the chest was performed following the standard protocol without IV contrast.  COMPARISON: 12/31/2015; 07/04/2015; 11/27/2014; 11/05/2014  FINDINGS: Cardiovascular: Normal heart size. No pericardial effusion.  Mediastinum/Nodes: Prominent precarinal lymph node is unchanged, measuring approximately 0.7 cm in greatest short axis diameter (image 53, series 2). Previous identified bilateral hilar adenopathy is suboptimally evaluated on this noncontrast examination. No definitive axillary adenopathy.  Lungs/Pleura: Re- demonstrated left upper lobe associated volume loss, bronchiectasis and adjacent cavitations which have minimally increased in size in the interval with dominant cranial cavity measuring approximately 2.3 x 1.9 cm (coronal image 61, series 5), previously, 2.0 x 1.7 cm and the caudal cavity measuring approximately 1.7 x 2.1 cm (coronal image 59, series 5), previously 1.8 x 1.7 cm. 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  Suspected minimal improvement  in previously noted extensive bilateral upper lobe predominant perilymphatic distributed macronodularity (representative image 37, series 3, compared to image 43, series 3 on the 12/2015 examination), compatible with history of pulmonary sarcoidosis. No new focal airspace opacities. No pleural effusion or pneumothorax.  Upper Abdomen: Noncontrast evaluation of the upper abdomen is unremarkable  Musculoskeletal: No acute or aggressive osseous abnormalities.  Regional soft tissues appear normal. Normal noncontrast appearance of the thyroid gland.  IMPRESSION: 1. 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 2. No new focal airspace opacities.   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 Seen by [facility] Pulmonary, advised to cont with Sarcoid tx and restart Voriconazole, may need indefinite tx.  Plan - ID follow up with Dr. 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   Patient with good improvement in her respiratory status, however now with persistent cough.   Patient with possible progression of sarcoid (Fibrocavitary disease and diffuse opacities).   She is fairly stable and prednisone 15 mg.     Plan: - Continue with prednisone 15mg  -Vori as per ID,  -inhalers as prescribed Percocet for pleurisy.     I have personally obtained a  history, examined the patient, evaluated Pertinent laboratory and [employer]/imaging results, and  formulated the assessment and plan   The Patient requires high complexity decision making for assessment and support, frequent evaluation and titration of therapies.   Patient satisfied with Plan of action and management. All questions answered  Jennifer [name], M.D.  [facility]  Medical Director [facility] Medical Director [facility] Card[facility] Department

## 2016-10-22 NOTE — Patient Instructions (Addendum)
Pain meds as needed Continue inhalers as prescribed

## 2016-10-25 ENCOUNTER — Encounter: Payer: Self-pay | Admitting: Internal Medicine

## 2016-10-25 DIAGNOSIS — D860 Sarcoidosis of lung: Secondary | ICD-10-CM

## 2016-10-29 ENCOUNTER — Telehealth: Payer: Self-pay | Admitting: *Deleted

## 2016-10-29 NOTE — Telephone Encounter (Signed)
Pt informed she doesn't need O2 for night time use. Nothing further needed.

## 2017-01-08 ENCOUNTER — Ambulatory Visit: Payer: Medicaid Other | Admitting: Internal Medicine

## 2017-01-19 ENCOUNTER — Encounter: Payer: Self-pay | Admitting: Internal Medicine

## 2017-01-19 ENCOUNTER — Telehealth: Payer: Self-pay | Admitting: *Deleted

## 2017-01-19 ENCOUNTER — Ambulatory Visit (INDEPENDENT_AMBULATORY_CARE_PROVIDER_SITE_OTHER): Payer: Medicaid Other | Admitting: Internal Medicine

## 2017-01-19 VITALS — BP 100/60 | HR 85 | Resp 16 | Ht <= 58 in | Wt 136.0 lb

## 2017-01-19 DIAGNOSIS — D869 Sarcoidosis, unspecified: Secondary | ICD-10-CM | POA: Diagnosis not present

## 2017-01-19 MED ORDER — CETIRIZINE HCL 10 MG PO TABS: 10.0000 mg | ORAL_TABLET | Freq: Every day | ORAL | 3 refills | Status: DC

## 2017-01-19 MED ORDER — AZELASTINE-FLUTICASONE 137-50 MCG/ACT NA SUSP: 1.0000 | Freq: Two times a day (BID) | NASAL | 0 refills | Status: DC

## 2017-01-19 NOTE — Progress Notes (Signed)
[facility] Pulmonary Medicine Consultation      MRN# [id number] Jennifer [name] [birth year]   CC: Chief Complaint  Patient presents with  . Follow-up  . pulmonary sarcoid    Brief History: Synopsis: 46 year old female with left upper lobe cavitary lesion, history of bilateral pneumonia, currently being followed by pulmonary and infectious disease. Cavitary lesion differential at this time includes fungal infection versus sarcoidosis. 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  acetaminophen (TYLENOL) 500 MG tablet, Take 500 mg by mouth every 6 (six) hours as needed., Disp: , Rfl:  .  ADVAIR DISKUS 250-50 MCG/DOSE AEPB, Inhale 1 puff into the lungs as directed. (Patient taking differently: Inhale 1 puff into the lungs 2 (two) times daily. ), Disp: 60 each, Rfl: 2 .  albuterol (ACCUNEB) 1.25 MG/3ML nebulizer solution, Take 1 ampule by nebulization every 6 (six) hours as needed for wheezing., Disp: , Rfl:  .  benzonatate (TESSALON) 100 MG capsule, Take 100 mg by mouth every 6 (six) hours as needed., Disp: , Rfl: 0 .  butalbital-acetaminophen-caffeine (FIORICET) 50-325-40 MG tablet, Take 1-2 tablets by mouth every 6 (six) hours as needed for headache., Disp: 20 tablet, Rfl: 0 .  citalopram (CELEXA) 20 MG tablet, Take 20 mg by mouth at bedtime., Disp: , Rfl: 0 .  diclofenac (VOLTAREN) 75 MG EC tablet, Take 1 tablet (75 mg total) by mouth 2 (two) times daily. (Patient taking differently: Take 75 mg by mouth 2 (two) times daily as needed. ), Disp: 30 tablet, Rfl: 0 .  fluticasone (FLONASE)  50 MCG/ACT nasal spray, Place 2 sprays into both nostrils daily., Disp: , Rfl: 0 .  ibuprofen (ADVIL,MOTRIN) 400 MG tablet, Take 400 mg by mouth every 6 (six) hours as needed., Disp: , Rfl:  .  metoprolol tartrate (LOPRESSOR) 25 MG tablet, Take 3 tablets (75 mg total) by mouth 2 (two) times daily. Pt to take 75 AM and 50 PM for one week then increase to 75 twice daily, Disp: 180 tablet, Rfl: 3 .  omeprazole (PRILOSEC) 40 MG capsule, Take 1 capsule (40 mg total) by mouth daily., Disp: 30 capsule, Rfl: 3 .  ondansetron (ZOFRAN) 4 MG tablet, Take 4 mg by mouth every 6 (six) hours as needed., Disp: , Rfl: 0 .  oxyCODONE-acetaminophen (ROXICET) 5-325 MG tablet, Take 1 tablet by mouth every 6 (six) hours as needed., Disp: 20 tablet, Rfl: 0 .  polyethylene glycol powder (GLYCOLAX/MIRALAX) powder, Take 17 g by mouth as needed., Disp: , Rfl: 0 .  predniSONE (DELTASONE) 5 MG tablet, Take 2 tablets (10 mg total) by mouth daily with breakfast., Disp: 60 tablet, Rfl: 2 .  PROAIR HFA 108 (90 BASE) MCG/ACT inhaler, Inhale 108 mcg into the lungs every 6 (six) hours as needed., Disp: , Rfl:  .  pseudoephedrine (SUDAFED) 60 MG tablet, Take 1 tablet (60 mg total) by mouth every 4 (four) hours as needed for congestion., Disp: 24 tablet, Rfl: 0    Review of Systems  Constitutional: Negative for chills, fever and weight loss.  HENT: Negative for ear discharge, ear pain, hearing  loss, nosebleeds and tinnitus.   Eyes: Negative for blurred vision and pain.  Respiratory: Positive for cough and shortness of breath. Negative for sputum production.   Cardiovascular: Negative for chest pain and palpitations.  Gastrointestinal: Negative for heartburn, nausea and vomiting.  Skin: Negative for rash.  Neurological: Negative for dizziness and headaches.  Endo/Heme/Allergies: Negative for environmental allergies. Does not bruise/bleed easily.     Review of Systems:  Gen:  Denies  fever, sweats, chills weigh loss  HEENT:  Denies blurred vision, double vision, ear pain, eye pain, hearing loss, nose bleeds, sore throat Cardac:  No dizziness, chest pain or heaviness, chest tightness,edema Resp:   +cough + shortness of breath,-wheezing, -hemoptysis,  Other:  All other systems negative   Allergies:  Penicillin g  Physical Examination:  VS: Ht 4\' 9"  (1.448 m)   General Appearance: No distress  HEENT: PERRLA, no ptosis, no other lesions noticed Pulmonary:good respiratory effort, no wheezes, no crackles.  Cardiovascular:  Normal S1,S2.  No m/r/g.     Abdomen:Exam: Benign, Soft, non-tender, No masses  Skin:   warm, no rashes, no ecchymosis  Extremities: normal, no cyanosis, clubbing, warm with normal capillary refill.     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 [facility], advised to cont with Sarcoid tx and restart Voriconazole, may need indefinite tx.  Plan - ID follow up with Dr. 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   Patient with good improvement in her respiratory status, however now with persistent cough.   Patient with possible progression of sarcoid (Fibrocavitary disease and diffuse opacities).   She is fairly stable and prednisone 15 mg.     Plan: - Continue with prednisone 15mg  -Vori as per ID,  -inhalers as prescribed Percocet for pleurisy.   Allergic Rhinitis Start dymista and assess Start zyrtec   Follow up in 1 month  Patient satisfied with Plan of action and management. All  questions answered  Jennifer [name], M.D.  [facility]  Medical Director [facility] Medical Director [facility] Cardio-Pulmonary Department

## 2017-01-19 NOTE — Addendum Note (Signed)
Addended by: Alease FrameARTER, SONYA S on: 01/19/2017 11:10 AM   Modules accepted: Orders

## 2017-01-19 NOTE — Addendum Note (Signed)
Addended by: Alease FrameARTER, SONYA S on: 01/19/2017 11:22 AM   Modules accepted: Orders

## 2017-01-19 NOTE — Telephone Encounter (Signed)
SMW needs to be rescheduled from 02/03/17. All SMW are done PM.

## 2017-01-19 NOTE — Patient Instructions (Addendum)
Start Dymista Try Zyrtec Check   Follow up in 1 month

## 2017-01-25 ENCOUNTER — Ambulatory Visit: Payer: Medicaid Other | Admitting: Internal Medicine

## 2017-02-03 ENCOUNTER — Ambulatory Visit (INDEPENDENT_AMBULATORY_CARE_PROVIDER_SITE_OTHER): Payer: Medicaid Other | Admitting: *Deleted

## 2017-02-03 DIAGNOSIS — R0600 Dyspnea, unspecified: Secondary | ICD-10-CM

## 2017-02-03 DIAGNOSIS — R0689 Other abnormalities of breathing: Secondary | ICD-10-CM

## 2017-02-03 NOTE — Patient Instructions (Signed)
Patient has next follow up scheduled.

## 2017-02-03 NOTE — Progress Notes (Signed)
SIX MIN WALK 02/03/2017 12/13/2014  Medications none Metoprolol  Supplimental Oxygen during Test? (L/min) No No  Laps 5 1  Partial Lap (in Meters) 2 24  Baseline BP (sitting) 100/70 90/60  Baseline Heartrate 109 88  Baseline Dyspnea (Borg Scale) 3 0.5  Baseline Fatigue (Borg Scale) 3 6  Baseline SPO2 98 97  BP (sitting) 110/80 110/76  Heartrate 107 84  Dyspnea (Borg Scale) 4 1  Fatigue (Borg Scale) 4 10  SPO2 98 98  BP (sitting) 110/76 108/70  Heartrate 109 82  SPO2 98 97  Stopped or Paused before Six Minutes No Yes  Other Symptoms at end of Exercise - Pt complained of dizziness, room spinning, she walked at slow pace holding the wall the entire time. She has to stop twice for 1 min each.  Interpretation Dizziness Dizziness  Distance Completed 242 72  Tech Comments: Pt started out at brisk walk for 1st 1.5 minutes. Patient then started walking at normal pace with complaint of fatigue. After completion on 6 minutes patient felt dizzy but did not desat. Pt walked a very slow pace holding onto wall entire time.  Provider Comments: - noted, see clinic nite     SMW performed today.

## 2017-02-17 ENCOUNTER — Encounter: Payer: Self-pay | Admitting: Internal Medicine

## 2017-02-17 ENCOUNTER — Ambulatory Visit (INDEPENDENT_AMBULATORY_CARE_PROVIDER_SITE_OTHER): Payer: Medicaid Other | Admitting: Internal Medicine

## 2017-02-17 VITALS — BP 80/48 | HR 82 | Resp 16 | Ht <= 58 in | Wt 134.0 lb

## 2017-02-17 DIAGNOSIS — D860 Sarcoidosis of lung: Secondary | ICD-10-CM | POA: Diagnosis not present

## 2017-02-17 DIAGNOSIS — B449 Aspergillosis, unspecified: Secondary | ICD-10-CM

## 2017-02-17 NOTE — Patient Instructions (Signed)
Continue prednisone as prescribed Continue antifungal therapy as prescribed

## 2017-02-17 NOTE — Progress Notes (Signed)
[facility] Consult[facility]      MRN# [id number] Brigitte PulseRenee B Wishon 09/16/1970   CC: Chief Complaint  Patient presents with  . Sarcoidosis    f/u smw test    Brief History: Synopsis: 46 year old female with left upper lobe cavitary lesion, history of bilateral pneumonia, currently being followed by pulmonary and infectious disease. Cavitary lesion differential at this time includes fungal infection versus sarcoidosis. 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  acetaminophen (TYLENOL) 500 MG tablet, Take 500 mg by mouth every 6 (six) hours as needed., Disp: , Rfl:  .  ADVAIR DISKUS 250-50 MCG/DOSE AEPB, Inhale 1 puff into the lungs as directed. (Patient taking differently: Inhale 1 puff into the lungs 2 (two) times daily. ), Disp: 60 each, Rfl: 2 .  albuterol (ACCUNEB) 1.25 MG/3ML nebulizer solution, Take 1 ampule by nebulization every 6 (six) hours as needed for wheezing., Disp: , Rfl:  .  Azelastine-Fluticasone 137-50 MCG/ACT SUSP, Place 1 spray into the nose 2 (two) times daily., Disp: 1 Bottle, Rfl: 0 .  benzonatate (TESSALON) 100 MG capsule, Take 100 mg by mouth every 6 (six) hours as needed., Disp: , Rfl: 0 .  butalbital-acetaminophen-caffeine (FIORICET) 50-325-40 MG tablet, Take 1-2 tablets by mouth every 6 (six) hours as needed for headache., Disp: 20 tablet, Rfl: 0 .  cetirizine (ZYRTEC) 10 MG tablet, Take 1 tablet (10 mg total) by  mouth daily., Disp: 30 tablet, Rfl: 3 .  citalopram (CELEXA) 20 MG tablet, Take 20 mg by mouth at bedtime., Disp: , Rfl: 0 .  diclofenac (VOLTAREN) 75 MG EC tablet, Take 1 tablet (75 mg total) by mouth 2 (two) times daily. (Patient taking differently: Take 75 mg by mouth 2 (two) times daily as needed. ), Disp: 30 tablet, Rfl: 0 .  fluticasone (FLONASE) 50 MCG/ACT nasal spray, Place 2 sprays into both nostrils daily., Disp: , Rfl: 0 .  ibuprofen (ADVIL,MOTRIN) 400 MG tablet, Take 400 mg by mouth every 6 (six) hours as needed., Disp: , Rfl:  .  metoprolol tartrate (LOPRESSOR) 25 MG tablet, Take 3 tablets (75 mg total) by mouth 2 (two) times daily. Pt to take 75 AM and 50 PM for one week then increase to 75 twice daily, Disp: 180 tablet, Rfl: 3 .  omeprazole (PRILOSEC) 40 MG capsule, Take 1 capsule (40 mg total) by mouth daily., Disp: 30 capsule, Rfl: 3 .  ondansetron (ZOFRAN) 4 MG tablet, Take 4 mg by mouth every 6 (six) hours as needed., Disp: , Rfl: 0 .  oxyCODONE-acetaminophen (ROXICET) 5-325 MG tablet, Take 1 tablet by mouth every 6 (six) hours as needed., Disp: 20 tablet, Rfl: 0 .  polyethylene glycol powder (GLYCOLAX/MIRALAX) powder, Take 17 g by mouth as needed., Disp: , Rfl: 0 .  predniSONE (DELTASONE) 5 MG tablet, Take 2 tablets (10 mg total) by mouth daily with breakfast., Disp: 60 tablet, Rfl: 2 .  PROAIR HFA 108 (90 BASE)  MCG/ACT inhaler, Inhale 108 mcg into the lungs every 6 (six) hours as needed., Disp: , Rfl:  .  pseudoephedrine (SUDAFED) 60 MG tablet, Take 1 tablet (60 mg total) by mouth every 4 (four) hours as needed for congestion., Disp: 24 tablet, Rfl: 0    Review of Systems  Constitutional: Negative for chills, fever and weight loss.  HENT: Negative for ear discharge, ear pain, hearing loss, nosebleeds and tinnitus.   Eyes: Negative for blurred vision and pain.  Respiratory: Negative for cough, sputum production and shortness of breath.   Cardiovascular: Negative for chest  pain and palpitations.  Gastrointestinal: Negative for heartburn, nausea and vomiting.  Skin: Negative for rash.  Neurological: Negative for dizziness and headaches.  Endo/Heme/Allergies: Positive for environmental allergies. Does not bruise/bleed easily.     Review of Systems:  Gen:  Denies  fever, sweats, chills weigh loss  HEENT: Denies blurred vision, double vision, ear pain, eye pain, hearing loss, nose bleeds, sore throat Cardac:  No dizziness, chest pain or heaviness, chest tightness,edema Resp:   -cough + shortness of breath,-wheezing, -hemoptysis,  Other:  All other systems negative   Allergies:  Penicillin g  Physical Examination:  BP (!) 80/48 (BP Location: Right Arm, Cuff Size: Normal)   Pulse 82   Resp 16   Ht 4\' 10"  (1.473 m)   Wt 134 lb (60.8 kg)   SpO2 97%   BMI 28.01 kg/m   General Appearance: No distress  HEENT: PERRLA, no ptosis, no other lesions noticed Pulmonary:good respiratory effort, no wheezes, no crackles.  Cardiovascular:  Normal S1,S2.  No m/r/g.     Abdomen:Exam: Benign, Soft, non-tender, No masses  Skin:   warm, no rashes, no ecchymosis  Extremities: normal, no cyanosis, clubbing, warm with normal capillary refill.     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 Seen by [facility], advised to cont with Sarcoid tx and restart Voriconazole, will likely need indefinite tx. Plan - ID follow up with Dr. 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   Patient with good improvement in her respiratory status on chronic prednisone therapy of 15 mg BID She DOES NOT Want to decrease her prednisone dose at this time, she feels her current prednisone dosage is stabilizing her respiratory status I have explained the risks of chronic prednisone therapy and she understands the risks  Patient with possible progression of sarcoid (Fibrocavitary disease and diffuse opacities).   She is fairly stable and prednisone 15 mg.   Plan: - Continue with prednisone 15mg  BID -Vori as per ID -inhalers as prescribed Percocet for pleurisy.   Allergic Rhinitis OTC antihistamines and nasal sprays as needed   Follow up in 6 months  Patient satisfied with Plan of action and management. All questions answered  [name], M.D.  [facility]  Medical Director [facility] Medical Director [facility] Cardio-Pulmonary Department

## 2017-03-09 ENCOUNTER — Encounter: Payer: Self-pay | Admitting: Internal Medicine

## 2017-03-09 ENCOUNTER — Ambulatory Visit (INDEPENDENT_AMBULATORY_CARE_PROVIDER_SITE_OTHER): Payer: Medicaid Other | Admitting: Internal Medicine

## 2017-03-09 VITALS — BP 90/58 | HR 91 | Ht <= 58 in | Wt 135.8 lb

## 2017-03-09 DIAGNOSIS — I493 Ventricular premature depolarization: Secondary | ICD-10-CM | POA: Diagnosis not present

## 2017-03-09 NOTE — Patient Instructions (Signed)
Medication Instructions:  No changes  Labwork: None at this time.   Testing/Procedures: Your physician has recommended that you wear a holter monitor. Holter monitors are medical devices that record the heart's electrical activity. Doctors most often use these monitors to diagnose arrhythmias. Arrhythmias are problems with the speed or rhythm of the heartbeat. The monitor is a small, portable device. You can wear one while you do your normal daily activities. This is usually used to diagnose what is causing palpitations/syncope (passing out).    Follow-Up: Your physician wants you to follow-up in: 6 months with Dr. Okey DupreEnd. You will receive a reminder letter in the mail two months in advance. If you don't receive a letter, please call our office to schedule the follow-up appointment.  It was a pleasure seeing you today here in the office. Please do not hesitate to give us a call back if you have any further questions. [phone]  Henrietta CellarPamela A. RN, BSN     Holter Monitoring A Holter monitor is a small device that is used to detect abnormal heart rhythms. It clips to your clothing and is connected by wires to flat, sticky disks (electrodes) that attach to your chest. It is worn continuously for 24-48 hours. Follow these instructions at home:  Wear your Holter monitor at all times, even while exercising and sleeping, for as long as directed by your health care provider.  Make sure that the Holter monitor is safely clipped to your clothing or close to your body as recommended by your health care provider.  Do not get the monitor or wires wet.  Do not put body lotion or moisturizer on your chest.  Keep your skin clean.  Keep a diary of your daily activities, such as walking and doing chores. If you feel that your heartbeat is abnormal or that your heart is fluttering or skipping a beat: ? Record what you are doing when it happens. ? Record what time of day the symptoms occur.  Return your  Holter monitor as directed by your health care provider.  Keep all follow-up visits as directed by your health care provider. This is important. Get help right away if:  You feel lightheaded or you faint.  You have trouble breathing.  You feel pain in your chest, upper arm, or jaw.  You feel sick to your stomach and your skin is pale, cool, or damp.  You heartbeat feels unusual or abnormal. This information is not intended to replace advice given to you by your health care provider. Make sure you discuss any questions you have with your health care provider. Document Released: 05/22/2004 Document Revised: 01/30/2016 Document Reviewed: 04/02/2014 Elsevier Interactive Patient Education  [employer]2018 Else[employer] Inc.

## 2017-03-09 NOTE — Progress Notes (Signed)
Follow-up Outpatient Visit Date: 03/09/2017  Primary Care Provider: Judeen HammansSoles, Meredith Key, MD [name] [place] [id number]  Chief Complaint: Follow-up palpitations  HPI:  Ms. Jennifer [name] is a 46 y.o. year-old female with history of frequent PVCs, sarcoidosis, fibromyalgia, asthma, and anxiety, who presents for follow-up of palpitations. She was previously followed by Dr. Alvino ChapelIngal, having last been seen in 09/2016. At that time, metoprolol was increased, as frequent PVCs were noted on preceding Holter monitor. Ms. Jennifer [name] has tolerated increased metoprolol well without lightheadedness. She has chronic fatigue, but states this is unchanged. She continues to have occasional PVCs, albeit less frequent than in the past. She has stable shortness of breath. She notes occasional chest pain with coughing at night. She triggers this to muscle soreness due to repetitive coughing. She does not have any exertional chest pain. She also denies orthopnea, PND, and edema.  --------------------------------------------------------------------------------------------------  Cardiovascular History & Procedures: Cardiovascular Problems:  Frequent PVCs  Risk Factors:  None  Cath/PCI:  None  CV Surgery:  None  EP Procedures and Devices:  24-hour Holter monitor (06/29/16): Predominantly sinus rhythm with frequent PVCs and rare PACs. PVC burden was 13%.  Non-Invasive Evaluation(s):  TTE (06/29/16): Normal LV size with LVEF of 50-55%. Normal wall motion and diastolic function. Mild MR. Normal RV size and function. Normal pulmonary artery pressure.  Recent CV Pertinent Labs: Lab Results  Component Value Date   K 3.7 02/26/2016   K 4.0 11/28/2014   BUN 14 02/26/2016   BUN < 5 (L) 11/28/2014   CREATININE 0.75 02/26/2016   CREATININE 0.67 11/28/2014    Past medical and surgical history were reviewed and updated in EPIC.  No outpatient prescriptions have been marked as taking for the  03/09/17 encounter (Office Visit) with Evonne Rinks, Cristal Deerhristopher, MD.    Allergies: Penicillin g  Social History   Social History  . Marital status: Single    Spouse name: N/A  . Number of children: N/A  . Years of education: N/A   Occupational History  . Not on file.   Social History Main Topics  . Smoking status: Never Smoker  . Smokeless tobacco: Never Used  . Alcohol use No  . Drug use: No  . Sexual activity: Not on file   Other Topics Concern  . Not on file   Social History Narrative  . No narrative on file    Family History  Problem Relation Age of Onset  . Lung cancer Mother     Review of Systems: A 12-system review of systems was performed and was negative except as noted in the HPI.  --------------------------------------------------------------------------------------------------  Physical Exam: BP (!) 90/58 (BP Location: Right Arm, Patient Position: Sitting, Cuff Size: Normal)   Pulse 91   Ht 4\' 9"  (1.448 m)   Wt 61.6 kg (135 lb 12 oz)   BMI 29.38 kg/m   General:  Overweight woman, seated comfortably in the exam room. HEENT: No conjunctival pallor or scleral icterus. Moist mucous membranes.  OP clear. Neck: Supple without lymphadenopathy, thyromegaly, JVD, or HJR. No carotid bruit. Lungs: Normal work of breathing. Coarse breath sounds bilaterally without wheezes or crackles. Heart: Regular rate and rhythm with occasional extrasystoles. No murmurs, rubs, or gallops. Non-displaced PMI. Abd: Bowel sounds present. Soft, NT/ND without hepatosplenomegaly Ext: No lower extremity edema. Radial, PT, and DP pulses are 2+ bilaterally. Skin: Warm and dry without rash.  EKG:  Normal sinus rhythm with single PVC, rightward axis, and left atrial enlargement.  Lab  Results  Component Value Date   WBC 4.4 02/26/2016   HGB 13.9 02/26/2016   HCT 42.3 02/26/2016   MCV 79.4 (L) 02/26/2016   PLT 215 02/26/2016    Lab Results  Component Value Date   NA 137 02/26/2016    K 3.7 02/26/2016   CL 106 02/26/2016   CO2 24 02/26/2016   BUN 14 02/26/2016   CREATININE 0.75 02/26/2016   GLUCOSE 100 (H) 02/26/2016    No results found for: CHOL, HDL, LDLCALC, LDLDIRECT, TRIG, CHOLHDL  --------------------------------------------------------------------------------------------------  ASSESSMENT AND PLAN: Palpitations with frequent PVCs Symptomatically, Ms. Casillas has improved with gradual up titration of metoprolol. Single PVC is noted on her EKG today. We have agreed to repeat a 24-hour Holter monitor to reassess her PVC burden. If this remains significantly elevated, will admit to consider referred her to the EP for further assessment. We may also consider cardiac MRI to evaluate for abnormal delayed hyperenhancement, which can be seen with cardiac sarcoidosis. No evidence of significant conduction abnormalities on EKG today. I will not increase her current metoprolol dose, due to borderline low blood pressure today (asymptomatic).  Follow-up: Return to clinic in 6 months.  Yvonne Kendall, MD 03/09/2017 1:52 PM

## 2017-03-26 ENCOUNTER — Ambulatory Visit (INDEPENDENT_AMBULATORY_CARE_PROVIDER_SITE_OTHER): Payer: Medicaid Other

## 2017-03-26 DIAGNOSIS — I493 Ventricular premature depolarization: Secondary | ICD-10-CM | POA: Diagnosis not present

## 2017-04-05 ENCOUNTER — Ambulatory Visit
Admission: RE | Admit: 2017-04-05 | Discharge: 2017-04-05 | Disposition: A | Discharge status: Home / Self Care | Payer: Medicaid Other | Source: Ambulatory Visit | Attending: Internal Medicine | Admitting: Internal Medicine

## 2017-04-05 DIAGNOSIS — I493 Ventricular premature depolarization: Secondary | ICD-10-CM | POA: Insufficient documentation

## 2017-04-05 DIAGNOSIS — I491 Atrial premature depolarization: Secondary | ICD-10-CM | POA: Insufficient documentation

## 2017-04-09 ENCOUNTER — Telehealth: Payer: Self-pay | Admitting: Internal Medicine

## 2017-04-09 NOTE — Telephone Encounter (Signed)
Pt would like a note stating her lifting restrictions.

## 2017-04-09 NOTE — Telephone Encounter (Signed)
Informed patient she needs to contact pcp in regards to restrictions.

## 2017-04-23 ENCOUNTER — Telehealth: Payer: Self-pay | Admitting: Internal Medicine

## 2017-04-23 NOTE — Telephone Encounter (Signed)
Received records request [employer], forwarded to [facility] for processing.

## 2017-05-28 ENCOUNTER — Telehealth: Payer: Self-pay | Admitting: Internal Medicine

## 2017-05-28 NOTE — Telephone Encounter (Signed)
Looked through papers that pt left and it needs to be done by her PCP due to it is asking about bending, stooping, mental health, etc. Pt informed and papers placed up front for pt to pick up. Nothing further needed.

## 2017-05-28 NOTE — Telephone Encounter (Signed)
Placed in DK's folder to sign. 

## 2017-05-28 NOTE — Telephone Encounter (Signed)
Pt came into office stating pt needed forms filled out for court on 06/10/17   Placed forms in nurse in basket (front desk)

## 2017-08-10 ENCOUNTER — Ambulatory Visit (INDEPENDENT_AMBULATORY_CARE_PROVIDER_SITE_OTHER): Payer: Medicaid Other | Admitting: Internal Medicine

## 2017-08-10 ENCOUNTER — Encounter: Payer: Self-pay | Admitting: Internal Medicine

## 2017-08-10 VITALS — BP 142/88 | HR 75 | Resp 16 | Ht <= 58 in | Wt 145.0 lb

## 2017-08-10 DIAGNOSIS — R635 Abnormal weight gain: Secondary | ICD-10-CM | POA: Diagnosis not present

## 2017-08-10 DIAGNOSIS — D869 Sarcoidosis, unspecified: Secondary | ICD-10-CM | POA: Diagnosis not present

## 2017-08-10 NOTE — Progress Notes (Signed)
[facility]ation      MRN# [id number] Jennifer PulseRenee B [name] 12/01/1970   CC: Chief Complaint  Patient presents with  . Sarcoidosis    Pt here for follow up: she states breathing ok today but she still has occasional cough/sob at night-time.    Brief History: Synopsis: 46 year old female with left upper lobe cavitary lesion, history of bilateral pneumonia, currently being followed by pulmonary and infectious disease. Cavitary lesion differential at this time includes fungal infection versus sarcoidosis. 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  acetaminophen (TYLENOL) 500 MG tablet, Take 500 mg by mouth every 6 (six) hours as needed., Disp: , Rfl:  .  ADVAIR DISKUS 250-50 MCG/DOSE AEPB, Inhale 1 puff into the lungs as directed. (Patient taking differently: Inhale 1 puff into the lungs 2 (two) times daily. ), Disp: 60 each, Rfl: 2 .  albuterol (ACCUNEB) 1.25 MG/3ML nebulizer solution, Take 1 ampule by nebulization every 6 (six) hours as needed for wheezing., Disp: , Rfl:  .  Azelastine-Fluticasone 137-50 MCG/ACT SUSP, Place 1 spray into the nose 2 (two) times daily., Disp: 1 Bottle, Rfl: 0 .  cetirizine (ZYRTEC) 10 MG tablet, Take 1 tablet (10 mg total) by mouth daily., Disp: 30 tablet, Rfl: 3 .  diclofenac (VOLTAREN) 75 MG EC tablet, Take 1 tablet (75 mg total) by mouth 2 (two) times daily., Disp: 30 tablet, Rfl: 0 .  fluticasone (FLONASE)  50 MCG/ACT nasal spray, Place 2 sprays into both nostrils daily., Disp: , Rfl: 0 .  glycerin adult 2 g suppository, Place 1 suppository rectally as needed for constipation., Disp: , Rfl:  .  ibuprofen (ADVIL,MOTRIN) 400 MG tablet, Take 400 mg by mouth every 6 (six) hours as needed., Disp: , Rfl:  .  lactulose (CHRONULAC) 10 GM/15ML solution, Take 10 g by mouth 2 (two) times daily., Disp: , Rfl:  .  metoprolol tartrate (LOPRESSOR) 25 MG tablet, Take 75 mg by mouth 2 (two) times daily., Disp: , Rfl:  .  ondansetron (ZOFRAN) 4 MG tablet, Take 4 mg by mouth every 6 (six) hours as needed., Disp: , Rfl: 0 .  polyethylene glycol powder (GLYCOLAX/MIRALAX) powder, Take 17 g by mouth as needed., Disp: , Rfl: 0 .  predniSONE (DELTASONE) 5 MG tablet, Take 2 tablets (10 mg total) by mouth daily with breakfast., Disp: 60 tablet, Rfl: 2 .  PROAIR HFA 108 (90 BASE) MCG/ACT inhaler, Inhale 108 mcg into the lungs every 6 (six) hours as needed., Disp: , Rfl:  .  Probiotic Product (CULTURELLE IMMUNE DEFENSE PO), Take 1 capsule by mouth daily., Disp: , Rfl:  .  pseudoephedrine (SUDAFED) 60 MG tablet, Take 1 tablet (60 mg total) by mouth every 4 (four) hours as needed for congestion., Disp: 24 tablet, Rfl: 0 .  SAPHRIS 5 MG SUBL 24 hr tablet, Take 5 mg by mouth daily., Disp: , Rfl: 0 .  voriconazole (VFEND) 200 MG tablet, Take 200  mg by mouth 2 (two) times daily., Disp: , Rfl:  .  oxyCODONE-acetaminophen (ROXICET) 5-325 MG tablet, Take 1 tablet by mouth every 6 (six) hours as needed. (Patient not taking: Reported on 08/10/2017), Disp: 20 tablet, Rfl: 0    Review of Systems  Constitutional: Negative for chills, fever and weight loss.  HENT: Negative for ear discharge, ear pain, hearing loss, nosebleeds and tinnitus.   Eyes: Negative for blurred vision and pain.  Respiratory: Negative for cough, sputum production and shortness of breath.   Cardiovascular: Negative for chest pain and palpitations.  Gastrointestinal:  Negative for heartburn, nausea and vomiting.  Skin: Negative for rash.  Neurological: Negative for dizziness and headaches.  Endo/Heme/Allergies: Positive for environmental allergies. Does not bruise/bleed easily.     Review of Systems:  Gen:  Denies  fever, sweats, chills weigh loss  HEENT: Denies blurred vision, double vision, ear pain, eye pain, hearing loss, nose bleeds, sore throat Cardac:  No dizziness, chest pain or heaviness, chest tightness,edema Resp:   -cough + shortness of breath,-wheezing, -hemoptysis,  Other:  All other systems negative   Allergies:  Penicillin g  Physical Examination:  BP (!) 142/88 (BP Location: Left Arm, Cuff Size: Normal)   [name] 75   Resp 16   Ht 4\' 9"  (1.448 m)   Wt 145 lb (65.8 kg)   SpO2 98%   BMI 31.38 kg/m   General Appearance: No distress  HEENT: PERRLA, no ptosis, no other lesions noticed Pulmonary:good respiratory effort, no wheezes, no crackles.  Cardiovascular:  Normal S1,S2.  No m/r/g.     Abdomen:Exam: Benign, Soft, non-tender, No masses  Skin:   warm, no rashes, no ecchymosis  Extremities: normal, no cyanosis, clubbing, warm with normal capillary refill.     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 [facility] advised to cont with Sarcoid tx and restart Voriconazole, will likely need indefinite tx. Plan - ID follow up with Dr. 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   Patient with good  improvement in her respiratory status on chronic prednisone therapy of 15 mg BID She DOES NOT Want to decrease her prednisone dose at this time, she feels her current prednisone dosage is stabilizing her respiratory status I have explained the risks of chronic prednisone therapy and she understands the risks  Patient with possible progression of sarcoid (Fibrocavitary disease and diffuse opacities).   She is fairly stable and prednisone 15 mg.   Plan: - Continue with prednisone 15mg  BID -Vori as per ID -inhalers as prescribed Percocet for pleurisy.   Allergic Rhinitis OTC antihistamines and nasal sprays as needed   Deconditioned state Will place Pulmonary rehab referral Also place nutrition consult  Follow up in 6 months  Patient satisfied with Plan of action and management. All questions answered  Jennifer LeatherKurian David Biridiana [name], M.D.  [facility]  Medical Director [facility] Medical Director [facility]

## 2017-08-10 NOTE — Patient Instructions (Signed)
Place Pulmonary Rehab Referral Place Nutrition Consult

## 2017-08-20 ENCOUNTER — Encounter: Payer: Self-pay | Admitting: *Deleted

## 2017-09-14 ENCOUNTER — Ambulatory Visit: Payer: Self-pay | Admitting: Internal Medicine

## 2017-09-15 ENCOUNTER — Ambulatory Visit: Payer: Medicaid Other | Admitting: Internal Medicine

## 2017-09-15 ENCOUNTER — Encounter: Payer: Self-pay | Admitting: Internal Medicine

## 2017-09-15 VITALS — BP 107/73 | HR 79 | Ht <= 58 in | Wt 144.5 lb

## 2017-09-15 DIAGNOSIS — R0602 Shortness of breath: Secondary | ICD-10-CM | POA: Diagnosis not present

## 2017-09-15 DIAGNOSIS — R0789 Other chest pain: Secondary | ICD-10-CM

## 2017-09-15 DIAGNOSIS — R05 Cough: Secondary | ICD-10-CM

## 2017-09-15 DIAGNOSIS — D869 Sarcoidosis, unspecified: Secondary | ICD-10-CM

## 2017-09-15 DIAGNOSIS — I493 Ventricular premature depolarization: Secondary | ICD-10-CM

## 2017-09-15 DIAGNOSIS — R059 Cough, unspecified: Secondary | ICD-10-CM

## 2017-09-15 NOTE — Progress Notes (Signed)
Follow-up Outpatient Visit Date: 09/15/2017  Primary Care Provider: Judeen HammansSoles, Meredith Key, MD [name] [place] [id number]  Chief Complaint: Cough and shortness of breath  HPI:  Ms. Jennifer [name] is a 47 y.o. year-old female with history of frequent PVCs, sarcoidosis, fibromyalgia, asthma, and anxiety, who presents for follow-up of PVCs.  I last saw her in July, at which time she was doing relatively well with the exception of chronic fatigue.  She was tolerating metoprolol well.  Subsequent Holter monitor showed frequent PVCs, albeit significantly decreased from prior monitor in 06/2016.  Today, Ms. Jennifer [name] is most concerned about nocturnal coughing.  She feels it is worsened by lying flat, prompting her to sleep on 3 pillows most nights.  Her cough is been present ever since she was first diagnosed with sarcoidosis several years ago.  However, she feels as though this may have gotten worse over the last few months.  When she has a coughing spell, she also notes pain in her chest that she describes as a "pulling sensation" across her chest.  It happens most days.  She does not have chest pain at other times, including with exertion.  She notes occasional mild leg edema.  She denies palpitations and lightheadedness.  She has gained about 9 pounds since our last visit in July, which she attributes to a combination of dietary indiscretion and a little bit of fluid retention.  Fatigue continues to be an issue from time to time, not significantly changed from prior visits.  --------------------------------------------------------------------------------------------------   Cardiovascular History & Procedures: Cardiovascular Problems:  Frequent PVCs  Risk Factors:  None  Cath/PCI:  None  CV Surgery:  None  EP Procedures and Devices:  24-hour Holter monitor (03/26/17): Predominantly sinus rhythm with frequent PVCs (6% burden; previously 13%).  No VT or other sustained  arrhythmia.  24-hour Holter monitor (06/29/16): Predominantly sinus rhythm with frequent PVCs and rare PACs. PVC burden was 13%.  Non-Invasive Evaluation(s):  TTE (06/29/16): Normal LV size with LVEF of 50-55%. Normal wall motion and diastolic function. Mild MR. Normal RV size and function. Normal pulmonary artery pressure.  Recent CV Pertinent Labs: Lab Results  Component Value Date   K 3.7 02/26/2016   K 4.0 11/28/2014   BUN 14 02/26/2016   BUN < 5 (L) 11/28/2014   CREATININE 0.75 02/26/2016   CREATININE 0.67 11/28/2014    Past medical and surgical history were reviewed and updated in EPIC.  Current Meds  Medication Sig  . acetaminophen (TYLENOL) 500 MG tablet Take 500 mg by mouth every 6 (six) hours as needed.  Marland Kitchen. ADVAIR DISKUS 250-50 MCG/DOSE AEPB Inhale 1 puff into the lungs as directed. (Patient taking differently: Inhale 1 puff into the lungs 2 (two) times daily. )  . albuterol (ACCUNEB) 1.25 MG/3ML nebulizer solution Take 1 ampule by nebulization every 6 (six) hours as needed for wheezing.  . Azelastine-Fluticasone 137-50 MCG/ACT SUSP Place 1 spray into the nose 2 (two) times daily.  . cetirizine (ZYRTEC) 10 MG tablet Take 1 tablet (10 mg total) by mouth daily.  . diclofenac (VOLTAREN) 75 MG EC tablet Take 1 tablet (75 mg total) by mouth 2 (two) times daily.  . fluticasone (FLONASE) 50 MCG/ACT nasal spray Place 2 sprays into both nostrils daily.  Marland Kitchen. glycerin adult 2 g suppository Place 1 suppository rectally as needed for constipation.  Marland Kitchen. ibuprofen (ADVIL,MOTRIN) 400 MG tablet Take 400 mg by mouth every 6 (six) hours as needed.  . lactulose (CHRONULAC) 10  GM/15ML solution Take 10 g by mouth 2 (two) times daily.  . metoprolol tartrate (LOPRESSOR) 25 MG tablet Take 75 mg by mouth 2 (two) times daily.  . ondansetron (ZOFRAN) 4 MG tablet Take 4 mg by mouth every 6 (six) hours as needed.  Marland Kitchen oxyCODONE-acetaminophen (ROXICET) 5-325 MG tablet Take 1 tablet by mouth every 6 (six)  hours as needed.  . polyethylene glycol powder (GLYCOLAX/MIRALAX) powder Take 17 g by mouth as needed.  . predniSONE (DELTASONE) 5 MG tablet Take 2 tablets (10 mg total) by mouth daily with breakfast.  . PROAIR HFA 108 (90 BASE) MCG/ACT inhaler Inhale 108 mcg into the lungs every 6 (six) hours as needed.  . Probiotic Product (CULTURELLE IMMUNE DEFENSE PO) Take 1 capsule by mouth daily.  . pseudoephedrine (SUDAFED) 60 MG tablet Take 1 tablet (60 mg total) by mouth every 4 (four) hours as needed for congestion.  Marland Kitchen SAPHRIS 5 MG SUBL 24 hr tablet Take 5 mg by mouth daily.  Marland Kitchen voriconazole (VFEND) 200 MG tablet Take 200 mg by mouth 2 (two) times daily.    Allergies: Penicillin g  Social History   Socioeconomic History  . Marital status: Single    Spouse name: Not on file  . Number of children: Not on file  . Years of education: Not on file  . Highest education level: Not on file  Social Needs  . Financial resource strain: Not on file  . Food insecurity - worry: Not on file  . Food insecurity - inability: Not on file  . Transportation needs - medical: Not on file  . Transportation needs - non-medical: Not on file  Occupational History  . Not on file  Tobacco Use  . Smoking status: Never Smoker  . Smokeless tobacco: Never Used  Substance and Sexual Activity  . Alcohol use: No    Alcohol/week: 0.0 oz  . Drug use: No  . Sexual activity: Not on file  Other Topics Concern  . Not on file  Social History Narrative  . Not on file    Family History  Problem Relation Age of Onset  . Lung cancer Mother     Review of Systems: A 12-system review of systems was performed and was negative except as noted in the HPI.  --------------------------------------------------------------------------------------------------  Physical Exam: BP 107/73 (BP Location: Left Arm, Patient Position: Sitting, Cuff Size: Normal)   Pulse 79   Ht 4\' 9"  (1.448 m)   Wt 144 lb 8 oz (65.5 kg)   BMI 31.27  kg/m   General: Obese woman, seated comfortably in the exam room. HEENT: No conjunctival pallor or scleral icterus. Moist mucous membranes.  OP clear. Neck: Supple without lymphadenopathy, thyromegaly, JVD, or HJR. No carotid bruit. Lungs: Normal work of breathing.  Coarse breath sounds without wheezes or crackles. Heart: Regular rate and rhythm with occasional extrasystoles.  No murmurs, rubs, or gallops. Non-displaced PMI. Abd: Bowel sounds present. Soft, NT/ND without hepatosplenomegaly Ext: Trace ankle edema bilaterally. Radial, PT, and DP pulses are 2+ bilaterally. Skin: Warm and dry without rash.  EKG: Normal sinus rhythm with occasional PVCs.  Left atrial enlargement.  Lab Results  Component Value Date   WBC 4.4 02/26/2016   HGB 13.9 02/26/2016   HCT 42.3 02/26/2016   MCV 79.4 (L) 02/26/2016   PLT 215 02/26/2016    Lab Results  Component Value Date   NA 137 02/26/2016   K 3.7 02/26/2016   CL 106 02/26/2016   CO2 24 02/26/2016  BUN 14 02/26/2016   CREATININE 0.75 02/26/2016   GLUCOSE 100 (H) 02/26/2016    No results found for: CHOL, HDL, LDLCALC, LDLDIRECT, TRIG, CHOLHDL  --------------------------------------------------------------------------------------------------  ASSESSMENT AND PLAN: Cough and shortness of breath Symptoms are most likely due to chronic lung changes related to sarcoidosis.  However, given progression over the last few months and associated orthopnea, we have agreed to repeat an echocardiogram to ensure that her previously low normal LVEF has not declined further.  Ms. Johann should follow-up with Dr. Sampson Goon (pulmonary) as previously arranged.  No medication changes at this time.  Atypical chest pain Chest pain is typically associated with coughing and is not exertional.  However, given PVC, it may be worthwhile to consider ischemia evaluation.  If echocardiogram does not show severely reduced LVEF, we will consider a coronary CTA, which  will not only allow [place] to evaluate for CAD but also reassess her parenchymal lung disease.  If significant structural abnormalities are present, cardiac catheterization may be needed instead.  PVCs Frequent on most recent Holter monitor, albeit improved with addition of metoprolol.  Ms. Mcgann is tolerating metoprolol tartrate 75 mg twice daily well.  I am hesitate to increase this further given her low normal blood pressure.  Sarcoidosis Continue current medications and follow-up with Dr. Sampson Goon.  Based on results of echo and ischemia evaluation, cardiac MRI may be needed at some point to evaluate for infiltrative cardiomyopathy.  Follow-up: Return to clinic in 3 months.  Yvonne Kendall, MD 09/15/2017 5:25 PM

## 2017-09-15 NOTE — Patient Instructions (Signed)
Medication Instructions:  Your physician recommends that you continue on your current medications as directed. Please refer to the Current Medication list given to you today.   Labwork: none  Testing/Procedures: Your physician has requested that you have an echocardiogram. Echocardiography is a painless test that uses sound waves to create images of your heart. It provides your doctor with information about the size and shape of your heart and how well your heart's chambers and valves are working. This procedure takes approximately one hour. There are no restrictions for this procedure.    Follow-Up: Your physician recommends that you schedule a follow-up appointment in: 3 MONTHS WITH DR END.   If you need a refill on your cardiac medications before your next appointment, please call your pharmacy.   Echocardiogram An echocardiogram, or echocardiography, uses sound waves (ultrasound) to produce an image of your heart. The echocardiogram is simple, painless, obtained within a short period of time, and offers valuable information to your health care provider. The images from an echocardiogram can provide information such as:  Evidence of coronary artery disease (CAD).  Heart size.  Heart muscle function.  Heart valve function.  Aneurysm detection.  Evidence of a past heart attack.  Fluid buildup around the heart.  Heart muscle thickening.  Assess heart valve function.  Tell a health care provider about:  Any allergies you have.  All medicines you are taking, including vitamins, herbs, eye drops, creams, and over-the-counter medicines.  Any problems you or family members have had with anesthetic medicines.  Any blood disorders you have.  Any surgeries you have had.  Any medical conditions you have.  Whether you are pregnant or may be pregnant. What happens before the procedure? No special preparation is needed. Eat and drink normally. What happens during the  procedure?  In order to produce an image of your heart, gel will be applied to your chest and a wand-like tool (transducer) will be moved over your chest. The gel will help transmit the sound waves from the transducer. The sound waves will harmlessly bounce off your heart to allow the heart images to be captured in real-time motion. These images will then be recorded.  You may need an IV to receive a medicine that improves the quality of the pictures. What happens after the procedure? You may return to your normal schedule including diet, activities, and medicines, unless your health care provider tells you otherwise. This information is not intended to replace advice given to you by your health care provider. Make sure you discuss any questions you have with your health care provider. Document Released: 08/21/2000 Document Revised: 04/11/2016 Document Reviewed: 05/01/2013 Elsevier Interactive Patient Education  2017 [employer]vier Inc.    

## 2017-09-16 NOTE — Addendum Note (Signed)
Addended by: Festus AloeRESPO, Brandelyn Henne G on: 09/16/2017 01:48 PM   Modules accepted: Orders

## 2017-09-20 ENCOUNTER — Other Ambulatory Visit: Payer: Self-pay

## 2017-09-20 ENCOUNTER — Ambulatory Visit (INDEPENDENT_AMBULATORY_CARE_PROVIDER_SITE_OTHER): Payer: Medicaid Other

## 2017-09-20 DIAGNOSIS — R0602 Shortness of breath: Secondary | ICD-10-CM

## 2017-09-20 DIAGNOSIS — I493 Ventricular premature depolarization: Secondary | ICD-10-CM | POA: Diagnosis not present

## 2017-09-20 LAB — ECHOCARDIOGRAM COMPLETE
CHL CUP DOP CALC LVOT VTI: 18.1 cm
CHL CUP MV DEC (S): 161
CHL CUP STROKE VOLUME: 35 mL
E decel time: 161 msec
EERAT: 7.71
FS: 27 % — AB (ref 28–44)
IV/PV OW: 0.75
LA ID, A-P, ES: 27 mm
LA diam end sys: 27 mm
LA diam index: 1.72 cm/m2
LA vol A4C: 31.8 ml
LAVOL: 31.2 mL
LAVOLIN: 19.9 mL/m2
LV E/e'average: 7.71
LV SIMPSON'S DISK: 59
LV dias vol: 59 mL (ref 46–106)
LV sys vol index: 16 mL/m2
LV sys vol: 24 mL (ref 14–42)
LVDIAVOLIN: 38 mL/m2
LVEEMED: 7.71
LVELAT: 10.7 cm/s
LVOT area: 2.54 cm2
LVOT diameter: 18 mm
LVOTSV: 46 mL
Lateral S' vel: 13.2 cm/s
MV pk A vel: 76.2 m/s
MVPG: 3 mmHg
MVPKEVEL: 82.5 m/s
PW: 8 mm — AB (ref 0.6–1.1)
Reg peak vel: 112 cm/s
TAPSE: 20.7 mm
TDI e' lateral: 10.7
TDI e' medial: 11.3
TR max vel: 112 cm/s

## 2017-09-21 ENCOUNTER — Encounter: Payer: Self-pay | Admitting: *Deleted

## 2017-09-21 ENCOUNTER — Other Ambulatory Visit: Payer: Self-pay | Admitting: *Deleted

## 2017-09-21 DIAGNOSIS — Z9189 Other specified personal risk factors, not elsewhere classified: Secondary | ICD-10-CM

## 2017-09-21 DIAGNOSIS — D860 Sarcoidosis of lung: Secondary | ICD-10-CM

## 2017-11-20 ENCOUNTER — Other Ambulatory Visit: Payer: Self-pay

## 2017-11-20 ENCOUNTER — Encounter: Payer: Self-pay | Admitting: Emergency Medicine

## 2017-11-20 DIAGNOSIS — S40861A Insect bite (nonvenomous) of right upper arm, initial encounter: Secondary | ICD-10-CM | POA: Diagnosis not present

## 2017-11-20 DIAGNOSIS — W57XXXA Bitten or stung by nonvenomous insect and other nonvenomous arthropods, initial encounter: Secondary | ICD-10-CM | POA: Diagnosis not present

## 2017-11-20 DIAGNOSIS — T7840XA Allergy, unspecified, initial encounter: Secondary | ICD-10-CM | POA: Diagnosis not present

## 2017-11-20 DIAGNOSIS — Z79899 Other long term (current) drug therapy: Secondary | ICD-10-CM | POA: Insufficient documentation

## 2017-11-20 DIAGNOSIS — J45909 Unspecified asthma, uncomplicated: Secondary | ICD-10-CM | POA: Insufficient documentation

## 2017-11-20 DIAGNOSIS — Y999 Unspecified external cause status: Secondary | ICD-10-CM | POA: Diagnosis not present

## 2017-11-20 DIAGNOSIS — Y929 Unspecified place or not applicable: Secondary | ICD-10-CM | POA: Diagnosis not present

## 2017-11-20 DIAGNOSIS — S40862A Insect bite (nonvenomous) of left upper arm, initial encounter: Secondary | ICD-10-CM | POA: Diagnosis not present

## 2017-11-20 DIAGNOSIS — R21 Rash and other nonspecific skin eruption: Secondary | ICD-10-CM | POA: Diagnosis present

## 2017-11-20 DIAGNOSIS — Y939 Activity, unspecified: Secondary | ICD-10-CM | POA: Diagnosis not present

## 2017-11-20 LAB — LACTIC ACID, PLASMA: Lactic Acid, Venous: 1.1 mmol/L (ref 0.5–1.9)

## 2017-11-20 LAB — CBC WITH DIFFERENTIAL/PLATELET
Basophils Absolute: 0 10*3/uL (ref 0–0.1)
Basophils Relative: 1 %
Eosinophils Absolute: 0.1 10*3/uL (ref 0–0.7)
Eosinophils Relative: 2 %
HEMATOCRIT: 42.4 % (ref 35.0–47.0)
HEMOGLOBIN: 14.2 g/dL (ref 12.0–16.0)
LYMPHS ABS: 1.2 10*3/uL (ref 1.0–3.6)
Lymphocytes Relative: 22 %
MCH: 26.3 pg (ref 26.0–34.0)
MCHC: 33.6 g/dL (ref 32.0–36.0)
MCV: 78.3 fL — ABNORMAL LOW (ref 80.0–100.0)
MONOS PCT: 13 %
Monocytes Absolute: 0.7 10*3/uL (ref 0.2–0.9)
NEUTROS ABS: 3.4 10*3/uL (ref 1.4–6.5)
NEUTROS PCT: 62 %
Platelets: 233 10*3/uL (ref 150–440)
RBC: 5.41 MIL/uL — AB (ref 3.80–5.20)
RDW: 12.6 % (ref 11.5–14.5)
WBC: 5.4 10*3/uL (ref 3.6–11.0)

## 2017-11-20 LAB — BASIC METABOLIC PANEL
Anion gap: 9 (ref 5–15)
BUN: 10 mg/dL (ref 6–20)
CHLORIDE: 105 mmol/L (ref 101–111)
CO2: 23 mmol/L (ref 22–32)
CREATININE: 0.71 mg/dL (ref 0.44–1.00)
Calcium: 9.1 mg/dL (ref 8.9–10.3)
GFR calc non Af Amer: >60 mL/min (ref >60)
Glucose, Bld: 119 mg/dL — ABNORMAL HIGH (ref 65–99)
POTASSIUM: 3.9 mmol/L (ref 3.5–5.1)
Sodium: 137 mmol/L (ref 135–145)

## 2017-11-20 NOTE — ED Triage Notes (Signed)
Pt has site on the back of left arm where she was bitten by an insect last night. Area is red and warm to touch at this time but pt is otherwise in NAD.

## 2017-11-20 NOTE — ED Notes (Signed)
Blood cultures x 1 obtained in triage.

## 2017-11-21 ENCOUNTER — Emergency Department
Admission: EM | Admit: 2017-11-21 | Discharge: 2017-11-21 | Disposition: A | Discharge status: Home / Self Care | Payer: Medicaid Other | Attending: Emergency Medicine | Admitting: Emergency Medicine

## 2017-11-21 DIAGNOSIS — T7840XA Allergy, unspecified, initial encounter: Secondary | ICD-10-CM

## 2017-11-21 DIAGNOSIS — W57XXXA Bitten or stung by nonvenomous insect and other nonvenomous arthropods, initial encounter: Secondary | ICD-10-CM

## 2017-11-21 MED ORDER — PREDNISONE 10 MG PO TABS: 50.0000 mg | ORAL_TABLET | Freq: Every day | ORAL | 0 refills | Status: AC

## 2017-11-21 MED ORDER — DIPHENHYDRAMINE HCL 25 MG PO CAPS
50.0000 mg | ORAL_CAPSULE | Freq: Once | ORAL | Status: AC
  Administered 2017-11-21: 50 mg via ORAL
  Filled 2017-11-21: qty 2

## 2017-11-21 MED ORDER — FAMOTIDINE 20 MG PO TABS
20.0000 mg | ORAL_TABLET | Freq: Once | ORAL | Status: AC
  Administered 2017-11-21: 20 mg via ORAL
  Filled 2017-11-21: qty 1

## 2017-11-21 MED ORDER — PREDNISONE 20 MG PO TABS
60.0000 mg | ORAL_TABLET | Freq: Once | ORAL | Status: AC
  Administered 2017-11-21: 60 mg via ORAL
  Filled 2017-11-21: qty 3

## 2017-11-21 NOTE — ED Provider Notes (Signed)
[facility] Emergency Department Provider Note  ____________________________________________   First MD Initiated Contact with Patient 11/21/17 [phone]     (approximate)  I have reviewed the triage vital signs and the nursing notes.   HISTORY  Chief Complaint Insect Bite   HPI Jennifer [name] is a 47 y.o. female self presents to the emergency department with multiple insect bites.  She has bites to her left and right arms which are intensely itchy and warm.  She is taking no medication at home.  Her symptoms have been insidious onset gradually progressive are now moderate to severe.  She denies fevers or chills.  She denies numbness or weakness.  Past Medical History:  Diagnosis Date  . Anxiety   . Asthma   . Depression   . Fibromyalgia   . Incontinence   . Lymphadenopathy   . Pneumonia    bilateral community acquired   . PVC's (premature ventricular contractions)   . Sarcoidosis   . Sepsis (HCC)   . Shortness of breath dyspnea   . Tachycardia     Patient Active Problem List   Diagnosis Date Noted  . Atypical chest pain 09/15/2017  . PVC (premature ventricular contraction) 09/15/2017  . 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 Pneumonia due to Pseudomonas (HCC) 02/10/2016  . Pulmonary cavitary lesion   . Hilar adenopathy   . Current use of steroid medication 06/17/2015  . Long term current use of systemic steroids 01/03/2015  . Sinus tachycardia 01/03/2015  . Fungal pneumonia 11/23/2014  . Cough 11/22/2014  . SOB (shortness of breath) 11/22/2014  . Cavitary lesion of lung 11/22/2014    Past Surgical History:  Procedure Laterality Date  . CESAREAN SECTION     1992/1994  . ENDOBRONCHIAL ULTRASOUND N/A 01/27/2016   Procedure: ENDOBRONCHIAL ULTRASOUND;  Surgeon: Stephanie AcreVishal Mungal, MD;  Location: [facility] ORS;  Service: Cardiopulmonary;  Laterality: N/A;  . LYMPH NODE BIOPSY    . VIDEO BRONCHOSCOPY N/A 01/27/2016     Procedure: VIDEO BRONCHOSCOPY WITH FLUORO;  Surgeon: Stephanie AcreVishal Mungal, MD;  Location: [facility] ORS;  Service: Cardiopulmonary;  Laterality: N/A;    Prior to Admission medications   Medication Sig Start Date End Date Taking? Authorizing Provider  acetaminophen (TYLENOL) 500 MG tablet Take 500 mg by mouth every 6 (six) hours as needed.    [provider]  ADVAIR DISKUS 250-50 MCG/DOSE AEPB Inhale 1 puff into the lungs as directed. Patient taking differently: Inhale 1 puff into the lungs 2 (two) times daily.  03/27/15   Mungal, Eston EstersVishal, MD  albuterol (ACCUNEB) 1.25 MG/3ML nebulizer solution Take 1 ampule by nebulization every 6 (six) hours as needed for wheezing.    [provider]  Azelastine-Fluticasone 137-50 MCG/ACT SUSP Place 1 spray into the nose 2 (two) times daily. 01/19/17   Erin FullingKasa, Kurian, MD  cetirizine (ZYRTEC) 10 MG tablet Take 1 tablet (10 mg total) by mouth daily. 01/19/17   Erin FullingKasa, Kurian, MD  diclofenac (VOLTAREN) 75 MG EC tablet Take 1 tablet (75 mg total) by mouth 2 (two) times daily. 09/26/15   Vivi BarrackWagoner, Matthew R, DPM  fluticasone (FLONASE) 50 MCG/ACT nasal spray Place 2 sprays into both nostrils daily. 12/04/14   [provider]  glycerin adult 2 g suppository Place 1 suppository rectally as needed for constipation.    [provider]  ibuprofen (ADVIL,MOTRIN) 400 MG tablet Take 400 mg by mouth every 6 (six) hours as needed.    [provider]  lactulose (CHRONULAC) 10 GM/15ML solution Take 10 g by mouth 2 (two) times daily.    [provider]  metoprolol tartrate (LOPRESSOR) 25 MG tablet Take 75 mg by mouth 2 (two) times daily.    [provider]  ondansetron (ZOFRAN) 4 MG tablet Take 4 mg by mouth every 6 (six) hours as needed. 12/04/14   [provider]  polyethylene glycol powder (GLYCOLAX/MIRALAX) powder Take 17 g by mouth as needed. 12/04/14   [provider]  predniSONE (DELTASONE) 10 MG tablet Take 5 tablets  (50 mg total) by mouth daily for 4 days. 11/21/17 11/25/17  Merrily Brittle, MD  predniSONE (DELTASONE) 5 MG tablet Take 2 tablets (10 mg total) by mouth daily with breakfast. 10/22/16   Erin Fulling, MD  PROAIR HFA 108 (90 BASE) MCG/ACT inhaler Inhale 108 mcg into the lungs every 6 (six) hours as needed. 12/05/14   [provider]  Probiotic Product (CULTURELLE IMMUNE DEFENSE PO) Take 1 capsule by mouth daily.    [provider]  pseudoephedrine (SUDAFED) 60 MG tablet Take 1 tablet (60 mg total) by mouth every 4 (four) hours as needed for congestion. 11/28/15   Beers, Charmayne Sheer, PA-C  SAPHRIS 5 MG SUBL 24 hr tablet Take 5 mg by mouth daily. 05/14/17   [provider]  voriconazole (VFEND) 200 MG tablet Take 200 mg by mouth 2 (two) times daily. 02/09/17   [provider]    Allergies Penicillin g  Family History  Problem Relation Age of Onset  . Lung cancer Mother     Social History Social History   Tobacco Use  . Smoking status: Never Smoker  . Smokeless tobacco: Never Used  Substance Use Topics  . Alcohol use: No    Alcohol/week: 0.0 oz  . Drug use: No    Review of Systems Constitutional: No fever/chills Eyes: No visual changes. ENT: No sore throat. Cardiovascular: Denies chest pain. Respiratory: Denies shortness of breath. Gastrointestinal: No abdominal pain.  No nausea, no vomiting.  No diarrhea.  No constipation. Genitourinary: Negative for dysuria. Musculoskeletal: Negative for back pain. Skin: Positive for rash. Neurological: Negative for headaches, focal weakness or numbness.   ____________________________________________   PHYSICAL EXAM:  VITAL SIGNS: ED Triage Vitals  Enc Vitals Group     BP 11/20/17 2134 (!) 106/53     Pulse Rate 11/20/17 2134 70     Resp 11/20/17 2134 20     Temp 11/20/17 2134 99 F (37.2 C)     Temp Source 11/20/17 2134 Oral     SpO2 11/20/17 2134 97 %     Weight 11/20/17 2206 144 lb (65.3 kg)      Height 11/20/17 2206 4\' 9"  (1.448 m)     Head Circumference --      Peak Flow --      Pain Score 11/20/17 2206 8     Pain Loc --      Pain Edu? --      Excl. in GC? --     Constitutional: Alert and oriented x4 appears uncomfortable scratching both upper arms intensely Eyes: PERRL EOMI. Head: Atraumatic. Nose: No congestion/rhinnorhea. Mouth/Throat: No trismus Neck: No stridor.   Cardiovascular: Normal rate, regular rhythm. Grossly normal heart sounds.  Good peripheral circulation. Respiratory: Normal respiratory effort.  No retractions. Lungs CTAB and moving good air Gastrointestinal: Obese soft nontender Musculoskeletal: No lower extremity edema   Neurologic:  Normal speech and language. No gross focal neurologic deficits are appreciated. Skin:  Left lateral upper arm with roughly 6 cm area of erythema and warmth no bulla blister sloughing or tenderness right forearm with similar area with no evidence of necrotizing soft tissue infection Psychiatric: Mood and affect are normal. Speech and behavior are normal.    ____________________________________________   DIFFERENTIAL includes but not limited to  Insect bite, cellulitis, abscess, necrotizing soft tissue infection ____________________________________________   LABS (all labs ordered are listed, but only abnormal results are displayed)  Labs Reviewed  CBC WITH DIFFERENTIAL/PLATELET - Abnormal; Notable for the following components:      Result Value   RBC 5.41 (*)    MCV 78.3 (*)    All other components within normal limits  BASIC METABOLIC PANEL - Abnormal; Notable for the following components:   Glucose, Bld 119 (*)    All other components within normal limits  CULTURE, BLOOD (ROUTINE X 2)  LACTIC ACID, PLASMA    Lab work reviewed by me with no acute  disease __________________________________________  EKG   ____________________________________________  RADIOLOGY   ____________________________________________   PROCEDURES  Procedure(s) performed: no  Procedures  Critical Care performed: no  Observation: no ____________________________________________   INITIAL IMPRESSION / ASSESSMENT AND PLAN / ED COURSE  Pertinent labs & imaging results that were available during my care of the patient were reviewed by me and considered in my medical decision making (see chart for details).  The patient arrives uncomfortable appearing with an intensely itchy rash on bilateral upper extremities consistent with insect bite.  Given Pepcid and Benadryl here today with near immediate improvement in her symptoms.  I do think is reasonable to give her a short course of prednisone to prevent rebound.  Doubt superinfection at this time.  Strict return precautions given and the patient verbalizes understanding and agreement with plan.      ____________________________________________   FINAL CLINICAL IMPRESSION(S) / ED DIAGNOSES  Final diagnoses:  Insect bite, initial encounter  Allergic reaction, initial encounter      NEW MEDICATIONS STARTED DURING THIS VISIT:  Discharge Medication List as of 11/21/2017  3:39 AM    START taking these medications   Details  !! predniSONE (DELTASONE) 10 MG tablet Take 5 tablets (50 mg total) by mouth daily for 4 days., Starting Sun 11/21/2017, Until Thu 11/25/2017, Print     !! - Potential duplicate medications found. Please discuss with provider.       Note:  This document was prepared using Dragon voice recognition software and may include unintentional dictation errors.     Merrily Brittle, MD 11/23/17 2239

## 2017-11-21 NOTE — Discharge Instructions (Signed)
Please use Benadryl and Pepcid as needed for itchy symptoms and take all of your steroids as prescribed.  Follow-up with your primary care physician on Monday if your symptoms have not resolved.  Return to the emergency department sooner for any new or worsening symptoms such as fevers, chills, worsening pain, or for any other issues whatsoever.  It was a pleasure to take care of you today, and thank you for coming to our emergency department.  If you have any questions or concerns before leaving please ask the nurse to grab me and I'm more than happy to go through your aftercare instructions again.  If you were prescribed any opioid pain medication today such as Norco, Vicodin, Percocet, morphine, hydrocodone, or oxycodone please make sure you do not drive when you are taking this medication as it can alter your ability to drive safely.  If you have any concerns once you are home that you are not improving or are in fact getting worse before you can make it to your follow-up appointment, please do not hesitate to call 911 and come back for further evaluation.  Merrily BrittleNeil Kazue Cerro, MD  Results for orders placed or performed during the hospital encounter of 11/21/17  Lactic acid, plasma  Result Value Ref Range   Lactic Acid, Venous 1.1 0.5 - 1.9 mmol/L  CBC with Differential  Result Value Ref Range   WBC 5.4 3.6 - 11.0 K/uL   RBC 5.41 (H) 3.80 - 5.20 MIL/uL   Hemoglobin 14.2 12.0 - 16.0 g/dL   HCT 09.842.4 11.935.0 - 14.747.0 %   MCV 78.3 (L) 80.0 - 100.0 fL   MCH 26.3 26.0 - 34.0 pg   MCHC 33.6 32.0 - 36.0 g/dL   RDW 82.912.6 56.211.5 - 13.014.5 %   Platelets 233 150 - 440 K/uL   Neutrophils Relative % 62 %   Neutro Abs 3.4 1.4 - 6.5 K/uL   Lymphocytes Relative 22 %   Lymphs Abs 1.2 1.0 - 3.6 K/uL   Monocytes Relative 13 %   Monocytes Absolute 0.7 0.2 - 0.9 K/uL   Eosinophils Relative 2 %   Eosinophils Absolute 0.1 0 - 0.7 K/uL   Basophils Relative 1 %   Basophils Absolute 0.0 0 - 0.1 K/uL  Basic metabolic  panel  Result Value Ref Range   Sodium 137 135 - 145 mmol/L   Potassium 3.9 3.5 - 5.1 mmol/L   Chloride 105 101 - 111 mmol/L   CO2 23 22 - 32 mmol/L   Glucose, Bld 119 (H) 65 - 99 mg/dL   BUN 10 6 - 20 mg/dL   Creatinine, Ser 8.650.71 0.44 - 1.00 mg/dL   Calcium 9.1 8.9 - 78.410.3 mg/dL   GFR calc non Af Amer >60 >60 mL/min   GFR calc Af Amer >60 >60 mL/min   Anion gap 9 5 - 15

## 2017-11-21 NOTE — ED Notes (Signed)
Pt reports she "got eaten up last night". Family at bedside. Dr. Lamont Snowballifenbark at bedside. Pt has bite on her left arm that is red, swollen, and itchy.

## 2017-11-25 LAB — CULTURE, BLOOD (ROUTINE X 2)
Culture: NO GROWTH
SPECIAL REQUESTS: ADEQUATE

## 2017-12-20 ENCOUNTER — Telehealth: Payer: Self-pay | Admitting: *Deleted

## 2017-12-20 NOTE — Telephone Encounter (Signed)
Jennifer [name], Jennifer [name], Jennifer Aube O, RN        10-01-17 [facility] to call and schedule/saf  10-11-17 [facility] to call and schedule ct/saf  11-08-17 [facility] to call and schedule ct/saf    [facility] 3 attempts made to schedule Coronary CT and no call back from patient.

## 2017-12-24 ENCOUNTER — Encounter: Payer: Medicaid Other | Attending: Family Medicine | Admitting: Dietician

## 2017-12-24 ENCOUNTER — Encounter: Payer: Self-pay | Admitting: Internal Medicine

## 2017-12-24 ENCOUNTER — Encounter: Payer: Self-pay | Admitting: Dietician

## 2017-12-24 VITALS — Ht <= 58 in | Wt 136.4 lb

## 2017-12-24 DIAGNOSIS — R635 Abnormal weight gain: Secondary | ICD-10-CM

## 2017-12-24 NOTE — Patient Instructions (Signed)
   Eat small meals and snacks every 3-4 hours during the day.   Keep portions of starchy foods to 1/2-2/3 cup, less than the size of a fisted hand. If you have dessert, keep other starch at your meal to either 1 piece of bread or 1/3 - 1/2 cup.   Limit dessert portions and try to have dessert less often. Substitute fruit instead of something sweet. OK to have some sugar free jello with fruit and/or light cool whip.   Do some light exercise like walking that doesn't stress breathing or asthma

## 2017-12-24 NOTE — Progress Notes (Signed)
Medical Nutrition Therapy: Visit start time: 0258  end time: 5277  Assessment:  Diagnosis: weight gain Past medical history: GERD, sarcoidosis, HLD Psychosocial issues/ stress concerns: unknown Preferred learning method:  . Auditory   Current weight: 136.4lbs Height: 4'9" Medications, supplements: reconciled list in medical record  Progress and evaluation: Patient reports no diet changes recently, but has lost some weight-- weight tends to fluctuate. Reports symptoms of reflux even with water at times, and decreased appetite. Increased allergy symptoms and congestion currently which aggravates GERD. She is often unable to eat early mornings, and/or at lunch, but generally able to eat supper.  She is limiting sodium intake; uses air fryer, crock pot, and convection oven for cooking; also bakes cakes, cookies for desserts. Drinks some regular soda and states she does not like any diet sodas.    Physical activity: none  Dietary Intake:  Usual eating pattern includes 2 meals and 1-2 snacks per day. Dining out frequency: 0-1 meals per week.  Breakfast: drinks hot tea (to calm coughing); waits 1-2 hours before eating to prevent nausea, eats egg and grits Snack: unable to eat much other than small portions chips kettle cooked Lunch: often unable to eat due to allergies; trying to find light foods during allergy season Snack: none or same as am Supper: best meal of the day. 4/18 chicken wings in crock pot with mac and cheese (small portion due to milk); uses air fryer ham burger or hot dog or chicken nuggets.  Snack: if little or no meals, will eat a small portion of cooked food Beverages: water, regular soda, juice, hot tea (peach, mixed fruit) with stevia  Nutrition Care Education: Topics covered: GERD, weight control Basic nutrition: basic food groups, appropriate nutrient balance, appropriate meal and snack schedule, Weight control: appropriate nutrient balance and emphasis on vegetables  and fruits with controlled portions of starches, and inclusion of protein source with meals; importance of limiting added fat and sugar in foods.  GERD: eating small amounts of food often during the day; advised small portion of starch with protein; avoidance of dairy foods as patient reports sensitivity;  Other: anti-inflammatory food choices; answered multiple questions from patient regarding specific food choices.   Nutritional Diagnosis:  [place]-3.4 Unintentional weight gain As related to erratic eating pattern, excess calories, inactivity.  As evidenced by patient with gain of 8-10lbs in past 1-2 months per MD and patient reports.  Intervention: Instruction as noted above.   Set goals with input from patient.    She will work on better portion control of starchy foods and sweets.   Education Materials given:  Marland Kitchen GERD nutrition therapy (AND) . Plate Planner with food lists . Goals/ instructions  Learner/ who was taught:  . Patient   Level of understanding: . Partial understanding; needs review/ practice  Demonstrated degree of understanding via:   Teach back Learning barriers: . None  Willingness to learn/ readiness for change: . Acceptance, ready for change  Monitoring and Evaluation:  Dietary intake, exercise, GERD symptoms, and body weight      follow up: 01/21/18

## 2018-01-21 ENCOUNTER — Encounter: Payer: Self-pay | Admitting: Dietician

## 2018-01-21 ENCOUNTER — Encounter: Payer: Medicaid Other | Attending: Family Medicine | Admitting: Dietician

## 2018-01-21 VITALS — Ht <= 58 in | Wt 133.8 lb

## 2018-01-21 DIAGNOSIS — R635 Abnormal weight gain: Secondary | ICD-10-CM | POA: Insufficient documentation

## 2018-01-21 NOTE — Progress Notes (Signed)
Medical Nutrition Therapy: Visit start time: 1130  end time: 1225  Assessment:  Diagnosis: weight gain Medical history changes: no changes per patient Psychosocial issues/ stress concerns: none  Current weight: 133.8lbs Height: 4'9" Medications, supplement changes: no changes per patient  Progress and evaluation: Patient has lost 2.6lbs since previous visit on 12/24/17. She reports ongoing issues with nasal and sinus drainage that has worsened GERD symptoms and kept her appetite poor. She states she is trying to eat regularly but is only able to eat small amounts of food at a time; she feels like food gets stuck and is slow to travel to her stomach.   Physical activity: none recently due to respiratory congestion and fatigue  Dietary Intake:  Usual eating pattern includes 2-3 meals and 0-1 snacks per day. Dining out frequency: not assessed today.  Breakfast: water (no tea recently); light meal about 2hrs after waking up-- soup last week; egg sandwich Snack: occasional small portions of foods similar to other meals Lunch: baked chicken, greens (didn't settle well); chicken pasta, spaghetti Snack: none Supper: similar foods as lunch; rice with chicken neck able to eat small amount only Snack: none Beverages: water, occasional coffee  Nutrition Care Education: Topics covered: GERD Basic nutrition: appropriate meal and snack schedule    GERD and GI symptoms: Reviewed importance of eating at regular intervals throughout the day, with small portions at a time; discussed soft, easy-to-chew and digest food options including protein sources; discussed limiting to these options until respiratory congestion improves, then gradually increasing fiber and food variety  Nutritional Diagnosis:  [place]-3.4 Unintentional weight gain As related to history of excess calories and inactivity.  As evidenced by patient with prior weight gain of 8-10lbs, now with poor appetite due to respiratory and GI  symptoms.  Intervention: Discussion as noted above.   Updated goals with input from patient.   Patient declined additional RD follow-up at this time, but will schedule later if needed.  Education Materials given:  Marland Kitchen Goals/ instructions . Other: [place] resources for asthma, per patient request  Learner/ who was taught:  . Patient   Level of understanding: Marland Kitchen Verbalizes/ demonstrates competency   Demonstrated degree of understanding via:   Teach back Learning barriers: . None  Willingness to learn/ readiness for change: . Eager, change in progress   Monitoring and Evaluation:  Dietary intake, exercise, GI symptoms, and body weight      follow up: prn

## 2018-01-21 NOTE — Patient Instructions (Signed)
   Eat foods that are soft and easy to chew. Cottage cheese, eggs, canned tuna or chicken, smoothie with added protein (check label for protein), Pediasure Sidekicks would be ok if you want to try pediasure.   Avoid tough skins and peels on vegetables and fruits such as apples -- peel the fruit first, and cut into small pieces to make chewing and digesting easier.   Try adding cooked pieces of chicken into a can of cream of chicken or celery soup, warm it up and serve it over rice.   Try to eat small amounts of food every 2-4 hours during the day.

## 2018-02-16 ENCOUNTER — Other Ambulatory Visit: Payer: Self-pay | Admitting: Internal Medicine

## 2018-02-21 ENCOUNTER — Ambulatory Visit (INDEPENDENT_AMBULATORY_CARE_PROVIDER_SITE_OTHER): Payer: Medicaid Other | Admitting: Internal Medicine

## 2018-02-21 ENCOUNTER — Encounter: Payer: Self-pay | Admitting: Internal Medicine

## 2018-02-21 VITALS — BP 122/64 | HR 81 | Ht <= 58 in | Wt 129.0 lb

## 2018-02-21 DIAGNOSIS — D869 Sarcoidosis, unspecified: Secondary | ICD-10-CM

## 2018-02-21 DIAGNOSIS — K219 Gastro-esophageal reflux disease without esophagitis: Secondary | ICD-10-CM

## 2018-02-21 NOTE — Progress Notes (Signed)
[facility] Consultation      MRN# [id number] Jennifer PulseRenee B Luger 04/09/1971   CC: Chief Complaint  Patient presents with  . Follow-up    SOB w/activity: cough    Brief History: Synopsis: 47 year old female with left upper lobe cavitary lesion, history of bilateral pneumonia, currently being followed by pulmonary and infectious disease. Cavitary lesion differential at this time includes fungal infection versus sarcoidosis. 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  acetaminophen (TYLENOL) 500 MG tablet, Take 500 mg by mouth every 6 (six) hours as needed., Disp: , Rfl:  .  ADVAIR DISKUS 250-50 MCG/DOSE AEPB, Inhale 1 puff into the lungs as directed. (Patient taking differently: Inhale 1 puff into the lungs 2 (two) times daily. ), Disp: 60 each, Rfl: 2 .  albuterol (ACCUNEB) 1.25 MG/3ML nebulizer solution, Take 1 ampule by nebulization every 6 (six) hours as needed for wheezing., Disp: , Rfl:  .  Azelastine-Fluticasone 137-50 MCG/ACT SUSP, Place 1 spray into the nose 2 (two) times daily., Disp: 1 Bottle, Rfl: 0 .  cetirizine (ZYRTEC) 10 MG tablet, Take 1 tablet (10 mg total) by mouth daily., Disp: 30 tablet, Rfl: 3 .  diclofenac (VOLTAREN) 75 MG EC tablet, Take 1 tablet (75 mg total) by mouth 2 (two) times daily., Disp: 30 tablet, Rfl: 0 .  fluticasone (FLONASE) 50 MCG/ACT  nasal spray, Place 2 sprays into both nostrils daily., Disp: , Rfl: 0 .  glycerin adult 2 g suppository, Place 1 suppository rectally as needed for constipation., Disp: , Rfl:  .  ibuprofen (ADVIL,MOTRIN) 400 MG tablet, Take 400 mg by mouth every 6 (six) hours as needed., Disp: , Rfl:  .  lactulose (CHRONULAC) 10 GM/15ML solution, Take 10 g by mouth 2 (two) times daily., Disp: , Rfl:  .  metoprolol tartrate (LOPRESSOR) 25 MG tablet, Take 75 mg by mouth 2 (two) times daily., Disp: , Rfl:  .  ondansetron (ZOFRAN) 4 MG tablet, Take 4 mg by mouth every 6 (six) hours as needed., Disp: , Rfl: 0 .  polyethylene glycol powder (GLYCOLAX/MIRALAX) powder, Take 17 g by mouth as needed., Disp: , Rfl: 0 .  predniSONE (DELTASONE) 5 MG tablet, take 2 tablets by mouth once daily with BREAKFAST, Disp: 60 tablet, Rfl: 1 .  PROAIR HFA 108 (90 BASE) MCG/ACT inhaler, Inhale 108 mcg into the lungs every 6 (six) hours as needed., Disp: , Rfl:  .  Probiotic Product (CULTURELLE IMMUNE DEFENSE PO), Take 1 capsule by mouth daily., Disp: , Rfl:  .  pseudoephedrine (SUDAFED) 60 MG tablet, Take 1 tablet (60 mg total) by mouth every 4 (four) hours as needed for congestion., Disp: 24 tablet, Rfl: 0 .  ranitidine (ZANTAC) 75 MG tablet, Take 75 mg by mouth 2 (two) times daily., Disp: , Rfl:  .  SAPHRIS 5 MG SUBL 24 hr tablet, Take 5  mg by mouth daily., Disp: , Rfl: 0 .  Vitamin D, Cholecalciferol, 1000 units TABS, Take by mouth., Disp: , Rfl:  .  voriconazole (VFEND) 200 MG tablet, Take 200 mg by mouth 2 (two) times daily., Disp: , Rfl:     Review of Systems  Constitutional: Negative for chills, fever and weight loss.  HENT: Negative for ear discharge, ear pain, hearing loss, nosebleeds and tinnitus.   Eyes: Negative for blurred vision and pain.  Respiratory: Negative for cough, sputum production and shortness of breath.   Cardiovascular: Negative for chest pain and palpitations.  Gastrointestinal: Negative for heartburn,  nausea and vomiting.  Skin: Negative for rash.  Neurological: Negative for dizziness and headaches.  Endo/Heme/Allergies: Positive for environmental allergies. Does not bruise/bleed easily.   Review of Systems:  Gen:  Denies  fever, sweats, chills weigh loss  HEENT: Denies blurred vision, double vision, ear pain, eye pain, hearing loss, nose bleeds, sore throat Cardac:  No dizziness, chest pain or heaviness, chest tightness,edema Resp:   -cough + shortness of breath,-wheezing, -hemoptysis,  Other:  All other systems negative     Allergies:  Penicillin g  Physical Examination:  Ht 4\' 9"  (1.448 m)   Wt 129 lb (58.5 kg)   BMI 27.92 kg/m   BP 122/64 (BP Location: Left Arm, Cuff Size: Normal)   [name] 81   Ht 4\' 9"  (1.448 m)   Wt 129 lb (58.5 kg)   SpO2 94%   BMI 27.92 kg/m  General Appearance: No distress  HEENT: PERRLA, no ptosis, no other lesions noticed Pulmonary:good respiratory effort, no wheezes, no crackles.  Cardiovascular:  Normal S1,S2.  No m/r/g.     Abdomen:Exam: Benign, Soft, non-tender, No masses  Skin:   warm, no rashes, no ecchymosis  Extremities: normal, no cyanosis, clubbing, warm with normal capillary refill.       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 Seen by [facility], advised to cont with Sarcoid tx and restart Voriconazole, will likely need indefinite tx. Plan - ID follow up with Dr. 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   Patient with good improvement in her respiratory status on chronic prednisone therapy of 5 mg daily She DOES NOT Want to decrease her prednisone dose at this time, she feels her current prednisone dosage is stabilizing her respiratory status I have explained the risks of chronic prednisone therapy and she understands the risks  Patient with possible progression of sarcoid (Fibrocavitary disease and diffuse opacities).   She is fairly stable and prednisone 5 mg   Plan: - Continue with prednisone 5 mg daily -Vori as per ID -inhalers as prescribed Percocet for pleurisy.   Allergic Rhinitis OTC antihistamines and nasal sprays as needed   UNCONTROLLED GERD Needs GI referral  Deconditioned state Will place Pulmonary rehab referral again Also place nutrition consult  Follow up in 6 months  Patient satisfied with Plan of action and management. All questions answered  [name], M.D.  [facility]  Medical  Director [place] Director [facility] Cardio-Pulmonary Department

## 2018-02-21 NOTE — Patient Instructions (Signed)
Pulmonary and GI referral

## 2018-03-09 ENCOUNTER — Encounter: Payer: Self-pay | Admitting: Internal Medicine

## 2018-03-09 ENCOUNTER — Ambulatory Visit: Payer: Medicaid Other | Admitting: Internal Medicine

## 2018-03-09 VITALS — BP 128/74 | HR 111 | Ht <= 58 in | Wt 128.0 lb

## 2018-03-09 DIAGNOSIS — R0602 Shortness of breath: Secondary | ICD-10-CM | POA: Diagnosis not present

## 2018-03-09 DIAGNOSIS — D869 Sarcoidosis, unspecified: Secondary | ICD-10-CM

## 2018-03-09 DIAGNOSIS — I493 Ventricular premature depolarization: Secondary | ICD-10-CM | POA: Diagnosis not present

## 2018-03-09 DIAGNOSIS — R0789 Other chest pain: Secondary | ICD-10-CM

## 2018-03-09 MED ORDER — METOPROLOL TARTRATE 100 MG PO TABS: 100.0000 mg | ORAL_TABLET | Freq: Two times a day (BID) | ORAL | 1 refills | Status: DC

## 2018-03-09 NOTE — Patient Instructions (Addendum)
Medication Instructions: INCREASE the Metoprolol to 100 mg twice daily.  If you need a refill on your cardiac medications before your next appointment, please call your pharmacy.   Labs: Please have the following labs drawn one week prior to your scheduled cardiac ct: BMET and TSH.  Follow-Up: Your physician wants you to follow-up in 3 months with Dr. Okey DupreEnd or an APP.    [facility]!

## 2018-03-09 NOTE — Progress Notes (Signed)
Follow-up Outpatient Visit Date: 03/09/2018  Primary Care Provider: Judeen Hammans, MD [address] [place] [address]  Chief Complaint: Shortness of breath  HPI:  Jennifer [name] is a 47 y.o. year-old female with history of frequent PVCs, sarcoidosis, fibromyalgia, asthma, and anxiety, who presents for follow-up of PVCs.  I last saw her in January, at which time she was concerned about nocturnal coughing and orthopnea.  She noted that cough has been present ever since her diagnosis of sarcoidosis was made several years ago.  She endorsed a "pulling sensation" across her chest after coughing.  Subsequent echocardiogram was unremarkable.  Cardiac CTA was ordered to exclude underlying coronary disease, though this has not yet been performed.  Today, [name] reports that she continues to have intermittent chest pain that she describes as "getting hit in the chest for a second."  Pain most often occurs when she coughs.  She has stable dyspnea on exertion as well as 3-pillow orthopnea and occasional PND.  She feels like her breathing at night has gotten slightly worse over the last 6 months.  She wonders if this could be related to acid reflux.  She denies palpitations and lightheadedness, though she reports a long history of dizziness (feeling off balance) when she is outside in the heat.  --------------------------------------------------------------------------------------------------  Cardiovascular History & Procedures: Cardiovascular Problems:  Frequent PVCs  Atypical chest pain and shortness of breath (history of pulmonary sarcoidosis)  Risk Factors:  None  Cath/PCI:  None  CV Surgery:  None  EP Procedures and Devices:  24-hour Holter monitor (03/26/17): Predominantly sinus rhythm with frequent PVCs (6% burden; previously 13%).  No VT or other sustained arrhythmia.  24-hour Holter monitor (06/29/16): Predominantly sinus rhythm with frequent PVCs and  rare PACs. PVC burden was 13%.  Non-Invasive Evaluation(s):  TTE (09/20/2017): Normal LV size and wall thickness.  LVEF 55 to 60% with normal wall motion and diastolic function.  Normal RV size and function.  Normal PA pressure.  TTE (06/29/16): Normal LV size with LVEF of 50-55%. Normal wall motion and diastolic function. Mild MR. Normal RV size and function. Normal pulmonary artery pressure.   Recent CV Pertinent Labs: Lab Results  Component Value Date   K 3.9 11/20/2017   K 4.0 11/28/2014   BUN 10 11/20/2017   BUN < 5 (L) 11/28/2014   CREATININE 0.71 11/20/2017   CREATININE 0.67 11/28/2014    Past medical and surgical history were reviewed and updated in EPIC.  Current Meds  Medication Sig  . acetaminophen (TYLENOL) 500 MG tablet Take 500 mg by mouth every 6 (six) hours as needed.  Marland Kitchen ADVAIR DISKUS 250-50 MCG/DOSE AEPB Inhale 1 puff into the lungs as directed. (Patient taking differently: Inhale 1 puff into the lungs 2 (two) times daily. )  . albuterol (ACCUNEB) 1.25 MG/3ML nebulizer solution Take 1 ampule by nebulization every 6 (six) hours as needed for wheezing.  . Azelastine-Fluticasone 137-50 MCG/ACT SUSP Place 1 spray into the nose 2 (two) times daily.  . cetirizine (ZYRTEC) 10 MG tablet Take 1 tablet (10 mg total) by mouth daily.  . diclofenac (VOLTAREN) 75 MG EC tablet Take 1 tablet (75 mg total) by mouth 2 (two) times daily.  . fluticasone (FLONASE) 50 MCG/ACT nasal spray Place 2 sprays into both nostrils daily.  Marland Kitchen glycerin adult 2 g suppository Place 1 suppository rectally as needed for constipation.  Marland Kitchen ibuprofen (ADVIL,MOTRIN) 400 MG tablet Take 400 mg by mouth every 6 (six) hours  as needed.  . lactulose (CHRONULAC) 10 GM/15ML solution Take 10 g by mouth 2 (two) times daily.  . metoprolol tartrate (LOPRESSOR) 25 MG tablet Take 75 mg by mouth 2 (two) times daily.  . ondansetron (ZOFRAN) 4 MG tablet Take 4 mg by mouth every 6 (six) hours as needed.  . polyethylene  glycol powder (GLYCOLAX/MIRALAX) powder Take 17 g by mouth as needed.  . predniSONE (DELTASONE) 5 MG tablet take 2 tablets by mouth once daily with BREAKFAST  . PROAIR HFA 108 (90 BASE) MCG/ACT inhaler Inhale 108 mcg into the lungs every 6 (six) hours as needed.  . Probiotic Product (CULTURELLE IMMUNE DEFENSE PO) Take 1 capsule by mouth daily.  . pseudoephedrine (SUDAFED) 60 MG tablet Take 1 tablet (60 mg total) by mouth every 4 (four) hours as needed for congestion.  . ranitidine (ZANTAC) 75 MG tablet Take 75 mg by mouth 2 (two) times daily.  Marland Kitchen. SAPHRIS 5 MG SUBL 24 hr tablet Take 5 mg by mouth daily.  . Vitamin D, Cholecalciferol, 1000 units TABS Take 500 Units by mouth daily.   Marland Kitchen. voriconazole (VFEND) 200 MG tablet Take 200 mg by mouth 2 (two) times daily.    Allergies: Penicillin g  Social History   Tobacco Use  . Smoking status: Never Smoker  . Smokeless tobacco: Never Used  Substance Use Topics  . Alcohol use: No    Alcohol/week: 0.0 oz  . Drug use: No    Family History  Problem Relation Age of Onset  . Lung cancer Mother     Review of Systems: A 12-system review of systems was performed and was negative except as noted in the HPI.  --------------------------------------------------------------------------------------------------  Physical Exam: BP 128/74 (BP Location: Left Arm, Patient Position: Sitting, Cuff Size: Normal)   Pulse (!) 111   Ht 4\' 9"  (1.448 m)   Wt 128 lb (58.1 kg)   BMI 27.70 kg/m   General:  NAD HEENT: No conjunctival pallor or scleral icterus. Moist mucous membranes.  OP clear. Neck: Supple without lymphadenopathy, thyromegaly, JVD, or HJR. No carotid bruit. Lungs: Normal work of breathing. Coarse breath sounds bilaterally without wheezes or crackles. Heart: Tachycardic but regular with occasional extrasystoles.  No murmurs or rubs. Non-displaced PMI. Abd: Bowel sounds present. Soft, NT/ND without hepatosplenomegaly Ext: No lower extremity  edema. Radial, PT, and DP pulses are 2+ bilaterally. Skin: Warm and dry without rash.  EKG:  Sinus tachycardia with frequent PVC's.  Left atrial enlargement.  Lab Results  Component Value Date   WBC 5.4 11/20/2017   HGB 14.2 11/20/2017   HCT 42.4 11/20/2017   MCV 78.3 (L) 11/20/2017   PLT 233 11/20/2017    Lab Results  Component Value Date   NA 137 11/20/2017   K 3.9 11/20/2017   CL 105 11/20/2017   CO2 23 11/20/2017   BUN 10 11/20/2017   CREATININE 0.71 11/20/2017   GLUCOSE 119 (H) 11/20/2017    No results found for: CHOL, HDL, LDLCALC, LDLDIRECT, TRIG, CHOLHDL  --------------------------------------------------------------------------------------------------  ASSESSMENT AND PLAN: Atypical chest pain and shortness of breath Symptoms are mostly present after coughing and last only a second, uncharacteristic for angina.  However, given accompanying shortness of breath, we have agreed to proceed with cardiac CTA to exclude significant CAD as well as to reassess her parenchymal lung disease.  We will check a BMP to assess renal function in preparation of CTA.  PVC's [name] continues to have frequent PVC's on EKG today and is also  noted to have underlying sinus tachycardia.  Given weight loss over the last 6 months and sinus tachycardia, we will obtain a TSH.  I will also increase metoprolol tartrate to 100 mg BID.  Sarcoidosis Continue management per pulmonary.  May need to consider cardiac MRI if symptoms or PVC burden worsen to exclude infiltrative cardiomyopathy.  Follow-up: Return to clinic in 3 months.  Yvonne Kendall, MD 03/09/2018 2:14 PM

## 2018-03-21 ENCOUNTER — Encounter: Payer: Medicaid Other | Attending: Family Medicine

## 2018-03-21 ENCOUNTER — Other Ambulatory Visit: Payer: Self-pay

## 2018-03-21 VITALS — Ht 59.4 in | Wt 125.2 lb

## 2018-03-21 DIAGNOSIS — D869 Sarcoidosis, unspecified: Secondary | ICD-10-CM

## 2018-03-21 DIAGNOSIS — R635 Abnormal weight gain: Secondary | ICD-10-CM | POA: Diagnosis present

## 2018-03-21 NOTE — Progress Notes (Signed)
Pulmonary Individual Treatment Plan  Patient Details  Name: Jennifer [name] MRN: [id number] Date of Birth: 07-12-1971 Referring Provider:     Pulmonary Rehab from 03/21/2018 in [facility] Cardiac and Pulmonary Rehab  Referring Provider  Flora Lipps MD      Initial Encounter Date:    Pulmonary Rehab from 03/21/2018 in [facility] Cardiac and Pulmonary Rehab  Date  03/21/18      Visit Diagnosis: Sarcoidosis  Patient's Home Medications on Admission:  Current Outpatient Medications:  .  acetaminophen (TYLENOL) 500 MG tablet, Take 500 mg by mouth every 6 (six) hours as needed., Disp: , Rfl:  .  ADVAIR DISKUS 250-50 MCG/DOSE AEPB, Inhale 1 puff into the lungs as directed. (Patient taking differently: Inhale 1 puff into the lungs 2 (two) times daily. ), Disp: 60 each, Rfl: 2 .  albuterol (ACCUNEB) 1.25 MG/3ML nebulizer solution, Take 1 ampule by nebulization every 6 (six) hours as needed for wheezing., Disp: , Rfl:  .  Azelastine-Fluticasone 137-50 MCG/ACT SUSP, Place 1 spray into the nose 2 (two) times daily., Disp: 1 Bottle, Rfl: 0 .  cetirizine (ZYRTEC) 10 MG tablet, Take 1 tablet (10 mg total) by mouth daily., Disp: 30 tablet, Rfl: 3 .  diclofenac (VOLTAREN) 75 MG EC tablet, Take 1 tablet (75 mg total) by mouth 2 (two) times daily., Disp: 30 tablet, Rfl: 0 .  fluticasone (FLONASE) 50 MCG/ACT nasal spray, Place 2 sprays into both nostrils daily., Disp: , Rfl: 0 .  glycerin adult 2 g suppository, Place 1 suppository rectally as needed for constipation., Disp: , Rfl:  .  ibuprofen (ADVIL,MOTRIN) 400 MG tablet, Take 400 mg by mouth every 6 (six) hours as needed., Disp: , Rfl:  .  lactulose (CHRONULAC) 10 GM/15ML solution, Take 10 g by mouth 2 (two) times daily., Disp: , Rfl:  .  metoprolol tartrate (LOPRESSOR) 100 MG tablet, Take 1 tablet (100 mg total) by mouth 2 (two) times daily., Disp: 180 tablet, Rfl: 1 .  ondansetron (ZOFRAN) 4 MG tablet, Take 4 mg by mouth every 6 (six) hours as needed., Disp: ,  Rfl: 0 .  polyethylene glycol powder (GLYCOLAX/MIRALAX) powder, Take 17 g by mouth as needed., Disp: , Rfl: 0 .  predniSONE (DELTASONE) 5 MG tablet, take 2 tablets by mouth once daily with BREAKFAST, Disp: 60 tablet, Rfl: 1 .  PROAIR HFA 108 (90 BASE) MCG/ACT inhaler, Inhale 108 mcg into the lungs every 6 (six) hours as needed., Disp: , Rfl:  .  Probiotic Product (CULTURELLE IMMUNE DEFENSE PO), Take 1 capsule by mouth daily., Disp: , Rfl:  .  pseudoephedrine (SUDAFED) 60 MG tablet, Take 1 tablet (60 mg total) by mouth every 4 (four) hours as needed for congestion., Disp: 24 tablet, Rfl: 0 .  ranitidine (ZANTAC) 75 MG tablet, Take 75 mg by mouth 2 (two) times daily., Disp: , Rfl:  .  SAPHRIS 5 MG SUBL 24 hr tablet, Take 5 mg by mouth daily., Disp: , Rfl: 0 .  Vitamin D, Cholecalciferol, 1000 units TABS, Take 500 Units by mouth daily. , Disp: , Rfl:  .  voriconazole (VFEND) 200 MG tablet, Take 200 mg by mouth 2 (two) times daily., Disp: , Rfl:   Past Medical History: Past Medical History:  Diagnosis Date  . Anxiety   . Asthma   . Depression   . Fibromyalgia   . Incontinence   . Lymphadenopathy   . Pneumonia    bilateral community acquired   . PVC's (premature ventricular contractions)   .  Sarcoidosis   . Sepsis ([place])   . Shortness of breath dyspnea   . Tachycardia     Tobacco Use: Social History   Tobacco Use  Smoking Status Never Smoker  Smokeless Tobacco Never Used    Labs: Recent Review Flowsheet Data    There is no flowsheet data to display.       Pulmonary Assessment Scores: Pulmonary Assessment Scores    Row Name 03/21/18 1530         ADL UCSD   ADL Phase  Entry     SOB Score total  61     Rest  3     Walk  3     Stairs  4     Bath  2     Dress  3     Shop  4       CAT Score   CAT Score  23       mMRC Score   mMRC Score  2        Pulmonary Function Assessment: Pulmonary Function Assessment - 03/21/18 1531      Breath   Bilateral Breath  Sounds  Clear;Decreased    Shortness of Breath  Yes;Fear of Shortness of Breath;Panic with Shortness of Breath;Limiting activity       Exercise Target Goals: Date: 03/21/18  Exercise Program Goal: Individual exercise prescription set using results from initial 6 min walk test and THRR while considering  patient's activity barriers and safety.    Exercise Prescription Goal: Initial exercise prescription builds to 30-45 minutes a day of aerobic activity, 2-3 days per week.  Home exercise guidelines will be given to patient during program as part of exercise prescription that the participant will acknowledge.  Activity Barriers & Risk Stratification: Activity Barriers & Cardiac Risk Stratification - 03/21/18 1533      Activity Barriers & Cardiac Risk Stratification   Activity Barriers  Deconditioning;Muscular Weakness;Shortness of Breath;Balance Concerns       6 Minute Walk: 6 Minute Walk    Row Name 03/21/18 1531         6 Minute Walk   Phase  Initial     Distance  1070 feet     Walk Time  6 minutes     # of Rest Breaks  0     MPH  2.02     METS  4.15     RPE  11     Perceived Dyspnea   3     VO2 Peak  14.53     Symptoms  Yes (comment)     Comments  SOB     Resting HR  99 bpm     Resting BP  126/64     Resting Oxygen Saturation   98 %     Exercise Oxygen Saturation  during 6 min walk  95 %     Max Ex. HR  132 bpm     Max Ex. BP  144/72     2 Minute Post BP  126/64       Interval HR   1 Minute HR  122     2 Minute HR  126     3 Minute HR  122     4 Minute HR  127     5 Minute HR  132     6 Minute HR  128     2 Minute Post HR  109     Interval Heart Rate?  Yes  Interval Oxygen   Interval Oxygen?  Yes     Baseline Oxygen Saturation %  98 %     1 Minute Oxygen Saturation %  97 %     1 Minute Liters of Oxygen  0 L Room Air     2 Minute Oxygen Saturation %  97 %     2 Minute Liters of Oxygen  0 L     3 Minute Oxygen Saturation %  97 %     3 Minute  Liters of Oxygen  0 L     4 Minute Oxygen Saturation %  97 % 3:58 95%     4 Minute Liters of Oxygen  0 L     5 Minute Oxygen Saturation %  98 %     5 Minute Liters of Oxygen  0 L     6 Minute Oxygen Saturation %  97 %     6 Minute Liters of Oxygen  0 L     2 Minute Post Oxygen Saturation %  98 %     2 Minute Post Liters of Oxygen  0 L       Oxygen Initial Assessment: Oxygen Initial Assessment - 03/21/18 1539      Home Oxygen   Home Oxygen Device  None    Sleep Oxygen Prescription  None    Home Exercise Oxygen Prescription  None    Home at Rest Exercise Oxygen Prescription  None      Initial 6 min Walk   Oxygen Used  None      Program Oxygen Prescription   Program Oxygen Prescription  None      Intervention   Short Term Goals  To learn and demonstrate proper use of respiratory medications;To learn and demonstrate proper pursed lip breathing techniques or other breathing techniques.;To learn and understand importance of maintaining oxygen saturations>88%;To learn and understand importance of monitoring SPO2 with pulse oximeter and demonstrate accurate use of the pulse oximeter.    Long  Term Goals  Verbalizes importance of monitoring SPO2 with pulse oximeter and return demonstration;Maintenance of O2 saturations>88%;Exhibits proper breathing techniques, such as pursed lip breathing or other method taught during program session;Compliance with respiratory medication;Demonstrates proper use of MDI's       Oxygen Re-Evaluation:   Oxygen Discharge (Final Oxygen Re-Evaluation):   Initial Exercise Prescription: Initial Exercise Prescription - 03/21/18 1600      Date of Initial Exercise RX and Referring Provider   Date  03/21/18    Referring Provider  Flora Lipps MD      Treadmill   MPH  2    Grade  0    Minutes  1    METs  2.53      NuStep   Level  2    SPM  80    Minutes  15    METs  2.5      REL-XR   Level  1    Speed  50    Minutes  15    METs  2.5       Prescription Details   Frequency (times per week)  3    Duration  Progress to 45 minutes of aerobic exercise without signs/symptoms of physical distress      Intensity   THRR 40-80% of Max Heartrate  129-158    Ratings of Perceived Exertion  11-13    Perceived Dyspnea  0-4      Progression   Progression  Continue to  progress workloads to maintain intensity without signs/symptoms of physical distress.      Resistance Training   Training Prescription  Yes    Weight  3 lbs    Reps  10-15       Perform Capillary Blood Glucose checks as needed.  Exercise Prescription Changes: Exercise Prescription Changes    Row Name 03/21/18 1500             Response to Exercise   Blood Pressure (Admit)  126/64       Blood Pressure (Exercise)  144/72       Blood Pressure (Exit)  126/64       Heart Rate (Admit)  99 bpm       Heart Rate (Exercise)  132 bpm       Heart Rate (Exit)  109 bpm       Oxygen Saturation (Admit)  98 %       Oxygen Saturation (Exercise)  95 %       Oxygen Saturation (Exit)  98 %       Rating of Perceived Exertion (Exercise)  11       Perceived Dyspnea (Exercise)  3       Symptoms  SOB       Comments  walk test results          Exercise Comments:   Exercise Goals and Review: Exercise Goals    Row Name 03/21/18 1534             Exercise Goals   Increase Physical Activity  Yes       Intervention  Provide advice, education, support and counseling about physical activity/exercise needs.;Develop an individualized exercise prescription for aerobic and resistive training based on initial evaluation findings, risk stratification, comorbidities and participant's personal goals.       Expected Outcomes  Short Term: Attend rehab on a regular basis to increase amount of physical activity.;Long Term: Add in home exercise to make exercise part of routine and to increase amount of physical activity.;Long Term: Exercising regularly at least 3-5 days a week.       Increase  Strength and Stamina  Yes       Intervention  Provide advice, education, support and counseling about physical activity/exercise needs.;Develop an individualized exercise prescription for aerobic and resistive training based on initial evaluation findings, risk stratification, comorbidities and participant's personal goals.       Expected Outcomes  Short Term: Increase workloads from initial exercise prescription for resistance, speed, and METs.;Short Term: Perform resistance training exercises routinely during rehab and add in resistance training at home;Long Term: Improve cardiorespiratory fitness, muscular endurance and strength as measured by increased METs and functional capacity (6MWT)       Able to understand and use rate of perceived exertion (RPE) scale  Yes       Intervention  Provide education and explanation on how to use RPE scale       Expected Outcomes  Short Term: Able to use RPE daily in rehab to express subjective intensity level;Long Term:  Able to use RPE to guide intensity level when exercising independently       Able to understand and use Dyspnea scale  Yes       Intervention  Provide education and explanation on how to use Dyspnea scale       Expected Outcomes  Short Term: Able to use Dyspnea scale daily in rehab to express subjective sense of shortness of breath during exertion;Long  Term: Able to use Dyspnea scale to guide intensity level when exercising independently       Knowledge and understanding of Target Heart Rate Range (THRR)  Yes       Intervention  Provide education and explanation of THRR including how the numbers were predicted and where they are located for reference       Expected Outcomes  Short Term: Able to state/look up THRR;Short Term: Able to use daily as guideline for intensity in rehab;Long Term: Able to use THRR to govern intensity when exercising independently       Able to check pulse independently  Yes       Intervention  Provide education and  demonstration on how to check pulse in carotid and radial arteries.;Review the importance of being able to check your own pulse for safety during independent exercise       Expected Outcomes  Short Term: Able to explain why pulse checking is important during independent exercise;Long Term: Able to check pulse independently and accurately       Understanding of Exercise Prescription  Yes       Intervention  Provide education, explanation, and written materials on patient's individual exercise prescription       Expected Outcomes  Short Term: Able to explain program exercise prescription;Long Term: Able to explain home exercise prescription to exercise independently          Exercise Goals Re-Evaluation :   Discharge Exercise Prescription (Final Exercise Prescription Changes): Exercise Prescription Changes - 03/21/18 1500      Response to Exercise   Blood Pressure (Admit)  126/64    Blood Pressure (Exercise)  144/72    Blood Pressure (Exit)  126/64    Heart Rate (Admit)  99 bpm    Heart Rate (Exercise)  132 bpm    Heart Rate (Exit)  109 bpm    Oxygen Saturation (Admit)  98 %    Oxygen Saturation (Exercise)  95 %    Oxygen Saturation (Exit)  98 %    Rating of Perceived Exertion (Exercise)  11    Perceived Dyspnea (Exercise)  3    Symptoms  SOB    Comments  walk test results       Nutrition:  Target Goals: Understanding of nutrition guidelines, daily intake of sodium <1577m, cholesterol <2020m calories 30% from fat and 7% or less from saturated fats, daily to have 5 or more servings of fruits and vegetables.  Biometrics: Pre Biometrics - 03/21/18 1534      Pre Biometrics   Height  4' 11.4" (1.509 m)    Weight  125 lb 3.2 oz (56.8 kg)    Waist Circumference  28 inches    Hip Circumference  37 inches    Waist to Hip Ratio  0.76 %    BMI (Calculated)  24.94    Single Leg Stand  3.8 seconds        Nutrition Therapy Plan and Nutrition Goals: Nutrition Therapy & Goals -  03/21/18 1527      Personal Nutrition Goals   Nutrition Goal  Meet with the dietician    Personal Goal #2  Lose some weight    Personal Goal #3  come up with a budget for eating    Comments  Cost is a factor in her diet. She cant afford much and wants to learn how to eat better on a budget.      Intervention Plan   Intervention  Prescribe, educate and  counsel regarding individualized specific dietary modifications aiming towards targeted core components such as weight, hypertension, lipid management, diabetes, heart failure and other comorbidities.;Nutrition handout(s) given to patient.    Expected Outcomes  Short Term Goal: Understand basic principles of dietary content, such as calories, fat, sodium, cholesterol and nutrients.;Long Term Goal: Adherence to prescribed nutrition plan.       Nutrition Assessments: Nutrition Assessments - 03/21/18 1523      MEDFICTS Scores   Pre Score  39       Nutrition Goals Re-Evaluation:   Nutrition Goals Discharge (Final Nutrition Goals Re-Evaluation):   Psychosocial: Target Goals: Acknowledge presence or absence of significant depression and/or stress, maximize coping skills, provide positive support system. Participant is able to verbalize types and ability to use techniques and skills needed for reducing stress and depression.   Initial Review & Psychosocial Screening: Initial Psych Review & Screening - 03/21/18 1520      Initial Review   Current issues with  Current Psychotropic Meds;Current Depression;Current Sleep Concerns;Current Stress Concerns;History of Depression    Source of Stress Concerns  Chronic Illness;Financial;Poor Coping Skills;Unable to participate in former interests or hobbies    Comments  Her lung disease and financial are the main concerns of her drepression.      Family Dynamics   Good Support System?  No    Strains  Intra-family strains    Comments  She does not rely on family for support but can talk to her  boyfriend for support.      Barriers   Psychosocial barriers to participate in program  The patient should benefit from training in stress management and relaxation.;Psychosocial barriers identified (see note)      Screening Interventions   Interventions  Encouraged to exercise;To provide support and resources with identified psychosocial needs;Program counselor consult;Provide feedback about the scores to participant    Expected Outcomes  Short Term goal: Utilizing psychosocial counselor, staff and physician to assist with identification of specific Stressors or current issues interfering with healing process. Setting desired goal for each stressor or current issue identified.;Long Term Goal: Stressors or current issues are controlled or eliminated.;Short Term goal: Identification and review with participant of any Quality of Life or Depression concerns found by scoring the questionnaire.;Long Term goal: The participant improves quality of Life and PHQ9 Scores as seen by post scores and/or verbalization of changes       Quality of Life Scores:  Scores of 19 and below usually indicate a poorer quality of life in these areas.  A difference of  2-3 points is a clinically meaningful difference.  A difference of 2-3 points in the total score of the Quality of Life Index has been associated with significant improvement in overall quality of life, self-image, physical symptoms, and general health in studies assessing change in quality of life.  PHQ-9: Recent Review Flowsheet Data    Depression screen [facility] 2/9 03/21/2018 12/24/2017   Decreased Interest 1 2   Down, Depressed, Hopeless 2 2   PHQ - 2 Score 3 4   Altered sleeping 2 2   Tired, decreased energy 2 2   Change in appetite 2 0   Feeling bad or failure about yourself  2 1   Trouble concentrating 3 2   Moving slowly or fidgety/restless 0 2   Suicidal thoughts 0 0   PHQ-9 Score 14 13   Difficult doing work/chores Somewhat difficult Somewhat  difficult      Interpretation of Total Score  Total  Score Depression Severity:  1-4 = Minimal depression, 5-9 = Mild depression, 10-14 = Moderate depression, 15-19 = Moderately severe depression, 20-27 = Severe depression   Psychosocial Evaluation and Intervention:   Psychosocial Re-Evaluation:   Psychosocial Discharge (Final Psychosocial Re-Evaluation):   Education: Education Goals: Education classes will be provided on a weekly basis, covering required topics. Participant will state understanding/return demonstration of topics presented.  Learning Barriers/Preferences: Learning Barriers/Preferences - 03/21/18 1531      Learning Barriers/Preferences   Learning Barriers  Sight    Learning Preferences  None       Education Topics:  Initial Evaluation Education: - Verbal, written and demonstration of respiratory meds, oximetry and breathing techniques. Instruction on use of nebulizers and MDIs and importance of monitoring MDI activations.   Pulmonary Rehab from 03/21/2018 in [facility] Cardiac and Pulmonary Rehab  Date  03/21/18  Educator  [facility]  Instruction Review Code  1- Verbalizes Understanding      General Nutrition Guidelines/Fats and Fiber: -Group instruction provided by verbal, written material, models and posters to present the general guidelines for heart healthy nutrition. Gives an explanation and review of dietary fats and fiber.   Controlling Sodium/Reading Food Labels: -Group verbal and written material supporting the discussion of sodium use in heart healthy nutrition. Review and explanation with models, verbal and written materials for utilization of the food label.   Exercise Physiology & General Exercise Guidelines: - Group verbal and written instruction with models to review the exercise physiology of the cardiovascular system and associated critical values. Provides general exercise guidelines with specific guidelines to those with heart or lung disease.     Aerobic Exercise & Resistance Training: - Gives group verbal and written instruction on the various components of exercise. Focuses on aerobic and resistive training programs and the benefits of this training and how to safely progress through these programs.   Flexibility, Balance, Mind/Body Relaxation: Provides group verbal/written instruction on the benefits of flexibility and balance training, including mind/body exercise modes such as yoga, pilates and tai chi.  Demonstration and skill practice provided.   Stress and Anxiety: - Provides group verbal and written instruction about the health risks of elevated stress and causes of high stress.  Discuss the correlation between heart/lung disease and anxiety and treatment options. Review healthy ways to manage with stress and anxiety.   Depression: - Provides group verbal and written instruction on the correlation between heart/lung disease and depressed mood, treatment options, and the stigmas associated with seeking treatment.   Exercise & Equipment Safety: - Individual verbal instruction and demonstration of equipment use and safety with use of the equipment.   Pulmonary Rehab from 03/21/2018 in [facility] Cardiac and Pulmonary Rehab  Date  03/21/18  Educator  [facility]  Instruction Review Code  1- Verbalizes Understanding      Infection Prevention: - Provides verbal and written material to individual with discussion of infection control including proper hand washing and proper equipment cleaning during exercise session.   Pulmonary Rehab from 03/21/2018 in [facility] Cardiac and Pulmonary Rehab  Date  03/21/18  Educator  [facility]  Instruction Review Code  1- Verbalizes Understanding      Falls Prevention: - Provides verbal and written material to individual with discussion of falls prevention and safety.   Pulmonary Rehab from 03/21/2018 in [facility] Cardiac and Pulmonary Rehab  Date  03/21/18  Educator  [facility]  Instruction Review Code  1- Verbalizes  Understanding      Diabetes: - Individual verbal and written  instruction to review signs/symptoms of diabetes, desired ranges of glucose level fasting, after meals and with exercise. Advice that pre and post exercise glucose checks will be done for 3 sessions at entry of program.   Chronic Lung Diseases: - Group verbal and written instruction to review updates, respiratory medications, advancements in procedures and treatments. Discuss use of supplemental oxygen including available portable oxygen systems, continuous and intermittent flow rates, concentrators, personal use and safety guidelines. Review proper use of inhaler and spacers. Provide informative websites for self-education.    Energy Conservation: - Provide group verbal and written instruction for methods to conserve energy, plan and organize activities. Instruct on pacing techniques, use of adaptive equipment and posture/positioning to relieve shortness of breath.   Triggers and Exacerbations: - Group verbal and written instruction to review types of environmental triggers and ways to prevent exacerbations. Discuss weather changes, air quality and the benefits of nasal washing. Review warning signs and symptoms to help prevent infections. Discuss techniques for effective airway clearance, coughing, and vibrations.   AED/CPR: - Group verbal and written instruction with the use of models to demonstrate the basic use of the AED with the basic ABC's of resuscitation.   Anatomy and Physiology of the Lungs: - Group verbal and written instruction with the use of models to provide basic lung anatomy and physiology related to function, structure and complications of lung disease.   Anatomy & Physiology of the Heart: - Group verbal and written instruction and models provide basic cardiac anatomy and physiology, with the coronary electrical and arterial systems. Review of Valvular disease and Heart Failure   Cardiac Medications: -  Group verbal and written instruction to review commonly prescribed medications for heart disease. Reviews the medication, class of the drug, and side effects.   Know Your Numbers and Risk Factors: -Group verbal and written instruction about important numbers in your health.  Discussion of what are risk factors and how they play a role in the disease process.  Review of Cholesterol, Blood Pressure, Diabetes, and BMI and the role they play in your overall health.   Sleep Hygiene: -Provides group verbal and written instruction about how sleep can affect your health.  Define sleep hygiene, discuss sleep cycles and impact of sleep habits. Review good sleep hygiene tips.    Other: -Provides group and verbal instruction on various topics (see comments)    Knowledge Questionnaire Score: Knowledge Questionnaire Score - 03/21/18 1532      Knowledge Questionnaire Score   Pre Score  10/18 reviewed with patient        Core Components/Risk Factors/Patient Goals at Admission: Personal Goals and Risk Factors at Admission - 03/21/18 1540      Core Components/Risk Factors/Patient Goals on Admission    Weight Management  Yes;Weight Loss    Intervention  Weight Management: Develop a combined nutrition and exercise program designed to reach desired caloric intake, while maintaining appropriate intake of nutrient and fiber, sodium and fats, and appropriate energy expenditure required for the weight goal.;Weight Management: Provide education and appropriate resources to help participant work on and attain dietary goals.;Weight Management/Obesity: Establish reasonable short term and long term weight goals.    Admit Weight  125 lb 8 oz (56.9 kg)    Goal Weight: Short Term  120 lb (54.4 kg)    Goal Weight: Long Term  120 lb (54.4 kg)    Expected Outcomes  Short Term: Continue to assess and modify interventions until short term weight is achieved;Long Term: Adherence  to nutrition and physical activity/exercise  program aimed toward attainment of established weight goal;Weight Maintenance: Understanding of the daily nutrition guidelines, which includes 25-35% calories from fat, 7% or less cal from saturated fats, less than 266m cholesterol, less than 1.5gm of sodium, & 5 or more servings of fruits and vegetables daily;Weight Loss: Understanding of general recommendations for a balanced deficit meal plan, which promotes 1-2 lb weight loss per week and includes a negative energy balance of [phone] kcal/d;Understanding recommendations for meals to include 15-35% energy as protein, 25-35% energy from fat, 35-60% energy from carbohydrates, less than 2012mof dietary cholesterol, 20-35 gm of total fiber daily;Understanding of distribution of calorie intake throughout the day with the consumption of 4-5 meals/snacks    Improve shortness of breath with ADL's  Yes    Intervention  Provide education, individualized exercise plan and daily activity instruction to help decrease symptoms of SOB with activities of daily living.    Expected Outcomes  Short Term: Improve cardiorespiratory fitness to achieve a reduction of symptoms when performing ADLs;Long Term: Be able to perform more ADLs without symptoms or delay the onset of symptoms    Hypertension  Yes    Intervention  Provide education on lifestyle modifcations including regular physical activity/exercise, weight management, moderate sodium restriction and increased consumption of fresh fruit, vegetables, and low fat dairy, alcohol moderation, and smoking cessation.;Monitor prescription use compliance.    Expected Outcomes  Short Term: Continued assessment and intervention until BP is < 140/9053mG in hypertensive participants. < 130/69m32m in hypertensive participants with diabetes, heart failure or chronic kidney disease.;Long Term: Maintenance of blood pressure at goal levels.    Lipids  Yes    Intervention  Provide education and support for participant on nutrition &  aerobic/resistive exercise along with prescribed medications to achieve LDL <70mg27mL >40mg.35mExpected Outcomes  Short Term: Participant states understanding of desired cholesterol values and is compliant with medications prescribed. Participant is following exercise prescription and nutrition guidelines.;Long Term: Cholesterol controlled with medications as prescribed, with individualized exercise RX and with personalized nutrition plan. Value goals: LDL < 70mg, 47m> 40 mg.       Core Components/Risk Factors/Patient Goals Review:    Core Components/Risk Factors/Patient Goals at Discharge (Final Review):    ITP Comments: ITP Comments    Row Name 03/21/18 1418           ITP Comments  Medical Evaluation completed. Chart sent for review and changes to Dr[facility]          Comments: Initial ITP

## 2018-03-21 NOTE — Patient Instructions (Signed)
Patient Instructions  Patient Details  Name: Jennifer [name] MRN: [id number] Date of Birth: 05/23/1971 Referring Provider:  Erin FullingKasa, Kurian, MD  Below are your personal goals for exercise, nutrition, and risk factors. Our goal is to help you stay on track towards obtaining and maintaining these goals. We will be discussing your progress on these goals with you throughout the program.  Initial Exercise Prescription: Initial Exercise Prescription - 03/21/18 1600      Date of Initial Exercise RX and Referring Provider   Date  03/21/18    Referring Provider  Erin FullingKasa, Kurian MD      Treadmill   MPH  2    Grade  0    Minutes  1    METs  2.53      NuStep   Level  2    SPM  80    Minutes  15    METs  2.5      REL-XR   Level  1    Speed  50    Minutes  15    METs  2.5      Prescription Details   Frequency (times per week)  3    Duration  Progress to 45 minutes of aerobic exercise without signs/symptoms of physical distress      Intensity   THRR 40-80% of Max Heartrate  129-158    Ratings of Perceived Exertion  11-13    Perceived Dyspnea  0-4      Progression   Progression  Continue to progress workloads to maintain intensity without signs/symptoms of physical distress.      Resistance Training   Training Prescription  Yes    Weight  3 lbs    Reps  10-15       Exercise Goals: Frequency: Be able to perform aerobic exercise two to three times per week in program working toward 2-5 days per week of home exercise.  Intensity: Work with a perceived exertion of 11 (fairly light) - 15 (hard) while following your exercise prescription.  We will make changes to your prescription with you as you progress through the program.   Duration: Be able to do 30 to 45 minutes of continuous aerobic exercise in addition to a 5 minute warm-up and a 5 minute cool-down routine.   Nutrition Goals: Your personal nutrition goals will be established when you do your nutrition analysis with the  dietician.  The following are general nutrition guidelines to follow: Cholesterol < 200mg /day Sodium < 1500mg /day Fiber: Women under 50 yrs - 25 grams per day  Personal Goals: Personal Goals and Risk Factors at Admission - 03/21/18 1540      Core Components/Risk Factors/Patient Goals on Admission    Weight Management  Yes;Weight Loss    Intervention  Weight Management: Develop a combined nutrition and exercise program designed to reach desired caloric intake, while maintaining appropriate intake of nutrient and fiber, sodium and fats, and appropriate energy expenditure required for the weight goal.;Weight Management: Provide education and appropriate resources to help participant work on and attain dietary goals.;Weight Management/Obesity: Establish reasonable short term and long term weight goals.    Admit Weight  125 lb 8 oz (56.9 kg)    Goal Weight: Short Term  120 lb (54.4 kg)    Goal Weight: Long Term  120 lb (54.4 kg)    Expected Outcomes  Short Term: Continue to assess and modify interventions until short term weight is achieved;Long Term: Adherence to nutrition and physical activity/exercise program  aimed toward attainment of established weight goal;Weight Maintenance: Understanding of the daily nutrition guidelines, which includes 25-35% calories from fat, 7% or less cal from saturated fats, less than 200mg  cholesterol, less than 1.5gm of sodium, & 5 or more servings of fruits and vegetables daily;Weight Loss: Understanding of general recommendations for a balanced deficit meal plan, which promotes 1-2 lb weight loss per week and includes a negative energy balance of [phone] kcal/d;Understanding recommendations for meals to include 15-35% energy as protein, 25-35% energy from fat, 35-60% energy from carbohydrates, less than 200mg  of dietary cholesterol, 20-35 gm of total fiber daily;Understanding of distribution of calorie intake throughout the day with the consumption of 4-5 meals/snacks     Improve shortness of breath with ADL's  Yes    Intervention  Provide education, individualized exercise plan and daily activity instruction to help decrease symptoms of SOB with activities of daily living.    Expected Outcomes  Short Term: Improve cardiorespiratory fitness to achieve a reduction of symptoms when performing ADLs;Long Term: Be able to perform more ADLs without symptoms or delay the onset of symptoms    Hypertension  Yes    Intervention  Provide education on lifestyle modifcations including regular physical activity/exercise, weight management, moderate sodium restriction and increased consumption of fresh fruit, vegetables, and low fat dairy, alcohol moderation, and smoking cessation.;Monitor prescription use compliance.    Expected Outcomes  Short Term: Continued assessment and intervention until BP is < 140/64mm HG in hypertensive participants. < 130/11mm HG in hypertensive participants with diabetes, heart failure or chronic kidney disease.;Long Term: Maintenance of blood pressure at goal levels.    Lipids  Yes    Intervention  Provide education and support for participant on nutrition & aerobic/resistive exercise along with prescribed medications to achieve LDL 70mg , HDL >40mg .    Expected Outcomes  Short Term: Participant states understanding of desired cholesterol values and is compliant with medications prescribed. Participant is following exercise prescription and nutrition guidelines.;Long Term: Cholesterol controlled with medications as prescribed, with individualized exercise RX and with personalized nutrition plan. Value goals: LDL < 70mg , HDL > 40 mg.       Tobacco Use Initial Evaluation: Social History   Tobacco Use  Smoking Status Never Smoker  Smokeless Tobacco Never Used    Exercise Goals and Review: Exercise Goals    Row Name 03/21/18 1534             Exercise Goals   Increase Physical Activity  Yes       Intervention  Provide advice, education,  support and counseling about physical activity/exercise needs.;Develop an individualized exercise prescription for aerobic and resistive training based on initial evaluation findings, risk stratification, comorbidities and participant's personal goals.       Expected Outcomes  Short Term: Attend rehab on a regular basis to increase amount of physical activity.;Long Term: Add in home exercise to make exercise part of routine and to increase amount of physical activity.;Long Term: Exercising regularly at least 3-5 days a week.       Increase Strength and Stamina  Yes       Intervention  Provide advice, education, support and counseling about physical activity/exercise needs.;Develop an individualized exercise prescription for aerobic and resistive training based on initial evaluation findings, risk stratification, comorbidities and participant's personal goals.       Expected Outcomes  Short Term: Increase workloads from initial exercise prescription for resistance, speed, and METs.;Short Term: Perform resistance training exercises routinely during rehab and add in resistance  training at home;Long Term: Improve cardiorespiratory fitness, muscular endurance and strength as measured by increased METs and functional capacity ( )       Able to understand and use rate of perceived exertion (RPE) scale  Yes       Intervention  Provide education and explanation on how to use RPE scale       Expected Outcomes  Short Term: Able to use RPE daily in rehab to express subjective intensity level;Long Term:  Able to use RPE to guide intensity level when exercising independently       Able to understand and use Dyspnea scale  Yes       Intervention  Provide education and explanation on how to use Dyspnea scale       Expected Outcomes  Short Term: Able to use Dyspnea scale daily in rehab to express subjective sense of shortness of breath during exertion;Long Term: Able to use Dyspnea scale to guide intensity level when  exercising independently       Knowledge and understanding of Target Heart Rate Range (THRR)  Yes       Intervention  Provide education and explanation of THRR including how the numbers were predicted and where they are located for reference       Expected Outcomes  Short Term: Able to state/look up THRR;Short Term: Able to use daily as guideline for intensity in rehab;Long Term: Able to use THRR to govern intensity when exercising independently       Able to check pulse independently  Yes       Intervention  Provide education and demonstration on how to check pulse in carotid and radial arteries.;Review the importance of being able to check your own pulse for safety during independent exercise       Expected Outcomes  Short Term: Able to explain why pulse checking is important during independent exercise;Long Term: Able to check pulse independently and accurately       Understanding of Exercise Prescription  Yes       Intervention  Provide education, explanation, and written materials on patient's individual exercise prescription       Expected Outcomes  Short Term: Able to explain program exercise prescription;Long Term: Able to explain home exercise prescription to exercise independently          Copy of goals given to participant.

## 2018-03-21 NOTE — Progress Notes (Signed)
Daily Session Note  Patient Details  Name: Jennifer [name] MRN: [id number] Date of Birth: Aug 30, 1971 Referring Provider:    Encounter Date: 03/21/2018  Check In: Session Check In - 03/21/18 1417      Check-In   Location  [facility]    Staff Present  Justin Mend Lorre Nick, [place], RCEP, CCRP, Exercise Physiologist    Supervising physician immediately available to respond to emergencies  [facility] immediately available ER MD    Physician(s)  Dr. Corky Downs and Jimmye Norman    Medication changes reported      No    Fall or balance concerns reported     No    Warm-up and Cool-down  Not performed (comment) medical evaluation    Resistance Training Performed  No    VAD Patient?  No      Pain Assessment   Currently in Pain?  No/denies        Exercise Prescription Changes - 03/21/18 1500      Response to Exercise   Blood Pressure (Admit)  126/64    Blood Pressure (Exercise)  144/72    Blood Pressure (Exit)  126/64    Heart Rate (Admit)  99 bpm    Heart Rate (Exercise)  132 bpm    Heart Rate (Exit)  109 bpm    Oxygen Saturation (Admit)  98 %    Oxygen Saturation (Exercise)  95 %    Oxygen Saturation (Exit)  98 %    Rating of Perceived Exertion (Exercise)  11    Perceived Dyspnea (Exercise)  3    Symptoms  SOB    Comments  walk test results       Social History   Tobacco Use  Smoking Status Never Smoker  Smokeless Tobacco Never Used    Goals Met:  Exercise tolerated well No report of cardiac concerns or symptoms Strength training completed today  Goals Unmet:  Not Applicable  Comments:  6 Minute Walk    Row Name 03/21/18 1531         6 Minute Walk   Phase  Initial     Distance  1070 feet     Walk Time  6 minutes     # of Rest Breaks  0     MPH  2.02     METS  4.15     RPE  11     Perceived Dyspnea   3     VO2 Peak  14.53     Symptoms  Yes (comment)     Comments  SOB     Resting HR  99 bpm     Resting BP  126/64     Resting Oxygen Saturation   98 %     Exercise Oxygen Saturation  during 6 min walk  95 %     Max Ex. HR  132 bpm     Max Ex. BP  144/72     2 Minute Post BP  126/64       Interval HR   1 Minute HR  122     2 Minute HR  126     3 Minute HR  122     4 Minute HR  127     5 Minute HR  132     6 Minute HR  128     2 Minute Post HR  109     Interval Heart Rate?  Yes       Interval  Oxygen   Interval Oxygen?  Yes     Baseline Oxygen Saturation %  98 %     1 Minute Oxygen Saturation %  97 %     1 Minute Liters of Oxygen  0 L Room Air     2 Minute Oxygen Saturation %  97 %     2 Minute Liters of Oxygen  0 L     3 Minute Oxygen Saturation %  97 %     3 Minute Liters of Oxygen  0 L     4 Minute Oxygen Saturation %  97 % 3:58 95%     4 Minute Liters of Oxygen  0 L     5 Minute Oxygen Saturation %  98 %     5 Minute Liters of Oxygen  0 L     6 Minute Oxygen Saturation %  97 %     6 Minute Liters of Oxygen  0 L     2 Minute Post Oxygen Saturation %  98 %     2 Minute Post Liters of Oxygen  0 L      Medical Evaluation. Service time from 1430-1550.    Dr. Emily Filbert is Medical Director for [place] and [facility].

## 2018-03-30 ENCOUNTER — Telehealth: Payer: Self-pay | Admitting: Internal Medicine

## 2018-03-30 DIAGNOSIS — D869 Sarcoidosis, unspecified: Secondary | ICD-10-CM

## 2018-03-30 DIAGNOSIS — R635 Abnormal weight gain: Secondary | ICD-10-CM | POA: Diagnosis not present

## 2018-03-30 NOTE — Progress Notes (Signed)
Daily Session Note  Patient Details  Name: Jennifer [name] MRN: [id number] Date of Birth: 02-04-1971 Referring Provider:     Pulmonary Rehab from 03/21/2018 in [facility] Pulmonary Rehab  Referring Provider  Flora Lipps MD      Encounter Date: 03/30/2018  Check In: Session Check In - 03/30/18 1023      Check-In   Location  [facility]    Staff Present  Justin Mend Lorre Nick, MA, RCEP, CCRP, Exercise Physiologist;Amanda Oletta Darter, [place], ACSM CEP, Exercise Physiologist    Supervising physician immediately available to respond to emergencies  [facility] immediately available ER MD    Physician(s)  Burlene Arnt and Paduchowski    Medication changes reported      No    Fall or balance concerns reported     No    Warm-up and Cool-down  Performed as group-led instruction    Resistance Training Performed  Yes    VAD Patient?  No    PAD/SET Patient?  No      Pain Assessment   Currently in Pain?  No/denies    Multiple Pain Sites  No          Social History   Tobacco Use  Smoking Status Never Smoker  Smokeless Tobacco Never Used    Goals Met:  Proper associated with RPD/PD & O2 Sat Personal goals reviewed Queuing for purse lip breathing Strength training completed today  Goals Unmet:  Not Applicable  Comments: First full day of exercise!  Patient was oriented to gym and equipment including functions, settings, policies, and procedures.  Patient's individual exercise prescription and treatment plan were reviewed.  All starting workloads were established based on the results of the 6 minute walk test done at initial orientation visit.  The plan for exercise progression was also introduced and progression will be customized based on patient's performance and goals.    Dr. Emily Filbert is Medical Director for [place] and [facility].

## 2018-03-30 NOTE — Telephone Encounter (Signed)
Received records request from [employer], [facility]

## 2018-04-01 ENCOUNTER — Encounter: Payer: Medicaid Other | Admitting: *Deleted

## 2018-04-01 DIAGNOSIS — D869 Sarcoidosis, unspecified: Secondary | ICD-10-CM

## 2018-04-01 DIAGNOSIS — R635 Abnormal weight gain: Secondary | ICD-10-CM | POA: Diagnosis not present

## 2018-04-01 NOTE — Progress Notes (Signed)
Daily Session Note  Patient Details  Name: Jennifer [name] MRN: [id number] Date of Birth: 04-17-1971 Referring Provider:     Pulmonary Rehab from 03/21/2018 in [facility] Cardiac and Pulmonary Rehab  Referring Provider  Flora Lipps MD      Encounter Date: 04/01/2018  Check In: Session Check In - 04/01/18 1009      Check-In   Supervising physician immediately available to respond to emergencies  [facility] immediately available ER MD    Physician(s)  Dr. Corky Downs and [facility]    Location  [facility]    Staff Present  Justin Mend RCP,RRT,BSRT;Layloni Fahrner Sherryll Burger, RN Vickki Hearing, BA, ACSM CEP, Exercise Physiologist    Medication changes reported      No    Fall or balance concerns reported     No    Warm-up and Cool-down  Performed as group-led instruction    Resistance Training Performed  Yes    VAD Patient?  No      Pain Assessment   Currently in Pain?  No/denies          Social History   Tobacco Use  Smoking Status Never Smoker  Smokeless Tobacco Never Used    Goals Met:  Proper associated with RPD/PD & O2 Sat Independence with exercise equipment Using PLB without cueing & demonstrates good technique Exercise tolerated well No report of cardiac concerns or symptoms Strength training completed today  Goals Unmet:  Not Applicable  Comments: Pt able to follow exercise prescription today without complaint.  Will continue to monitor for progression.    Dr. Emily Filbert is Medical Director for [place] and [facility].

## 2018-04-04 ENCOUNTER — Ambulatory Visit (HOSPITAL_COMMUNITY): Admission: RE | Admit: 2018-04-04 | Payer: Medicaid Other | Source: Ambulatory Visit

## 2018-04-04 DIAGNOSIS — D869 Sarcoidosis, unspecified: Secondary | ICD-10-CM

## 2018-04-04 DIAGNOSIS — R635 Abnormal weight gain: Secondary | ICD-10-CM | POA: Diagnosis not present

## 2018-04-04 NOTE — Progress Notes (Signed)
Daily Session Note  Patient Details  Name: Jennifer [name] MRN: [id number] Date of Birth: [birth year] Referring Provider:     Pulmonary Rehab from 03/21/2018 in [facility] Cardiac and Pulmonary Rehab  Referring Provider  Flora Lipps MD      Encounter Date: 04/04/2018  Check In: Session Check In - 04/04/18 1155      Check-In   Supervising physician immediately available to respond to emergencies  [facility] immediately available ER MD    Physician(s)  Dr. Quentin Cornwall and [facility]    Location  [facility]    Staff Present  Justin Mend Jaci Carrel, BS, ACSM CEP, Exercise Physiologist;Amanda Oletta Darter, [place], ACSM CEP, Exercise Physiologist    Medication changes reported      No    Fall or balance concerns reported     No    Warm-up and Cool-down  Performed as group-led instruction    Resistance Training Performed  Yes    VAD Patient?  No      Pain Assessment   Currently in Pain?  No/denies          Social History   Tobacco Use  Smoking Status Never Smoker  Smokeless Tobacco Never Used    Goals Met:  Independence with exercise equipment Exercise tolerated well No report of cardiac concerns or symptoms Strength training completed today  Goals Unmet:  Not Applicable  Comments: Pt able to follow exercise prescription today without complaint.  Will continue to monitor for progression.   Dr. Emily Filbert is Medical Director for [place] and [facility].

## 2018-04-04 NOTE — Progress Notes (Signed)
Pulmonary Individual Treatment Plan  Patient Details  Name: Jennifer [name] MRN: [id number] Date of Birth: 1971/05/03 Referring Provider:     Pulmonary Rehab from 03/21/2018 in [facility] Cardiac and Pulmonary Rehab  Referring Provider  Flora Lipps MD      Initial Encounter Date:    Pulmonary Rehab from 03/21/2018 in [facility] Cardiac and Pulmonary Rehab  Date  03/21/18      Visit Diagnosis: Sarcoidosis  Patient's Home Medications on Admission:  Current Outpatient Medications:  .  acetaminophen (TYLENOL) 500 MG tablet, Take 500 mg by mouth every 6 (six) hours as needed., Disp: , Rfl:  .  ADVAIR DISKUS 250-50 MCG/DOSE AEPB, Inhale 1 puff into the lungs as directed. (Patient taking differently: Inhale 1 puff into the lungs 2 (two) times daily. ), Disp: 60 each, Rfl: 2 .  albuterol (ACCUNEB) 1.25 MG/3ML nebulizer solution, Take 1 ampule by nebulization every 6 (six) hours as needed for wheezing., Disp: , Rfl:  .  Azelastine-Fluticasone 137-50 MCG/ACT SUSP, Place 1 spray into the nose 2 (two) times daily., Disp: 1 Bottle, Rfl: 0 .  cetirizine (ZYRTEC) 10 MG tablet, Take 1 tablet (10 mg total) by mouth daily., Disp: 30 tablet, Rfl: 3 .  diclofenac (VOLTAREN) 75 MG EC tablet, Take 1 tablet (75 mg total) by mouth 2 (two) times daily., Disp: 30 tablet, Rfl: 0 .  fluticasone (FLONASE) 50 MCG/ACT nasal spray, Place 2 sprays into both nostrils daily., Disp: , Rfl: 0 .  glycerin adult 2 g suppository, Place 1 suppository rectally as needed for constipation., Disp: , Rfl:  .  ibuprofen (ADVIL,MOTRIN) 400 MG tablet, Take 400 mg by mouth every 6 (six) hours as needed., Disp: , Rfl:  .  lactulose (CHRONULAC) 10 GM/15ML solution, Take 10 g by mouth 2 (two) times daily., Disp: , Rfl:  .  metoprolol tartrate (LOPRESSOR) 100 MG tablet, Take 1 tablet (100 mg total) by mouth 2 (two) times daily., Disp: 180 tablet, Rfl: 1 .  ondansetron (ZOFRAN) 4 MG tablet, Take 4 mg by mouth every 6 (six) hours as needed., Disp: ,  Rfl: 0 .  polyethylene glycol powder (GLYCOLAX/MIRALAX) powder, Take 17 g by mouth as needed., Disp: , Rfl: 0 .  predniSONE (DELTASONE) 5 MG tablet, take 2 tablets by mouth once daily with BREAKFAST, Disp: 60 tablet, Rfl: 1 .  PROAIR HFA 108 (90 BASE) MCG/ACT inhaler, Inhale 108 mcg into the lungs every 6 (six) hours as needed., Disp: , Rfl:  .  Probiotic Product (CULTURELLE IMMUNE DEFENSE PO), Take 1 capsule by mouth daily., Disp: , Rfl:  .  pseudoephedrine (SUDAFED) 60 MG tablet, Take 1 tablet (60 mg total) by mouth every 4 (four) hours as needed for congestion., Disp: 24 tablet, Rfl: 0 .  ranitidine (ZANTAC) 75 MG tablet, Take 75 mg by mouth 2 (two) times daily., Disp: , Rfl:  .  SAPHRIS 5 MG SUBL 24 hr tablet, Take 5 mg by mouth daily., Disp: , Rfl: 0 .  Vitamin D, Cholecalciferol, 1000 units TABS, Take 500 Units by mouth daily. , Disp: , Rfl:  .  voriconazole (VFEND) 200 MG tablet, Take 200 mg by mouth 2 (two) times daily., Disp: , Rfl:   Past Medical History: Past Medical History:  Diagnosis Date  . Anxiety   . Asthma   . Depression   . Fibromyalgia   . Incontinence   . Lymphadenopathy   . Pneumonia    bilateral community acquired   . PVC's (premature ventricular contractions)   .  Sarcoidosis   . Sepsis ([place])   . Shortness of breath dyspnea   . Tachycardia     Tobacco Use: Social History   Tobacco Use  Smoking Status Never Smoker  Smokeless Tobacco Never Used    Labs: Recent Review Flowsheet Data    There is no flowsheet data to display.       Pulmonary Assessment Scores: Pulmonary Assessment Scores    Row Name 03/21/18 1530         ADL UCSD   ADL Phase  Entry     SOB Score total  61     Rest  3     Walk  3     Stairs  4     Bath  2     Dress  3     Shop  4       CAT Score   CAT Score  23       mMRC Score   mMRC Score  2        Pulmonary Function Assessment: Pulmonary Function Assessment - 03/21/18 1531      Breath   Bilateral Breath  Sounds  Clear;Decreased    Shortness of Breath  Yes;Fear of Shortness of Breath;Panic with Shortness of Breath;Limiting activity       Exercise Target Goals:    Exercise Program Goal: Individual exercise prescription set using results from initial 6 min walk test and THRR while considering  patient's activity barriers and safety.    Exercise Prescription Goal: Initial exercise prescription builds to 30-45 minutes a day of aerobic activity, 2-3 days per week.  Home exercise guidelines will be given to patient during program as part of exercise prescription that the participant will acknowledge.  Activity Barriers & Risk Stratification: Activity Barriers & Cardiac Risk Stratification - 03/21/18 1533      Activity Barriers & Cardiac Risk Stratification   Activity Barriers  Deconditioning;Muscular Weakness;Shortness of Breath;Balance Concerns       6 Minute Walk: 6 Minute Walk    Row Name 03/21/18 1531         6 Minute Walk   Phase  Initial     Distance  1070 feet     Walk Time  6 minutes     # of Rest Breaks  0     MPH  2.02     METS  4.15     RPE  11     Perceived Dyspnea   3     VO2 Peak  14.53     Symptoms  Yes (comment)     Comments  SOB     Resting HR  99 bpm     Resting BP  126/64     Resting Oxygen Saturation   98 %     Exercise Oxygen Saturation  during 6 min walk  95 %     Max Ex. HR  132 bpm     Max Ex. BP  144/72     2 Minute Post BP  126/64       Interval HR   1 Minute HR  122     2 Minute HR  126     3 Minute HR  122     4 Minute HR  127     5 Minute HR  132     6 Minute HR  128     2 Minute Post HR  109     Interval Heart Rate?  Yes  Interval Oxygen   Interval Oxygen?  Yes     Baseline Oxygen Saturation %  98 %     1 Minute Oxygen Saturation %  97 %     1 Minute Liters of Oxygen  0 L Room Air     2 Minute Oxygen Saturation %  97 %     2 Minute Liters of Oxygen  0 L     3 Minute Oxygen Saturation %  97 %     3 Minute Liters of Oxygen   0 L     4 Minute Oxygen Saturation %  97 % 3:58 95%     4 Minute Liters of Oxygen  0 L     5 Minute Oxygen Saturation %  98 %     5 Minute Liters of Oxygen  0 L     6 Minute Oxygen Saturation %  97 %     6 Minute Liters of Oxygen  0 L     2 Minute Post Oxygen Saturation %  98 %     2 Minute Post Liters of Oxygen  0 L       Oxygen Initial Assessment: Oxygen Initial Assessment - 03/21/18 1539      Home Oxygen   Home Oxygen Device  None    Sleep Oxygen Prescription  None    Home Exercise Oxygen Prescription  None    Home at Rest Exercise Oxygen Prescription  None      Initial 6 min Walk   Oxygen Used  None      Program Oxygen Prescription   Program Oxygen Prescription  None      Intervention   Short Term Goals  To learn and demonstrate proper use of respiratory medications;To learn and demonstrate proper pursed lip breathing techniques or other breathing techniques.;To learn and understand importance of maintaining oxygen saturations>88%;To learn and understand importance of monitoring SPO2 with pulse oximeter and demonstrate accurate use of the pulse oximeter.    Long  Term Goals  Verbalizes importance of monitoring SPO2 with pulse oximeter and return demonstration;Maintenance of O2 saturations>88%;Exhibits proper breathing techniques, such as pursed lip breathing or other method taught during program session;Compliance with respiratory medication;Demonstrates proper use of MDI's       Oxygen Re-Evaluation:   Oxygen Discharge (Final Oxygen Re-Evaluation):   Initial Exercise Prescription: Initial Exercise Prescription - 03/21/18 1600      Date of Initial Exercise RX and Referring Provider   Date  03/21/18    Referring Provider  Flora Lipps MD      Treadmill   MPH  2    Grade  0    Minutes  1    METs  2.53      NuStep   Level  2    SPM  80    Minutes  15    METs  2.5      REL-XR   Level  1    Speed  50    Minutes  15    METs  2.5      Prescription Details     Frequency (times per week)  3    Duration  Progress to 45 minutes of aerobic exercise without signs/symptoms of physical distress      Intensity   THRR 40-80% of Max Heartrate  129-158    Ratings of Perceived Exertion  11-13    Perceived Dyspnea  0-4      Progression   Progression  Continue  to progress workloads to maintain intensity without signs/symptoms of physical distress.      Resistance Training   Training Prescription  Yes    Weight  3 lbs    Reps  10-15       Perform Capillary Blood Glucose checks as needed.  Exercise Prescription Changes: Exercise Prescription Changes    Row Name 03/21/18 1500             Response to Exercise   Blood Pressure (Admit)  126/64       Blood Pressure (Exercise)  144/72       Blood Pressure (Exit)  126/64       Heart Rate (Admit)  99 bpm       Heart Rate (Exercise)  132 bpm       Heart Rate (Exit)  109 bpm       Oxygen Saturation (Admit)  98 %       Oxygen Saturation (Exercise)  95 %       Oxygen Saturation (Exit)  98 %       Rating of Perceived Exertion (Exercise)  11       Perceived Dyspnea (Exercise)  3       Symptoms  SOB       Comments  walk test results          Exercise Comments: Exercise Comments    Row Name 03/30/18 1024           Exercise Comments   First full day of exercise!  Patient was oriented to gym and equipment including functions, settings, policies, and procedures.  Patient's individual exercise prescription and treatment plan were reviewed.  All starting workloads were established based on the results of the 6 minute walk test done at initial orientation visit.  The plan for exercise progression was also introduced and progression will be customized based on patient's performance and goals.          Exercise Goals and Review: Exercise Goals    Row Name 03/21/18 1534             Exercise Goals   Increase Physical Activity  Yes       Intervention  Provide advice, education, support and  counseling about physical activity/exercise needs.;Develop an individualized exercise prescription for aerobic and resistive training based on initial evaluation findings, risk stratification, comorbidities and participant's personal goals.       Expected Outcomes  Short Term: Attend rehab on a regular basis to increase amount of physical activity.;Long Term: Add in home exercise to make exercise part of routine and to increase amount of physical activity.;Long Term: Exercising regularly at least 3-5 days a week.       Increase Strength and Stamina  Yes       Intervention  Provide advice, education, support and counseling about physical activity/exercise needs.;Develop an individualized exercise prescription for aerobic and resistive training based on initial evaluation findings, risk stratification, comorbidities and participant's personal goals.       Expected Outcomes  Short Term: Increase workloads from initial exercise prescription for resistance, speed, and METs.;Short Term: Perform resistance training exercises routinely during rehab and add in resistance training at home;Long Term: Improve cardiorespiratory fitness, muscular endurance and strength as measured by increased METs and functional capacity (6MWT)       Able to understand and use rate of perceived exertion (RPE) scale  Yes       Intervention  Provide education and explanation on how to  use RPE scale       Expected Outcomes  Short Term: Able to use RPE daily in rehab to express subjective intensity level;Long Term:  Able to use RPE to guide intensity level when exercising independently       Able to understand and use Dyspnea scale  Yes       Intervention  Provide education and explanation on how to use Dyspnea scale       Expected Outcomes  Short Term: Able to use Dyspnea scale daily in rehab to express subjective sense of shortness of breath during exertion;Long Term: Able to use Dyspnea scale to guide intensity level when exercising  independently       Knowledge and understanding of Target Heart Rate Range (THRR)  Yes       Intervention  Provide education and explanation of THRR including how the numbers were predicted and where they are located for reference       Expected Outcomes  Short Term: Able to state/look up THRR;Short Term: Able to use daily as guideline for intensity in rehab;Long Term: Able to use THRR to govern intensity when exercising independently       Able to check pulse independently  Yes       Intervention  Provide education and demonstration on how to check pulse in carotid and radial arteries.;Review the importance of being able to check your own pulse for safety during independent exercise       Expected Outcomes  Short Term: Able to explain why pulse checking is important during independent exercise;Long Term: Able to check pulse independently and accurately       Understanding of Exercise Prescription  Yes       Intervention  Provide education, explanation, and written materials on patient's individual exercise prescription       Expected Outcomes  Short Term: Able to explain program exercise prescription;Long Term: Able to explain home exercise prescription to exercise independently          Exercise Goals Re-Evaluation : Exercise Goals Re-Evaluation    Row Name 03/30/18 1024             Exercise Goal Re-Evaluation   Exercise Goals Review  Increase Physical Activity;Increase Strength and Stamina;Able to understand and use rate of perceived exertion (RPE) scale;Able to understand and use Dyspnea scale       Comments  Reviewed RPE scale, THR and program prescription with pt today.  Pt voiced understanding and was given a copy of goals to take home.        Expected Outcomes  Short: Use RPE daily to regulate intensity. Long: Follow program prescription in THR.          Discharge Exercise Prescription (Final Exercise Prescription Changes): Exercise Prescription Changes - 03/21/18 1500       Response to Exercise   Blood Pressure (Admit)  126/64    Blood Pressure (Exercise)  144/72    Blood Pressure (Exit)  126/64    Heart Rate (Admit)  99 bpm    Heart Rate (Exercise)  132 bpm    Heart Rate (Exit)  109 bpm    Oxygen Saturation (Admit)  98 %    Oxygen Saturation (Exercise)  95 %    Oxygen Saturation (Exit)  98 %    Rating of Perceived Exertion (Exercise)  11    Perceived Dyspnea (Exercise)  3    Symptoms  SOB    Comments  walk test results  Nutrition:  Target Goals: Understanding of nutrition guidelines, daily intake of sodium <1558m, cholesterol <2080m calories 30% from fat and 7% or less from saturated fats, daily to have 5 or more servings of fruits and vegetables.  Biometrics: Pre Biometrics - 03/21/18 1534      Pre Biometrics   Height  4' 11.4" (1.509 m)    Weight  125 lb 3.2 oz (56.8 kg)    Waist Circumference  28 inches    Hip Circumference  37 inches    Waist to Hip Ratio  0.76 %    BMI (Calculated)  24.94    Single Leg Stand  3.8 seconds        Nutrition Therapy Plan and Nutrition Goals: Nutrition Therapy & Goals - 03/21/18 1527      Personal Nutrition Goals   Nutrition Goal  Meet with the dietician    Personal Goal #2  Lose some weight    Personal Goal #3  come up with a budget for eating    Comments  Cost is a factor in her diet. She cant afford much and wants to learn how to eat better on a budget.      Intervention Plan   Intervention  Prescribe, educate and counsel regarding individualized specific dietary modifications aiming towards targeted core components such as weight, hypertension, lipid management, diabetes, heart failure and other comorbidities.;Nutrition handout(s) given to patient.    Expected Outcomes  Short Term Goal: Understand basic principles of dietary content, such as calories, fat, sodium, cholesterol and nutrients.;Long Term Goal: Adherence to prescribed nutrition plan.       Nutrition Assessments: Nutrition  Assessments - 03/21/18 1523      MEDFICTS Scores   Pre Score  39       Nutrition Goals Re-Evaluation:   Nutrition Goals Discharge (Final Nutrition Goals Re-Evaluation):   Psychosocial: Target Goals: Acknowledge presence or absence of significant depression and/or stress, maximize coping skills, provide positive support system. Participant is able to verbalize types and ability to use techniques and skills needed for reducing stress and depression.   Initial Review & Psychosocial Screening: Initial Psych Review & Screening - 03/21/18 1520      Initial Review   Current issues with  Current Psychotropic Meds;Current Depression;Current Sleep Concerns;Current Stress Concerns;History of Depression    Source of Stress Concerns  Chronic Illness;Financial;Poor Coping Skills;Unable to participate in former interests or hobbies    Comments  Her lung disease and financial are the main concerns of her drepression.      Family Dynamics   Good Support System?  No    Strains  Intra-family strains    Comments  She does not rely on family for support but can talk to her boyfriend for support.      Barriers   Psychosocial barriers to participate in program  The patient should benefit from training in stress management and relaxation.;Psychosocial barriers identified (see note)      Screening Interventions   Interventions  Encouraged to exercise;To provide support and resources with identified psychosocial needs;Program counselor consult;Provide feedback about the scores to participant    Expected Outcomes  Short Term goal: Utilizing psychosocial counselor, staff and physician to assist with identification of specific Stressors or current issues interfering with healing process. Setting desired goal for each stressor or current issue identified.;Long Term Goal: Stressors or current issues are controlled or eliminated.;Short Term goal: Identification and review with participant of any Quality of Life or  Depression concerns found by scoring  the questionnaire.;Long Term goal: The participant improves quality of Life and PHQ9 Scores as seen by post scores and/or verbalization of changes       Quality of Life Scores:  Scores of 19 and below usually indicate a poorer quality of life in these areas.  A difference of  2-3 points is a clinically meaningful difference.  A difference of 2-3 points in the total score of the Quality of Life Index has been associated with significant improvement in overall quality of life, self-image, physical symptoms, and general health in studies assessing change in quality of life.  PHQ-9: Recent Review Flowsheet Data    Depression screen [place] 2/9 03/21/2018 12/24/2017   Decreased Interest 1 2   Down, Depressed, Hopeless 2 2   PHQ - 2 Score 3 4   Altered sleeping 2 2   Tired, decreased energy 2 2   Change in appetite 2 0   Feeling bad or failure about yourself  2 1   Trouble concentrating 3 2   Moving slowly or fidgety/restless 0 2   Suicidal thoughts 0 0   PHQ-9 Score 14 13   Difficult doing work/chores Somewhat difficult Somewhat difficult      Interpretation of Total Score  Total Score Depression Severity:  1-4 = Minimal depression, 5-9 = Mild depression, 10-14 = Moderate depression, 15-19 = Moderately severe depression, 20-27 = Severe depression   Psychosocial Evaluation and Intervention: Psychosocial Evaluation - 03/30/18 1112      Psychosocial Evaluation & Interventions   Interventions  Encouraged to exercise with the program and follow exercise prescription;Stress management education;Relaxation education    Comments  Counselor met with Ms. Danelle Earthly  Art) today for initial psychosocial evaluation.  She is a 47 year old who reports having a "hole in her lungs" subsequent to double pneumonia ~1 year ago.  Ashlei has a limited support system with a boyfriend and a friend and reports she has a sister locally but most of her family relationships are  conflictual.  Cherylanne has (2) children that she states are "distant" from her.  She reports not sleeping well (maybe 4 hours per night for a long time) and her appetite "sucks!"  Heli has a history of depression and anxiety and is on medication for the past 5 months that she reports is helpful. Letisia has multiple stressors with her health; finances and conflict in her family relationships.  She has goals to sleep better; complete this program; and breathe better.  Counselor will be following with her.    Expected Outcomes  Short:  Archana will attend class and exercise consistently to begin to breathe better and hopefully sleep better.  She will contact her Dr. about a possible sleep study if this does not improve soon.  Long:  Afton will develop a routine of exercise for stress and her overall health.    Continue Psychosocial Services   Follow up required by counselor       Psychosocial Re-Evaluation:   Psychosocial Discharge (Final Psychosocial Re-Evaluation):   Education: Education Goals: Education classes will be provided on a weekly basis, covering required topics. Participant will state understanding/return demonstration of topics presented.  Learning Barriers/Preferences: Learning Barriers/Preferences - 03/21/18 1531      Learning Barriers/Preferences   Learning Barriers  Sight    Learning Preferences  None       Education Topics:  Initial Evaluation Education: - Verbal, written and demonstration of respiratory meds, oximetry and breathing techniques. Instruction on use of nebulizers and MDIs  and importance of monitoring MDI activations.   Pulmonary Rehab from 03/21/2018 in [facility] Cardiac and Pulmonary Rehab  Date  03/21/18  Educator  [facility]  Instruction Review Code  1- Verbalizes Understanding      General Nutrition Guidelines/Fats and Fiber: -Group instruction provided by verbal, written material, models and posters to present the general guidelines for heart healthy nutrition.  Gives an explanation and review of dietary fats and fiber.   Controlling Sodium/Reading Food Labels: -Group verbal and written material supporting the discussion of sodium use in heart healthy nutrition. Review and explanation with models, verbal and written materials for utilization of the food label.   Exercise Physiology & General Exercise Guidelines: - Group verbal and written instruction with models to review the exercise physiology of the cardiovascular system and associated critical values. Provides general exercise guidelines with specific guidelines to those with heart or lung disease.    Aerobic Exercise & Resistance Training: - Gives group verbal and written instruction on the various components of exercise. Focuses on aerobic and resistive training programs and the benefits of this training and how to safely progress through these programs.   Flexibility, Balance, Mind/Body Relaxation: Provides group verbal/written instruction on the benefits of flexibility and balance training, including mind/body exercise modes such as yoga, pilates and tai chi.  Demonstration and skill practice provided.   Stress and Anxiety: - Provides group verbal and written instruction about the health risks of elevated stress and causes of high stress.  Discuss the correlation between heart/lung disease and anxiety and treatment options. Review healthy ways to manage with stress and anxiety.   Depression: - Provides group verbal and written instruction on the correlation between heart/lung disease and depressed mood, treatment options, and the stigmas associated with seeking treatment.   Exercise & Equipment Safety: - Individual verbal instruction and demonstration of equipment use and safety with use of the equipment.   Pulmonary Rehab from 03/21/2018 in [facility] Cardiac and Pulmonary Rehab  Date  03/21/18  Educator  [facility]  Instruction Review Code  1- Verbalizes Understanding      Infection  Prevention: - Provides verbal and written material to individual with discussion of infection control including proper hand washing and proper equipment cleaning during exercise session.   Pulmonary Rehab from 03/21/2018 in [facility] Cardiac and Pulmonary Rehab  Date  03/21/18  Educator  [facility]  Instruction Review Code  1- Verbalizes Understanding      Falls Prevention: - Provides verbal and written material to individual with discussion of falls prevention and safety.   Pulmonary Rehab from 03/21/2018 in [facility] Cardiac and Pulmonary Rehab  Date  03/21/18  Educator  [facility]  Instruction Review Code  1- Verbalizes Understanding      Diabetes: - Individual verbal and written instruction to review signs/symptoms of diabetes, desired ranges of glucose level fasting, after meals and with exercise. Advice that pre and post exercise glucose checks will be done for 3 sessions at entry of program.   Chronic Lung Diseases: - Group verbal and written instruction to review updates, respiratory medications, advancements in procedures and treatments. Discuss use of supplemental oxygen including available portable oxygen systems, continuous and intermittent flow rates, concentrators, personal use and safety guidelines. Review proper use of inhaler and spacers. Provide informative websites for self-education.    Energy Conservation: - Provide group verbal and written instruction for methods to conserve energy, plan and organize activities. Instruct on pacing techniques, use of adaptive equipment and posture/positioning to relieve shortness of breath.  Triggers and Exacerbations: - Group verbal and written instruction to review types of environmental triggers and ways to prevent exacerbations. Discuss weather changes, air quality and the benefits of nasal washing. Review warning signs and symptoms to help prevent infections. Discuss techniques for effective airway clearance, coughing, and vibrations.   AED/CPR: -  Group verbal and written instruction with the use of models to demonstrate the basic use of the AED with the basic ABC's of resuscitation.   Anatomy and Physiology of the Lungs: - Group verbal and written instruction with the use of models to provide basic lung anatomy and physiology related to function, structure and complications of lung disease.   Anatomy & Physiology of the Heart: - Group verbal and written instruction and models provide basic cardiac anatomy and physiology, with the coronary electrical and arterial systems. Review of Valvular disease and Heart Failure   Cardiac Medications: - Group verbal and written instruction to review commonly prescribed medications for heart disease. Reviews the medication, class of the drug, and side effects.   Know Your Numbers and Risk Factors: -Group verbal and written instruction about important numbers in your health.  Discussion of what are risk factors and how they play a role in the disease process.  Review of Cholesterol, Blood Pressure, Diabetes, and BMI and the role they play in your overall health.   Sleep Hygiene: -Provides group verbal and written instruction about how sleep can affect your health.  Define sleep hygiene, discuss sleep cycles and impact of sleep habits. Review good sleep hygiene tips.    Other: -Provides group and verbal instruction on various topics (see comments)    Knowledge Questionnaire Score: Knowledge Questionnaire Score - 03/21/18 1532      Knowledge Questionnaire Score   Pre Score  10/18 reviewed with patient        Core Components/Risk Factors/Patient Goals at Admission: Personal Goals and Risk Factors at Admission - 03/21/18 1540      Core Components/Risk Factors/Patient Goals on Admission    Weight Management  Yes;Weight Loss    Intervention  Weight Management: Develop a combined nutrition and exercise program designed to reach desired caloric intake, while maintaining appropriate intake of  nutrient and fiber, sodium and fats, and appropriate energy expenditure required for the weight goal.;Weight Management: Provide education and appropriate resources to help participant work on and attain dietary goals.;Weight Management/Obesity: Establish reasonable short term and long term weight goals.    Admit Weight  125 lb 8 oz (56.9 kg)    Goal Weight: Short Term  120 lb (54.4 kg)    Goal Weight: Long Term  120 lb (54.4 kg)    Expected Outcomes  Short Term: Continue to assess and modify interventions until short term weight is achieved;Long Term: Adherence to nutrition and physical activity/exercise program aimed toward attainment of established weight goal;Weight Maintenance: Understanding of the daily nutrition guidelines, which includes 25-35% calories from fat, 7% or less cal from saturated fats, less than 245m cholesterol, less than 1.5gm of sodium, & 5 or more servings of fruits and vegetables daily;Weight Loss: Understanding of general recommendations for a balanced deficit meal plan, which promotes 1-2 lb weight loss per week and includes a negative energy balance of [phone] kcal/d;Understanding recommendations for meals to include 15-35% energy as protein, 25-35% energy from fat, 35-60% energy from carbohydrates, less than 2073mof dietary cholesterol, 20-35 gm of total fiber daily;Understanding of distribution of calorie intake throughout the day with the consumption of 4-5 meals/snacks  Improve shortness of breath with ADL's  Yes    Intervention  Provide education, individualized exercise plan and daily activity instruction to help decrease symptoms of SOB with activities of daily living.    Expected Outcomes  Short Term: Improve cardiorespiratory fitness to achieve a reduction of symptoms when performing ADLs;Long Term: Be able to perform more ADLs without symptoms or delay the onset of symptoms    Hypertension  Yes    Intervention  Provide education on lifestyle modifcations including  regular physical activity/exercise, weight management, moderate sodium restriction and increased consumption of fresh fruit, vegetables, and low fat dairy, alcohol moderation, and smoking cessation.;Monitor prescription use compliance.    Expected Outcomes  Short Term: Continued assessment and intervention until BP is < 140/80m HG in hypertensive participants. < 130/858mHG in hypertensive participants with diabetes, heart failure or chronic kidney disease.;Long Term: Maintenance of blood pressure at goal levels.    Lipids  Yes    Intervention  Provide education and support for participant on nutrition & aerobic/resistive exercise along with prescribed medications to achieve LDL <7023mHDL >43m72m  Expected Outcomes  Short Term: Participant states understanding of desired cholesterol values and is compliant with medications prescribed. Participant is following exercise prescription and nutrition guidelines.;Long Term: Cholesterol controlled with medications as prescribed, with individualized exercise RX and with personalized nutrition plan. Value goals: LDL < 70mg96mL > 40 mg.       Core Components/Risk Factors/Patient Goals Review:    Core Components/Risk Factors/Patient Goals at Discharge (Final Review):    ITP Comments: ITP Comments    Row Name 03/21/18 1418 04/04/18 0854         ITP Comments  Medical Evaluation completed. Chart sent for review and changes to Dr. Mark Emily Filbertctor of [facility]osis can be found in [facility] encounter 02/14/2018  30 day review completed. ITP sent to Dr. Mark Emily Filbertctor of [facility] with ITP unless changes are made by physician         Comments: 30 day review

## 2018-04-06 ENCOUNTER — Encounter: Payer: Self-pay | Admitting: Gastroenterology

## 2018-04-06 ENCOUNTER — Ambulatory Visit (INDEPENDENT_AMBULATORY_CARE_PROVIDER_SITE_OTHER): Payer: Medicaid Other | Admitting: Gastroenterology

## 2018-04-06 VITALS — BP 122/77 | HR 104 | Ht <= 58 in | Wt 126.6 lb

## 2018-04-06 DIAGNOSIS — R1319 Other dysphagia: Secondary | ICD-10-CM

## 2018-04-06 DIAGNOSIS — R1310 Dysphagia, unspecified: Secondary | ICD-10-CM | POA: Diagnosis not present

## 2018-04-06 DIAGNOSIS — K219 Gastro-esophageal reflux disease without esophagitis: Secondary | ICD-10-CM | POA: Diagnosis not present

## 2018-04-06 DIAGNOSIS — K5900 Constipation, unspecified: Secondary | ICD-10-CM | POA: Diagnosis not present

## 2018-04-06 MED ORDER — OMEPRAZOLE 40 MG PO CPDR: 40.0000 mg | DELAYED_RELEASE_CAPSULE | Freq: Every day | ORAL | 3 refills | Status: DC

## 2018-04-06 NOTE — Addendum Note (Signed)
Addended by: Jackquline DenmarkIDGEWAY, Kenedy Haisley W on: 04/06/2018 10:37 AM   Modules accepted: Orders, SmartSet

## 2018-04-06 NOTE — Progress Notes (Signed)
Jennifer MoodKiran Graham Doukas [name], MRCP(U.K) [address]  [address]  [place], [place] [address]  Main: [phone]  Fax: [phone]   Gastroenterology Consultation  Referring Provider:     Judeen HammansSoles, Meredith Key, [name] Primary Care Physician:  Jennifer [name] Primary Gastroenterologist:  Jennifer [name]  Reason for Consultation:     GERD         HPI:   Jennifer [name] is a 47 y.o. y/o female referred for consultation & management  by Dr. Sallee LangeSoles, Willaim RayasMeredith Key, [name].   She has referred for GERD. She has a history of sarcoidosis.CT scan of the chest in 03/2016 showed pulmonary involvement with sarcoidosis   She says that she has had reflux for a few years, unsure if it has got worse. She describes her symptoms are allergic, during march when the pollen begins, could not eat and swallow , foods such as chicken get stuck ,points to her chest , goes down eventually. Felt like she was choking . Has heartburn , on and off 3 times a week , during the day , nocturnal cough. Worse when she lays down. For her reflux she has been taking Zantac , once a day , does not completely control her symptoms. Helps with her swallowing. Does not smoke. Mother had esophageal cancer and she was a smoker. She has lost some weight unintentionally, 5 lbs recently which she attributes to "seasonal".   She also suffers from constipation. She says ongoing since she had kids for over 20 years. Never had a colonoscopy. She has a bowel movement once every few days, softer recently. Lots of pain before bowel movement. Not much fruit or vegetables in her diet. Unsure of colon cancer history in the family. No blood in her stool at this time but has seen it in the past. `   Past Medical History:  Diagnosis Date  . Anxiety   . Asthma   . Depression   . Fibromyalgia   . Incontinence   . Lymphadenopathy   . Pneumonia    bilateral community acquired   . PVC's (premature ventricular contractions)   . Sarcoidosis   . Sepsis (HCC)     . Shortness of breath dyspnea   . Tachycardia     Past Surgical History:  Procedure Laterality Date  . CESAREAN SECTION     1992/1994  . ENDOBRONCHIAL ULTRASOUND N/A 01/27/2016   Procedure: ENDOBRONCHIAL ULTRASOUND;  Surgeon: Jennifer [name];  Location: [facility];  Service: Cardiopulmonary;  Laterality: N/A;  . LYMPH NODE BIOPSY    . VIDEO BRONCHOSCOPY N/A 01/27/2016   Procedure: VIDEO BRONCHOSCOPY WITH FLUORO;  Surgeon: Jennifer [name];  Location: [facility];  Service: Cardiopulmonary;  Laterality: N/A;    Prior to Admission medications   Medication Sig Start Date End Date Taking? Authorizing Provider  acetaminophen (TYLENOL) 500 MG tablet Take 500 mg by mouth every 6 (six) hours as needed.    Provider, Historical, [name]  ADVAIR DISKUS 250-50 MCG/DOSE AEPB Inhale 1 puff into the lungs as directed. Patient taking differently: Inhale 1 puff into the lungs 2 (two) times daily.  03/27/15   Mungal, Eston EstersVishal, [name]  albuterol (ACCUNEB) 1.25 MG/3ML nebulizer solution Take 1 ampule by nebulization every 6 (six) hours as needed for wheezing.    Provider, Historical, [name]  Azelastine-Fluticasone 137-50 MCG/ACT SUSP Place 1 spray into the nose 2 (two) times daily. 01/19/17   Jennifer [name]  cetirizine (ZYRTEC) 10 MG tablet Take 1 tablet (10 mg total)  by mouth daily. 01/19/17   Jennifer Fulling, [name]  diclofenac (VOLTAREN) 75 MG EC tablet Take 1 tablet (75 mg total) by mouth 2 (two) times daily. 09/26/15   Jennifer [name]  fluticasone (FLONASE) 50 MCG/ACT nasal spray Place 2 sprays into both nostrils daily. 12/04/14   Provider, Historical, [name]  glycerin adult 2 g suppository Place 1 suppository rectally as needed for constipation.    Provider, Historical, [name]  ibuprofen (ADVIL,MOTRIN) 400 MG tablet Take 400 mg by mouth every 6 (six) hours as needed.    Provider, Historical, [name]  lactulose (CHRONULAC) 10 GM/15ML solution Take 10 g by mouth 2 (two) times daily.    Provider, Historical, [name]  metoprolol tartrate  (LOPRESSOR) 100 MG tablet Take 1 tablet (100 mg total) by mouth 2 (two) times daily. 03/09/18   Jennifer [name]  ondansetron (ZOFRAN) 4 MG tablet Take 4 mg by mouth every 6 (six) hours as needed. 12/04/14   Provider, Historical, [name]  polyethylene glycol powder (GLYCOLAX/MIRALAX) powder Take 17 g by mouth as needed. 12/04/14   Provider, Historical, [name]  predniSONE (DELTASONE) 5 MG tablet take 2 tablets by mouth once daily with BREAKFAST 02/16/18   Jennifer Fulling, [name]  PROAIR HFA 108 (90 BASE) MCG/ACT inhaler Inhale 108 mcg into the lungs every 6 (six) hours as needed. 12/05/14   Provider, Historical, [name]  Probiotic Product (CULTURELLE IMMUNE DEFENSE PO) Take 1 capsule by mouth daily.    Provider, Historical, [name]  pseudoephedrine (SUDAFED) 60 MG tablet Take 1 tablet (60 mg total) by mouth every 4 (four) hours as needed for congestion. 11/28/15   Jennifer [name]  ranitidine (ZANTAC) 75 MG tablet Take 75 mg by mouth 2 (two) times daily.    Provider, Historical, [name]  SAPHRIS 5 MG SUBL 24 hr tablet Take 5 mg by mouth daily. 05/14/17   Provider, Historical, [name]  Vitamin D, Cholecalciferol, 1000 units TABS Take 500 Units by mouth daily.     Provider, Historical, [name]  voriconazole (VFEND) 200 MG tablet Take 200 mg by mouth 2 (two) times daily. 02/09/17   Provider, Historical, [name]    Family History  Problem Relation Age of Onset  . Lung cancer Mother      Social History   Tobacco Use  . Smoking status: Never Smoker  . Smokeless tobacco: Never Used  Substance Use Topics  . Alcohol use: No    Alcohol/week: 0.0 oz  . Drug use: No    Allergies as of 04/06/2018 - Review Complete 03/09/2018  Allergen Reaction Noted  . Penicillin g Hives 11/22/2014    Review of Systems:    All systems reviewed and negative except where noted in HPI.   Physical Exam:  There were no vitals taken for this visit. No LMP recorded. Psych:  Alert and cooperative. Normal mood and affect. General:   Alert,  Well-developed,  well-nourished, pleasant and cooperative in NAD Head:  Normocephalic and atraumatic. Eyes:  Sclera clear, no icterus.   Conjunctiva pink. Ears:  Normal auditory acuity. Nose:  No deformity, discharge, or lesions. Mouth:  No deformity or lesions,oropharynx pink & moist. Neck:  Supple; no masses or thyromegaly. Lungs:  Respirations even and unlabored.  Clear throughout to auscultation.   No wheezes, crackles, or rhonchi. No acute distress. Heart:  Regular rate and rhythm; no murmurs, clicks, rubs, or gallops. Abdomen:  Normal bowel sounds.  No bruits.  Soft, non-tender and non-distended without masses, hepatosplenomegaly or hernias noted.  No guarding or  rebound tenderness.    Neurologic:  Alert and oriented x3;  grossly normal neurologically. Skin:  Intact without significant lesions or rashes. No jaundice. Lymph Nodes:  No significant cervical adenopathy. Psych:  Alert and cooperative. Normal mood and affect.  Imaging Studies: No results found.  Assessment and Plan:   REENA BORROMEO is a 47 y.o. y/o female has been referred for GERD. She also has dysphagia which is seasonal. She may have EOE. Or Dysphagia secondary to GERD . Also has long standing constipation    Plan  1. Stop Zantac, commence on Prilosec 40 mg once a day  2. EGD+/- dilation - r/o EOE. Presently has a bad cough - will avoid EGD for a few weeks and wait for this acute episode to resolve.  3. GERD patient information. Counseled on life style changes.  4. Pulmonary clearance for procedures as she has significant cough  5. Trial of linzess 290 mcg - 2 weeks samples provided. High fiber diet patient information   I have discussed alternative options, risks & benefits,  which include, but are not limited to, bleeding, infection, perforation,respiratory complication & drug reaction.  The patient agrees with this plan & written consent will be obtained.    Follow up in 8 weeks   Dr Jennifer Mood [name],MRCP(U.K)

## 2018-04-06 NOTE — Patient Instructions (Signed)
High-Fiber Diet Fiber, also called dietary fiber, is a type of carbohydrate found in fruits, vegetables, whole grains, and beans. A high-fiber diet can have many health benefits. Your health care provider may recommend a high-fiber diet to help:  Prevent constipation. Fiber can make your bowel movements more regular.  Lower your cholesterol.  Relieve hemorrhoids, uncomplicated diverticulosis, or irritable bowel syndrome.  Prevent overeating as part of a weight-loss plan.  Prevent heart disease, type 2 diabetes, and certain cancers.  What is my plan? The recommended daily intake of fiber includes:  38 grams for men under age 50.  30 grams for men over age 50.  25 grams for women under age 50.  21 grams for women over age 50.  You can get the recommended daily intake of dietary fiber by eating a variety of fruits, vegetables, grains, and beans. Your health care provider may also recommend a fiber supplement if it is not possible to get enough fiber through your diet. What do I need to know about a high-fiber diet?  Fiber supplements have not been widely studied for their effectiveness, so it is better to get fiber through food sources.  Always check the fiber content on thenutrition facts label of any prepackaged food. Look for foods that contain at least 5 grams of fiber per serving.  Ask your dietitian if you have questions about specific foods that are related to your condition, especially if those foods are not listed in the following section.  Increase your daily fiber consumption gradually. Increasing your intake of dietary fiber too quickly may cause bloating, cramping, or gas.  Drink plenty of water. Water helps you to digest fiber. What foods can I eat? Grains Whole-grain breads. Multigrain cereal. Oats and oatmeal. Brown rice. Barley. Bulgur wheat. Millet. Bran muffins. Popcorn. Rye wafer crackers. Vegetables Sweet potatoes. Spinach. Kale. Artichokes. Cabbage.  Broccoli. Green peas. Carrots. Squash. Fruits Berries. Pears. Apples. Oranges. Avocados. Prunes and raisins. Dried figs. Meats and Other Protein Sources Navy, kidney, pinto, and soy beans. Split peas. Lentils. Nuts and seeds. Dairy Fiber-fortified yogurt. Beverages Fiber-fortified soy milk. Fiber-fortified orange juice. Other Fiber bars. The items listed above may not be a complete list of recommended foods or beverages. Contact your dietitian for more options. What foods are not recommended? Grains White bread. Pasta made with refined flour. White rice. Vegetables Fried potatoes. Canned vegetables. Well-cooked vegetables. Fruits Fruit juice. Cooked, strained fruit. Meats and Other Protein Sources Fatty cuts of meat. Fried poultry or fried fish. Dairy Milk. Yogurt. Cream cheese. Sour cream. Beverages Soft drinks. Other Cakes and pastries. Butter and oils. The items listed above may not be a complete list of foods and beverages to avoid. Contact your dietitian for more information. What are some tips for including high-fiber foods in my diet?  Eat a wide variety of high-fiber foods.  Make sure that half of all grains consumed each day are whole grains.  Replace breads and cereals made from refined flour or white flour with whole-grain breads and cereals.  Replace white rice with brown rice, bulgur wheat, or millet.  Start the day with a breakfast that is high in fiber, such as a cereal that contains at least 5 grams of fiber per serving.  Use beans in place of meat in soups, salads, or pasta.  Eat high-fiber snacks, such as berries, raw vegetables, nuts, or popcorn. This information is not intended to replace advice given to you by your health care provider. Make sure you discuss any   questions you have with your health care provider. Document Released: 08/24/2005 Document Revised: 01/30/2016 Document Reviewed: 02/06/2014 Elsevier Interactive Patient Education  2018  Elsevier Inc[employer]  Gastroesophageal Reflux Disease, Adult Normally, food travels down the esophagus and stays in the stomach to be digested. If a person has gastroesophageal reflux disease (GERD), food and stomach acid move back up into the esophagus. When this happens, the esophagus becomes sore and swollen (inflamed). Over time, GERD can make small holes (ulcers) in the lining of the esophagus. Follow these instructions at home: Diet  Follow a diet as told by your doctor. You may need to avoid foods and drinks such as: ? Coffee and tea (with or without caffeine). ? Drinks that contain alcohol. ? Energy drinks and sports drinks. ? Carbonated drinks or sodas. ? Chocolate and cocoa. ? Peppermint and mint flavorings. ? Garlic and onions. ? Horseradish. ? Spicy and acidic foods, such as peppers, chili powder, curry powder, vinegar, hot sauces, and BBQ sauce. ? Citrus fruit juices and citrus fruits, such as oranges, lemons, and limes. ? Tomato-based foods, such as red sauce, chili, salsa, and pizza with red sauce. ? Fried and fatty foods, such as donuts, french fries, potato chips, and high-fat dressings. ? High-fat meats, such as hot dogs, rib eye steak, sausage, ham, and bacon. ? High-fat dairy items, such as whole milk, butter, and cream cheese.  Eat small meals often. Avoid eating large meals.  Avoid drinking large amounts of liquid with your meals.  Avoid eating meals during the 2-3 hours before bedtime.  Avoid lying down right after you eat.  Do not exercise right after you eat. General instructions  Pay attention to any changes in your symptoms.  Take over-the-counter and prescription medicines only as told by your doctor. Do not take aspirin, ibuprofen, or other NSAIDs unless your doctor says it is okay.  Do not use any tobacco products, including cigarettes, chewing tobacco, and e-cigarettes. If you need help quitting, ask your doctor.  Wear loose clothes. Do not wear  anything tight around your waist.  Raise (elevate) the head of your bed about 6 inches (15 cm).  Try to lower your stress. If you need help doing this, ask your doctor.  If you are overweight, lose an amount of weight that is healthy for you. Ask your doctor about a safe weight loss goal.  Keep all follow-up visits as told by your doctor. This is important. Contact a doctor if:  You have new symptoms.  You lose weight and you do not know why it is happening.  You have trouble swallowing, or it hurts to swallow.  You have wheezing or a cough that keeps happening.  Your symptoms do not get better with treatment.  You have a hoarse voice. Get help right away if:  You have pain in your arms, neck, jaw, teeth, or back.  You feel sweaty, dizzy, or light-headed.  You have chest pain or shortness of breath.  You throw up (vomit) and your throw up looks like blood or coffee grounds.  You pass out (faint).  Your poop (stool) is bloody or black.  You cannot swallow, drink, or eat. This information is not intended to replace advice given to you by your health care provider. Make sure you discuss any questions you have with your health care provider. Document Released: 02/10/2008 Document Revised: 01/30/2016 Document Reviewed: 12/19/2014 Elsevier Interactive Patient Education  2018 Elsevier Inc.  

## 2018-04-08 ENCOUNTER — Encounter: Payer: Medicaid Other | Attending: Family Medicine

## 2018-04-08 DIAGNOSIS — R635 Abnormal weight gain: Secondary | ICD-10-CM | POA: Insufficient documentation

## 2018-04-11 ENCOUNTER — Encounter: Payer: Medicaid Other | Admitting: *Deleted

## 2018-04-11 DIAGNOSIS — D869 Sarcoidosis, unspecified: Secondary | ICD-10-CM

## 2018-04-11 DIAGNOSIS — R635 Abnormal weight gain: Secondary | ICD-10-CM | POA: Diagnosis present

## 2018-04-11 NOTE — Progress Notes (Signed)
Daily Session Note  Patient Details  Name: Jennifer [name] MRN: [id number] Date of Birth: 07/30/1971 Referring Provider:     Pulmonary Rehab from 03/21/2018 in [facility]  Referring Provider  Kasa, Kurian MD      Encounter Date: 04/11/2018  Check In: Session Check In - 04/11/18 1032      Check-In   Supervising physician immediately available to respond to emergencies  [facility] immediately available ER MD    Physician(s)  Dr. Stafford and Dr. Kinner    Location  [facility]    Staff Present  Joseph Hood RCP,RRT,BSRT; , BS, ACSM CEP, Exercise Physiologist;Amanda Sommer, BA, ACSM CEP, Exercise Physiologist    Medication changes reported      No    Fall or balance concerns reported     No    Tobacco Cessation  No Change    Warm-up and Cool-down  Performed as group-led instruction    Resistance Training Performed  Yes    VAD Patient?  No    PAD/SET Patient?  No      Pain Assessment   Currently in Pain?  No/denies    Multiple Pain Sites  No          Social History   Tobacco Use  Smoking Status Never Smoker  Smokeless Tobacco Never Used    Goals Met:  Proper associated with RPD/PD & O2 Sat Independence with exercise equipment Exercise tolerated well Personal goals reviewed No report of cardiac concerns or symptoms Strength training completed today  Goals Unmet:  Not Applicable  Comments: Pt able to follow exercise prescription today without complaint.  Will continue to monitor for progression. Reviewed home exercise with pt today.  Pt plans to walk at home and is considering a community gym for exercise.  Reviewed THR, pulse, RPE, sign and symptoms, NTG use, and when to call 911 or MD.  Also discussed weather considerations and indoor options.  Pt voiced understanding.    Dr. Mark Miller is Medical Director for [facility] and [facility]. 

## 2018-04-13 DIAGNOSIS — D869 Sarcoidosis, unspecified: Secondary | ICD-10-CM

## 2018-04-13 DIAGNOSIS — R635 Abnormal weight gain: Secondary | ICD-10-CM | POA: Diagnosis not present

## 2018-04-13 NOTE — Progress Notes (Signed)
Daily Session Note  Patient Details  Name: Jennifer [name] MRN: [id number] Date of Birth: [birth year] Referring Provider:     Pulmonary Rehab from 03/21/2018 in [facility] Cardiac and Pulmonary Rehab  Referring Provider  Flora Lipps MD      Encounter Date: 04/13/2018  Check In: Session Check In - 04/13/18 1042      Check-In   Supervising physician immediately available to respond to emergencies  [facility] immediately available ER MD    Physician(s)  Lucita Lora and [place]ark    Location  [facility]-Cardiac & Pulmonary Rehab    Staff Present  Justin Mend Lorre Nick, MA, RCEP, CCRP, Exercise Physiologist;Amanda Oletta Darter, [place], ACSM CEP, Exercise Physiologist    Medication changes reported      No    Fall or balance concerns reported     No    Warm-up and Cool-down  Performed as group-led instruction    Resistance Training Performed  Yes    VAD Patient?  No    PAD/SET Patient?  No      Pain Assessment   Currently in Pain?  No/denies    Multiple Pain Sites  No          Social History   Tobacco Use  Smoking Status Never Smoker  Smokeless Tobacco Never Used    Goals Met:  Independence with exercise equipment Exercise tolerated well No report of cardiac concerns or symptoms Strength training completed today  Goals Unmet:  Not Applicable  Comments: Pt able to follow exercise prescription today without complaint.  Will continue to monitor for progression.    Dr. Emily Filbert is Medical Director for [place] and [facility].

## 2018-04-15 ENCOUNTER — Encounter: Payer: Medicaid Other | Admitting: *Deleted

## 2018-04-15 DIAGNOSIS — R635 Abnormal weight gain: Secondary | ICD-10-CM | POA: Diagnosis not present

## 2018-04-15 DIAGNOSIS — D869 Sarcoidosis, unspecified: Secondary | ICD-10-CM

## 2018-04-15 NOTE — Progress Notes (Signed)
Daily Session Note  Patient Details  Name: Jennifer [name] MRN: [id number] Date of Birth: 1971/01/05 Referring Provider:     Pulmonary Rehab from 03/21/2018 in [facility] Cardiac and Pulmonary Rehab  Referring Provider  Flora Lipps MD      Encounter Date: 04/15/2018  Check In: Session Check In - 04/15/18 1015      Check-In   Supervising physician immediately available to respond to emergencies  [facility] immediately available ER MD    Physician(s)  Dr. Alfred Levins and [facility]    Location  [facility]-Cardiac & Pulmonary Rehab    Staff Present  Renita Papa, RN BSN;[facility], [place], ACSM CEP, Exercise Physiologist    Medication changes reported      No    Fall or balance concerns reported     No    Tobacco Cessation  No Change    Warm-up and Cool-down  Performed as group-led instruction    Resistance Training Performed  Yes    VAD Patient?  No      Pain Assessment   Currently in Pain?  No/denies          Social History   Tobacco Use  Smoking Status Never Smoker  Smokeless Tobacco Never Used    Goals Met:  Proper associated with RPD/PD & O2 Sat Independence with exercise equipment Using PLB without cueing & demonstrates good technique Exercise tolerated well No report of cardiac concerns or symptoms Strength training completed today  Goals Unmet:  Not Applicable  Comments: Pt able to follow exercise prescription today without complaint.  Will continue to monitor for progression.    Dr. Emily Filbert is Medical Director for [place] and [facility].

## 2018-04-18 ENCOUNTER — Encounter: Payer: Medicaid Other | Admitting: *Deleted

## 2018-04-18 DIAGNOSIS — R635 Abnormal weight gain: Secondary | ICD-10-CM | POA: Diagnosis not present

## 2018-04-18 DIAGNOSIS — D869 Sarcoidosis, unspecified: Secondary | ICD-10-CM

## 2018-04-18 NOTE — Progress Notes (Signed)
Daily Session Note  Patient Details  Name: Jennifer [name] MRN: [id number] Date of Birth: [birth year] Referring Provider:     Pulmonary Rehab from 03/21/2018 in [facility] Cardiac and Pulmonary Rehab  Referring Provider  Flora Lipps MD      Encounter Date: 04/18/2018  Check In: Session Check In - 04/18/18 1035      Check-In   Supervising physician immediately available to respond to emergencies  [facility] immediately available ER MD    Physician(s)  Dr. Quentin Cornwall and Dr. Corky Downs    Location  [facility]-Cardiac & Pulmonary Rehab    Staff Present  Earlean Shawl, BS, ACSM CEP, Exercise Physiologist;Amanda Oletta Darter, BA, ACSM CEP, Exercise Physiologist;Mandi Ballard, BS, PEC    Medication changes reported      No    Fall or balance concerns reported     No    Tobacco Cessation  No Change    Warm-up and Cool-down  Performed as group-led instruction    Resistance Training Performed  Yes    VAD Patient?  No    PAD/SET Patient?  No      Pain Assessment   Currently in Pain?  No/denies    Multiple Pain Sites  No          Social History   Tobacco Use  Smoking Status Never Smoker  Smokeless Tobacco Never Used    Goals Met:  Proper associated with RPD/PD & O2 Sat Independence with exercise equipment Exercise tolerated well No report of cardiac concerns or symptoms Strength training completed today  Goals Unmet:  Not Applicable  Comments: Pt able to follow exercise prescription today without complaint.  Will continue to monitor for progression.    Dr. Emily Filbert is Medical Director for [place] and [facility].

## 2018-04-20 DIAGNOSIS — R635 Abnormal weight gain: Secondary | ICD-10-CM | POA: Diagnosis not present

## 2018-04-20 DIAGNOSIS — D869 Sarcoidosis, unspecified: Secondary | ICD-10-CM

## 2018-04-20 NOTE — Progress Notes (Signed)
Daily Session Note  Patient Details  Name: Jennifer [name] MRN: [id number] Date of Birth: 05/20/1971 Referring Provider:     Pulmonary Rehab from 03/21/2018 in [facility] Cardiac and Pulmonary Rehab  Referring Provider  Kasa, Kurian MD      Encounter Date: 04/20/2018  Check In: Session Check In - 04/20/18 1155      Check-In   Supervising physician immediately available to respond to emergencies  [facility] immediately available ER MD    Physician(s)  Malinda and Williams    Location  [facility]-Cardiac & Pulmonary Rehab    Staff Present  Mary Jo Abernethy, RN, BSN, MA;Laureen Brown, BS, RRT, Respiratory Therapist;Jessica Hawkins, MA, RCEP, CCRP, Exercise Physiologist;Amanda Sommer, BA, ACSM CEP, Exercise Physiologist          Social History   Tobacco Use  Smoking Status Never Smoker  Smokeless Tobacco Never Used    Goals Met:  Proper associated with RPD/PD & O2 Sat Independence with exercise equipment Exercise tolerated well Strength training completed today  Goals Unmet:  Not Applicable  Comments: Pt able to follow exercise prescription today without complaint.  Will continue to monitor for progression.    Dr. Mark Miller is Medical Director for [facility] and [facility]. 

## 2018-04-22 ENCOUNTER — Encounter: Payer: Medicaid Other | Admitting: *Deleted

## 2018-04-22 DIAGNOSIS — R635 Abnormal weight gain: Secondary | ICD-10-CM | POA: Diagnosis not present

## 2018-04-22 DIAGNOSIS — D869 Sarcoidosis, unspecified: Secondary | ICD-10-CM

## 2018-04-22 NOTE — Progress Notes (Signed)
Daily Session Note  Patient Details  Name: MARLEIGH KAYLOR MRN: [id number] Date of Birth: 03-Apr-1971 Referring Provider:     Pulmonary Rehab from 03/21/2018 in [facility] Cardiac and Pulmonary Rehab  Referring Provider  Flora Lipps MD      Encounter Date: 04/22/2018  Check In: Session Check In - 04/22/18 1045      Check-In   Supervising physician immediately available to respond to emergencies  [facility] immediately available ER MD    Physician(s)  Drs. Saidecki and [facility] Pul[facility] Re[facility]    Staff Present  Gerlene Burdock, RN, BSN;Meredith Sherryll Burger, RN Vickki Hearing, BA, ACSM CEP, Exercise Physiologist    Medication changes reported      No    Fall or balance concerns reported     No    Warm-up and Cool-down  Performed as group-led instruction    Resistance Training Performed  Yes    VAD Patient?  No      Pain Assessment   Currently in Pain?  No/denies          Social History   Tobacco Use  Smoking Status Never Smoker  Smokeless Tobacco Never Used    Goals Met:  Proper associated with RPD/PD & O2 Sat Independence with exercise equipment Using PLB without cueing & demonstrates good technique Exercise tolerated well No report of cardiac concerns or symptoms Strength training completed today  Goals Unmet:  Not Applicable  Comments: Pt able to follow exercise prescription today without complaint.  Will continue to monitor for progression. Pt reports that she has not been feeling well.    Dr. Emily Filbert is Medical Director for [place] and [facility].

## 2018-04-27 ENCOUNTER — Telehealth: Payer: Self-pay | Admitting: Internal Medicine

## 2018-04-27 ENCOUNTER — Telehealth: Payer: Self-pay

## 2018-04-27 NOTE — Telephone Encounter (Signed)
I was unable to leave message for Social Alan RipperWorker-Misty R Ahmad, LPN, not available. Dr. Belia HemanKasa will not return to office until Friday. Procedure is tomorrow so this will have to be rescheduled due to no pulmonary clearance. I have left message for the pt to contact office.

## 2018-04-27 NOTE — Telephone Encounter (Signed)
Left message that I was waiting on her pulmonary clearance from Dr. Belia HemanKasa. I will call pt when I know we have it or if this is not approved. Left message to contact office.

## 2018-04-27 NOTE — Telephone Encounter (Signed)
Clearance needed for Colonoscopy and egd tomorrow .   Original request sent 7/31 please call office asap.

## 2018-04-27 NOTE — Telephone Encounter (Signed)
Called [facility] and left message we just received request for clearance on 04/26/18. We were having some difficulty with our fax machine during week of 04/06/18. Procedure will need to be rescheduled.

## 2018-04-27 NOTE — Telephone Encounter (Signed)
Patient called Endo wanting her arrival time. Please call patient and let her know that her procedure was canceled (per endo)

## 2018-04-27 NOTE — Telephone Encounter (Signed)
Called [facility] and informed Jennifer [name] as stated in previous message.

## 2018-04-28 ENCOUNTER — Encounter: Admission: RE | Payer: Self-pay | Source: Ambulatory Visit

## 2018-04-28 ENCOUNTER — Ambulatory Visit: Admission: RE | Admit: 2018-04-28 | Payer: Medicaid Other | Source: Ambulatory Visit | Admitting: Gastroenterology

## 2018-04-28 SURGERY — COLONOSCOPY WITH PROPOFOL
Anesthesia: Choice

## 2018-04-28 NOTE — Telephone Encounter (Signed)
Pt called this am after contacting hospital for the time for her procedure and they told her it was canceled. Pt called here and I informed pt that I called her and left message to contact office. Her pulmonary clearance was not done so we will reschedule after this is obtained.

## 2018-05-02 ENCOUNTER — Encounter: Payer: Medicaid Other | Admitting: *Deleted

## 2018-05-02 DIAGNOSIS — R635 Abnormal weight gain: Secondary | ICD-10-CM | POA: Diagnosis not present

## 2018-05-02 DIAGNOSIS — D869 Sarcoidosis, unspecified: Secondary | ICD-10-CM

## 2018-05-02 NOTE — Progress Notes (Signed)
Daily Session Note  Patient Details  Name: Jennifer [name] MRN: [id number] Date of Birth: [birth year] Referring Provider:     Pulmonary Rehab from 03/21/2018 in [facility] Cardiac and Pulmonary Rehab  Referring Provider  Flora Lipps MD      Encounter Date: 05/02/2018  Check In: Session Check In - 05/02/18 1028      Check-In   Supervising physician immediately available to respond to emergencies  [facility] immediately available ER MD    Physician(s)  Dr. Clearnce Hasten and Dr. Archie Balboa    Location  [facility]-Cardiac & Pulmonary Rehab    Staff Present  Earlean Shawl, BS, ACSM CEP, Exercise Physiologist;Amanda Oletta Darter, BA, ACSM CEP, Exercise Physiologist;Joseph Tessie Fass RCP,RRT,BSRT    Medication changes reported      No    Fall or balance concerns reported     No    Tobacco Cessation  No Change    Warm-up and Cool-down  Performed as group-led instruction    Resistance Training Performed  Yes    VAD Patient?  No    PAD/SET Patient?  No      Pain Assessment   Currently in Pain?  No/denies    Multiple Pain Sites  No          Social History   Tobacco Use  Smoking Status Never Smoker  Smokeless Tobacco Never Used    Goals Met:  Proper associated with RPD/PD & O2 Sat Independence with exercise equipment Exercise tolerated well No report of cardiac concerns or symptoms Strength training completed today  Goals Unmet:  Not Applicable  Comments: Pt able to follow exercise prescription today without complaint.  Will continue to monitor for progression.    Dr. Emily Filbert is Medical Director for [place] and [facility].

## 2018-05-02 NOTE — Progress Notes (Signed)
Pulmonary Individual Treatment Plan  Patient Details  Name: Jennifer [name] MRN: [id number] Date of Birth: 1970-12-23 Referring Provider:     Pulmonary Rehab from 03/21/2018 in [facility] Re[facility]  Referring Provider  Flora Lipps MD      Initial Encounter Date:    Pulmonary Rehab from 03/21/2018 in [facility] Cardiac and Pulmonary Rehab  Date  03/21/18      Visit Diagnosis: Sarcoidosis  Patient's Home Medications on Admission:  Current Outpatient Medications:  .  acetaminophen (TYLENOL) 500 MG tablet, Take 500 mg by mouth every 6 (six) hours as needed., Disp: , Rfl:  .  ADVAIR DISKUS 250-50 MCG/DOSE AEPB, Inhale 1 puff into the lungs as directed. (Patient taking differently: Inhale 1 puff into the lungs 2 (two) times daily. ), Disp: 60 each, Rfl: 2 .  albuterol (ACCUNEB) 1.25 MG/3ML nebulizer solution, Take 1 ampule by nebulization every 6 (six) hours as needed for wheezing., Disp: , Rfl:  .  Azelastine-Fluticasone 137-50 MCG/ACT SUSP, Place 1 spray into the nose 2 (two) times daily., Disp: 1 Bottle, Rfl: 0 .  cetirizine (ZYRTEC) 10 MG tablet, Take 1 tablet (10 mg total) by mouth daily., Disp: 30 tablet, Rfl: 3 .  diclofenac (VOLTAREN) 75 MG EC tablet, Take 1 tablet (75 mg total) by mouth 2 (two) times daily., Disp: 30 tablet, Rfl: 0 .  fluticasone (FLONASE) 50 MCG/ACT nasal spray, Place 2 sprays into both nostrils daily., Disp: , Rfl: 0 .  glycerin adult 2 g suppository, Place 1 suppository rectally as needed for constipation., Disp: , Rfl:  .  ibuprofen (ADVIL,MOTRIN) 400 MG tablet, Take 400 mg by mouth every 6 (six) hours as needed., Disp: , Rfl:  .  lactulose (CHRONULAC) 10 GM/15ML solution, Take 10 g by mouth 2 (two) times daily., Disp: , Rfl:  .  metoprolol tartrate (LOPRESSOR) 100 MG tablet, Take 1 tablet (100 mg total) by mouth 2 (two) times daily., Disp: 180 tablet, Rfl: 1 .  omeprazole (PRILOSEC) 40 MG capsule, Take 1 capsule (40 mg total) by mouth daily., Disp: 90  capsule, Rfl: 3 .  ondansetron (ZOFRAN) 4 MG tablet, Take 4 mg by mouth every 6 (six) hours as needed., Disp: , Rfl: 0 .  polyethylene glycol powder (GLYCOLAX/MIRALAX) powder, Take 17 g by mouth as needed., Disp: , Rfl: 0 .  predniSONE (DELTASONE) 5 MG tablet, take 2 tablets by mouth once daily with BREAKFAST, Disp: 60 tablet, Rfl: 1 .  PROAIR HFA 108 (90 BASE) MCG/ACT inhaler, Inhale 108 mcg into the lungs every 6 (six) hours as needed., Disp: , Rfl:  .  Probiotic Product (CULTURELLE IMMUNE DEFENSE PO), Take 1 capsule by mouth daily., Disp: , Rfl:  .  pseudoephedrine (SUDAFED) 60 MG tablet, Take 1 tablet (60 mg total) by mouth every 4 (four) hours as needed for congestion., Disp: 24 tablet, Rfl: 0 .  ranitidine (ZANTAC) 75 MG tablet, Take 75 mg by mouth 2 (two) times daily., Disp: , Rfl:  .  SAPHRIS 5 MG SUBL 24 hr tablet, Take 5 mg by mouth daily., Disp: , Rfl: 0 .  Vitamin D, Cholecalciferol, 1000 units TABS, Take 500 Units by mouth daily. , Disp: , Rfl:  .  voriconazole (VFEND) 200 MG tablet, Take 200 mg by mouth 2 (two) times daily., Disp: , Rfl:   Past Medical History: Past Medical History:  Diagnosis Date  . Anxiety   . Asthma   . Depression   . Fibromyalgia   . Incontinence   .  Lymphadenopathy   . Pneumonia    bilateral community acquired   . PVC's (premature ventricular contractions)   . Sarcoidosis   . Sepsis ([place])   . Shortness of breath dyspnea   . Tachycardia     Tobacco Use: Social History   Tobacco Use  Smoking Status Never Smoker  Smokeless Tobacco Never Used    Labs: Recent Review Flowsheet Data    There is no flowsheet data to display.       Pulmonary Assessment Scores: Pulmonary Assessment Scores    Row Name 03/21/18 1530         ADL UCSD   ADL Phase  Entry     SOB Score total  61     Rest  3     Walk  3     Stairs  4     Bath  2     Dress  3     Shop  4       CAT Score   CAT Score  23       mMRC Score   mMRC Score  2         Pulmonary Function Assessment: Pulmonary Function Assessment - 03/21/18 1531      Breath   Bilateral Breath Sounds  Clear;Decreased    Shortness of Breath  Yes;Fear of Shortness of Breath;Panic with Shortness of Breath;Limiting activity       Exercise Target Goals: Exercise Program Goal: Individual exercise prescription set using results from initial 6 min walk test and THRR while considering  patient's activity barriers and safety.   Exercise Prescription Goal: Initial exercise prescription builds to 30-45 minutes a day of aerobic activity, 2-3 days per week.  Home exercise guidelines will be given to patient during program as part of exercise prescription that the participant will acknowledge.  Activity Barriers & Risk Stratification: Activity Barriers & Cardiac Risk Stratification - 03/21/18 1533      Activity Barriers & Cardiac Risk Stratification   Activity Barriers  Deconditioning;Muscular Weakness;Shortness of Breath;Balance Concerns       6 Minute Walk: 6 Minute Walk    Row Name 03/21/18 1531         6 Minute Walk   Phase  Initial     Distance  1070 feet     Walk Time  6 minutes     # of Rest Breaks  0     MPH  2.02     METS  4.15     RPE  11     Perceived Dyspnea   3     VO2 Peak  14.53     Symptoms  Yes (comment)     Comments  SOB     Resting HR  99 bpm     Resting BP  126/64     Resting Oxygen Saturation   98 %     Exercise Oxygen Saturation  during 6 min walk  95 %     Max Ex. HR  132 bpm     Max Ex. BP  144/72     2 Minute Post BP  126/64       Interval HR   1 Minute HR  122     2 Minute HR  126     3 Minute HR  122     4 Minute HR  127     5 Minute HR  132     6 Minute HR  128  2 Minute Post HR  109     Interval Heart Rate?  Yes       Interval Oxygen   Interval Oxygen?  Yes     Baseline Oxygen Saturation %  98 %     1 Minute Oxygen Saturation %  97 %     1 Minute Liters of Oxygen  0 L Room Air     2 Minute Oxygen Saturation %  97  %     2 Minute Liters of Oxygen  0 L     3 Minute Oxygen Saturation %  97 %     3 Minute Liters of Oxygen  0 L     4 Minute Oxygen Saturation %  97 % 3:58 95%     4 Minute Liters of Oxygen  0 L     5 Minute Oxygen Saturation %  98 %     5 Minute Liters of Oxygen  0 L     6 Minute Oxygen Saturation %  97 %     6 Minute Liters of Oxygen  0 L     2 Minute Post Oxygen Saturation %  98 %     2 Minute Post Liters of Oxygen  0 L       Oxygen Initial Assessment: Oxygen Initial Assessment - 03/21/18 1539      Home Oxygen   Home Oxygen Device  None    Sleep Oxygen Prescription  None    Home Exercise Oxygen Prescription  None    Home at Rest Exercise Oxygen Prescription  None      Initial 6 min Walk   Oxygen Used  None      Program Oxygen Prescription   Program Oxygen Prescription  None      Intervention   Short Term Goals  To learn and demonstrate proper use of respiratory medications;To learn and demonstrate proper pursed lip breathing techniques or other breathing techniques.;To learn and understand importance of maintaining oxygen saturations>88%;To learn and understand importance of monitoring SPO2 with pulse oximeter and demonstrate accurate use of the pulse oximeter.    Long  Term Goals  Verbalizes importance of monitoring SPO2 with pulse oximeter and return demonstration;Maintenance of O2 saturations>88%;Exhibits proper breathing techniques, such as pursed lip breathing or other method taught during program session;Compliance with respiratory medication;Demonstrates proper use of MDI's       Oxygen Re-Evaluation: Oxygen Re-Evaluation    Row Name 04/15/18 1027             Program Oxygen Prescription   Program Oxygen Prescription  None         Home Oxygen   Home Oxygen Device  None       Sleep Oxygen Prescription  None       Home Exercise Oxygen Prescription  None       Home at Rest Exercise Oxygen Prescription  None         Goals/Expected Outcomes   Short Term Goals   To learn and demonstrate proper use of respiratory medications;To learn and demonstrate proper pursed lip breathing techniques or other breathing techniques.;To learn and understand importance of maintaining oxygen saturations>88%;To learn and understand importance of monitoring SPO2 with pulse oximeter and demonstrate accurate use of the pulse oximeter.       Long  Term Goals  Verbalizes importance of monitoring SPO2 with pulse oximeter and return demonstration;Maintenance of O2 saturations>88%;Exhibits proper breathing techniques, such as pursed lip breathing or other method taught during   program session;Compliance with respiratory medication;Demonstrates proper use of MDI's       Comments  Patient is doing well in [facility] and wants to get better with her breathing. She is taking Advair and Symbicort to help with her lung issues. She has not needed to use her rescue inhaler or her neblizer in awhile. She does not have a pulse oximeter to check her oxygen at home but does have a blood pressure cuff. Informed patient to obtain a pulse oximeter and where to get one.       Goals/Expected Outcomes  Short: obtain a pulse oximeter for home use. Long: Monitor oxygen and heart rate at home independently.          Oxygen Discharge (Final Oxygen Re-Evaluation): Oxygen Re-Evaluation - 04/15/18 1027      Program Oxygen Prescription   Program Oxygen Prescription  None      Home Oxygen   Home Oxygen Device  None    Sleep Oxygen Prescription  None    Home Exercise Oxygen Prescription  None    Home at Rest Exercise Oxygen Prescription  None      Goals/Expected Outcomes   Short Term Goals  To learn and demonstrate proper use of respiratory medications;To learn and demonstrate proper pursed lip breathing techniques or other breathing techniques.;To learn and understand importance of maintaining oxygen saturations>88%;To learn and understand importance of monitoring SPO2 with pulse oximeter and demonstrate  accurate use of the pulse oximeter.    Long  Term Goals  Verbalizes importance of monitoring SPO2 with pulse oximeter and return demonstration;Maintenance of O2 saturations>88%;Exhibits proper breathing techniques, such as pursed lip breathing or other method taught during program session;Compliance with respiratory medication;Demonstrates proper use of MDI's    Comments  Patient is doing well in [facility] and wants to get better with her breathing. She is taking Advair and Symbicort to help with her lung issues. She has not needed to use her rescue inhaler or her neblizer in awhile. She does not have a pulse oximeter to check her oxygen at home but does have a blood pressure cuff. Informed patient to obtain a pulse oximeter and where to get one.    Goals/Expected Outcomes  Short: obtain a pulse oximeter for home use. Long: Monitor oxygen and heart rate at home independently.       Initial Exercise Prescription: Initial Exercise Prescription - 03/21/18 1600      Date of Initial Exercise RX and Referring Provider   Date  03/21/18    Referring Provider  Kasa, Kurian MD      Treadmill   MPH  2    Grade  0    Minutes  1    METs  2.53      NuStep   Level  2    SPM  80    Minutes  15    METs  2.5      REL-XR   Level  1    Speed  50    Minutes  15    METs  2.5      Prescription Details   Frequency (times per week)  3    Duration  Progress to 45 minutes of aerobic exercise without signs/symptoms of physical distress      Intensity   THRR 40-80% of Max Heartrate  129-158    Ratings of Perceived Exertion  11-13    Perceived Dyspnea  0-4      Progression   Progression  Continue to   progress workloads to maintain intensity without signs/symptoms of physical distress.      Resistance Training   Training Prescription  Yes    Weight  3 lbs    Reps  10-15       Perform Capillary Blood Glucose checks as needed.  Exercise Prescription Changes: Exercise Prescription Changes    Row  Name 03/21/18 1500 04/07/18 1100 04/11/18 1100 04/21/18 1000       Response to Exercise   Blood Pressure (Admit)  126/64  122/80  -  124/70    Blood Pressure (Exercise)  144/72  -  -  -    Blood Pressure (Exit)  126/64  110/60  -  100/60    Heart Rate (Admit)  99 bpm  107 bpm  -  124 bpm    Heart Rate (Exercise)  132 bpm  121 bpm  -  131 bpm    Heart Rate (Exit)  109 bpm  99 bpm  -  110 bpm    Oxygen Saturation (Admit)  98 %  98 %  -  98 %    Oxygen Saturation (Exercise)  95 %  98 %  -  98 %    Oxygen Saturation (Exit)  98 %  98 %  -  97 %    Rating of Perceived Exertion (Exercise)  11  15  -  13    Perceived Dyspnea (Exercise)  3  2  -  1    Symptoms  SOB  -  -  -    Comments  walk test results  -  -  -    Duration  -  Progress to 45 minutes of aerobic exercise without signs/symptoms of physical distress  -  Progress to 45 minutes of aerobic exercise without signs/symptoms of physical distress    Intensity  -  THRR unchanged  -  THRR unchanged      Progression   Progression  -  Continue to progress workloads to maintain intensity without signs/symptoms of physical distress.  Continue to progress workloads to maintain intensity without signs/symptoms of physical distress.  Continue to progress workloads to maintain intensity without signs/symptoms of physical distress.    Average METs  -  3.3  3.3  3.7      Resistance Training   Training Prescription  -  Yes  Yes  Yes    Weight  -  3 lb  3 lb  3 lb    Reps  -  10-15  10-15  10-15      Interval Training   Interval Training  -  No  No  No      Treadmill   MPH  -  _0 Grade  -  0  0  0    Minutes  -  _1 METs  -  2.53  2.53  2.53      NuStep   Level  -  2  2  -    SPM  -  80  80  -    Minutes  -  15  15  -    METs  -  2.2  2.2  -      REL-XR   Level  -  _2 Speed  -  50  50  50    Minutes  -  15  15  15    METs  -  5.5  5.5  -      Home Exercise Plan   Plans to continue exercise at  -  -  Home  (comment) walking at home, considering a community gym  Home (comment) walking at home, considering a community gym    Frequency  -  -  Add 2 additional days to program exercise sessions.  Add 2 additional days to program exercise sessions.    Initial Home Exercises Provided  -  -  04/11/18  04/11/18       Exercise Comments: Exercise Comments    Row Name 03/30/18 1024           Exercise Comments   First full day of exercise!  Patient was oriented to gym and equipment including functions, settings, policies, and procedures.  Patient's individual exercise prescription and treatment plan were reviewed.  All starting workloads were established based on the results of the 6 minute walk test done at initial orientation visit.  The plan for exercise progression was also introduced and progression will be customized based on patient's performance and goals.          Exercise Goals and Review: Exercise Goals    Row Name 03/21/18 1534             Exercise Goals   Increase Physical Activity  Yes       Intervention  Provide advice, education, support and counseling about physical activity/exercise needs.;Develop an individualized exercise prescription for aerobic and resistive training based on initial evaluation findings, risk stratification, comorbidities and participant's personal goals.       Expected Outcomes  Short Term: Attend rehab on a regular basis to increase amount of physical activity.;Long Term: Add in home exercise to make exercise part of routine and to increase amount of physical activity.;Long Term: Exercising regularly at least 3-5 days a week.       Increase Strength and Stamina  Yes       Intervention  Provide advice, education, support and counseling about physical activity/exercise needs.;Develop an individualized exercise prescription for aerobic and resistive training based on initial evaluation findings, risk stratification, comorbidities and participant's personal goals.        Expected Outcomes  Short Term: Increase workloads from initial exercise prescription for resistance, speed, and METs.;Short Term: Perform resistance training exercises routinely during rehab and add in resistance training at home;Long Term: Improve cardiorespiratory fitness, muscular endurance and strength as measured by increased METs and functional capacity (6MWT)       Able to understand and use rate of perceived exertion (RPE) scale  Yes       Intervention  Provide education and explanation on how to use RPE scale       Expected Outcomes  Short Term: Able to use RPE daily in rehab to express subjective intensity level;Long Term:  Able to use RPE to guide intensity level when exercising independently       Able to understand and use Dyspnea scale  Yes       Intervention  Provide education and explanation on how to use Dyspnea scale       Expected Outcomes  Short Term: Able to use Dyspnea scale daily in rehab to express subjective sense of shortness of breath during exertion;Long Term: Able to use Dyspnea scale to guide intensity level when exercising independently       Knowledge and understanding of Target Heart Rate Range (THRR)  Yes  Intervention  Provide education and explanation of THRR including how the numbers were predicted and where they are located for reference       Expected Outcomes  Short Term: Able to state/look up THRR;Short Term: Able to use daily as guideline for intensity in rehab;Long Term: Able to use THRR to govern intensity when exercising independently       Able to check pulse independently  Yes       Intervention  Provide education and demonstration on how to check pulse in carotid and radial arteries.;Review the importance of being able to check your own pulse for safety during independent exercise       Expected Outcomes  Short Term: Able to explain why pulse checking is important during independent exercise;Long Term: Able to check pulse independently and  accurately       Understanding of Exercise Prescription  Yes       Intervention  Provide education, explanation, and written materials on patient's individual exercise prescription       Expected Outcomes  Short Term: Able to explain program exercise prescription;Long Term: Able to explain home exercise prescription to exercise independently          Exercise Goals Re-Evaluation : Exercise Goals Re-Evaluation    Row Name 03/30/18 1024 04/07/18 1107 04/11/18 1036 04/21/18 1059       Exercise Goal Re-Evaluation   Exercise Goals Review  Increase Physical Activity;Increase Strength and Stamina;Able to understand and use rate of perceived exertion (RPE) scale;Able to understand and use Dyspnea scale  Increase Physical Activity;Able to understand and use rate of perceived exertion (RPE) scale;Increase Strength and Stamina;Able to understand and use Dyspnea scale  Increase Physical Activity;Able to understand and use rate of perceived exertion (RPE) scale;Increase Strength and Stamina;Able to understand and use Dyspnea scale;Knowledge and understanding of Target Heart Rate Range (THRR);Able to check pulse independently;Understanding of Exercise Prescription  Increase Physical Activity;Increase Strength and Stamina;Able to understand and use rate of perceived exertion (RPE) scale;Able to understand and use Dyspnea scale    Comments  Reviewed RPE scale, THR and program prescription with pt today.  Pt voiced understanding and was given a copy of goals to take home.   Jennifer [name] is tolerating exercise well. Staff will monitor progress.  Reviewed home exercise with pt today.  Pt plans to walk at home and is considering a community gym for exercise.  Reviewed THR, pulse, RPE, sign and symptoms, NTG use, and when to call 911 or MD.  Also discussed weather considerations and indoor options.  Pt voiced understanding.  Jennifer [name] tolerates exercise well and has moved resistance up on XR and NS.  Staff will monitor progress.     Expected Outcomes  Short: Use RPE daily to regulate intensity. Long: Follow program prescription in THR.  Short - attend 3 days per week Long - improve MET level  Short: add 2-3 days of exercise outside of class. Long: Become independent with exercise program.   Short - increase TM grade Long - increase overall MET level       Discharge Exercise Prescription (Final Exercise Prescription Changes): Exercise Prescription Changes - 04/21/18 1000      Response to Exercise   Blood Pressure (Admit)  124/70    Blood Pressure (Exit)  100/60    Heart Rate (Admit)  124 bpm    Heart Rate (Exercise)  131 bpm    Heart Rate (Exit)  110 bpm    Oxygen Saturation (Admit)  98 %      Oxygen Saturation (Exercise)  98 %    Oxygen Saturation (Exit)  97 %    Rating of Perceived Exertion (Exercise)  13    Perceived Dyspnea (Exercise)  1    Duration  Progress to 45 minutes of aerobic exercise without signs/symptoms of physical distress    Intensity  THRR unchanged      Progression   Progression  Continue to progress workloads to maintain intensity without signs/symptoms of physical distress.    Average METs  3.7      Resistance Training   Training Prescription  Yes    Weight  3 lb    Reps  10-15      Interval Training   Interval Training  No      Treadmill   MPH  2    Grade  0    Minutes  15    METs  2.53      REL-XR   Level  3    Speed  50    Minutes  15      Home Exercise Plan   Plans to continue exercise at  Home (comment)   walking at home, considering a community gym   Frequency  Add 2 additional days to program exercise sessions.    Initial Home Exercises Provided  04/11/18       Nutrition:  Target Goals: Understanding of nutrition guidelines, daily intake of sodium <1549m, cholesterol <2033m calories 30% from fat and 7% or less from saturated fats, daily to have 5 or more servings of fruits and vegetables.  Biometrics: Pre Biometrics - 03/21/18 1534      Pre Biometrics    Height  4' 11.4" (1.509 m)    Weight  125 lb 3.2 oz (56.8 kg)    Waist Circumference  28 inches    Hip Circumference  37 inches    Waist to Hip Ratio  0.76 %    BMI (Calculated)  24.94    Single Leg Stand  3.8 seconds        Nutrition Therapy Plan and Nutrition Goals: Nutrition Therapy & Goals - 03/21/18 1527      Personal Nutrition Goals   Nutrition Goal  Meet with the dietician    Personal Goal #2  Lose some weight    Personal Goal #3  come up with a budget for eating    Comments  Cost is a factor in her diet. She cant afford much and wants to learn how to eat better on a budget.      Intervention Plan   Intervention  Prescribe, educate and counsel regarding individualized specific dietary modifications aiming towards targeted core components such as weight, hypertension, lipid management, diabetes, heart failure and other comorbidities.;Nutrition handout(s) given to patient.    Expected Outcomes  Short Term Goal: Understand basic principles of dietary content, such as calories, fat, sodium, cholesterol and nutrients.;Long Term Goal: Adherence to prescribed nutrition plan.       Nutrition Assessments: Nutrition Assessments - 03/21/18 1523      MEDFICTS Scores   Pre Score  39       Nutrition Goals Re-Evaluation: Nutrition Goals Re-Evaluation    [place] 04/15/18 1043             Goals   Current Weight  128 lb (58.1 kg)       Nutrition Goal  [place]       Comment  She would like to learn how to  eat better and what to eat to help her breathing. She has been trying to change her diet habits but has not had great success.       Expected Outcome  Short: meet with the dietician. Long: adhere to a diet plane.          Nutrition Goals Discharge (Final Nutrition Goals Re-Evaluation): Nutrition Goals Re-Evaluation - 04/15/18 1043      Goals   Current Weight  128 lb (58.1 kg)    Nutrition Goal  Meet with the dietcian. Eat better    Comment  She  would like to learn how to eat better and what to eat to help her breathing. She has been trying to change her diet habits but has not had great success.    Expected Outcome  Short: meet with the dietician. Long: adhere to a diet plane.       Psychosocial: Target Goals: Acknowledge presence or absence of significant depression and/or stress, maximize coping skills, provide positive support system. Participant is able to verbalize types and ability to use techniques and skills needed for reducing stress and depression.   Initial Review & Psychosocial Screening: Initial Psych Review & Screening - 03/21/18 1520      Initial Review   Current issues with  Current Psychotropic Meds;Current Depression;Current Sleep Concerns;Current Stress Concerns;History of Depression    Source of Stress Concerns  Chronic Illness;Financial;Poor Coping Skills;Unable to participate in former interests or hobbies    Comments  Her lung disease and financial are the main concerns of her drepression.      Family Dynamics   Good Support System?  No    Strains  Intra-family strains    Comments  She does not rely on family for support but can talk to her boyfriend for support.      Barriers   Psychosocial barriers to participate in program  The patient should benefit from training in stress management and relaxation.;Psychosocial barriers identified (see note)      Screening Interventions   Interventions  Encouraged to exercise;To provide support and resources with identified psychosocial needs;Program counselor consult;Provide feedback about the scores to participant    Expected Outcomes  Short Term goal: Utilizing psychosocial counselor, staff and physician to assist with identification of specific Stressors or current issues interfering with healing process. Setting desired goal for each stressor or current issue identified.;Long Term Goal: Stressors or current issues are controlled or eliminated.;Short Term goal:  Identification and review with participant of any Quality of Life or Depression concerns found by scoring the questionnaire.;Long Term goal: The participant improves quality of Life and PHQ9 Scores as seen by post scores and/or verbalization of changes       Quality of Life Scores:  Scores of 19 and below usually indicate a poorer quality of life in these areas.  A difference of  2-3 points is a clinically meaningful difference.  A difference of 2-3 points in the total score of the Quality of Life Index has been associated with significant improvement in overall quality of life, self-image, physical symptoms, and general health in studies assessing change in quality of life.  PHQ-9: Recent Review Flowsheet Data    Depression screen PHQ 2/9 03/21/2018 12/24/2017   Decreased Interest 1 2   Down, Depressed, Hopeless 2 2   PHQ - 2 Score 3 4   Altered sleeping 2 2   Tired, decreased energy 2 2   Change in appetite 2 0   Feeling bad or failure   about yourself  2 1   Trouble concentrating 3 2   Moving slowly or fidgety/restless 0 2   Suicidal thoughts 0 0   PHQ-9 Score 14 13   Difficult doing work/chores Somewhat difficult Somewhat difficult      Interpretation of Total Score  Total Score Depression Severity:  1-4 = Minimal depression, 5-9 = Mild depression, 10-14 = Moderate depression, 15-19 = Moderately severe depression, 20-27 = Severe depression   Psychosocial Evaluation and Intervention: Psychosocial Evaluation - 03/30/18 1112      Psychosocial Evaluation & Interventions   Interventions  Encouraged to exercise with the program and follow exercise prescription;Stress management education;Relaxation education    Comments  Counselor met with Jennifer [name]) today for initial psychosocial evaluation.  She is a 47 year old who reports having a "hole in her lungs" subsequent to double pneumonia ~1 year ago.  Jennifer [name] has a limited support system with a boyfriend and a friend and reports she  has a sister locally but most of her family relationships are conflictual.  Jennifer [name] has (2) children that she states are "distant" from her.  She reports not sleeping well (maybe 4 hours per night for a long time) and her appetite "sucks!"  Jennifer [name] has a history of depression and anxiety and is on medication for the past 5 months that she reports is helpful. Jennifer [name] has multiple stressors with her health; finances and conflict in her family relationships.  She has goals to sleep better; complete this program; and breathe better.  Counselor will be following with her.    Expected Outcomes  Short:  Jennifer [name] will attend class and exercise consistently to begin to breathe better and hopefully sleep better.  She will contact her Dr. about a possible sleep study if this does not improve soon.  Long:  Jennifer [name] will develop a routine of exercise for stress and her overall health.    Continue Psychosocial Services   Follow up required by counselor       Psychosocial Re-Evaluation: Psychosocial Re-Evaluation    [place] Name 04/15/18 1034             Psychosocial Re-Evaluation   Current issues with  Current Stress Concerns;Current Depression;History of Depression;Current Psychotropic Meds;Current Sleep Concerns       Comments  Patient has car issues and is stressing her out.  She has to work and getting to her appointments is hard. Her car is getting fixed from the water pump going out. She only works part time and is short on money. She likes to keep busy at home and wants to learn how to breath easier so she can do more at home and in the community. Informed her to deep breath and do relaxation techniques at home.       Expected Outcomes  Short: attend [facility] to decrease stress. Do some relaxation techniques at home. Long: maintain exercise to decrease stress.       Interventions  Encouraged to attend Pulmonary Rehabilitation for the exercise;Relaxation education       Continue Psychosocial Services   Follow up  required by staff          Psychosocial Discharge (Final Psychosocial Re-Evaluation): Psychosocial Re-Evaluation - 04/15/18 1034      Psychosocial Re-Evaluation   Current issues with  Current Stress Concerns;Current Depression;History of Depression;Current Psychotropic Meds;Current Sleep Concerns    Comments  Patient has car issues and is stressing her out.  She has to work and getting to her appointments is  hard. Her car is getting fixed from the water pump going out. She only works part time and is short on money. She likes to keep busy at home and wants to learn how to breath easier so she can do more at home and in the community. Informed her to deep breath and do relaxation techniques at home.    Expected Outcomes  Short: attend [facility] to decrease stress. Do some relaxation techniques at home. Long: maintain exercise to decrease stress.    Interventions  Encouraged to attend Pulmonary Rehabilitation for the exercise;Relaxation education    Continue Psychosocial Services   Follow up required by staff       Education: Education Goals: Education classes will be provided on a weekly basis, covering required topics. Participant will state understanding/return demonstration of topics presented.  Learning Barriers/Preferences: Learning Barriers/Preferences - 03/21/18 1531      Learning Barriers/Preferences   Learning Barriers  Sight    Learning Preferences  None       Education Topics:  Initial Evaluation Education: - Verbal, written and demonstration of respiratory meds, oximetry and breathing techniques. Instruction on use of nebulizers and MDIs and importance of monitoring MDI activations.   Pulmonary Rehab from 04/22/2018 in [facility]  Date  03/21/18  Educator  JH  Instruction Review Code  1- Verbalizes Understanding      General Nutrition Guidelines/Fats and Fiber: -Group instruction provided by verbal, written material, models and posters to  present the general guidelines for heart healthy nutrition. Gives an explanation and review of dietary fats and fiber.   Controlling Sodium/Reading Food Labels: -Group verbal and written material supporting the discussion of sodium use in heart healthy nutrition. Review and explanation with models, verbal and written materials for utilization of the food label.   Exercise Physiology & General Exercise Guidelines: - Group verbal and written instruction with models to review the exercise physiology of the cardiovascular system and associated critical values. Provides general exercise guidelines with specific guidelines to those with heart or lung disease.    Aerobic Exercise & Resistance Training: - Gives group verbal and written instruction on the various components of exercise. Focuses on aerobic and resistive training programs and the benefits of this training and how to safely progress through these programs.   Flexibility, Balance, Mind/Body Relaxation: Provides group verbal/written instruction on the benefits of flexibility and balance training, including mind/body exercise modes such as yoga, pilates and tai chi.  Demonstration and skill practice provided.   Stress and Anxiety: - Provides group verbal and written instruction about the health risks of elevated stress and causes of high stress.  Discuss the correlation between heart/lung disease and anxiety and treatment options. Review healthy ways to manage with stress and anxiety.   Pulmonary Rehab from 04/22/2018 in [facility]  Date  04/13/18  Educator  KC  Instruction Review Code  1- Verbalizes Understanding      Depression: - Provides group verbal and written instruction on the correlation between heart/lung disease and depressed mood, treatment options, and the stigmas associated with seeking treatment.   Exercise & Equipment Safety: - Individual verbal instruction and demonstration of equipment use and  safety with use of the equipment.   Pulmonary Rehab from 04/22/2018 in [facility]  Date  03/21/18  Educator  JH  Instruction Review Code  1- Verbalizes Understanding      Infection Prevention: - Provides verbal and written material to individual with discussion   of infection control including proper hand washing and proper equipment cleaning during exercise session.   Pulmonary Rehab from 04/22/2018 in [facility]  Date  03/21/18  Educator  JH  Instruction Review Code  1- Verbalizes Understanding      Falls Prevention: - Provides verbal and written material to individual with discussion of falls prevention and safety.   Pulmonary Rehab from 04/22/2018 in [facility]  Date  03/21/18  Educator  JH  Instruction Review Code  1- Verbalizes Understanding      Diabetes: - Individual verbal and written instruction to review signs/symptoms of diabetes, desired ranges of glucose level fasting, after meals and with exercise. Advice that pre and post exercise glucose checks will be done for 3 sessions at entry of program.   Chronic Lung Diseases: - Group verbal and written instruction to review updates, respiratory medications, advancements in procedures and treatments. Discuss use of supplemental oxygen including available portable oxygen systems, continuous and intermittent flow rates, concentrators, personal use and safety guidelines. Review proper use of inhaler and spacers. Provide informative websites for self-education.    Energy Conservation: - Provide group verbal and written instruction for methods to conserve energy, plan and organize activities. Instruct on pacing techniques, use of adaptive equipment and posture/positioning to relieve shortness of breath.   Triggers and Exacerbations: - Group verbal and written instruction to review types of environmental triggers and ways to prevent exacerbations. Discuss weather  changes, air quality and the benefits of nasal washing. Review warning signs and symptoms to help prevent infections. Discuss techniques for effective airway clearance, coughing, and vibrations.   AED/CPR: - Group verbal and written instruction with the use of models to demonstrate the basic use of the AED with the basic ABC's of resuscitation.   Pulmonary Rehab from 04/22/2018 in [facility]  Date  04/22/18  Educator  MC  Instruction Review Code  1- Verbalizes Understanding      Anatomy and Physiology of the Lungs: - Group verbal and written instruction with the use of models to provide basic lung anatomy and physiology related to function, structure and complications of lung disease.   Anatomy & Physiology of the Heart: - Group verbal and written instruction and models provide basic cardiac anatomy and physiology, with the coronary electrical and arterial systems. Review of Valvular disease and Heart Failure   Cardiac Medications: - Group verbal and written instruction to review commonly prescribed medications for heart disease. Reviews the medication, class of the drug, and side effects.   Pulmonary Rehab from 04/22/2018 in [facility]  Date  04/20/18  Educator  MC  Instruction Review Code  3- Needs Reinforcement      Know Your Numbers and Risk Factors: -Group verbal and written instruction about important numbers in your health.  Discussion of what are risk factors and how they play a role in the disease process.  Review of Cholesterol, Blood Pressure, Diabetes, and BMI and the role they play in your overall health.   Sleep Hygiene: -Provides group verbal and written instruction about how sleep can affect your health.  Define sleep hygiene, discuss sleep cycles and impact of sleep habits. Review good sleep hygiene tips.    Other: -Provides group and verbal instruction on various topics (see comments)    Knowledge Questionnaire  Score: Knowledge Questionnaire Score - 03/21/18 1532      Knowledge Questionnaire Score   Pre Score  10/18     reviewed with patient       Core Components/Risk Factors/Patient Goals at Admission: Personal Goals and Risk Factors at Admission - 03/21/18 1540      Core Components/Risk Factors/Patient Goals on Admission    Weight Management  Yes;Weight Loss    Intervention  Weight Management: Develop a combined nutrition and exercise program designed to reach desired caloric intake, while maintaining appropriate intake of nutrient and fiber, sodium and fats, and appropriate energy expenditure required for the weight goal.;Weight Management: Provide education and appropriate resources to help participant work on and attain dietary goals.;Weight Management/Obesity: Establish reasonable short term and long term weight goals.    Admit Weight  125 lb 8 oz (56.9 kg)    Goal Weight: Short Term  120 lb (54.4 kg)    Goal Weight: Long Term  120 lb (54.4 kg)    Expected Outcomes  Short Term: Continue to assess and modify interventions until short term weight is achieved;Long Term: Adherence to nutrition and physical activity/exercise program aimed toward attainment of established weight goal;Weight Maintenance: Understanding of the daily nutrition guidelines, which includes 25-35% calories from fat, 7% or less cal from saturated fats, less than 200mg cholesterol, less than 1.5gm of sodium, & 5 or more servings of fruits and vegetables daily;Weight Loss: Understanding of general recommendations for a balanced deficit meal plan, which promotes 1-2 lb weight loss per week and includes a negative energy balance of 500-1000 kcal/d;Understanding recommendations for meals to include 15-35% energy as protein, 25-35% energy from fat, 35-60% energy from carbohydrates, less than 200mg of dietary cholesterol, 20-35 gm of total fiber daily;Understanding of distribution of calorie intake throughout the day with the consumption  of 4-5 meals/snacks    Improve shortness of breath with ADL's  Yes    Intervention  Provide education, individualized exercise plan and daily activity instruction to help decrease symptoms of SOB with activities of daily living.    Expected Outcomes  Short Term: Improve cardiorespiratory fitness to achieve a reduction of symptoms when performing ADLs;Long Term: Be able to perform more ADLs without symptoms or delay the onset of symptoms    Hypertension  Yes    Intervention  Provide education on lifestyle modifcations including regular physical activity/exercise, weight management, moderate sodium restriction and increased consumption of fresh fruit, vegetables, and low fat dairy, alcohol moderation, and smoking cessation.;Monitor prescription use compliance.    Expected Outcomes  Short Term: Continued assessment and intervention until BP is < 140/90mm HG in hypertensive participants. < 130/80mm HG in hypertensive participants with diabetes, heart failure or chronic kidney disease.;Long Term: Maintenance of blood pressure at goal levels.    Lipids  Yes    Intervention  Provide education and support for participant on nutrition & aerobic/resistive exercise along with prescribed medications to achieve LDL <70mg, HDL >40mg.    Expected Outcomes  Short Term: Participant states understanding of desired cholesterol values and is compliant with medications prescribed. Participant is following exercise prescription and nutrition guidelines.;Long Term: Cholesterol controlled with medications as prescribed, with individualized exercise RX and with personalized nutrition plan. Value goals: LDL < 70mg, HDL > 40 mg.       Core Components/Risk Factors/Patient Goals Review:  Goals and Risk Factor Review    Row Name 04/15/18 1045             Core Components/Risk Factors/Patient Goals Review   Personal Goals Review  Improve shortness of breath with ADL's;Weight Management/Obesity;Hypertension;Lipids        Review  Patients breathing   has improved slightly since she has started the program. Her blood pressure has been within normal limits. She has had her lipids checked about 4 months ago. She needs to watch what she eats for her cholesterol.        Expected Outcomes  Short: Attend [facility] regularly to improve shortness of breath with ADL's. Long: maintain independence with ADL's           Core Components/Risk Factors/Patient Goals at Discharge (Final Review):  Goals and Risk Factor Review - 04/15/18 1045      Core Components/Risk Factors/Patient Goals Review   Personal Goals Review  Improve shortness of breath with ADL's;Weight Management/Obesity;Hypertension;Lipids    Review  Patients breathing has improved slightly since she has started the program. Her blood pressure has been within normal limits. She has had her lipids checked about 4 months ago. She needs to watch what she eats for her cholesterol.     Expected Outcomes  Short: Attend [facility] regularly to improve shortness of breath with ADL's. Long: maintain independence with ADL's        ITP Comments: ITP Comments    Row Name 03/21/18 1418 04/04/18 0854 04/22/18 1046 05/02/18 0830     ITP Comments  Medical Evaluation completed. Chart sent for review and changes to Dr. Emily Filbert Director of [place]. Diagnosis can be found in [facility] encounter 02/14/2018  30 day review completed. ITP sent to Dr. Emily Filbert Director of [place]. Continue with ITP unless changes are made by physician  Pt reports that she has not been feeling well.   30 day review completed. ITP sent to Dr. Emily Filbert Director of [place]. Continue with ITP unless changes are made by physician       Comments: 30 day review

## 2018-05-11 ENCOUNTER — Encounter: Payer: Medicaid Other | Attending: Family Medicine

## 2018-05-11 DIAGNOSIS — R635 Abnormal weight gain: Secondary | ICD-10-CM | POA: Insufficient documentation

## 2018-05-13 ENCOUNTER — Encounter: Payer: Medicaid Other | Admitting: *Deleted

## 2018-05-13 DIAGNOSIS — D869 Sarcoidosis, unspecified: Secondary | ICD-10-CM

## 2018-05-13 DIAGNOSIS — R635 Abnormal weight gain: Secondary | ICD-10-CM | POA: Diagnosis present

## 2018-05-13 NOTE — Progress Notes (Signed)
Daily Session Note  Patient Details  Name: Jennifer [name] MRN: [id number] Date of Birth: 1970-09-19 Referring Provider:     Pulmonary Rehab from 03/21/2018 in [facility]  Referring Provider  Flora Lipps MD      Encounter Date: 05/13/2018  Check In: Session Check In - 05/13/18 1031      Check-In   Supervising physician immediately available to respond to emergencies  [facility] immediately available ER MD    Physician(s)  Dr. Corky Downs and [facility]    Location  [facility]    Staff Present  Renita Papa, RN BSN;Jessica Luan Pulling, MA, RCEP, CCRP, Exercise Physiologist;Joseph Tessie Fass RCP,RRT,BSRT    Medication changes reported      No    Fall or balance concerns reported     No    Tobacco Cessation  No Change    Warm-up and Cool-down  Performed as group-led instruction    Resistance Training Performed  Yes    VAD Patient?  No    PAD/SET Patient?  No      Pain Assessment   Currently in Pain?  No/denies          Social History   Tobacco Use  Smoking Status Never Smoker  Smokeless Tobacco Never Used    Goals Met:  Proper associated with RPD/PD & O2 Sat Independence with exercise equipment Using PLB without cueing & demonstrates good technique Exercise tolerated well No report of cardiac concerns or symptoms Strength training completed today  Goals Unmet:  Not Applicable  Comments: Pt able to follow exercise prescription today without complaint.  Will continue to monitor for progression.    Dr. Emily Filbert is Medical Director for [place] and [facility].

## 2018-05-16 ENCOUNTER — Encounter: Payer: Medicaid Other | Admitting: *Deleted

## 2018-05-16 DIAGNOSIS — R635 Abnormal weight gain: Secondary | ICD-10-CM | POA: Diagnosis not present

## 2018-05-16 DIAGNOSIS — D869 Sarcoidosis, unspecified: Secondary | ICD-10-CM

## 2018-05-16 NOTE — Progress Notes (Signed)
Daily Session Note  Patient Details  Name: Jennifer [name] MRN: [id number] Date of Birth: 21-Mar-1971 Referring Provider:     Pulmonary Rehab from 03/21/2018 in [facility]  Referring Provider  Flora Lipps MD      Encounter Date: 05/16/2018  Check In: Session Check In - 05/16/18 1054      Check-In   Supervising physician immediately available to respond to emergencies  [facility] immediately available ER MD    Physician(s)  Dr. Kerman Passey and Dr. Joni Fears    Location  [facility]    Staff Present  Earlean Shawl, BS, ACSM CEP, Exercise Physiologist;Amanda Oletta Darter, BA, ACSM CEP, Exercise Physiologist;Joseph Tessie Fass RCP,RRT,BSRT    Medication changes reported      No    Fall or balance concerns reported     No    Tobacco Cessation  No Change    Warm-up and Cool-down  Performed as group-led instruction    Resistance Training Performed  Yes    VAD Patient?  No    PAD/SET Patient?  No      Pain Assessment   Currently in Pain?  No/denies    Multiple Pain Sites  No          Social History   Tobacco Use  Smoking Status Never Smoker  Smokeless Tobacco Never Used    Goals Met:  Proper associated with RPD/PD & O2 Sat Independence with exercise equipment Exercise tolerated well No report of cardiac concerns or symptoms Strength training completed today  Goals Unmet:  Not Applicable  Comments: Pt able to follow exercise prescription today without complaint.  Will continue to monitor for progression.    Dr. Emily Filbert is Medical Director for [place] and [facility].

## 2018-05-24 ENCOUNTER — Telehealth: Payer: Self-pay

## 2018-05-24 NOTE — Telephone Encounter (Signed)
Could not understand person who answered

## 2018-05-30 DIAGNOSIS — D869 Sarcoidosis, unspecified: Secondary | ICD-10-CM

## 2018-05-30 NOTE — Progress Notes (Signed)
Pulmonary Individual Treatment Plan  Patient Details  Name: KATTALEYA ALIA MRN: [id number] Date of Birth: 1971-03-15 Referring Provider:     Pulmonary Rehab from 03/21/2018 in [facility]  Referring Provider  Flora Lipps MD      Initial Encounter Date:    Pulmonary Rehab from 03/21/2018 in [facility] Cardiac and Pulmonary Rehab  Date  03/21/18      Visit Diagnosis: No diagnosis found.  Patient's Home Medications on Admission:  Current Outpatient Medications:  .  acetaminophen (TYLENOL) 500 MG tablet, Take 500 mg by mouth every 6 (six) hours as needed., Disp: , Rfl:  .  ADVAIR DISKUS 250-50 MCG/DOSE AEPB, Inhale 1 puff into the lungs as directed. (Patient taking differently: Inhale 1 puff into the lungs 2 (two) times daily. ), Disp: 60 each, Rfl: 2 .  albuterol (ACCUNEB) 1.25 MG/3ML nebulizer solution, Take 1 ampule by nebulization every 6 (six) hours as needed for wheezing., Disp: , Rfl:  .  Azelastine-Fluticasone 137-50 MCG/ACT SUSP, Place 1 spray into the nose 2 (two) times daily., Disp: 1 Bottle, Rfl: 0 .  cetirizine (ZYRTEC) 10 MG tablet, Take 1 tablet (10 mg total) by mouth daily., Disp: 30 tablet, Rfl: 3 .  diclofenac (VOLTAREN) 75 MG EC tablet, Take 1 tablet (75 mg total) by mouth 2 (two) times daily., Disp: 30 tablet, Rfl: 0 .  fluticasone (FLONASE) 50 MCG/ACT nasal spray, Place 2 sprays into both nostrils daily., Disp: , Rfl: 0 .  glycerin adult 2 g suppository, Place 1 suppository rectally as needed for constipation., Disp: , Rfl:  .  ibuprofen (ADVIL,MOTRIN) 400 MG tablet, Take 400 mg by mouth every 6 (six) hours as needed., Disp: , Rfl:  .  lactulose (CHRONULAC) 10 GM/15ML solution, Take 10 g by mouth 2 (two) times daily., Disp: , Rfl:  .  metoprolol tartrate (LOPRESSOR) 100 MG tablet, Take 1 tablet (100 mg total) by mouth 2 (two) times daily., Disp: 180 tablet, Rfl: 1 .  omeprazole (PRILOSEC) 40 MG capsule, Take 1 capsule (40 mg total) by mouth daily.,  Disp: 90 capsule, Rfl: 3 .  ondansetron (ZOFRAN) 4 MG tablet, Take 4 mg by mouth every 6 (six) hours as needed., Disp: , Rfl: 0 .  polyethylene glycol powder (GLYCOLAX/MIRALAX) powder, Take 17 g by mouth as needed., Disp: , Rfl: 0 .  predniSONE (DELTASONE) 5 MG tablet, take 2 tablets by mouth once daily with BREAKFAST, Disp: 60 tablet, Rfl: 1 .  PROAIR HFA 108 (90 BASE) MCG/ACT inhaler, Inhale 108 mcg into the lungs every 6 (six) hours as needed., Disp: , Rfl:  .  Probiotic Product (CULTURELLE IMMUNE DEFENSE PO), Take 1 capsule by mouth daily., Disp: , Rfl:  .  pseudoephedrine (SUDAFED) 60 MG tablet, Take 1 tablet (60 mg total) by mouth every 4 (four) hours as needed for congestion., Disp: 24 tablet, Rfl: 0 .  ranitidine (ZANTAC) 75 MG tablet, Take 75 mg by mouth 2 (two) times daily., Disp: , Rfl:  .  SAPHRIS 5 MG SUBL 24 hr tablet, Take 5 mg by mouth daily., Disp: , Rfl: 0 .  Vitamin D, Cholecalciferol, 1000 units TABS, Take 500 Units by mouth daily. , Disp: , Rfl:  .  voriconazole (VFEND) 200 MG tablet, Take 200 mg by mouth 2 (two) times daily., Disp: , Rfl:   Past Medical History: Past Medical History:  Diagnosis Date  . Anxiety   . Asthma   . Depression   . Fibromyalgia   .  Incontinence   . Lymphadenopathy   . Pneumonia    bilateral community acquired   . PVC's (premature ventricular contractions)   . Sarcoidosis   . Sepsis ([place])   . Shortness of breath dyspnea   . Tachycardia     Tobacco Use: Social History   Tobacco Use  Smoking Status Never Smoker  Smokeless Tobacco Never Used    Labs: Recent Review Flowsheet Data    There is no flowsheet data to display.       Pulmonary Assessment Scores: Pulmonary Assessment Scores    Row Name 03/21/18 1530         ADL UCSD   ADL Phase  Entry     SOB Score total  61     Rest  3     Walk  3     Stairs  4     Bath  2     Dress  3     Shop  4       CAT Score   CAT Score  23       mMRC Score   mMRC Score  2          Pulmonary Function Assessment: Pulmonary Function Assessment - 03/21/18 1531      Breath   Bilateral Breath Sounds  Clear;Decreased    Shortness of Breath  Yes;Fear of Shortness of Breath;Panic with Shortness of Breath;Limiting activity       Exercise Target Goals: Exercise Program Goal: Individual exercise prescription set using results from initial 6 min walk test and THRR while considering  patient's activity barriers and safety.   Exercise Prescription Goal: Initial exercise prescription builds to 30-45 minutes a day of aerobic activity, 2-3 days per week.  Home exercise guidelines will be given to patient during program as part of exercise prescription that the participant will acknowledge.  Activity Barriers & Risk Stratification: Activity Barriers & Cardiac Risk Stratification - 03/21/18 1533      Activity Barriers & Cardiac Risk Stratification   Activity Barriers  Deconditioning;Muscular Weakness;Shortness of Breath;Balance Concerns       6 Minute Walk: 6 Minute Walk    Row Name 03/21/18 1531         6 Minute Walk   Phase  Initial     Distance  1070 feet     Walk Time  6 minutes     # of Rest Breaks  0     MPH  2.02     METS  4.15     RPE  11     Perceived Dyspnea   3     VO2 Peak  14.53     Symptoms  Yes (comment)     Comments  SOB     Resting HR  99 bpm     Resting BP  126/64     Resting Oxygen Saturation   98 %     Exercise Oxygen Saturation  during 6 min walk  95 %     Max Ex. HR  132 bpm     Max Ex. BP  144/72     2 Minute Post BP  126/64       Interval HR   1 Minute HR  122     2 Minute HR  126     3 Minute HR  122     4 Minute HR  127     5 Minute HR  132     6 Minute  HR  128     2 Minute Post HR  109     Interval Heart Rate?  Yes       Interval Oxygen   Interval Oxygen?  Yes     Baseline Oxygen Saturation %  98 %     1 Minute Oxygen Saturation %  97 %     1 Minute Liters of Oxygen  0 L Room Air     2 Minute Oxygen Saturation %  97  %     2 Minute Liters of Oxygen  0 L     3 Minute Oxygen Saturation %  97 %     3 Minute Liters of Oxygen  0 L     4 Minute Oxygen Saturation %  97 % 3:58 95%     4 Minute Liters of Oxygen  0 L     5 Minute Oxygen Saturation %  98 %     5 Minute Liters of Oxygen  0 L     6 Minute Oxygen Saturation %  97 %     6 Minute Liters of Oxygen  0 L     2 Minute Post Oxygen Saturation %  98 %     2 Minute Post Liters of Oxygen  0 L       Oxygen Initial Assessment: Oxygen Initial Assessment - 03/21/18 1539      Home Oxygen   Home Oxygen Device  None    Sleep Oxygen Prescription  None    Home Exercise Oxygen Prescription  None    Home at Rest Exercise Oxygen Prescription  None      Initial 6 min Walk   Oxygen Used  None      Program Oxygen Prescription   Program Oxygen Prescription  None      Intervention   Short Term Goals  To learn and demonstrate proper use of respiratory medications;To learn and demonstrate proper pursed lip breathing techniques or other breathing techniques.;To learn and understand importance of maintaining oxygen saturations>88%;To learn and understand importance of monitoring SPO2 with pulse oximeter and demonstrate accurate use of the pulse oximeter.    Long  Term Goals  Verbalizes importance of monitoring SPO2 with pulse oximeter and return demonstration;Maintenance of O2 saturations>88%;Exhibits proper breathing techniques, such as pursed lip breathing or other method taught during program session;Compliance with respiratory medication;Demonstrates proper use of MDI's       Oxygen Re-Evaluation: Oxygen Re-Evaluation    Row Name 04/15/18 1027             Program Oxygen Prescription   Program Oxygen Prescription  None         Home Oxygen   Home Oxygen Device  None       Sleep Oxygen Prescription  None       Home Exercise Oxygen Prescription  None       Home at Rest Exercise Oxygen Prescription  None         Goals/Expected Outcomes   Short Term Goals   To learn and demonstrate proper use of respiratory medications;To learn and demonstrate proper pursed lip breathing techniques or other breathing techniques.;To learn and understand importance of maintaining oxygen saturations>88%;To learn and understand importance of monitoring SPO2 with pulse oximeter and demonstrate accurate use of the pulse oximeter.       Long  Term Goals  Verbalizes importance of monitoring SPO2 with pulse oximeter and return demonstration;Maintenance of O2 saturations>88%;Exhibits proper breathing techniques, such as pursed  lip breathing or other method taught during program session;Compliance with respiratory medication;Demonstrates proper use of MDI's       Comments  Patient is doing well in [place] and wants to get better with her breathing. She is taking Advair and Symbicort to help with her lung issues. She has not needed to use her rescue inhaler or her neblizer in [place]. She does not have a pulse oximeter to check her oxygen at home but does have a blood pressure cuff. Informed patient to obtain a pulse oximeter and where to get one.       Goals/Expected Outcomes  Short: obtain a pulse oximeter for home use. Long: Monitor oxygen and heart rate at home independently.          Oxygen Discharge (Final Oxygen Re-Evaluation): Oxygen Re-Evaluation - 04/15/18 1027      Program Oxygen Prescription   Program Oxygen Prescription  None      Home Oxygen   Home Oxygen Device  None    Sleep Oxygen Prescription  None    Home Exercise Oxygen Prescription  None    Home at Rest Exercise Oxygen Prescription  None      Goals/Expected Outcomes   Short Term Goals  To learn and demonstrate proper use of respiratory medications;To learn and demonstrate proper pursed lip breathing techniques or other breathing techniques.;To learn and understand importance of maintaining oxygen saturations>88%;To learn and understand importance of monitoring SPO2 with pulse oximeter and demonstrate  accurate use of the pulse oximeter.    Long  Term Goals  Verbalizes importance of monitoring SPO2 with pulse oximeter and return demonstration;Maintenance of O2 saturations>88%;Exhibits proper breathing techniques, such as pursed lip breathing or other method taught during program session;Compliance with respiratory medication;Demonstrates proper use of MDI's    Comments  Patient is doing well in [place] and wants to get better with her breathing. She is taking Advair and Symbicort to help with her lung issues. She has not needed to use her rescue inhaler or her neblizer in [place]. She does not have a pulse oximeter to check her oxygen at home but does have a blood pressure cuff. Informed patient to obtain a pulse oximeter and where to get one.    Goals/Expected Outcomes  Short: obtain a pulse oximeter for home use. Long: Monitor oxygen and heart rate at home independently.       Initial Exercise Prescription: Initial Exercise Prescription - 03/21/18 1600      Date of Initial Exercise RX and Referring Provider   Date  03/21/18    Referring Provider  Flora Lipps MD      Treadmill   MPH  2    Grade  0    Minutes  1    METs  2.53      NuStep   Level  2    SPM  80    Minutes  15    METs  2.5      REL-XR   Level  1    Speed  50    Minutes  15    METs  2.5      Prescription Details   Frequency (times per week)  3    Duration  Progress to 45 minutes of aerobic exercise without signs/symptoms of physical distress      Intensity   THRR 40-80% of Max Heartrate  129-158    Ratings of Perceived Exertion  11-13    Perceived Dyspnea  0-4  Progression   Progression  Continue to progress workloads to maintain intensity without signs/symptoms of physical distress.      Resistance Training   Training Prescription  Yes    Weight  3 lbs    Reps  10-15       Perform Capillary Blood Glucose checks as needed.  Exercise Prescription Changes:  Exercise Prescription Changes     Row Name 03/21/18 1500 04/07/18 1100 04/11/18 1100 04/21/18 1000 05/04/18 1500     Response to Exercise   Blood Pressure (Admit)  126/64  122/80  -  124/70  100/58   Blood Pressure (Exercise)  144/72  -  -  -  -   Blood Pressure (Exit)  126/64  110/60  -  100/60  102/64   Heart Rate (Admit)  99 bpm  107 bpm  -  124 bpm  110 bpm   Heart Rate (Exercise)  132 bpm  121 bpm  -  131 bpm  128 bpm   Heart Rate (Exit)  109 bpm  99 bpm  -  110 bpm  108 bpm   Oxygen Saturation (Admit)  98 %  98 %  -  98 %  98 %   Oxygen Saturation (Exercise)  95 %  98 %  -  98 %  95 %   Oxygen Saturation (Exit)  98 %  98 %  -  97 %  95 %   Rating of Perceived Exertion (Exercise)  11  15  -  13  14   Perceived Dyspnea (Exercise)  3  2  -  1  1   Symptoms  SOB  -  -  -  -   Comments  walk test results  -  -  -  -   Duration  -  Progress to 45 minutes of aerobic exercise without signs/symptoms of physical distress  -  Progress to 45 minutes of aerobic exercise without signs/symptoms of physical distress  Continue with 45 min of aerobic exercise without signs/symptoms of physical distress.   Intensity  -  THRR unchanged  -  THRR unchanged  THRR unchanged     Progression   Progression  -  Continue to progress workloads to maintain intensity without signs/symptoms of physical distress.  Continue to progress workloads to maintain intensity without signs/symptoms of physical distress.  Continue to progress workloads to maintain intensity without signs/symptoms of physical distress.  Continue to progress workloads to maintain intensity without signs/symptoms of physical distress.   Average METs  -  3.3  3.3  3.7  3.4     Resistance Training   Training Prescription  -  Yes  Yes  Yes  Yes   Weight  -  3 lb  3 lb  3 lb  3 lb   Reps  -  10-15  10-15  10-15  10-15     Interval Training   Interval Training  -  No  No  No  No     Treadmill   MPH  -  2  2  2  2    Grade  -  0  0  0  0.5   Minutes  -  15  15  15  15    METs  -   2.53  2.53  2.53  2.67     NuStep   Level  -  2  2  -  2   SPM  -  80  80  -  80   Minutes  -  15  15  -  15   METs  -  2.2  2.2  -  2.7     REL-XR   Level  -  1  1  3  3    Speed  -  50  50  50  50   Minutes  -  15  15  15  15    METs  -  5.5  5.5  -  4.1     Home Exercise Plan   Plans to continue exercise at  -  -  Home (comment) walking at home, considering a community gym  Home (comment) walking at home, considering a community gym  Home (comment) walking at home, considering a community gym   Frequency  -  -  Add 2 additional days to program exercise sessions.  Add 2 additional days to program exercise sessions.  Add 2 additional days to program exercise sessions.   Initial Home Exercises Provided  -  -  04/11/18  04/11/18  04/11/18   Row Name 05/19/18 1000             Response to Exercise   Blood Pressure (Admit)  102/58       Blood Pressure (Exit)  100/74       Heart Rate (Admit)  102 bpm       Heart Rate (Exercise)  123 bpm       Heart Rate (Exit)  99 bpm       Oxygen Saturation (Admit)  97 %       Oxygen Saturation (Exercise)  95 %       Oxygen Saturation (Exit)  97 %       Rating of Perceived Exertion (Exercise)  14       Perceived Dyspnea (Exercise)  0       Duration  Continue with 45 min of aerobic exercise without signs/symptoms of physical distress.       Intensity  THRR unchanged         Progression   Progression  Continue to progress workloads to maintain intensity without signs/symptoms of physical distress.       Average METs  3.3         Resistance Training   Training Prescription  Yes       Weight  3 lb       Reps  10-15         Interval Training   Interval Training  No         Treadmill   MPH  2       Grade  0.5       Minutes  15       METs  2.67         NuStep   Level  2       SPM  80       Minutes  15       METs  2.5         REL-XR   Level  3       Speed  50       Minutes  15       METs  4.8         Home Exercise Plan   Plans  to continue exercise at  Home (comment) walking at home, considering a community gym       Frequency  Add 2 additional days to program exercise  sessions.       Initial Home Exercises Provided  04/11/18          Exercise Comments:  Exercise Comments    Row Name 03/30/18 1024           Exercise Comments   First full day of exercise!  Patient was oriented to gym and equipment including functions, settings, policies, and procedures.  Patient's individual exercise prescription and treatment plan were reviewed.  All starting workloads were established based on the results of the 6 minute walk test done at initial orientation visit.  The plan for exercise progression was also introduced and progression will be customized based on patient's performance and goals.          Exercise Goals and Review:  Exercise Goals    Row Name 03/21/18 1534             Exercise Goals   Increase Physical Activity  Yes       Intervention  Provide advice, education, support and counseling about physical activity/exercise needs.;Develop an individualized exercise prescription for aerobic and resistive training based on initial evaluation findings, risk stratification, comorbidities and participant's personal goals.       Expected Outcomes  Short Term: Attend rehab on a regular basis to increase amount of physical activity.;Long Term: Add in home exercise to make exercise part of routine and to increase amount of physical activity.;Long Term: Exercising regularly at least 3-5 days a week.       Increase Strength and Stamina  Yes       Intervention  Provide advice, education, support and counseling about physical activity/exercise needs.;Develop an individualized exercise prescription for aerobic and resistive training based on initial evaluation findings, risk stratification, comorbidities and participant's personal goals.       Expected Outcomes  Short Term: Increase workloads from initial exercise prescription  for resistance, speed, and METs.;Short Term: Perform resistance training exercises routinely during rehab and add in resistance training at home;Long Term: Improve cardiorespiratory fitness, muscular endurance and strength as measured by increased METs and functional capacity (6MWT)       Able to understand and use rate of perceived exertion (RPE) scale  Yes       Intervention  Provide education and explanation on how to use RPE scale       Expected Outcomes  Short Term: Able to use RPE daily in rehab to express subjective intensity level;Long Term:  Able to use RPE to guide intensity level when exercising independently       Able to understand and use Dyspnea scale  Yes       Intervention  Provide education and explanation on how to use Dyspnea scale       Expected Outcomes  Short Term: Able to use Dyspnea scale daily in rehab to express subjective sense of shortness of breath during exertion;Long Term: Able to use Dyspnea scale to guide intensity level when exercising independently       Knowledge and understanding of Target Heart Rate Range (THRR)  Yes       Intervention  Provide education and explanation of THRR including how the numbers were predicted and where they are located for reference       Expected Outcomes  Short Term: Able to state/look up THRR;Short Term: Able to use daily as guideline for intensity in rehab;Long Term: Able to use THRR to govern intensity when exercising independently       Able to check pulse independently  Yes  Intervention  Provide education and demonstration on how to check pulse in carotid and radial arteries.;Review the importance of being able to check your own pulse for safety during independent exercise       Expected Outcomes  Short Term: Able to explain why pulse checking is important during independent exercise;Long Term: Able to check pulse independently and accurately       Understanding of Exercise Prescription  Yes       Intervention  Provide  education, explanation, and written materials on patient's individual exercise prescription       Expected Outcomes  Short Term: Able to explain program exercise prescription;Long Term: Able to explain home exercise prescription to exercise independently          Exercise Goals Re-Evaluation : Exercise Goals Re-Evaluation    Row Name 03/30/18 1024 04/07/18 1107 04/11/18 1036 04/21/18 1059 05/04/18 1515     Exercise Goal Re-Evaluation   Exercise Goals Review  Increase Physical Activity;Increase Strength and Stamina;Able to understand and use rate of perceived exertion (RPE) scale;Able to understand and use Dyspnea scale  Increase Physical Activity;Able to understand and use rate of perceived exertion (RPE) scale;Increase Strength and Stamina;Able to understand and use Dyspnea scale  Increase Physical Activity;Able to understand and use rate of perceived exertion (RPE) scale;Increase Strength and Stamina;Able to understand and use Dyspnea scale;Knowledge and understanding of Target Heart Rate Range (THRR);Able to check pulse independently;Understanding of Exercise Prescription  Increase Physical Activity;Increase Strength and Stamina;Able to understand and use rate of perceived exertion (RPE) scale;Able to understand and use Dyspnea scale  Increase Physical Activity;Increase Strength and Stamina;Able to understand and use rate of perceived exertion (RPE) scale;Able to understand and use Dyspnea scale   Comments  Reviewed RPE scale, THR and program prescription with pt today.  Pt voiced understanding and was given a copy of goals to take home.   Rheanne is tolerating exercise well. Staff will monitor progress.  Reviewed home exercise with pt today.  Pt plans to walk at home and is considering a community gym for exercise.  Reviewed THR, pulse, RPE, sign and symptoms, NTG use, and when to call 911 or MD.  Also discussed weather considerations and indoor options.  Pt voiced understanding.  Tarsha tolerates  exercise well and has moved resistance up on XR and NS.  Staff will monitor progress.  Cintia tolerates exercise well and has maintained good O2 sats. Staff will monitor progress.   Expected Outcomes  Short: Use RPE daily to regulate intensity. Long: Follow program prescription in THR.  Short - attend 3 days per week Long - improve MET level  Short: add 2-3 days of exercise outside of class. Long: Become independent with exercise program.   Short - increase TM grade Long - increase overall MET level  Short - continue to attend consistently Long - increase MET level   Row Name 05/19/18 1024             Exercise Goal Re-Evaluation   Exercise Goals Review  Increase Physical Activity;Increase Strength and Stamina;Able to understand and use rate of perceived exertion (RPE) scale;Able to understand and use Dyspnea scale       Comments  Rahel works in target HR and RPE range.  She has improved stamina overall.  She would see further improvement with consistent attendance.       Expected Outcomes  Short - attend LW consistently Long - improve MET level          Discharge Exercise  Prescription (Final Exercise Prescription Changes): Exercise Prescription Changes - 05/19/18 1000      Response to Exercise   Blood Pressure (Admit)  102/58    Blood Pressure (Exit)  100/74    Heart Rate (Admit)  102 bpm    Heart Rate (Exercise)  123 bpm    Heart Rate (Exit)  99 bpm    Oxygen Saturation (Admit)  97 %    Oxygen Saturation (Exercise)  95 %    Oxygen Saturation (Exit)  97 %    Rating of Perceived Exertion (Exercise)  14    Perceived Dyspnea (Exercise)  0    Duration  Continue with 45 min of aerobic exercise without signs/symptoms of physical distress.    Intensity  THRR unchanged      Progression   Progression  Continue to progress workloads to maintain intensity without signs/symptoms of physical distress.    Average METs  3.3      Resistance Training   Training Prescription  Yes    Weight  3 lb      Reps  10-15      Interval Training   Interval Training  No      Treadmill   MPH  2    Grade  0.5    Minutes  15    METs  2.67      NuStep   Level  2    SPM  80    Minutes  15    METs  2.5      REL-XR   Level  3    Speed  50    Minutes  15    METs  4.8      Home Exercise Plan   Plans to continue exercise at  Home (comment)   walking at home, considering a community gym   Frequency  Add 2 additional days to program exercise sessions.    Initial Home Exercises Provided  04/11/18       Nutrition:  Target Goals: Understanding of nutrition guidelines, daily intake of sodium <1537m, cholesterol <2023m calories 30% from fat and 7% or less from saturated fats, daily to have 5 or more servings of fruits and vegetables.  Biometrics: Pre Biometrics - 03/21/18 1534      Pre Biometrics   Height  4' 11.4" (1.509 m)    Weight  125 lb 3.2 oz (56.8 kg)    Waist Circumference  28 inches    Hip Circumference  37 inches    Waist to Hip Ratio  0.76 %    BMI (Calculated)  24.94    Single Leg Stand  3.8 seconds        Nutrition Therapy Plan and Nutrition Goals: Nutrition Therapy & Goals - 03/21/18 1527      Personal Nutrition Goals   Nutrition Goal  Meet with the dietician    Personal Goal #2  Lose some weight    Personal Goal #3  come up with a budget for eating    Comments  Cost is a factor in her diet. She cant afford much and wants to learn how to eat better on a budget.      Intervention Plan   Intervention  Prescribe, educate and counsel regarding individualized specific dietary modifications aiming towards targeted core components such as weight, hypertension, lipid management, diabetes, heart failure and other comorbidities.;Nutrition handout(s) given to patient.    Expected Outcomes  Short Term Goal: Understand basic principles of dietary content, such as calories,  fat, sodium, cholesterol and nutrients.;Long Term Goal: Adherence to prescribed nutrition plan.        Nutrition Assessments: Nutrition Assessments - 03/21/18 1523      MEDFICTS Scores   Pre Score  39       Nutrition Goals Re-Evaluation: Nutrition Goals Re-Evaluation    [place] Name 04/15/18 1043             Goals   Current Weight  128 lb (58.1 kg)       Nutrition Goal  Meet with the [place]. Eat better       Comment  She would like to learn how to eat better and what to eat to help her breathing. She has been trying to change her diet habits but has not had great success.       Expected Outcome  Short: meet with the dietician. Long: adhere to a diet plane.          Nutrition Goals Discharge (Final Nutrition Goals Re-Evaluation): Nutrition Goals Re-Evaluation - 04/15/18 1043      Goals   Current Weight  128 lb (58.1 kg)    Nutrition Goal  Meet with the [place]. Eat better    Comment  She would like to learn how to eat better and what to eat to help her breathing. She has been trying to change her diet habits but has not had great success.    Expected Outcome  Short: meet with the dietician. Long: adhere to a diet plane.       Psychosocial: Target Goals: Acknowledge presence or absence of significant depression and/or stress, maximize coping skills, provide positive support system. Participant is able to verbalize types and ability to use techniques and skills needed for reducing stress and depression.   Initial Review & Psychosocial Screening: Initial Psych Review & Screening - 03/21/18 1520      Initial Review   Current issues with  Current Psychotropic Meds;Current Depression;Current Sleep Concerns;Current Stress Concerns;History of Depression    Source of Stress Concerns  Chronic Illness;Financial;Poor Coping Skills;Unable to participate in former interests or hobbies    Comments  Her lung disease and financial are the main concerns of her drepression.      Family Dynamics   Good Support System?  No    Strains  Intra-family strains    Comments  She does not rely  on family for support but can talk to her boyfriend for support.      Barriers   Psychosocial barriers to participate in program  The patient should benefit from training in stress management and relaxation.;Psychosocial barriers identified (see note)      Screening Interventions   Interventions  Encouraged to exercise;To provide support and resources with identified psychosocial needs;Program counselor consult;Provide feedback about the scores to participant    Expected Outcomes  Short Term goal: Utilizing psychosocial counselor, staff and physician to assist with identification of specific Stressors or current issues interfering with healing process. Setting desired goal for each stressor or current issue identified.;Long Term Goal: Stressors or current issues are controlled or eliminated.;Short Term goal: Identification and review with participant of any Quality of Life or Depression concerns found by scoring the questionnaire.;Long Term goal: The participant improves quality of Life and PHQ9 Scores as seen by post scores and/or verbalization of changes       Quality of Life Scores:  Scores of 19 and below usually indicate a poorer quality of life in these areas.  A difference of  2-3  points is a clinically meaningful difference.  A difference of 2-3 points in the total score of the Quality of Life Index has been associated with significant improvement in overall quality of life, self-image, physical symptoms, and general health in studies assessing change in quality of life.  PHQ-9: Recent Review Flowsheet Data    Depression screen [facility] 2/9 03/21/2018 12/24/2017   Decreased Interest 1 2   Down, Depressed, Hopeless 2 2   PHQ - 2 Score 3 4   Altered sleeping 2 2   Tired, decreased energy 2 2   Change in appetite 2 0   Feeling bad or failure about yourself  2 1   Trouble concentrating 3 2   Moving slowly or fidgety/restless 0 2   Suicidal thoughts 0 0   PHQ-9 Score 14 13   Difficult doing  work/chores Somewhat difficult Somewhat difficult      Interpretation of Total Score  Total Score Depression Severity:  1-4 = Minimal depression, 5-9 = Mild depression, 10-14 = Moderate depression, 15-19 = Moderately severe depression, 20-27 = Severe depression   Psychosocial Evaluation and Intervention: Psychosocial Evaluation - 03/30/18 1112      Psychosocial Evaluation & Interventions   Interventions  Encouraged to exercise with the program and follow exercise prescription;Stress management education;Relaxation education    Comments  Counselor met with Ms. Danelle Earthly  Art) today for initial psychosocial evaluation.  She is a 47 year old who reports having a "hole in her lungs" subsequent to double pneumonia ~1 year ago.  Leonor has a limited support system with a boyfriend and a friend and reports she has a sister locally but most of her family relationships are conflictual.  Shontell has (2) children that she states are "distant" from her.  She reports not sleeping well (maybe 4 hours per night for a long time) and her appetite "sucks!"  Amey has a history of depression and anxiety and is on medication for the past 5 months that she reports is helpful. Aston has multiple stressors with her health; finances and conflict in her family relationships.  She has goals to sleep better; complete this program; and breathe better.  Counselor will be following with her.    Expected Outcomes  Short:  Arieona will attend class and exercise consistently to begin to breathe better and hopefully sleep better.  She will contact her Dr. about a possible sleep study if this does not improve soon.  Long:  Adiva will develop a routine of exercise for stress and her overall health.    Continue Psychosocial Services   Follow up required by counselor       Psychosocial Re-Evaluation: Psychosocial Re-Evaluation    [place] Name 04/15/18 1034             Psychosocial Re-Evaluation   Current issues with  Current Stress  Concerns;Current Depression;History of Depression;Current Psychotropic Meds;Current Sleep Concerns       Comments  Patient has car issues and is stressing her out.  She has to work and getting to her appointments is hard. Her car is getting fixed from the water pump going out. She only works part time and is short on money. She likes to keep busy at home and wants to learn how to breath easier so she can do more at home and in the community. Informed her to deep breath and do relaxation techniques at home.       Expected Outcomes  Short: attend [facility] to decrease stress. Do some relaxation  techniques at home. Long: maintain exercise to decrease stress.       Interventions  Encouraged to attend [facility] for the exercise;Relaxation education       Continue Psychosocial Services   Follow up required by staff          Psychosocial Discharge (Final Psychosocial Re-Evaluation): Psychosocial Re-Evaluation - 04/15/18 1034      Psychosocial Re-Evaluation   Current issues with  Current Stress Concerns;Current Depression;History of Depression;Current Psychotropic Meds;Current Sleep Concerns    Comments  Patient has car issues and is stressing her out.  She has to work and getting to her appointments is hard. Her car is getting fixed from the water pump going out. She only works part time and is short on money. She likes to keep busy at home and wants to learn how to breath easier so she can do more at home and in the community. Informed her to deep breath and do relaxation techniques at home.    Expected Outcomes  Short: attend [facility] to decrease stress. Do some relaxation techniques at home. Long: maintain exercise to decrease stress.    Interventions  Encouraged to attend Pulmonary Rehabilitation for the exercise;Relaxation education    Continue Psychosocial Services   Follow up required by staff       Education: Education Goals: Education classes will be provided on a weekly basis,  covering required topics. Participant will state understanding/return demonstration of topics presented.  Learning Barriers/Preferences: Learning Barriers/Preferences - 03/21/18 1531      Learning Barriers/Preferences   Learning Barriers  Sight    Learning Preferences  None       Education Topics:  Initial Evaluation Education: - Verbal, written and demonstration of respiratory meds, oximetry and breathing techniques. Instruction on use of nebulizers and MDIs and importance of monitoring MDI activations.   Pulmonary Rehab from 04/22/2018 in [facility] and [facility]monary Rehab  Date  03/21/18  Educator  [facility]  Instruction Review Code  1- Verbalizes Understanding      General Nutrition Guidelines/Fats and Fiber: -Group instruction provided by verbal, written material, models and posters to present the general guidelines for heart healthy nutrition. Gives an explanation and review of dietary fats and fiber.   Controlling Sodium/Reading Food Labels: -Group verbal and written material supporting the discussion of sodium use in heart healthy nutrition. Review and explanation with models, verbal and written materials for utilization of the food label.   Exercise Physiology & General Exercise Guidelines: - Group verbal and written instruction with models to review the exercise physiology of the cardiovascular system and associated critical values. Provides general exercise guidelines with specific guidelines to those with heart or lung disease.    Aerobic Exercise & Resistance Training: - Gives group verbal and written instruction on the various components of exercise. Focuses on aerobic and resistive training programs and the benefits of this training and how to safely progress through these programs.   Flexibility, Balance, Mind/Body Relaxation: Provides group verbal/written instruction on the benefits of flexibility and balance training, including mind/body exercise modes such as yoga,  pilates and tai chi.  Demonstration and skill practice provided.   Stress and Anxiety: - Provides group verbal and written instruction about the health risks of elevated stress and causes of high stress.  Discuss the correlation between heart/lung disease and anxiety and treatment options. Review healthy ways to manage with stress and anxiety.   Pulmonary Rehab from 04/22/2018 in [facility] Cardiac and Pulmonary Rehab  Date  04/13/18  Educator  [facility]  Instruction Review Code  1- Verbalizes Understanding      Depression: - Provides group verbal and written instruction on the correlation between heart/lung disease and depressed mood, treatment options, and the stigmas associated with seeking treatment.   Exercise & Equipment Safety: - Individual verbal instruction and demonstration of equipment use and safety with use of the equipment.   Pulmonary Rehab from 04/22/2018 in [facility] Cardiac and Pulmonary Rehab  Date  03/21/18  Educator  [facility]  Instruction Review Code  1- Verbalizes Understanding      Infection Prevention: - Provides verbal and written material to individual with discussion of infection control including proper hand washing and proper equipment cleaning during exercise session.   Pulmonary Rehab from 04/22/2018 in [facility] Cardiac and Pulmonary Rehab  Date  03/21/18  Educator  [facility]  Instruction Review Code  1- Verbalizes Understanding      Falls Prevention: - Provides verbal and written material to individual with discussion of falls prevention and safety.   Pulmonary Rehab from 04/22/2018 in [facility] Cardiac and Pulmonary Rehab  Date  03/21/18  Educator  [facility]  Instruction Review Code  1- Verbalizes Understanding      Diabetes: - Individual verbal and written instruction to review signs/symptoms of diabetes, desired ranges of glucose level fasting, after meals and with exercise. Advice that pre and post exercise glucose checks will be done for 3 sessions at entry of program.   Chronic Lung  Diseases: - Group verbal and written instruction to review updates, respiratory medications, advancements in procedures and treatments. Discuss use of supplemental oxygen including available portable oxygen systems, continuous and intermittent flow rates, concentrators, personal use and safety guidelines. Review proper use of inhaler and spacers. Provide informative websites for self-education.    Energy Conservation: - Provide group verbal and written instruction for methods to conserve energy, plan and organize activities. Instruct on pacing techniques, use of adaptive equipment and posture/positioning to relieve shortness of breath.   Triggers and Exacerbations: - Group verbal and written instruction to review types of environmental triggers and ways to prevent exacerbations. Discuss weather changes, air quality and the benefits of nasal washing. Review warning signs and symptoms to help prevent infections. Discuss techniques for effective airway clearance, coughing, and vibrations.   AED/CPR: - Group verbal and written instruction with the use of models to demonstrate the basic use of the AED with the basic ABC's of resuscitation.   Pulmonary Rehab from 04/22/2018 in [facility] Cardiac and Pulmonary Rehab  Date  04/22/18  Educator  [facility]  Instruction Review Code  1- Actuary and Physiology of the Lungs: - Group verbal and written instruction with the use of models to provide basic lung anatomy and physiology related to function, structure and complications of lung disease.   Anatomy & Physiology of the Heart: - Group verbal and written instruction and models provide basic cardiac anatomy and physiology, with the coronary electrical and arterial systems. Review of Valvular disease and Heart Failure   Cardiac Medications: - Group verbal and written instruction to review commonly prescribed medications for heart disease. Reviews the medication, class of the drug, and  side effects.   Pulmonary Rehab from 04/22/2018 in [facility] Cardiac and Pulmonary Rehab  Date  04/20/18  Educator  [facility]  Instruction Review Code  3- Needs Reinforcement      Know Your Numbers and Risk Factors: -Group verbal and written instruction about important numbers in your health.  Discussion  of what are risk factors and how they play a role in the disease process.  Review of Cholesterol, Blood Pressure, Diabetes, and BMI and the role they play in your overall health.   Sleep Hygiene: -Provides group verbal and written instruction about how sleep can affect your health.  Define sleep hygiene, discuss sleep cycles and impact of sleep habits. Review good sleep hygiene tips.    Other: -Provides group and verbal instruction on various topics (see comments)    Knowledge Questionnaire Score: Knowledge Questionnaire Score - 03/21/18 1532      Knowledge Questionnaire Score   Pre Score  10/18   reviewed with patient       Core Components/Risk Factors/Patient Goals at Admission: Personal Goals and Risk Factors at Admission - 03/21/18 1540      Core Components/Risk Factors/Patient Goals on Admission    Weight Management  Yes;Weight Loss    Intervention  Weight Management: Develop a combined nutrition and exercise program designed to reach desired caloric intake, while maintaining appropriate intake of nutrient and fiber, sodium and fats, and appropriate energy expenditure required for the weight goal.;Weight Management: Provide education and appropriate resources to help participant work on and attain dietary goals.;Weight Management/Obesity: Establish reasonable short term and long term weight goals.    Admit Weight  125 lb 8 oz (56.9 kg)    Goal Weight: Short Term  120 lb (54.4 kg)    Goal Weight: Long Term  120 lb (54.4 kg)    Expected Outcomes  Short Term: Continue to assess and modify interventions until short term weight is achieved;Long Term: Adherence to nutrition and physical  activity/exercise program aimed toward attainment of established weight goal;Weight Maintenance: Understanding of the daily nutrition guidelines, which includes 25-35% calories from fat, 7% or less cal from saturated fats, less than 257m cholesterol, less than 1.5gm of sodium, & 5 or more servings of fruits and vegetables daily;Weight Loss: Understanding of general recommendations for a balanced deficit meal plan, which promotes 1-2 lb weight loss per week and includes a negative energy balance of [id number] kcal/d;Understanding recommendations for meals to include 15-35% energy as protein, 25-35% energy from fat, 35-60% energy from carbohydrates, less than 2023mof dietary cholesterol, 20-35 gm of total fiber daily;Understanding of distribution of calorie intake throughout the day with the consumption of 4-5 meals/snacks    Improve shortness of breath with ADL's  Yes    Intervention  Provide education, individualized exercise plan and daily activity instruction to help decrease symptoms of SOB with activities of daily living.    Expected Outcomes  Short Term: Improve cardiorespiratory fitness to achieve a reduction of symptoms when performing ADLs;Long Term: Be able to perform more ADLs without symptoms or delay the onset of symptoms    Hypertension  Yes    Intervention  Provide education on lifestyle modifcations including regular physical activity/exercise, weight management, moderate sodium restriction and increased consumption of fresh fruit, vegetables, and low fat dairy, alcohol moderation, and smoking cessation.;Monitor prescription use compliance.    Expected Outcomes  Short Term: Continued assessment and intervention until BP is < 140/9030mG in hypertensive participants. < 130/79m90m in hypertensive participants with diabetes, heart failure or chronic kidney disease.;Long Term: Maintenance of blood pressure at goal levels.    Lipids  Yes    Intervention  Provide education and support for  participant on nutrition & aerobic/resistive exercise along with prescribed medications to achieve LDL <70mg58mL >40mg.37mExpected Outcomes  Short Term: Participant  states understanding of desired cholesterol values and is compliant with medications prescribed. Participant is following exercise prescription and nutrition guidelines.;Long Term: Cholesterol controlled with medications as prescribed, with individualized exercise RX and with personalized nutrition plan. Value goals: LDL < 21m, HDL > 40 mg.       Core Components/Risk Factors/Patient Goals Review:  Goals and Risk Factor Review    Row Name 04/15/18 1045             Core Components/Risk Factors/Patient Goals Review   Personal Goals Review  Improve shortness of breath with ADL's;Weight Management/Obesity;Hypertension;Lipids       Review  Patients breathing has improved slightly since she has started the program. Her blood pressure has been within normal limits. She has had her lipids checked about 4 months ago. She needs to watch what she eats for her cholesterol.        Expected Outcomes  Short: Attend [facility] regularly to improve shortness of breath with ADL's. Long: maintain independence with ADL's           Core Components/Risk Factors/Patient Goals at Discharge (Final Review):  Goals and Risk Factor Review - 04/15/18 1045      Core Components/Risk Factors/Patient Goals Review   Personal Goals Review  Improve shortness of breath with ADL's;Weight Management/Obesity;Hypertension;Lipids    Review  Patients breathing has improved slightly since she has started the program. Her blood pressure has been within normal limits. She has had her lipids checked about 4 months ago. She needs to watch what she eats for her cholesterol.     Expected Outcomes  Short: Attend [facility] regularly to improve shortness of breath with ADL's. Long: maintain independence with ADL's        ITP Comments: ITP Comments    Row Name 03/21/18 1418  04/04/18 0854 04/22/18 1046 05/02/18 0830 05/30/18 0834   ITP Comments  Medical Evaluation completed. Chart sent for review and changes to Dr. MEmily FilbertDirector of [place] Diagnosis can be found in [facility] encounter 02/14/2018  30 day review completed. ITP sent to Dr. MEmily FilbertDirector of [place] Continue with ITP unless changes are made by physician  Pt reports that she has not been feeling well.   30 day review completed. ITP sent to Dr. MEmily FilbertDirector of [place] Continue with ITP unless changes are made by physician  patient has trouble with transportation and has not been able to attend class regularly. 30 day review completed. ITP sent to Dr. MEmily FilbertDirector of [place] Continue with ITP unless changes are made by physician      Comments: 30 day review

## 2018-06-01 ENCOUNTER — Telehealth: Payer: Self-pay

## 2018-06-01 NOTE — Telephone Encounter (Signed)
LMOM

## 2018-06-06 ENCOUNTER — Telehealth: Payer: Self-pay | Admitting: *Deleted

## 2018-06-06 ENCOUNTER — Encounter: Payer: Self-pay | Admitting: *Deleted

## 2018-06-06 DIAGNOSIS — D869 Sarcoidosis, unspecified: Secondary | ICD-10-CM

## 2018-06-06 NOTE — Telephone Encounter (Signed)
Called to check on status of return.  Left message on voicemail again.

## 2018-06-06 NOTE — Progress Notes (Signed)
Discharge Progress Report  Patient Details  Name: Jennifer [name] MRN: [id number] Date of Birth: 01-07-1971 Referring Provider:     Pulmonary Rehab from 03/21/2018 in [facility]  Referring Provider  Erin Fulling MD       Number of Visits: 11/36  Reason for Discharge:  Early Exit:  Lack of attendance  Smoking History:  Social History   Tobacco Use  Smoking Status Never Smoker  Smokeless Tobacco Never Used    Diagnosis:  No diagnosis found.  ADL [facility]: Pulmonary Assessment Scores    Row Name 03/21/18 1530         ADL [facility]   ADL Phase  Entry     SOB Score total  61     Rest  3     Walk  3     Stairs  4     Bath  2     Dress  3     Shop  4       CAT Score   CAT Score  23       mMRC Score   mMRC Score  2        Initial Exercise Prescription: Initial Exercise Prescription - 03/21/18 1600      Date of Initial Exercise RX and Referring Provider   Date  03/21/18    Referring Provider  Erin Fulling MD      Treadmill   MPH  2    Grade  0    Minutes  1    METs  2.53      NuStep   Level  2    SPM  80    Minutes  15    METs  2.5      REL-XR   Level  1    Speed  50    Minutes  15    METs  2.5      Prescription Details   Frequency (times per week)  3    Duration  Progress to 45 minutes of aerobic exercise without signs/symptoms of physical distress      Intensity   THRR 40-80% of Max Heartrate  129-158    Ratings of Perceived Exertion  11-13    Perceived Dyspnea  0-4      Progression   Progression  Continue to progress workloads to maintain intensity without signs/symptoms of physical distress.      Resistance Training   Training Prescription  Yes    Weight  3 lbs    Reps  10-15       Discharge Exercise Prescription (Final Exercise Prescription Changes): Exercise Prescription Changes - 05/19/18 1000      Response to Exercise   Blood Pressure (Admit)  102/58    Blood Pressure (Exit)  100/74    Heart Rate (Admit)   102 bpm    Heart Rate (Exercise)  123 bpm    Heart Rate (Exit)  99 bpm    Oxygen Saturation (Admit)  97 %    Oxygen Saturation (Exercise)  95 %    Oxygen Saturation (Exit)  97 %    Rating of Perceived Exertion (Exercise)  14    Perceived Dyspnea (Exercise)  0    Duration  Continue with 45 min of aerobic exercise without signs/symptoms of physical distress.    Intensity  THRR unchanged      Progression   Progression  Continue to progress workloads to maintain intensity without signs/symptoms of physical distress.  Average METs  3.3      Resistance Training   Training Prescription  Yes    Weight  3 lb    Reps  10-15      Interval Training   Interval Training  No      Treadmill   MPH  2    Grade  0.5    Minutes  15    METs  2.67      NuStep   Level  2    SPM  80    Minutes  15    METs  2.5      REL-XR   Level  3    Speed  50    Minutes  15    METs  4.8      Home Exercise Plan   Plans to continue exercise at  Home (comment)   walking at home, considering a community gym   Frequency  Add 2 additional days to program exercise sessions.    Initial Home Exercises Provided  04/11/18       Functional Capacity: 6 Minute Walk    Row Name 03/21/18 1531         6 Minute Walk   Phase  Initial     Distance  1070 feet     Walk Time  6 minutes     # of Rest Breaks  0     MPH  2.02     METS  4.15     RPE  11     Perceived Dyspnea   3     VO2 Peak  14.53     Symptoms  Yes (comment)     Comments  SOB     Resting HR  99 bpm     Resting BP  126/64     Resting Oxygen Saturation   98 %     Exercise Oxygen Saturation  during 6 min walk  95 %     Max Ex. HR  132 bpm     Max Ex. BP  144/72     2 Minute Post BP  126/64       Interval HR   1 Minute HR  122     2 Minute HR  126     3 Minute HR  122     4 Minute HR  127     5 Minute HR  132     6 Minute HR  128     2 Minute Post HR  109     Interval Heart Rate?  Yes       Interval Oxygen   Interval Oxygen?  Yes      Baseline Oxygen Saturation %  98 %     1 Minute Oxygen Saturation %  97 %     1 Minute Liters of Oxygen  0 L Room Air     2 Minute Oxygen Saturation %  97 %     2 Minute Liters of Oxygen  0 L     3 Minute Oxygen Saturation %  97 %     3 Minute Liters of Oxygen  0 L     4 Minute Oxygen Saturation %  97 % 3:58 95%     4 Minute Liters of Oxygen  0 L     5 Minute Oxygen Saturation %  98 %     5 Minute Liters of Oxygen  0 L     6 Minute Oxygen  Saturation %  97 %     6 Minute Liters of Oxygen  0 L     2 Minute Post Oxygen Saturation %  98 %     2 Minute Post Liters of Oxygen  0 L        Psychological, QOL, Others - Outcomes: PHQ 2/9: Depression screen [facility] 2/9 03/21/2018 12/24/2017  Decreased Interest 1 2  Down, Depressed, Hopeless 2 2  PHQ - 2 Score 3 4  Altered sleeping 2 2  Tired, decreased energy 2 2  Change in appetite 2 0  Feeling bad or failure about yourself  2 1  Trouble concentrating 3 2  Moving slowly or fidgety/restless 0 2  Suicidal thoughts 0 0  PHQ-9 Score 14 13  Difficult doing work/chores Somewhat difficult Somewhat difficult    Quality of Life:   Personal Goals: Goals established at orientation with interventions provided to work toward goal. Personal Goals and Risk Factors at Admission - 03/21/18 1540      Core Components/Risk Factors/Patient Goals on Admission    Weight Management  Yes;Weight Loss    Intervention  Weight Management: Develop a combined nutrition and exercise program designed to reach desired caloric intake, while maintaining appropriate intake of nutrient and fiber, sodium and fats, and appropriate energy expenditure required for the weight goal.;Weight Management: Provide education and appropriate resources to help participant work on and attain dietary goals.;Weight Management/Obesity: Establish reasonable short term and long term weight goals.    Admit Weight  125 lb 8 oz (56.9 kg)    Goal Weight: Short Term  120 lb (54.4 kg)    Goal  Weight: Long Term  120 lb (54.4 kg)    Expected Outcomes  Short Term: Continue to assess and modify interventions until short term weight is achieved;Long Term: Adherence to nutrition and physical activity/exercise program aimed toward attainment of established weight goal;Weight Maintenance: Understanding of the daily nutrition guidelines, which includes 25-35% calories from fat, 7% or less cal from saturated fats, less than 200mg  cholesterol, less than 1.5gm of sodium, & 5 or more servings of fruits and vegetables daily;Weight Loss: Understanding of general recommendations for a balanced deficit meal plan, which promotes 1-2 lb weight loss per week and includes a negative energy balance of [phone] kcal/d;Understanding recommendations for meals to include 15-35% energy as protein, 25-35% energy from fat, 35-60% energy from carbohydrates, less than 200mg  of dietary cholesterol, 20-35 gm of total fiber daily;Understanding of distribution of calorie intake throughout the day with the consumption of 4-5 meals/snacks    Improve shortness of breath with ADL's  Yes    Intervention  Provide education, individualized exercise plan and daily activity instruction to help decrease symptoms of SOB with activities of daily living.    Expected Outcomes  Short Term: Improve cardiorespiratory fitness to achieve a reduction of symptoms when performing ADLs;Long Term: Be able to perform more ADLs without symptoms or delay the onset of symptoms    Hypertension  Yes    Intervention  Provide education on lifestyle modifcations including regular physical activity/exercise, weight management, moderate sodium restriction and increased consumption of fresh fruit, vegetables, and low fat dairy, alcohol moderation, and smoking cessation.;Monitor prescription use compliance.    Expected Outcomes  Short Term: Continued assessment and intervention until BP is < 140/44mm HG in hypertensive participants. < 130/69mm HG in hypertensive  participants with diabetes, heart failure or chronic kidney disease.;Long Term: Maintenance of blood pressure at goal levels.    Lipids  Yes  Intervention  Provide education and support for participant on nutrition & aerobic/resistive exercise along with prescribed medications to achieve LDL 70mg , HDL >40mg .    Expected Outcomes  Short Term: Participant states understanding of desired cholesterol values and is compliant with medications prescribed. Participant is following exercise prescription and nutrition guidelines.;Long Term: Cholesterol controlled with medications as prescribed, with individualized exercise RX and with personalized nutrition plan. Value goals: LDL < 70mg , HDL > 40 mg.        Personal Goals Discharge: Goals and Risk Factor Review    Row Name 04/15/18 1045             Core Components/Risk Factors/Patient Goals Review   Personal Goals Review  Improve shortness of breath with ADL's;Weight Management/Obesity;Hypertension;Lipids       Review  Patients breathing has improved slightly since she has started the program. Her blood pressure has been within normal limits. She has had her lipids checked about 4 months ago. She needs to watch what she eats for her cholesterol.        Expected Outcomes  Short: Attend [facility] regularly to improve shortness of breath with ADL's. Long: maintain independence with ADL's           Exercise Goals and Review: Exercise Goals    Row Name 03/21/18 1534             Exercise Goals   Increase Physical Activity  Yes       Intervention  Provide advice, education, support and counseling about physical activity/exercise needs.;Develop an individualized exercise prescription for aerobic and resistive training based on initial evaluation findings, risk stratification, comorbidities and participant's personal goals.       Expected Outcomes  Short Term: Attend rehab on a regular basis to increase amount of physical activity.;Long Term: Add in  home exercise to make exercise part of routine and to increase amount of physical activity.;Long Term: Exercising regularly at least 3-5 days a week.       Increase Strength and Stamina  Yes       Intervention  Provide advice, education, support and counseling about physical activity/exercise needs.;Develop an individualized exercise prescription for aerobic and resistive training based on initial evaluation findings, risk stratification, comorbidities and participant's personal goals.       Expected Outcomes  Short Term: Increase workloads from initial exercise prescription for resistance, speed, and METs.;Short Term: Perform resistance training exercises routinely during rehab and add in resistance training at home;Long Term: Improve cardiorespiratory fitness, muscular endurance and strength as measured by increased METs and functional capacity ( )       Able to understand and use rate of perceived exertion (RPE) scale  Yes       Intervention  Provide education and explanation on how to use RPE scale       Expected Outcomes  Short Term: Able to use RPE daily in rehab to express subjective intensity level;Long Term:  Able to use RPE to guide intensity level when exercising independently       Able to understand and use Dyspnea scale  Yes       Intervention  Provide education and explanation on how to use Dyspnea scale       Expected Outcomes  Short Term: Able to use Dyspnea scale daily in rehab to express subjective sense of shortness of breath during exertion;Long Term: Able to use Dyspnea scale to guide intensity level when exercising independently       Knowledge and understanding of Target  Heart Rate Range (THRR)  Yes       Intervention  Provide education and explanation of THRR including how the numbers were predicted and where they are located for reference       Expected Outcomes  Short Term: Able to state/look up THRR;Short Term: Able to use daily as guideline for intensity in rehab;Long  Term: Able to use THRR to govern intensity when exercising independently       Able to check pulse independently  Yes       Intervention  Provide education and demonstration on how to check pulse in carotid and radial arteries.;Review the importance of being able to check your own pulse for safety during independent exercise       Expected Outcomes  Short Term: Able to explain why pulse checking is important during independent exercise;Long Term: Able to check pulse independently and accurately       Understanding of Exercise Prescription  Yes       Intervention  Provide education, explanation, and written materials on patient's individual exercise prescription       Expected Outcomes  Short Term: Able to explain program exercise prescription;Long Term: Able to explain home exercise prescription to exercise independently          Nutrition & Weight - Outcomes: Pre Biometrics - 03/21/18 1534      Pre Biometrics   Height  4' 11.4" (1.509 m)    Weight  125 lb 3.2 oz (56.8 kg)    Waist Circumference  28 inches    Hip Circumference  37 inches    Waist to Hip Ratio  0.76 %    BMI (Calculated)  24.94    Single Leg Stand  3.8 seconds        Nutrition: Nutrition Therapy & Goals - 03/21/18 1527      Personal Nutrition Goals   Nutrition Goal  Meet with the dietician    Personal Goal #2  Lose some weight    Personal Goal #3  come up with a budget for eating    Comments  Cost is a factor in her diet. She cant afford much and wants to learn how to eat better on a budget.      Intervention Plan   Intervention  Prescribe, educate and counsel regarding individualized specific dietary modifications aiming towards targeted core components such as weight, hypertension, lipid management, diabetes, heart failure and other comorbidities.;Nutrition handout(s) given to patient.    Expected Outcomes  Short Term Goal: Understand basic principles of dietary content, such as calories, fat, sodium, cholesterol  and nutrients.;Long Term Goal: Adherence to prescribed nutrition plan.       Nutrition Discharge: Nutrition Assessments - 03/21/18 1523      MEDFICTS Scores   Pre Score  39       Education Questionnaire Score: Knowledge Questionnaire Score - 03/21/18 1532      Knowledge Questionnaire Score   Pre Score  10/18   reviewed with patient      Goals reviewed with patient; copy given to patient.

## 2018-06-06 NOTE — Progress Notes (Signed)
Pulmonary Individual Treatment Plan  Patient Details  Name: Jennifer [name] MRN: [id number] Date of Birth: June 04, 1971 Referring Provider:     Pulmonary Rehab from 03/21/2018 in [facility]  Referring Provider  Flora Lipps MD      Initial Encounter Date:    Pulmonary Rehab from 03/21/2018 in [facility]  Date  03/21/18      Visit Diagnosis: No diagnosis found.  Patient's Home Medications on Admission:  Current Outpatient Medications:  .  acetaminophen (TYLENOL) 500 MG tablet, Take 500 mg by mouth every 6 (six) hours as needed., Disp: , Rfl:  .  ADVAIR DISKUS 250-50 MCG/DOSE AEPB, Inhale 1 puff into the lungs as directed. (Patient taking differently: Inhale 1 puff into the lungs 2 (two) times daily. ), Disp: 60 each, Rfl: 2 .  albuterol (ACCUNEB) 1.25 MG/3ML nebulizer solution, Take 1 ampule by nebulization every 6 (six) hours as needed for wheezing., Disp: , Rfl:  .  Azelastine-Fluticasone 137-50 MCG/ACT SUSP, Place 1 spray into the nose 2 (two) times daily., Disp: 1 Bottle, Rfl: 0 .  cetirizine (ZYRTEC) 10 MG tablet, Take 1 tablet (10 mg total) by mouth daily., Disp: 30 tablet, Rfl: 3 .  diclofenac (VOLTAREN) 75 MG EC tablet, Take 1 tablet (75 mg total) by mouth 2 (two) times daily., Disp: 30 tablet, Rfl: 0 .  fluticasone (FLONASE) 50 MCG/ACT nasal spray, Place 2 sprays into both nostrils daily., Disp: , Rfl: 0 .  glycerin adult 2 g suppository, Place 1 suppository rectally as needed for constipation., Disp: , Rfl:  .  ibuprofen (ADVIL,MOTRIN) 400 MG tablet, Take 400 mg by mouth every 6 (six) hours as needed., Disp: , Rfl:  .  lactulose (CHRONULAC) 10 GM/15ML solution, Take 10 g by mouth 2 (two) times daily., Disp: , Rfl:  .  metoprolol tartrate (LOPRESSOR) 100 MG tablet, Take 1 tablet (100 mg total) by mouth 2 (two) times daily., Disp: 180 tablet, Rfl: 1 .  omeprazole (PRILOSEC) 40 MG capsule, Take 1 capsule (40 mg total) by mouth daily.,  Disp: 90 capsule, Rfl: 3 .  ondansetron (ZOFRAN) 4 MG tablet, Take 4 mg by mouth every 6 (six) hours as needed., Disp: , Rfl: 0 .  polyethylene glycol powder (GLYCOLAX/MIRALAX) powder, Take 17 g by mouth as needed., Disp: , Rfl: 0 .  predniSONE (DELTASONE) 5 MG tablet, take 2 tablets by mouth once daily with BREAKFAST, Disp: 60 tablet, Rfl: 1 .  PROAIR HFA 108 (90 BASE) MCG/ACT inhaler, Inhale 108 mcg into the lungs every 6 (six) hours as needed., Disp: , Rfl:  .  Probiotic Product (CULTURELLE IMMUNE DEFENSE PO), Take 1 capsule by mouth daily., Disp: , Rfl:  .  pseudoephedrine (SUDAFED) 60 MG tablet, Take 1 tablet (60 mg total) by mouth every 4 (four) hours as needed for congestion., Disp: 24 tablet, Rfl: 0 .  ranitidine (ZANTAC) 75 MG tablet, Take 75 mg by mouth 2 (two) times daily., Disp: , Rfl:  .  SAPHRIS 5 MG SUBL 24 hr tablet, Take 5 mg by mouth daily., Disp: , Rfl: 0 .  Vitamin D, Cholecalciferol, 1000 units TABS, Take 500 Units by mouth daily. , Disp: , Rfl:  .  voriconazole (VFEND) 200 MG tablet, Take 200 mg by mouth 2 (two) times daily., Disp: , Rfl:   Past Medical History: Past Medical History:  Diagnosis Date  . Anxiety   . Asthma   . Depression   . Fibromyalgia   .  Incontinence   . Lymphadenopathy   . Pneumonia    bilateral community acquired   . PVC's (premature ventricular contractions)   . Sarcoidosis   . Sepsis ([place])   . Shortness of breath dyspnea   . Tachycardia     Tobacco Use: Social History   Tobacco Use  Smoking Status Never Smoker  Smokeless Tobacco Never Used    Labs: Recent Review Flowsheet Data    There is no flowsheet data to display.       Pulmonary Assessment Scores: Pulmonary Assessment Scores    Row Name 03/21/18 1530         ADL UCSD   ADL Phase  Entry     SOB Score total  61     Rest  3     Walk  3     Stairs  4     Bath  2     Dress  3     Shop  4       CAT Score   CAT Score  23       mMRC Score   mMRC Score  2          Pulmonary Function Assessment: Pulmonary Function Assessment - 03/21/18 1531      Breath   Bilateral Breath Sounds  Clear;Decreased    Shortness of Breath  Yes;Fear of Shortness of Breath;Panic with Shortness of Breath;Limiting activity       Exercise Target Goals: Exercise Program Goal: Individual exercise prescription set using results from initial 6 min walk test and THRR while considering  patient's activity barriers and safety.   Exercise Prescription Goal: Initial exercise prescription builds to 30-45 minutes a day of aerobic activity, 2-3 days per week.  Home exercise guidelines will be given to patient during program as part of exercise prescription that the participant will acknowledge.  Activity Barriers & Risk Stratification: Activity Barriers & Cardiac Risk Stratification - 03/21/18 1533      Activity Barriers & Cardiac Risk Stratification   Activity Barriers  Deconditioning;Muscular Weakness;Shortness of Breath;Balance Concerns       6 Minute Walk: 6 Minute Walk    Row Name 03/21/18 1531         6 Minute Walk   Phase  Initial     Distance  1070 feet     Walk Time  6 minutes     # of Rest Breaks  0     MPH  2.02     METS  4.15     RPE  11     Perceived Dyspnea   3     VO2 Peak  14.53     Symptoms  Yes (comment)     Comments  SOB     Resting HR  99 bpm     Resting BP  126/64     Resting Oxygen Saturation   98 %     Exercise Oxygen Saturation  during 6 min walk  95 %     Max Ex. HR  132 bpm     Max Ex. BP  144/72     2 Minute Post BP  126/64       Interval HR   1 Minute HR  122     2 Minute HR  126     3 Minute HR  122     4 Minute HR  127     5 Minute HR  132     6 Minute  HR  128     2 Minute Post HR  109     Interval Heart Rate?  Yes       Interval Oxygen   Interval Oxygen?  Yes     Baseline Oxygen Saturation %  98 %     1 Minute Oxygen Saturation %  97 %     1 Minute Liters of Oxygen  0 L Room Air     2 Minute Oxygen Saturation %  97  %     2 Minute Liters of Oxygen  0 L     3 Minute Oxygen Saturation %  97 %     3 Minute Liters of Oxygen  0 L     4 Minute Oxygen Saturation %  97 % 3:58 95%     4 Minute Liters of Oxygen  0 L     5 Minute Oxygen Saturation %  98 %     5 Minute Liters of Oxygen  0 L     6 Minute Oxygen Saturation %  97 %     6 Minute Liters of Oxygen  0 L     2 Minute Post Oxygen Saturation %  98 %     2 Minute Post Liters of Oxygen  0 L       Oxygen Initial Assessment: Oxygen Initial Assessment - 03/21/18 1539      Home Oxygen   Home Oxygen Device  None    Sleep Oxygen Prescription  None    Home Exercise Oxygen Prescription  None    Home at Rest Exercise Oxygen Prescription  None      Initial 6 min Walk   Oxygen Used  None      Program Oxygen Prescription   Program Oxygen Prescription  None      Intervention   Short Term Goals  To learn and demonstrate proper use of respiratory medications;To learn and demonstrate proper pursed lip breathing techniques or other breathing techniques.;To learn and understand importance of maintaining oxygen saturations>88%;To learn and understand importance of monitoring SPO2 with pulse oximeter and demonstrate accurate use of the pulse oximeter.    Long  Term Goals  Verbalizes importance of monitoring SPO2 with pulse oximeter and return demonstration;Maintenance of O2 saturations>88%;Exhibits proper breathing techniques, such as pursed lip breathing or other method taught during program session;Compliance with respiratory medication;Demonstrates proper use of MDI's       Oxygen Re-Evaluation: Oxygen Re-Evaluation    Row Name 04/15/18 1027             Program Oxygen Prescription   Program Oxygen Prescription  None         Home Oxygen   Home Oxygen Device  None       Sleep Oxygen Prescription  None       Home Exercise Oxygen Prescription  None       Home at Rest Exercise Oxygen Prescription  None         Goals/Expected Outcomes   Short Term Goals   To learn and demonstrate proper use of respiratory medications;To learn and demonstrate proper pursed lip breathing techniques or other breathing techniques.;To learn and understand importance of maintaining oxygen saturations>88%;To learn and understand importance of monitoring SPO2 with pulse oximeter and demonstrate accurate use of the pulse oximeter.       Long  Term Goals  Verbalizes importance of monitoring SPO2 with pulse oximeter and return demonstration;Maintenance of O2 saturations>88%;Exhibits proper breathing techniques, such as pursed  lip breathing or other method taught during program session;Compliance with respiratory medication;Demonstrates proper use of MDI's       Comments  Patient is doing well in [place] and wants to get better with her breathing. She is taking Advair and Symbicort to help with her lung issues. She has not needed to use her rescue inhaler or her neblizer in [place]. She does not have a pulse oximeter to check her oxygen at home but does have a blood pressure cuff. Informed patient to obtain a pulse oximeter and where to get one.       Goals/Expected Outcomes  Short: obtain a pulse oximeter for home use. Long: Monitor oxygen and heart rate at home independently.          Oxygen Discharge (Final Oxygen Re-Evaluation): Oxygen Re-Evaluation - 04/15/18 1027      Program Oxygen Prescription   Program Oxygen Prescription  None      Home Oxygen   Home Oxygen Device  None    Sleep Oxygen Prescription  None    Home Exercise Oxygen Prescription  None    Home at Rest Exercise Oxygen Prescription  None      Goals/Expected Outcomes   Short Term Goals  To learn and demonstrate proper use of respiratory medications;To learn and demonstrate proper pursed lip breathing techniques or other breathing techniques.;To learn and understand importance of maintaining oxygen saturations>88%;To learn and understand importance of monitoring SPO2 with pulse oximeter and demonstrate  accurate use of the pulse oximeter.    Long  Term Goals  Verbalizes importance of monitoring SPO2 with pulse oximeter and return demonstration;Maintenance of O2 saturations>88%;Exhibits proper breathing techniques, such as pursed lip breathing or other method taught during program session;Compliance with respiratory medication;Demonstrates proper use of MDI's    Comments  Patient is doing well in [place] and wants to get better with her breathing. She is taking Advair and Symbicort to help with her lung issues. She has not needed to use her rescue inhaler or her neblizer in [place]. She does not have a pulse oximeter to check her oxygen at home but does have a blood pressure cuff. Informed patient to obtain a pulse oximeter and where to get one.    Goals/Expected Outcomes  Short: obtain a pulse oximeter for home use. Long: Monitor oxygen and heart rate at home independently.       Initial Exercise Prescription: Initial Exercise Prescription - 03/21/18 1600      Date of Initial Exercise RX and Referring Provider   Date  03/21/18    Referring Provider  Flora Lipps MD      Treadmill   MPH  2    Grade  0    Minutes  1    METs  2.53      NuStep   Level  2    SPM  80    Minutes  15    METs  2.5      REL-XR   Level  1    Speed  50    Minutes  15    METs  2.5      Prescription Details   Frequency (times per week)  3    Duration  Progress to 45 minutes of aerobic exercise without signs/symptoms of physical distress      Intensity   THRR 40-80% of Max Heartrate  129-158    Ratings of Perceived Exertion  11-13    Perceived Dyspnea  0-4  Progression   Progression  Continue to progress workloads to maintain intensity without signs/symptoms of physical distress.      Resistance Training   Training Prescription  Yes    Weight  3 lbs    Reps  10-15       Perform Capillary Blood Glucose checks as needed.  Exercise Prescription Changes: Exercise Prescription Changes    Row  Name 03/21/18 1500 04/07/18 1100 04/11/18 1100 04/21/18 1000 05/04/18 1500     Response to Exercise   Blood Pressure (Admit)  126/64  122/80  -  124/70  100/58   Blood Pressure (Exercise)  144/72  -  -  -  -   Blood Pressure (Exit)  126/64  110/60  -  100/60  102/64   Heart Rate (Admit)  99 bpm  107 bpm  -  124 bpm  110 bpm   Heart Rate (Exercise)  132 bpm  121 bpm  -  131 bpm  128 bpm   Heart Rate (Exit)  109 bpm  99 bpm  -  110 bpm  108 bpm   Oxygen Saturation (Admit)  98 %  98 %  -  98 %  98 %   Oxygen Saturation (Exercise)  95 %  98 %  -  98 %  95 %   Oxygen Saturation (Exit)  98 %  98 %  -  97 %  95 %   Rating of Perceived Exertion (Exercise)  11  15  -  13  14   Perceived Dyspnea (Exercise)  3  2  -  1  1   Symptoms  SOB  -  -  -  -   Comments  walk test results  -  -  -  -   Duration  -  Progress to 45 minutes of aerobic exercise without signs/symptoms of physical distress  -  Progress to 45 minutes of aerobic exercise without signs/symptoms of physical distress  Continue with 45 min of aerobic exercise without signs/symptoms of physical distress.   Intensity  -  THRR unchanged  -  THRR unchanged  THRR unchanged     Progression   Progression  -  Continue to progress workloads to maintain intensity without signs/symptoms of physical distress.  Continue to progress workloads to maintain intensity without signs/symptoms of physical distress.  Continue to progress workloads to maintain intensity without signs/symptoms of physical distress.  Continue to progress workloads to maintain intensity without signs/symptoms of physical distress.   Average METs  -  3.3  3.3  3.7  3.4     Resistance Training   Training Prescription  -  Yes  Yes  Yes  Yes   Weight  -  3 lb  3 lb  3 lb  3 lb   Reps  -  10-15  10-15  10-15  10-15     Interval Training   Interval Training  -  No  No  No  No     Treadmill   MPH  -  2  2  2  2    Grade  -  0  0  0  0.5   Minutes  -  15  15  15  15    METs  -  2.53   2.53  2.53  2.67     NuStep   Level  -  2  2  -  2   SPM  -  80  80  -  80   Minutes  -  15  15  -  15   METs  -  2.2  2.2  -  2.7     REL-XR   Level  -  1  1  3  3    Speed  -  50  50  50  50   Minutes  -  15  15  15  15    METs  -  5.5  5.5  -  4.1     Home Exercise Plan   Plans to continue exercise at  -  -  Home (comment) walking at home, considering a community gym  Home (comment) walking at home, considering a community gym  Home (comment) walking at home, considering a community gym   Frequency  -  -  Add 2 additional days to program exercise sessions.  Add 2 additional days to program exercise sessions.  Add 2 additional days to program exercise sessions.   Initial Home Exercises Provided  -  -  04/11/18  04/11/18  04/11/18   Row Name 05/19/18 1000             Response to Exercise   Blood Pressure (Admit)  102/58       Blood Pressure (Exit)  100/74       Heart Rate (Admit)  102 bpm       Heart Rate (Exercise)  123 bpm       Heart Rate (Exit)  99 bpm       Oxygen Saturation (Admit)  97 %       Oxygen Saturation (Exercise)  95 %       Oxygen Saturation (Exit)  97 %       Rating of Perceived Exertion (Exercise)  14       Perceived Dyspnea (Exercise)  0       Duration  Continue with 45 min of aerobic exercise without signs/symptoms of physical distress.       Intensity  THRR unchanged         Progression   Progression  Continue to progress workloads to maintain intensity without signs/symptoms of physical distress.       Average METs  3.3         Resistance Training   Training Prescription  Yes       Weight  3 lb       Reps  10-15         Interval Training   Interval Training  No         Treadmill   MPH  2       Grade  0.5       Minutes  15       METs  2.67         NuStep   Level  2       SPM  80       Minutes  15       METs  2.5         REL-XR   Level  3       Speed  50       Minutes  15       METs  4.8         Home Exercise Plan   Plans to  continue exercise at  Home (comment) walking at home, considering a community gym       Frequency  Add 2 additional days to program exercise  sessions.       Initial Home Exercises Provided  04/11/18          Exercise Comments: Exercise Comments    Row Name 03/30/18 1024           Exercise Comments   First full day of exercise!  Patient was oriented to gym and equipment including functions, settings, policies, and procedures.  Patient's individual exercise prescription and treatment plan were reviewed.  All starting workloads were established based on the results of the 6 minute walk test done at initial orientation visit.  The plan for exercise progression was also introduced and progression will be customized based on patient's performance and goals.          Exercise Goals and Review: Exercise Goals    Row Name 03/21/18 1534             Exercise Goals   Increase Physical Activity  Yes       Intervention  Provide advice, education, support and counseling about physical activity/exercise needs.;Develop an individualized exercise prescription for aerobic and resistive training based on initial evaluation findings, risk stratification, comorbidities and participant's personal goals.       Expected Outcomes  Short Term: Attend rehab on a regular basis to increase amount of physical activity.;Long Term: Add in home exercise to make exercise part of routine and to increase amount of physical activity.;Long Term: Exercising regularly at least 3-5 days a week.       Increase Strength and Stamina  Yes       Intervention  Provide advice, education, support and counseling about physical activity/exercise needs.;Develop an individualized exercise prescription for aerobic and resistive training based on initial evaluation findings, risk stratification, comorbidities and participant's personal goals.       Expected Outcomes  Short Term: Increase workloads from initial exercise prescription for  resistance, speed, and METs.;Short Term: Perform resistance training exercises routinely during rehab and add in resistance training at home;Long Term: Improve cardiorespiratory fitness, muscular endurance and strength as measured by increased METs and functional capacity (6MWT)       Able to understand and use rate of perceived exertion (RPE) scale  Yes       Intervention  Provide education and explanation on how to use RPE scale       Expected Outcomes  Short Term: Able to use RPE daily in rehab to express subjective intensity level;Long Term:  Able to use RPE to guide intensity level when exercising independently       Able to understand and use Dyspnea scale  Yes       Intervention  Provide education and explanation on how to use Dyspnea scale       Expected Outcomes  Short Term: Able to use Dyspnea scale daily in rehab to express subjective sense of shortness of breath during exertion;Long Term: Able to use Dyspnea scale to guide intensity level when exercising independently       Knowledge and understanding of Target Heart Rate Range (THRR)  Yes       Intervention  Provide education and explanation of THRR including how the numbers were predicted and where they are located for reference       Expected Outcomes  Short Term: Able to state/look up THRR;Short Term: Able to use daily as guideline for intensity in rehab;Long Term: Able to use THRR to govern intensity when exercising independently       Able to check pulse independently  Yes  Intervention  Provide education and demonstration on how to check pulse in carotid and radial arteries.;Review the importance of being able to check your own pulse for safety during independent exercise       Expected Outcomes  Short Term: Able to explain why pulse checking is important during independent exercise;Long Term: Able to check pulse independently and accurately       Understanding of Exercise Prescription  Yes       Intervention  Provide education,  explanation, and written materials on patient's individual exercise prescription       Expected Outcomes  Short Term: Able to explain program exercise prescription;Long Term: Able to explain home exercise prescription to exercise independently          Exercise Goals Re-Evaluation : Exercise Goals Re-Evaluation    Row Name 03/30/18 1024 04/07/18 1107 04/11/18 1036 04/21/18 1059 05/04/18 1515     Exercise Goal Re-Evaluation   Exercise Goals Review  Increase Physical Activity;Increase Strength and Stamina;Able to understand and use rate of perceived exertion (RPE) scale;Able to understand and use Dyspnea scale  Increase Physical Activity;Able to understand and use rate of perceived exertion (RPE) scale;Increase Strength and Stamina;Able to understand and use Dyspnea scale  Increase Physical Activity;Able to understand and use rate of perceived exertion (RPE) scale;Increase Strength and Stamina;Able to understand and use Dyspnea scale;Knowledge and understanding of Target Heart Rate Range (THRR);Able to check pulse independently;Understanding of Exercise Prescription  Increase Physical Activity;Increase Strength and Stamina;Able to understand and use rate of perceived exertion (RPE) scale;Able to understand and use Dyspnea scale  Increase Physical Activity;Increase Strength and Stamina;Able to understand and use rate of perceived exertion (RPE) scale;Able to understand and use Dyspnea scale   Comments  Reviewed RPE scale, THR and program prescription with pt today.  Pt voiced understanding and was given a copy of goals to take home.   Breelynn is tolerating exercise well. Staff will monitor progress.  Reviewed home exercise with pt today.  Pt plans to walk at home and is considering a community gym for exercise.  Reviewed THR, pulse, RPE, sign and symptoms, NTG use, and when to call 911 or MD.  Also discussed weather considerations and indoor options.  Pt voiced understanding.  Jadira tolerates exercise well and  has moved resistance up on XR and NS.  Staff will monitor progress.  Cortina tolerates exercise well and has maintained good O2 sats. Staff will monitor progress.   Expected Outcomes  Short: Use RPE daily to regulate intensity. Long: Follow program prescription in THR.  Short - attend 3 days per week Long - improve MET level  Short: add 2-3 days of exercise outside of class. Long: Become independent with exercise program.   Short - increase TM grade Long - increase overall MET level  Short - continue to attend consistently Long - increase MET level   Row Name 05/19/18 1024             Exercise Goal Re-Evaluation   Exercise Goals Review  Increase Physical Activity;Increase Strength and Stamina;Able to understand and use rate of perceived exertion (RPE) scale;Able to understand and use Dyspnea scale       Comments  Makyiah works in target HR and RPE range.  She has improved stamina overall.  She would see further improvement with consistent attendance.       Expected Outcomes  Short - attend LW consistently Long - improve MET level          Discharge Exercise  Prescription (Final Exercise Prescription Changes): Exercise Prescription Changes - 05/19/18 1000      Response to Exercise   Blood Pressure (Admit)  102/58    Blood Pressure (Exit)  100/74    Heart Rate (Admit)  102 bpm    Heart Rate (Exercise)  123 bpm    Heart Rate (Exit)  99 bpm    Oxygen Saturation (Admit)  97 %    Oxygen Saturation (Exercise)  95 %    Oxygen Saturation (Exit)  97 %    Rating of Perceived Exertion (Exercise)  14    Perceived Dyspnea (Exercise)  0    Duration  Continue with 45 min of aerobic exercise without signs/symptoms of physical distress.    Intensity  THRR unchanged      Progression   Progression  Continue to progress workloads to maintain intensity without signs/symptoms of physical distress.    Average METs  3.3      Resistance Training   Training Prescription  Yes    Weight  3 lb    Reps  10-15       Interval Training   Interval Training  No      Treadmill   MPH  2    Grade  0.5    Minutes  15    METs  2.67      NuStep   Level  2    SPM  80    Minutes  15    METs  2.5      REL-XR   Level  3    Speed  50    Minutes  15    METs  4.8      Home Exercise Plan   Plans to continue exercise at  Home (comment)   walking at home, considering a community gym   Frequency  Add 2 additional days to program exercise sessions.    Initial Home Exercises Provided  04/11/18       Nutrition:  Target Goals: Understanding of nutrition guidelines, daily intake of sodium <1535m, cholesterol <2027m calories 30% from fat and 7% or less from saturated fats, daily to have 5 or more servings of fruits and vegetables.  Biometrics: Pre Biometrics - 03/21/18 1534      Pre Biometrics   Height  4' 11.4" (1.509 m)    Weight  125 lb 3.2 oz (56.8 kg)    Waist Circumference  28 inches    Hip Circumference  37 inches    Waist to Hip Ratio  0.76 %    BMI (Calculated)  24.94    Single Leg Stand  3.8 seconds        Nutrition Therapy Plan and Nutrition Goals: Nutrition Therapy & Goals - 03/21/18 1527      Personal Nutrition Goals   Nutrition Goal  Meet with the dietician    Personal Goal #2  Lose some weight    Personal Goal #3  come up with a budget for eating    Comments  Cost is a factor in her diet. She cant afford much and wants to learn how to eat better on a budget.      Intervention Plan   Intervention  Prescribe, educate and counsel regarding individualized specific dietary modifications aiming towards targeted core components such as weight, hypertension, lipid management, diabetes, heart failure and other comorbidities.;Nutrition handout(s) given to patient.    Expected Outcomes  Short Term Goal: Understand basic principles of dietary content, such as calories, fat,  sodium, cholesterol and nutrients.;Long Term Goal: Adherence to prescribed nutrition plan.       Nutrition  Assessments: Nutrition Assessments - 03/21/18 1523      MEDFICTS Scores   Pre Score  39       Nutrition Goals Re-Evaluation: Nutrition Goals Re-Evaluation    [place] Name 04/15/18 1043             Goals   Current Weight  128 lb (58.1 kg)       Nutrition Goal  Meet with the [place]. Eat better       Comment  She would like to learn how to eat better and what to eat to help her breathing. She has been trying to change her diet habits but has not had great success.       Expected Outcome  Short: meet with the dietician. Long: adhere to a diet plane.          Nutrition Goals Discharge (Final Nutrition Goals Re-Evaluation): Nutrition Goals Re-Evaluation - 04/15/18 1043      Goals   Current Weight  128 lb (58.1 kg)    Nutrition Goal  Meet with the [place]. Eat better    Comment  She would like to learn how to eat better and what to eat to help her breathing. She has been trying to change her diet habits but has not had great success.    Expected Outcome  Short: meet with the dietician. Long: adhere to a diet plane.       Psychosocial: Target Goals: Acknowledge presence or absence of significant depression and/or stress, maximize coping skills, provide positive support system. Participant is able to verbalize types and ability to use techniques and skills needed for reducing stress and depression.   Initial Review & Psychosocial Screening: Initial Psych Review & Screening - 03/21/18 1520      Initial Review   Current issues with  Current Psychotropic Meds;Current Depression;Current Sleep Concerns;Current Stress Concerns;History of Depression    Source of Stress Concerns  Chronic Illness;Financial;Poor Coping Skills;Unable to participate in former interests or hobbies    Comments  Her lung disease and financial are the main concerns of her drepression.      Family Dynamics   Good Support System?  No    Strains  Intra-family strains    Comments  She does not rely on family for  support but can talk to her boyfriend for support.      Barriers   Psychosocial barriers to participate in program  The patient should benefit from training in stress management and relaxation.;Psychosocial barriers identified (see note)      Screening Interventions   Interventions  Encouraged to exercise;To provide support and resources with identified psychosocial needs;Program counselor consult;Provide feedback about the scores to participant    Expected Outcomes  Short Term goal: Utilizing psychosocial counselor, staff and physician to assist with identification of specific Stressors or current issues interfering with healing process. Setting desired goal for each stressor or current issue identified.;Long Term Goal: Stressors or current issues are controlled or eliminated.;Short Term goal: Identification and review with participant of any Quality of Life or Depression concerns found by scoring the questionnaire.;Long Term goal: The participant improves quality of Life and PHQ9 Scores as seen by post scores and/or verbalization of changes       Quality of Life Scores:  Scores of 19 and below usually indicate a poorer quality of life in these areas.  A difference of  2-3 points  is a clinically meaningful difference.  A difference of 2-3 points in the total score of the Quality of Life Index has been associated with significant improvement in overall quality of life, self-image, physical symptoms, and general health in studies assessing change in quality of life.  PHQ-9: Recent Review Flowsheet Data    Depression screen [facility] 2/9 03/21/2018 12/24/2017   Decreased Interest 1 2   Down, Depressed, Hopeless 2 2   PHQ - 2 Score 3 4   Altered sleeping 2 2   Tired, decreased energy 2 2   Change in appetite 2 0   Feeling bad or failure about yourself  2 1   Trouble concentrating 3 2   Moving slowly or fidgety/restless 0 2   Suicidal thoughts 0 0   PHQ-9 Score 14 13   Difficult doing work/chores  Somewhat difficult Somewhat difficult      Interpretation of Total Score  Total Score Depression Severity:  1-4 = Minimal depression, 5-9 = Mild depression, 10-14 = Moderate depression, 15-19 = Moderately severe depression, 20-27 = Severe depression   Psychosocial Evaluation and Intervention: Psychosocial Evaluation - 03/30/18 1112      Psychosocial Evaluation & Interventions   Interventions  Encouraged to exercise with the program and follow exercise prescription;Stress management education;Relaxation education    Comments  Counselor met with Ms. Danelle Earthly  Art) today for initial psychosocial evaluation.  She is a 47 year old who reports having a "hole in her lungs" subsequent to double pneumonia ~1 year ago.  Nyjah has a limited support system with a boyfriend and a friend and reports she has a sister locally but most of her family relationships are conflictual.  Unnamed has (2) children that she states are "distant" from her.  She reports not sleeping well (maybe 4 hours per night for a long time) and her appetite "sucks!"  Maansi has a history of depression and anxiety and is on medication for the past 5 months that she reports is helpful. Sonnet has multiple stressors with her health; finances and conflict in her family relationships.  She has goals to sleep better; complete this program; and breathe better.  Counselor will be following with her.    Expected Outcomes  Short:  Shirly will attend class and exercise consistently to begin to breathe better and hopefully sleep better.  She will contact her Dr. about a possible sleep study if this does not improve soon.  Long:  Xochitl will develop a routine of exercise for stress and her overall health.    Continue Psychosocial Services   Follow up required by counselor       Psychosocial Re-Evaluation: Psychosocial Re-Evaluation    [place] Name 04/15/18 1034             Psychosocial Re-Evaluation   Current issues with  Current Stress  Concerns;Current Depression;History of Depression;Current Psychotropic Meds;Current Sleep Concerns       Comments  Patient has car issues and is stressing her out.  She has to work and getting to her appointments is hard. Her car is getting fixed from the water pump going out. She only works part time and is short on money. She likes to keep busy at home and wants to learn how to breath easier so she can do more at home and in the community. Informed her to deep breath and do relaxation techniques at home.       Expected Outcomes  Short: attend [facility] to decrease stress. Do some relaxation techniques  at home. Long: maintain exercise to decrease stress.       Interventions  Encouraged to attend Pulmonary Rehabilitation for the exercise;Relaxation education       Continue Psychosocial Services   Follow up required by staff          Psychosocial Discharge (Final Psychosocial Re-Evaluation): Psychosocial Re-Evaluation - 04/15/18 1034      Psychosocial Re-Evaluation   Current issues with  Current Stress Concerns;Current Depression;History of Depression;Current Psychotropic Meds;Current Sleep Concerns    Comments  Patient has car issues and is stressing her out.  She has to work and getting to her appointments is hard. Her car is getting fixed from the water pump going out. She only works part time and is short on money. She likes to keep busy at home and wants to learn how to breath easier so she can do more at home and in the community. Informed her to deep breath and do relaxation techniques at home.    Expected Outcomes  Short: attend [facility] to decrease stress. Do some relaxation techniques at home. Long: maintain exercise to decrease stress.    Interventions  Encouraged to attend Pulmonary Rehabilitation for the exercise;Relaxation education    Continue Psychosocial Services   Follow up required by staff       Education: Education Goals: Education classes will be provided on a weekly basis,  covering required topics. Participant will state understanding/return demonstration of topics presented.  Learning Barriers/Preferences: Learning Barriers/Preferences - 03/21/18 1531      Learning Barriers/Preferences   Learning Barriers  Sight    Learning Preferences  None       Education Topics:  Initial Evaluation Education: - Verbal, written and demonstration of respiratory meds, oximetry and breathing techniques. Instruction on use of nebulizers and MDIs and importance of monitoring MDI activations.   Pulmonary Rehab from 04/22/2018 in [facility] Cardiac and Pulmonary Rehab  Date  03/21/18  Educator  [facility]  Instruction Review Code  1- Verbalizes Understanding      General Nutrition Guidelines/Fats and Fiber: -Group instruction provided by verbal, written material, models and posters to present the general guidelines for heart healthy nutrition. Gives an explanation and review of dietary fats and fiber.   Controlling Sodium/Reading Food Labels: -Group verbal and written material supporting the discussion of sodium use in heart healthy nutrition. Review and explanation with models, verbal and written materials for utilization of the food label.   Exercise Physiology & General Exercise Guidelines: - Group verbal and written instruction with models to review the exercise physiology of the cardiovascular system and associated critical values. Provides general exercise guidelines with specific guidelines to those with heart or lung disease.    Aerobic Exercise & Resistance Training: - Gives group verbal and written instruction on the various components of exercise. Focuses on aerobic and resistive training programs and the benefits of this training and how to safely progress through these programs.   Flexibility, Balance, Mind/Body Relaxation: Provides group verbal/written instruction on the benefits of flexibility and balance training, including mind/body exercise modes such as yoga,  pilates and tai chi.  Demonstration and skill practice provided.   Stress and Anxiety: - Provides group verbal and written instruction about the health risks of elevated stress and causes of high stress.  Discuss the correlation between heart/lung disease and anxiety and treatment options. Review healthy ways to manage with stress and anxiety.   Pulmonary Rehab from 04/22/2018 in [facility] Cardiac and Pulmonary Rehab  Date  04/13/18  Educator  [place]  Instruction Review Code  1- Verbalizes Understanding      Depression: - Provides group verbal and written instruction on the correlation between heart/lung disease and depressed mood, treatment options, and the stigmas associated with seeking treatment.   Exercise & Equipment Safety: - Individual verbal instruction and demonstration of equipment use and safety with use of the equipment.   Pulmonary Rehab from 04/22/2018 in [facility] Cardiac and Pulmonary Rehab  Date  03/21/18  Educator  [facility]  Instruction Review Code  1- Verbalizes Understanding      Infection Prevention: - Provides verbal and written material to individual with discussion of infection control including proper hand washing and proper equipment cleaning during exercise session.   Pulmonary Rehab from 04/22/2018 in [facility] Cardiac and Pulmonary Rehab  Date  03/21/18  Educator  [facility]  Instruction Review Code  1- Verbalizes Understanding      Falls Prevention: - Provides verbal and written material to individual with discussion of falls prevention and safety.   Pulmonary Rehab from 04/22/2018 in [facility] Cardiac and Pulmonary Rehab  Date  03/21/18  Educator  [facility]  Instruction Review Code  1- Verbalizes Understanding      Diabetes: - Individual verbal and written instruction to review signs/symptoms of diabetes, desired ranges of glucose level fasting, after meals and with exercise. Advice that pre and post exercise glucose checks will be done for 3 sessions at entry of program.   Chronic Lung  Diseases: - Group verbal and written instruction to review updates, respiratory medications, advancements in procedures and treatments. Discuss use of supplemental oxygen including available portable oxygen systems, continuous and intermittent flow rates, concentrators, personal use and safety guidelines. Review proper use of inhaler and spacers. Provide informative websites for self-education.    Energy Conservation: - Provide group verbal and written instruction for methods to conserve energy, plan and organize activities. Instruct on pacing techniques, use of adaptive equipment and posture/positioning to relieve shortness of breath.   Triggers and Exacerbations: - Group verbal and written instruction to review types of environmental triggers and ways to prevent exacerbations. Discuss weather changes, air quality and the benefits of nasal washing. Review warning signs and symptoms to help prevent infections. Discuss techniques for effective airway clearance, coughing, and vibrations.   AED/CPR: - Group verbal and written instruction with the use of models to demonstrate the basic use of the AED with the basic ABC's of resuscitation.   Pulmonary Rehab from 04/22/2018 in [facility] Cardiac and Pulmonary Rehab  Date  04/22/18  Educator  [facility]  Instruction Review Code  1- Actuary and Physiology of the Lungs: - Group verbal and written instruction with the use of models to provide basic lung anatomy and physiology related to function, structure and complications of lung disease.   Anatomy & Physiology of the Heart: - Group verbal and written instruction and models provide basic cardiac anatomy and physiology, with the coronary electrical and arterial systems. Review of Valvular disease and Heart Failure   Cardiac Medications: - Group verbal and written instruction to review commonly prescribed medications for heart disease. Reviews the medication, class of the drug, and  side effects.   Pulmonary Rehab from 04/22/2018 in [facility] Cardiac and Pulmonary Rehab  Date  04/20/18  Educator  [facility]  Instruction Review Code  3- Needs Reinforcement      Know Your Numbers and Risk Factors: -Group verbal and written instruction about important numbers in your health.  Discussion of what  are risk factors and how they play a role in the disease process.  Review of Cholesterol, Blood Pressure, Diabetes, and BMI and the role they play in your overall health.   Sleep Hygiene: -Provides group verbal and written instruction about how sleep can affect your health.  Define sleep hygiene, discuss sleep cycles and impact of sleep habits. Review good sleep hygiene tips.    Other: -Provides group and verbal instruction on various topics (see comments)    Knowledge Questionnaire Score: Knowledge Questionnaire Score - 03/21/18 1532      Knowledge Questionnaire Score   Pre Score  10/18   reviewed with patient       Core Components/Risk Factors/Patient Goals at Admission: Personal Goals and Risk Factors at Admission - 03/21/18 1540      Core Components/Risk Factors/Patient Goals on Admission    Weight Management  Yes;Weight Loss    Intervention  Weight Management: Develop a combined nutrition and exercise program designed to reach desired caloric intake, while maintaining appropriate intake of nutrient and fiber, sodium and fats, and appropriate energy expenditure required for the weight goal.;Weight Management: Provide education and appropriate resources to help participant work on and attain dietary goals.;Weight Management/Obesity: Establish reasonable short term and long term weight goals.    Admit Weight  125 lb 8 oz (56.9 kg)    Goal Weight: Short Term  120 lb (54.4 kg)    Goal Weight: Long Term  120 lb (54.4 kg)    Expected Outcomes  Short Term: Continue to assess and modify interventions until short term weight is achieved;Long Term: Adherence to nutrition and physical  activity/exercise program aimed toward attainment of established weight goal;Weight Maintenance: Understanding of the daily nutrition guidelines, which includes 25-35% calories from fat, 7% or less cal from saturated fats, less than 234m cholesterol, less than 1.5gm of sodium, & 5 or more servings of fruits and vegetables daily;Weight Loss: Understanding of general recommendations for a balanced deficit meal plan, which promotes 1-2 lb weight loss per week and includes a negative energy balance of [phone] kcal/d;Understanding recommendations for meals to include 15-35% energy as protein, 25-35% energy from fat, 35-60% energy from carbohydrates, less than 202mof dietary cholesterol, 20-35 gm of total fiber daily;Understanding of distribution of calorie intake throughout the day with the consumption of 4-5 meals/snacks    Improve shortness of breath with ADL's  Yes    Intervention  Provide education, individualized exercise plan and daily activity instruction to help decrease symptoms of SOB with activities of daily living.    Expected Outcomes  Short Term: Improve cardiorespiratory fitness to achieve a reduction of symptoms when performing ADLs;Long Term: Be able to perform more ADLs without symptoms or delay the onset of symptoms    Hypertension  Yes    Intervention  Provide education on lifestyle modifcations including regular physical activity/exercise, weight management, moderate sodium restriction and increased consumption of fresh fruit, vegetables, and low fat dairy, alcohol moderation, and smoking cessation.;Monitor prescription use compliance.    Expected Outcomes  Short Term: Continued assessment and intervention until BP is < 140/9065mG in hypertensive participants. < 130/40m39m in hypertensive participants with diabetes, heart failure or chronic kidney disease.;Long Term: Maintenance of blood pressure at goal levels.    Lipids  Yes    Intervention  Provide education and support for  participant on nutrition & aerobic/resistive exercise along with prescribed medications to achieve LDL <70mg84mL >40mg.13mExpected Outcomes  Short Term: Participant states understanding  of desired cholesterol values and is compliant with medications prescribed. Participant is following exercise prescription and nutrition guidelines.;Long Term: Cholesterol controlled with medications as prescribed, with individualized exercise RX and with personalized nutrition plan. Value goals: LDL < 81m, HDL > 40 mg.       Core Components/Risk Factors/Patient Goals Review:  Goals and Risk Factor Review    Row Name 04/15/18 1045             Core Components/Risk Factors/Patient Goals Review   Personal Goals Review  Improve shortness of breath with ADL's;Weight Management/Obesity;Hypertension;Lipids       Review  Patients breathing has improved slightly since she has started the program. Her blood pressure has been within normal limits. She has had her lipids checked about 4 months ago. She needs to watch what she eats for her cholesterol.        Expected Outcomes  Short: Attend [facility] regularly to improve shortness of breath with ADL's. Long: maintain independence with ADL's           Core Components/Risk Factors/Patient Goals at Discharge (Final Review):  Goals and Risk Factor Review - 04/15/18 1045      Core Components/Risk Factors/Patient Goals Review   Personal Goals Review  Improve shortness of breath with ADL's;Weight Management/Obesity;Hypertension;Lipids    Review  Patients breathing has improved slightly since she has started the program. Her blood pressure has been within normal limits. She has had her lipids checked about 4 months ago. She needs to watch what she eats for her cholesterol.     Expected Outcomes  Short: Attend [facility] regularly to improve shortness of breath with ADL's. Long: maintain independence with ADL's        ITP Comments: ITP Comments    Row Name 03/21/18 1418  04/04/18 0854 04/22/18 1046 05/02/18 0830 05/30/18 0834   ITP Comments  Medical Evaluation completed. Chart sent for review and changes to Dr. MEmily FilbertDirector of [place] Diagnosis can be found in [facility] encounter 02/14/2018  30 day review completed. ITP sent to Dr. [place]mily FilbertDirector of [place] Continue with ITP unless changes are made by physician  Pt reports that she has not been feeling well.   30 day review completed. ITP sent to Dr. MEmily FilbertDirector of [facility] Continue with ITP unless changes are made by physician  patient has trouble with transportation and has not been able to attend class regularly. 30 day review completed. ITP sent to Dr. MEmily FilbertDirector of [place] Continue with ITP unless changes are made by physician   Row Name 06/06/18 1020 06/06/18 1411         ITP Comments  Called to check on status of return.  Left message on voicemail again.   Discharge ITP sent and signed by Dr. MSabra Heck  Discharge Summary routed to PCP and pulmonologist.         Comments: Discharge ITP

## 2018-06-07 ENCOUNTER — Encounter: Payer: Self-pay | Admitting: *Deleted

## 2018-06-07 ENCOUNTER — Ambulatory Visit: Payer: Medicaid Other | Admitting: Gastroenterology

## 2018-06-07 DIAGNOSIS — K219 Gastro-esophageal reflux disease without esophagitis: Secondary | ICD-10-CM

## 2018-06-07 NOTE — Progress Notes (Deleted)
Summary of history :  She has a history of sarcoidosis.CT scan of the chest in 03/2016 showed pulmonary involvement with sarcoidosis . She has had a history of reflux for some years , symptoms of heartburn 3 times a week .Some dysphagia H/o constipation over 20 years. Never had a colonoscopy. IBS-C symptoms.   Interval history   04/06/2018-  06/07/2018      Jennifer [name] is a 47 y.o. y/o female has been referred for GERD. She also has dysphagia which is seasonal. She may have EOE. Or Dysphagia secondary to GERD . Also has long standing constipation    Plan  1. Stop Zantac, commence on Prilosec 40 mg once a day  2. EGD+/- dilation - r/o EOE. Presently has a bad cough - will avoid EGD for a few weeks and wait for this acute episode to resolve.  3. GERD patient information. Counseled on life style changes.  4. Pulmonary clearance for procedures as she has significant cough  5. Trial of linzess 290 mcg - 2 weeks samples provided. High fiber diet patient information

## 2018-06-08 ENCOUNTER — Encounter: Payer: Medicaid Other | Attending: Family Medicine

## 2018-06-08 DIAGNOSIS — R635 Abnormal weight gain: Secondary | ICD-10-CM | POA: Insufficient documentation

## 2018-06-16 ENCOUNTER — Ambulatory Visit (INDEPENDENT_AMBULATORY_CARE_PROVIDER_SITE_OTHER): Payer: Medicaid Other | Admitting: Internal Medicine

## 2018-06-16 ENCOUNTER — Encounter: Payer: Self-pay | Admitting: Internal Medicine

## 2018-06-16 ENCOUNTER — Encounter: Payer: Self-pay | Admitting: *Deleted

## 2018-06-16 VITALS — BP 106/69 | HR 90 | Ht <= 58 in | Wt 120.5 lb

## 2018-06-16 DIAGNOSIS — I493 Ventricular premature depolarization: Secondary | ICD-10-CM

## 2018-06-16 DIAGNOSIS — R0789 Other chest pain: Secondary | ICD-10-CM | POA: Diagnosis not present

## 2018-06-16 DIAGNOSIS — R0602 Shortness of breath: Secondary | ICD-10-CM | POA: Diagnosis not present

## 2018-06-16 NOTE — Progress Notes (Signed)
Follow-up Outpatient Visit Date: 06/16/2018  Primary Care Provider: Judeen Hammans, MD [address] [place] [address]  Chief Complaint: Follow-up PVC's and shortness of breath  HPI:  Jennifer [name] is a 47 y.o. year-old female with history of  frequent PVCs, sarcoidosis, fibromyalgia, asthma, and anxiety, who presents for follow-up of frequent PVC's.  I last saw Jennifer [name] in July, at which time she reported intermittent chest discomfort that she described as "getting hit in the chest for a second."  Pain was associated with coughing.  She had stable 3 pillow orthopnea as well as exertional dyspnea.  We discussed further evaluation and agreed to obtain a cardiac CTA, which we had previously discussed as well.  It appears that study has yet to be performed.  Metoprolol was increased to 100 mg twice daily.  Today, Jennifer [name] reports that she has been coughing more over the last two weeks.  She reports bringing up mucous and have a borderline fever of 100.4 degrees Fahrenheit.  She notes that her chest hurts when she coughs a lot.  She also feels lightheaded after prolonged coughing.  Chronic shortness of breath is unchanged, as is 3-pillow orthopnea.  She denies palpitations.  She has occasional dependent leg edema when standing for prolonged periods.  --------------------------------------------------------------------------------------------------  Cardiovascular History & Procedures: Cardiovascular Problems:  Frequent PVCs  Atypical chest pain and shortness of breath (history of pulmonary sarcoidosis)  Risk Factors:  None  Cath/PCI:  None  CV Surgery:  None  EP Procedures and Devices:  24-hour Holter monitor (03/26/17): Predominantly sinus rhythm with frequent PVCs (6% burden; previously 13%). No VT or other sustained arrhythmia.  24-hour Holter monitor (06/29/16): Predominantly sinus rhythm with frequent PVCs and rare PACs. PVC burden was  13%.  Non-Invasive Evaluation(s):  TTE (09/20/2017): Normal LV size and wall thickness.  LVEF 55 to 60% with normal wall motion and diastolic function.  Normal RV size and function.  Normal PA pressure.  TTE (06/29/16): Normal LV size with LVEF of 50-55%. Normal wall motion and diastolic function. Mild MR. Normal RV size and function. Normal pulmonary artery pressure.  Recent CV Pertinent Labs: Lab Results  Component Value Date   K 3.9 11/20/2017   K 4.0 11/28/2014   BUN 10 11/20/2017   BUN < 5 (L) 11/28/2014   CREATININE 0.71 11/20/2017   CREATININE 0.67 11/28/2014    Past medical and surgical history were reviewed and updated in EPIC.  Current Meds  Medication Sig  . acetaminophen (TYLENOL) 500 MG tablet Take 500 mg by mouth every 6 (six) hours as needed.  Marland Kitchen ADVAIR DISKUS 250-50 MCG/DOSE AEPB Inhale 1 puff into the lungs as directed. (Patient taking differently: Inhale 1 puff into the lungs 2 (two) times daily. )  . albuterol (ACCUNEB) 1.25 MG/3ML nebulizer solution Take 1 ampule by nebulization every 6 (six) hours as needed for wheezing.  . Azelastine-Fluticasone 137-50 MCG/ACT SUSP Place 1 spray into the nose 2 (two) times daily.  . cetirizine (ZYRTEC) 10 MG tablet Take 1 tablet (10 mg total) by mouth daily.  . diclofenac (VOLTAREN) 75 MG EC tablet Take 1 tablet (75 mg total) by mouth 2 (two) times daily.  . fluticasone (FLONASE) 50 MCG/ACT nasal spray Place 2 sprays into both nostrils daily.  Marland Kitchen glycerin adult 2 g suppository Place 1 suppository rectally as needed for constipation.  Marland Kitchen ibuprofen (ADVIL,MOTRIN) 400 MG tablet Take 400 mg by mouth every 6 (six) hours as needed.  . lactulose (  CHRONULAC) 10 GM/15ML solution Take 10 g by mouth 2 (two) times daily.  . metoprolol tartrate (LOPRESSOR) 100 MG tablet Take 1 tablet (100 mg total) by mouth 2 (two) times daily.  Marland Kitchen omeprazole (PRILOSEC) 40 MG capsule Take 1 capsule (40 mg total) by mouth daily.  . ondansetron (ZOFRAN) 4 MG  tablet Take 4 mg by mouth every 6 (six) hours as needed.  . polyethylene glycol powder (GLYCOLAX/MIRALAX) powder Take 17 g by mouth as needed.  . predniSONE (DELTASONE) 5 MG tablet take 2 tablets by mouth once daily with BREAKFAST  . PROAIR HFA 108 (90 BASE) MCG/ACT inhaler Inhale 108 mcg into the lungs every 6 (six) hours as needed.  . Probiotic Product (CULTURELLE IMMUNE DEFENSE PO) Take 1 capsule by mouth daily.  . pseudoephedrine (SUDAFED) 60 MG tablet Take 1 tablet (60 mg total) by mouth every 4 (four) hours as needed for congestion.  . ranitidine (ZANTAC) 75 MG tablet Take 75 mg by mouth 2 (two) times daily.  Marland Kitchen SAPHRIS 5 MG SUBL 24 hr tablet Take 5 mg by mouth daily.  . Vitamin D, Cholecalciferol, 1000 units TABS Take 500 Units by mouth daily.   Marland Kitchen voriconazole (VFEND) 200 MG tablet Take 200 mg by mouth 2 (two) times daily.    Allergies: Penicillin g  Social History   Tobacco Use  . Smoking status: Never Smoker  . Smokeless tobacco: Never Used  Substance Use Topics  . Alcohol use: No    Alcohol/week: 0.0 standard drinks  . Drug use: No    Family History  Problem Relation Age of Onset  . Lung cancer Mother     Review of Systems: A 12-system review of systems was performed and was negative except as noted in the HPI.  --------------------------------------------------------------------------------------------------  Physical Exam: BP 106/69 (BP Location: Left Arm, Patient Position: Sitting, Cuff Size: Normal)   Pulse 90   Ht 4\' 9"  (1.448 m)   Wt 120 lb 8 oz (54.7 kg)   BMI 26.08 kg/m   General:  NAD HEENT: No conjunctival pallor or scleral icterus. Moist mucous membranes.  OP clear. Neck: Supple without lymphadenopathy, thyromegaly, JVD, or HJR. No carotid bruit. Lungs: Normal work of breathing. Coarse breath sounds bilaterally without wheezes or crackles. Heart: Regular rate and rhythm without murmurs.  Split S2 noted. Abd: Bowel sounds present. Soft, NT/ND without  hepatosplenomegaly Ext: No lower extremity edema. Radial, PT, and DP pulses are 2+ bilaterally. Skin: Warm and dry without rash.  EKG:  NSR with PVC's and left atrial enlargement.  Lab Results  Component Value Date   WBC 5.4 11/20/2017   HGB 14.2 11/20/2017   HCT 42.4 11/20/2017   MCV 78.3 (L) 11/20/2017   PLT 233 11/20/2017    Lab Results  Component Value Date   NA 137 11/20/2017   K 3.9 11/20/2017   CL 105 11/20/2017   CO2 23 11/20/2017   BUN 10 11/20/2017   CREATININE 0.71 11/20/2017   GLUCOSE 119 (H) 11/20/2017    No results found for: CHOL, HDL, LDLCALC, LDLDIRECT, TRIG, CHOLHDL  --------------------------------------------------------------------------------------------------  ASSESSMENT AND PLAN: PVC's Asymptomatic.  Continue metoprolol tartrate 100 mg BID.  May need to consider cardiac MRI in the future, given history of sarcoidosis.  I will check a BMP and TSH today.  Chest pain Atypical and most likely due to increased coughing recently.  Given frequent PVC's I think it is important to exclude ischemia, as previously recommended.  Jennifer [name] is agreeable to proceed with  cardiac CTA.  Shortness of breath Most likely due to pulmonary sarcoidosis.  Jennifer [name] is at risk for heart failure due to infiltrative cardiomyopathy as well as pulmonary hypertension.  Split S2 on exam raises possibility of pulmonary hypertension, though PA pressure was normal on echo in 09/2017.  Cardiac CTA will also allow [place] to reevaluate her lung parenchyma.  Jennifer [name] should continue to follow-up with Dr. Belia Heman; she should contact him if her cough or dyspnea worsen.  I will check a CBC today to evaluate for anemia and leukocytosis.  Follow-up: Return to clinic in 6 months.  Yvonne Kendall, MD 06/16/2018 11:02 AM

## 2018-06-16 NOTE — Patient Instructions (Signed)
Medication Instructions:  Your physician recommends that you continue on your current medications as directed. Please refer to the Current Medication list given to you today.  If you need a refill on your cardiac medications before your next appointment, please call your pharmacy.   Lab work: Your physician recommends that you return for lab work in: TODAY - CBC, BMET, TSH.  If you have labs (blood work) drawn today and your tests are completely normal, you will receive your results only by: Marland Kitchen MyChart Message (if you have MyChart) OR . A paper copy in the mail If you have any lab test that is abnormal or we need to change your treatment, we will call you to review the results.  Testing/Procedures: CORONARY CT Angiography (CTA), is a special type of CT scan that uses a computer to produce multi-dimensional views of major blood vessels throughout the body. In CT angiography, a contrast material is injected through an IV to help visualize the blood vessels  See letter for full details on instructions.  Follow-Up: At [facility], you and your health needs are our priority.  As part of our continuing mission to provide you with exceptional heart care, we have created designated Provider Care Teams.  These Care Teams include your primary Cardiologist (physician) and Advanced Practice Providers (APPs -  Physician Assistants and Nurse Practitioners) who all work together to provide you with the care you need, when you need it. You will need a follow up appointment in 6 months.  Please call our office 2 months in advance to schedule this appointment.  You may see Dr Cristal Deer End or one of the following Advanced Practice Providers on your designated Care Team:   Nicolasa Ducking, NP Eula Listen, PA-C . Marisue Ivan, PA-C  Any Other Special Instructions Will Be Listed Below (If Applicable).  If you cough does not improve, please contact Dr Belia Heman.

## 2018-06-17 ENCOUNTER — Telehealth: Payer: Self-pay | Admitting: *Deleted

## 2018-06-17 LAB — TSH: TSH: <0.006 u[IU]/mL — ABNORMAL LOW (ref 0.450–4.500)

## 2018-06-17 LAB — CBC WITH DIFFERENTIAL/PLATELET
Basophils Absolute: 0 10*3/uL (ref 0.0–0.2)
Basos: 1 %
EOS (ABSOLUTE): 0.1 10*3/uL (ref 0.0–0.4)
EOS: 2 %
HEMATOCRIT: 39.8 % (ref 34.0–46.6)
HEMOGLOBIN: 13 g/dL (ref 11.1–15.9)
Immature Grans (Abs): 0 10*3/uL (ref 0.0–0.1)
Immature Granulocytes: 0 %
LYMPHS ABS: 0.8 10*3/uL (ref 0.7–3.1)
Lymphs: 20 %
MCH: 23.7 pg — ABNORMAL LOW (ref 26.6–33.0)
MCHC: 32.7 g/dL (ref 31.5–35.7)
MCV: 73 fL — ABNORMAL LOW (ref 79–97)
MONOCYTES: 10 %
Monocytes Absolute: 0.4 10*3/uL (ref 0.1–0.9)
NEUTROS ABS: 2.9 10*3/uL (ref 1.4–7.0)
Neutrophils: 67 %
Platelets: 284 10*3/uL (ref 150–450)
RBC: 5.49 x10E6/uL — ABNORMAL HIGH (ref 3.77–5.28)
RDW: 13.3 % (ref 12.3–15.4)
WBC: 4.3 10*3/uL (ref 3.4–10.8)

## 2018-06-17 LAB — BASIC METABOLIC PANEL
BUN / CREAT RATIO: 8 — AB (ref 9–23)
BUN: 5 mg/dL — AB (ref 6–24)
CHLORIDE: 101 mmol/L (ref 96–106)
CO2: 20 mmol/L (ref 20–29)
Calcium: 9.5 mg/dL (ref 8.7–10.2)
Creatinine, Ser: 0.62 mg/dL (ref 0.57–1.00)
GFR calc non Af Amer: 108 mL/min/{1.73_m2} (ref >59)
GFR, EST AFRICAN AMERICAN: 124 mL/min/{1.73_m2} (ref >59)
Glucose: 160 mg/dL — ABNORMAL HIGH (ref 65–99)
Potassium: 3.7 mmol/L (ref 3.5–5.2)
Sodium: 137 mmol/L (ref 134–144)

## 2018-06-17 NOTE — Telephone Encounter (Signed)
Results called to pt. Pt verbalized understanding. She said she would call her PCP. She said she goes to [facility].   I called [facility] center and got fax number [phone] and faxed lab results.  They also said they would call the patient to schedule a follow up appointment.

## 2018-06-17 NOTE — Telephone Encounter (Signed)
Called [facility] and requested to add-on Free T4.  Order submitted, once they have pulled the specimen and verified ability to addon, they will send [place] a form to sign.

## 2018-06-17 NOTE — Telephone Encounter (Signed)
-----   Message from Yvonne Kendall, MD sent at 06/17/2018  7:16 AM EDT ----- Please let Jennifer [name] know that her BMP is normal.  He blood counts also look okay.  Her TSH is very low, suggesting hyperthyroidism.  Can we add on a free T4 and also fax the results to Dr. Sallee Lange' office?  I recommend that [name] speak with her PCP as soon as possible for further workup and treatment.

## 2018-06-20 NOTE — Telephone Encounter (Signed)
Labs with Free T4 results faxed to [facility].

## 2018-06-21 ENCOUNTER — Telehealth: Payer: Self-pay | Admitting: *Deleted

## 2018-06-21 NOTE — Telephone Encounter (Signed)
Received add-on order form for Free T4. Completed and signed by nurse. Faxed back to [facility].

## 2018-06-22 LAB — T4, FREE: Free T4: 3.21 ng/dL — ABNORMAL HIGH (ref 0.82–1.77)

## 2018-06-22 LAB — SPECIMEN STATUS REPORT

## 2018-07-13 ENCOUNTER — Telehealth: Payer: Self-pay | Admitting: *Deleted

## 2018-07-13 DIAGNOSIS — Z01810 Encounter for preprocedural cardiovascular examination: Secondary | ICD-10-CM

## 2018-07-13 DIAGNOSIS — R0602 Shortness of breath: Secondary | ICD-10-CM

## 2018-07-13 DIAGNOSIS — R0789 Other chest pain: Secondary | ICD-10-CM

## 2018-07-13 DIAGNOSIS — I493 Ventricular premature depolarization: Secondary | ICD-10-CM

## 2018-07-13 NOTE — Telephone Encounter (Signed)
Jennifer [name] Cv Div Burl Triage        Scheduled July 28, 2018 1610R patient needs an appointment to have lab work the week prior to the test.   She is also requesting directions to [facility] and to have her instructions for the Cardiac Ct mailed to her - as she threw the other copy away.

## 2018-07-13 NOTE — Telephone Encounter (Signed)
Spoke with patient. Patient had lab work on 06/16/18. She will need repeat BMET before the CT. Patient verbalized [facility]steanding [facility] for the lab work.  She also requested directions to [facility] as well as the letter to be mailed to her.  Verified address and printed directions off Google maps and mailed to patient.  BMET order entered.

## 2018-07-14 ENCOUNTER — Other Ambulatory Visit
Admission: RE | Admit: 2018-07-14 | Discharge: 2018-07-14 | Disposition: A | Discharge status: Home / Self Care | Payer: Medicaid Other | Source: Ambulatory Visit | Attending: Internal Medicine | Admitting: Internal Medicine

## 2018-07-14 DIAGNOSIS — R0602 Shortness of breath: Secondary | ICD-10-CM | POA: Insufficient documentation

## 2018-07-14 DIAGNOSIS — R0789 Other chest pain: Secondary | ICD-10-CM | POA: Diagnosis present

## 2018-07-14 DIAGNOSIS — Z01810 Encounter for preprocedural cardiovascular examination: Secondary | ICD-10-CM | POA: Diagnosis present

## 2018-07-14 DIAGNOSIS — I493 Ventricular premature depolarization: Secondary | ICD-10-CM | POA: Diagnosis not present

## 2018-07-14 LAB — BASIC METABOLIC PANEL
Anion gap: 6 (ref 5–15)
BUN: 7 mg/dL (ref 6–20)
CALCIUM: 9.2 mg/dL (ref 8.9–10.3)
CHLORIDE: 106 mmol/L (ref 98–111)
CO2: 25 mmol/L (ref 22–32)
CREATININE: 0.46 mg/dL (ref 0.44–1.00)
GFR calc non Af Amer: >60 mL/min (ref >60)
Glucose, Bld: 140 mg/dL — ABNORMAL HIGH (ref 70–99)
Potassium: 3.4 mmol/L — ABNORMAL LOW (ref 3.5–5.1)
SODIUM: 137 mmol/L (ref 135–145)

## 2018-07-28 ENCOUNTER — Encounter: Payer: Medicaid Other | Admitting: *Deleted

## 2018-07-28 ENCOUNTER — Ambulatory Visit (HOSPITAL_COMMUNITY)
Admission: RE | Admit: 2018-07-28 | Discharge: 2018-07-28 | Disposition: A | Discharge status: Home / Self Care | Payer: Medicaid Other | Source: Ambulatory Visit | Attending: Internal Medicine | Admitting: Internal Medicine

## 2018-07-28 ENCOUNTER — Ambulatory Visit (HOSPITAL_COMMUNITY): Payer: Medicaid Other

## 2018-07-28 DIAGNOSIS — R0789 Other chest pain: Secondary | ICD-10-CM | POA: Diagnosis present

## 2018-07-28 DIAGNOSIS — R0602 Shortness of breath: Secondary | ICD-10-CM

## 2018-07-28 DIAGNOSIS — Z006 Encounter for examination for normal comparison and control in clinical research program: Secondary | ICD-10-CM

## 2018-07-28 DIAGNOSIS — I493 Ventricular premature depolarization: Secondary | ICD-10-CM

## 2018-07-28 MED ORDER — DILTIAZEM HCL 25 MG/5ML IV SOLN
5.0000 mg | Freq: Once | INTRAVENOUS | Status: DC | PRN
  Administered 2018-07-28: 5 mg via INTRAVENOUS
  Filled 2018-07-28: qty 5

## 2018-07-28 MED ORDER — METOPROLOL TARTRATE 5 MG/5ML IV SOLN
INTRAVENOUS | Status: AC
  Administered 2018-07-28: 5 mg via INTRAVENOUS
  Filled 2018-07-28: qty 30

## 2018-07-28 MED ORDER — DILTIAZEM HCL 25 MG/5ML IV SOLN
INTRAVENOUS | Status: AC
  Administered 2018-07-28: 5 mg via INTRAVENOUS
  Filled 2018-07-28: qty 5

## 2018-07-28 MED ORDER — NITROGLYCERIN 0.4 MG SL SUBL
0.8000 mg | SUBLINGUAL_TABLET | Freq: Once | SUBLINGUAL | Status: DC
Start: 2018-07-28 — End: 2018-07-29
  Filled 2018-07-28: qty 25

## 2018-07-28 MED ORDER — METOPROLOL TARTRATE 5 MG/5ML IV SOLN
5.0000 mg | INTRAVENOUS | Status: DC | PRN
  Administered 2018-07-28 (×5): 5 mg via INTRAVENOUS
  Filled 2018-07-28 (×6): qty 5

## 2018-07-28 NOTE — Research (Signed)
Subject Name: Jennifer [name]  Subject met inclusion and exclusion criteria.  The informed consent form, study requirements and expectations were reviewed with the subject and questions and concerns were addressed prior to the signing of the consent form.  The subject verbalized understanding of the trial requirements.  The subject agreed to participate in the [email or web address] trial and signed the informed consent  on 07/28/18.  The informed consent was obtained prior to performance of any protocol-specific procedures for the subject.  A copy of the signed informed consent was given to the subject and a copy was placed in the subject's medical record.   [name]

## 2018-07-28 NOTE — Progress Notes (Signed)
Pt given 25mg  IV metoprolol and 5mg  IV cardizem per protocol to reduce pt's HR down to less than 65bpm for CT heart scan.  Pt's HR remains in the 80s-90s.  Dr. Eden EmmsNishan called and Dr. Eden EmmsNishan states that we should stop given medication and not do the scan today. Pt made aware and expressed understanding.

## 2018-08-02 ENCOUNTER — Telehealth: Payer: Self-pay | Admitting: Internal Medicine

## 2018-08-02 DIAGNOSIS — R0602 Shortness of breath: Secondary | ICD-10-CM

## 2018-08-02 DIAGNOSIS — R079 Chest pain, unspecified: Secondary | ICD-10-CM

## 2018-08-02 NOTE — Telephone Encounter (Signed)
Left message for pt to contact the [place] office to discuss alternate modalities for ischemia evaluation, given that cardiac CTA could not be performed due to excessive heart rate and PVC's.  I would favor a pharmacologic myocardial perfusion stress test as our next step.  Yvonne Kendallhristopher Pranavi Aure, MD [facility] Pager: [phone]

## 2018-08-02 NOTE — Telephone Encounter (Signed)
-----   Message from Wendall StadePeter C Nishan, MD sent at 07/28/2018 10:41 AM EST ----- Had to cancel cardiac CT too many PVC;s and unable to get HR below 90 despite 25 mg of iv lopressor and 5 mg of iv cardizem will need to schedule alternative test

## 2018-08-10 ENCOUNTER — Encounter: Payer: Self-pay | Admitting: *Deleted

## 2018-08-10 NOTE — Telephone Encounter (Signed)
Spoke with patient. She verbalized understanding of results and plan of care. She is agreeable to pharmacological stress test. Scheduled her for 08/22/18 at 0845 am. She verbalized understanding of instructions and asked that I mail them to her. I also put them on her MyChart.

## 2018-08-24 ENCOUNTER — Telehealth: Payer: Self-pay | Admitting: Internal Medicine

## 2018-08-24 DIAGNOSIS — Z01812 Encounter for preprocedural laboratory examination: Secondary | ICD-10-CM

## 2018-08-24 NOTE — Telephone Encounter (Signed)
Urine pregnancy test ordered.  I have notified nuclear medicine.

## 2018-08-24 NOTE — Telephone Encounter (Signed)
Pt needs pregnancy urine test orders entered into Epic. Pt is having Nuclear Medicine Stress Test tomorrow.

## 2018-08-25 ENCOUNTER — Encounter
Admission: RE | Admit: 2018-08-25 | Discharge: 2018-08-25 | Disposition: A | Discharge status: Home / Self Care | Payer: Medicaid Other | Source: Ambulatory Visit | Attending: Internal Medicine | Admitting: Internal Medicine

## 2018-08-25 DIAGNOSIS — R079 Chest pain, unspecified: Secondary | ICD-10-CM | POA: Diagnosis present

## 2018-08-25 DIAGNOSIS — R0602 Shortness of breath: Secondary | ICD-10-CM

## 2018-08-25 LAB — NM MYOCAR MULTI W/SPECT W/WALL MOTION / EF
CHL CUP NUCLEAR SSS: 0
CHL CUP RESTING HR STRESS: 100 {beats}/min
CSEPED: 0 min
CSEPEW: 1 METS
CSEPHR: 82 %
Exercise duration (sec): 0 s
LV sys vol: 22 mL
LVDIAVOL: 56 mL (ref 46–106)
MPHR: 173 {beats}/min
Peak HR: 142 {beats}/min
SDS: 0
SRS: 1
TID: 0.9

## 2018-08-25 MED ORDER — TECHNETIUM TC 99M TETROFOSMIN IV KIT
30.0000 | PACK | Freq: Once | INTRAVENOUS | Status: AC | PRN
  Administered 2018-08-25: 30.465 via INTRAVENOUS

## 2018-08-25 MED ORDER — TECHNETIUM TC 99M TETROFOSMIN IV KIT
10.0000 | PACK | Freq: Once | INTRAVENOUS | Status: AC | PRN
  Administered 2018-08-25: 10.39 via INTRAVENOUS

## 2018-08-25 MED ORDER — REGADENOSON 0.4 MG/5ML IV SOLN
0.4000 mg | Freq: Once | INTRAVENOUS | Status: AC
  Administered 2018-08-25: 0.4 mg via INTRAVENOUS

## 2018-09-08 ENCOUNTER — Encounter: Payer: Self-pay | Admitting: Internal Medicine

## 2018-09-08 ENCOUNTER — Ambulatory Visit (INDEPENDENT_AMBULATORY_CARE_PROVIDER_SITE_OTHER): Payer: Medicaid Other | Admitting: Internal Medicine

## 2018-09-08 VITALS — BP 112/80 | HR 113 | Resp 16 | Ht <= 58 in | Wt 118.0 lb

## 2018-09-08 DIAGNOSIS — R05 Cough: Secondary | ICD-10-CM | POA: Diagnosis not present

## 2018-09-08 DIAGNOSIS — D869 Sarcoidosis, unspecified: Secondary | ICD-10-CM

## 2018-09-08 DIAGNOSIS — R062 Wheezing: Secondary | ICD-10-CM | POA: Diagnosis not present

## 2018-09-08 DIAGNOSIS — R059 Cough, unspecified: Secondary | ICD-10-CM

## 2018-09-08 MED ORDER — ADVAIR DISKUS 250-50 MCG/DOSE IN AEPB: 1.0000 | INHALATION_SPRAY | Freq: Two times a day (BID) | RESPIRATORY_TRACT | 5 refills | Status: DC

## 2018-09-08 MED ORDER — PROAIR HFA 108 (90 BASE) MCG/ACT IN AERS: 1.2000 | INHALATION_SPRAY | Freq: Four times a day (QID) | RESPIRATORY_TRACT | 5 refills | Status: DC | PRN

## 2018-09-08 MED ORDER — GUAIFENESIN-CODEINE 100-10 MG/5ML PO SOLN: 5.0000 mL | ORAL | 0 refills | Status: DC | PRN

## 2018-09-08 MED ORDER — PREDNISONE 5 MG PO TABS: ORAL_TABLET | ORAL | 1 refills | Status: DC

## 2018-09-08 NOTE — Patient Instructions (Signed)
Continue prednisone as prescribed  ID consultation  Continue inhalers as prescribed

## 2018-09-08 NOTE — Progress Notes (Signed)
[facility] Pulmonary Medicine Consultation      MRN# [id number] Jennifer [name] [birth year] Brief History: Synopsis: 48 year old female with left upper lobe cavitary lesion, history of bilateral pneumonia, currently being followed by pulmonary and infectious disease. Cavitary lesion differential at this time includes fungal infection versus sarcoidosis. 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   CC: Follow up SOB    Events since last clinic visit:  HPI Patient with chronic shortness of breath history of Has a clinical diagnosis of sarcoidosis  No fevers no chills at this time  On chronic prednisone therapy 5 mg daily   On chronic voriconazole as per ID No signs of active infection at this time  Previous 6-minute walk and ONO within normal limits  Has intermittent fatigue at times She does not want to taper steroids at this time Robitussin with codeine helps her cough     Medication:    Current Outpatient Medications:  .  acetaminophen (TYLENOL) 500 MG tablet, Take 500 mg by mouth every 6 (six) hours as needed., Disp: , Rfl:  .  ADVAIR DISKUS 250-50 MCG/DOSE AEPB, Inhale 1 puff into the lungs as directed. (Patient taking differently: Inhale 1 puff into the lungs 2 (two) times daily. ), Disp: 60 each, Rfl: 2 .  albuterol (ACCUNEB) 1.25 MG/3ML nebulizer solution, Take 1 ampule by nebulization every 6 (six) hours as needed for wheezing., Disp: , Rfl:  .  Azelastine-Fluticasone 137-50 MCG/ACT SUSP, Place 1 spray into the nose 2 (two) times daily., Disp: 1 Bottle, Rfl: 0 .  cetirizine (ZYRTEC) 10 MG tablet, Take 1 tablet (10 mg total) by mouth daily., Disp: 30 tablet, Rfl: 3 .  diclofenac (VOLTAREN) 75 MG EC tablet, Take 1 tablet (75 mg total) by mouth 2 (two) times daily., Disp: 30 tablet, Rfl: 0 .  fluticasone (FLONASE) 50 MCG/ACT nasal spray, Place 2 sprays into both nostrils daily., Disp: , Rfl:  0 .  glycerin adult 2 g suppository, Place 1 suppository rectally as needed for constipation., Disp: , Rfl:  .  ibuprofen (ADVIL,MOTRIN) 400 MG tablet, Take 400 mg by mouth every 6 (six) hours as needed., Disp: , Rfl:  .  lactulose (CHRONULAC) 10 GM/15ML solution, Take 10 g by mouth 2 (two) times daily., Disp: , Rfl:  .  metoprolol tartrate (LOPRESSOR) 100 MG tablet, Take 1 tablet (100 mg total) by mouth 2 (two) times daily., Disp: 180 tablet, Rfl: 1 .  omeprazole (PRILOSEC) 40 MG capsule, Take 1 capsule (40 mg total) by mouth daily., Disp: 90 capsule, Rfl: 3 .  ondansetron (ZOFRAN) 4 MG tablet, Take 4 mg by mouth every 6 (six) hours as needed., Disp: , Rfl: 0 .  polyethylene glycol powder (GLYCOLAX/MIRALAX) powder, Take 17 g by mouth as needed., Disp: , Rfl: 0 .  predniSONE (DELTASONE) 5 MG tablet, take 2 tablets by mouth once daily with BREAKFAST, Disp: 60 tablet, Rfl: 1 .  PROAIR HFA 108 (90 BASE) MCG/ACT inhaler, Inhale 108 mcg into the lungs every 6 (six) hours as needed., Disp: , Rfl:  .  Probiotic Product (CULTURELLE IMMUNE DEFENSE PO), Take 1 capsule by mouth daily., Disp: , Rfl:  .  pseudoephedrine (SUDAFED) 60 MG tablet, Take 1 tablet (60 mg total) by mouth every 4 (four) hours as needed for congestion., Disp: 24 tablet, Rfl: 0 .  ranitidine (ZANTAC) 75 MG tablet, Take 75 mg by mouth 2 (two) times daily., Disp: , Rfl:  .  SAPHRIS 5 MG SUBL 24 hr tablet, Take 5 mg by mouth daily., Disp: , Rfl: 0 .  Vitamin D, Cholecalciferol, 1000 units TABS, Take 500 Units by mouth daily. , Disp: , Rfl:  .  voriconazole (VFEND) 200 MG tablet, Take 200 mg by mouth 2 (two) times daily., Disp: , Rfl:     Review of Systems  Constitutional: Negative for chills, fever and weight loss.  HENT: Negative for ear discharge, ear pain, hearing loss, nosebleeds and tinnitus.   Eyes: Negative for blurred vision and pain.  Respiratory: Negative for cough, sputum production and shortness of breath.   Cardiovascular:  Negative for chest pain and palpitations.  Gastrointestinal: Negative for heartburn, nausea and vomiting.  Skin: Negative for rash.  Neurological: Negative for dizziness and headaches.  Endo/Heme/Allergies: Positive for environmental allergies. Does not bruise/bleed easily.    Review of Systems:  Gen:  Denies  fever, sweats, chills weigh loss  HEENT: Denies blurred vision, double vision, ear pain, eye pain, hearing loss, nose bleeds, sore throat Cardiac:  No dizziness, chest pain or heaviness, chest tightness,edema, No JVD Resp:   No cough, -sputum production, -shortness of breath,-wheezing, -hemoptysis,  Gi: Denies swallowing difficulty, stomach pain, nausea or vomiting, diarrhea, constipation, bowel incontinence Gu:  Denies bladder incontinence, burning urine Ext:   Denies Joint pain, stiffness or swelling Skin: Denies  skin rash, easy bruising or bleeding or hives Endoc:  Denies polyuria, polydipsia , polyphagia or weight change Psych:   Denies depression, insomnia or hallucinations  Other:  All other systems negative     Allergies:  Penicillin g  Physical Examination:  BP 112/80 (BP Location: Left Arm, Cuff Size: Normal)   Pulse (!) 113   Resp 16   Ht 4\' 9"  (1.448 m)   Wt 118 lb (53.5 kg)   SpO2 100%   BMI 25.53 kg/m   BP 112/80 (BP Location: Left Arm, Cuff Size: Normal)   Pulse (!) 113   Resp 16   Ht 4\' 9"  (1.448 m)   Wt 118 lb (53.5 kg)   SpO2 100%   BMI 25.53 kg/m  Physical Examination:   GENERAL:NAD, no fevers, chills, no weakness no fatigue HEAD: Normocephalic, atraumatic.  EYES: Pupils equal, round, reactive to light. Extraocular muscles intact. No scleral icterus.  MOUTH: Moist mucosal membrane. Dentition intact. No abscess noted.  EAR, NOSE, THROAT: Clear without exudates. No external lesions.  NECK: Supple. No thyromegaly. No nodules. No JVD.  PULMONARY: CTA B/L no wheezing, rhonchi, crackles CARDIOVASCULAR: S1 and S2. Regular rate and rhythm. No  murmurs, rubs, or gallops. No edema. Pedal pulses 2+ bilaterally.  GASTROINTESTINAL: Soft, nontender, nondistended. No masses. Positive bowel sounds. No hepatosplenomegaly.  MUSCULOSKELETAL: No swelling, clubbing, or edema. Range of motion full in all extremities.  NEUROLOGIC: Cranial nerves II through XII are intact. No gross focal neurological deficits. Sensation intact. Reflexes intact.  SKIN: No ulceration, lesions, rashes, or cyanosis. Skin warm and dry. Turgor intact.  PSYCHIATRIC: Mood, affect within normal limits. The patient is awake, alert and oriented x 3. Insight, judgment intact.  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 therapy early in 2016 but developed a fungus  ball on CT chest Seen by [facility] pulmonary advised to continue with sarcoid treatment and restart voriconazole Was told she needs voriconazole indefinitely Needs infectious disease consultation referral  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 Patient with complex case of cavitary lung lesion however given her clinical signs and symptoms with the working diagnosis at this time her cavitary lung lesion is secondary to fungal infection with underlying sarcoidosis Patient would like to continue chronic prednisone therapy 5 mg daily She does not want to decrease the dose She feels that the current dose of prednisone is stabilizing her respiratory symptoms I have explained the risks of chronic prednisone therapy and she understands   Persistent cough nonproductive Robitussin with codeine as needed   Patient satisfied with Plan of action and management. All questions answered Follow-up in  6 months  Jennifer [name] Jennifer [name], M.D.  [facility]  Medical Director [facility] Medical Director [facility] Cardio-Pulmonary Department

## 2018-09-15 ENCOUNTER — Encounter: Payer: Self-pay | Admitting: Infectious Diseases

## 2018-09-15 ENCOUNTER — Other Ambulatory Visit
Admission: RE | Admit: 2018-09-15 | Discharge: 2018-09-15 | Disposition: A | Discharge status: Home / Self Care | Payer: Medicaid Other | Attending: Infectious Diseases | Admitting: Infectious Diseases

## 2018-09-15 ENCOUNTER — Ambulatory Visit: Payer: Medicaid Other | Admitting: Infectious Diseases

## 2018-09-15 ENCOUNTER — Ambulatory Visit: Payer: Medicaid Other | Attending: Infectious Diseases | Admitting: Infectious Diseases

## 2018-09-15 VITALS — BP 125/72 | HR 95 | Temp 97.5°F | Ht <= 58 in | Wt 119.0 lb

## 2018-09-15 DIAGNOSIS — Z7952 Long term (current) use of systemic steroids: Secondary | ICD-10-CM

## 2018-09-15 DIAGNOSIS — Z880 Allergy status to penicillin: Secondary | ICD-10-CM | POA: Diagnosis not present

## 2018-09-15 DIAGNOSIS — Z8619 Personal history of other infectious and parasitic diseases: Secondary | ICD-10-CM

## 2018-09-15 DIAGNOSIS — B449 Aspergillosis, unspecified: Secondary | ICD-10-CM | POA: Insufficient documentation

## 2018-09-15 DIAGNOSIS — D860 Sarcoidosis of lung: Secondary | ICD-10-CM | POA: Diagnosis not present

## 2018-09-15 NOTE — Patient Instructions (Signed)
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 You had been on voriconalzole but you had not taken it since June 2018. Today will do some labs and if they are okay you dont need any further treatment. I have discussed with Dr.Kasa

## 2018-09-15 NOTE — Progress Notes (Signed)
NAME: Jennifer PulseRenee B Rookstool  DOB: 04/02/1971  MRN: [id number]  Date/Time: 09/15/2018 11:21 AM Subjective:  REASON FOR CONSULT: aspergillus ? Jennifer PulseRenee B Jennifer [name] is a 48 y.o. female with a history of sarcoidosis/aspergilloma is here to to see me Jennifer [name] previous ID at [facility] is no longer in town. 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 Had a repeat Bronch with biopsy in May 2017 and it was neg for aspergillus. Jennifer [name] was followed by Dr.Fitzgerald and his note reflects that Jennifer [name] is to continue  Voriconazole but patient said Jennifer [name] stopped it a few months ago and  when I [name] the last time Jennifer [name] got the medicine was on 02/09/17 . Jennifer [name] is on prednisone 15mg  and is follwoed by Dr.Kasa- Jennifer [name] saw him recently and he asked to follow up with me as Dr.Fitzgerald is [place] the [place]   On reviewing Records from [facility] this is as follows  Medical History taken from Dr.Fitzgerald's notes April 2016 " admitted November 03 2014 with fevers, cough for several days, as well as chills. Jennifer [name] reports that Jennifer [name] has had a chronic cough for approximately 8 years. Jennifer [name] has had 1 other ER visit, Jennifer [name] reports for pneumonia about 2 years ago. Jennifer [name] thinks Jennifer [name] has asthma, but is not followed closely for this. For this admission, Jennifer [name] developed severe chills over the last several days and Jennifer [name] cough became worse. When Jennifer [name] was admitted, Jennifer [name] had an abnormal chest x-ray and was tachycardic to 120. Jennifer [name] was treated with levofloxacin as well and IV fluids. CT scan was done and it shows marked abnormalities with cavitary lesions and other findings.  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 Jennifer [name] lives with Jennifer [name] boyfriend, who is chronically ill as well, and has had [facility]. Jennifer [name]  reports that he has mold in his house. Jennifer [name] is lifelong nonsmoker and denies other substance abuse. Jennifer [name] has 2 grown children who live in [place] and 1 is in the [employer]. Jennifer [name] denies any known TB exposure. Jennifer [name] reports having a TB test several years ago, which was negative. Prior to this acute illness over the last several days, Jennifer [name] denied any night sweats, recurrent fevers, or weight loss.   During admission patient had BAL with neg afb. Had bxp of LN in axillay consistent with saroidosis. Jennifer [name] had fungal growth on BAL consistent with asapergillus. Started on voriconazole and discharged on 3/8 on oral voriconazole. 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 Has chronic cough but no hemoptysis. Started vori again 08/2016 and much better in terms of cough. Wt stable Remains on prednisone 15 mg BID Will check labs and continue current vori and steroids  Sarcoidosis- Continue steroids for the sarcoid under care of Dr Belia HemanKasa- on 15 mg prednisone BID now but considering alternative meds Also [facility] but not on list.  We can consider PCP ppx with bactrim since Jennifer [name] is on pred > 20 mg a day but Jennifer [name] is not on any other immunosuppressives and most experts rec it only if also on another immunosuppressive. Jennifer [name] will continue calcium  with Vit D under care of Jennifer [name] PCP  Hx Pseudomonas on sputum cx from previous BAL Now cough is dry - had good response to oral cipro x 21 days If cough recurs with sputum will repeat cx as at risk of recurrent infection    Past Medical History:  Diagnosis Date  . Anxiety   . Asthma   . Depression   . Fibromyalgia   . Incontinence   . Lymphadenopathy   . Pneumonia    bilateral community acquired   . PVC's (premature ventricular contractions)   . Sarcoidosis   . Sepsis (HCC)   . Shortness of breath dyspnea   . Tachycardia     Past Surgical  History:  Procedure Laterality Date  . CESAREAN SECTION     1992/1994  . ENDOBRONCHIAL ULTRASOUND N/A 01/27/2016   Procedure: ENDOBRONCHIAL ULTRASOUND;  Surgeon: Stephanie Acre, MD;  Location: [facility] ORS;  Service: Cardiopulmonary;  Laterality: N/A;  . LYMPH NODE BIOPSY    . VIDEO BRONCHOSCOPY N/A 01/27/2016   Procedure: VIDEO BRONCHOSCOPY WITH FLUORO;  Surgeon: Stephanie Acre, MD;  Location: [facility] ORS;  Service: Cardiopulmonary;  Laterality: N/A;    SH Lives on Jennifer [name] own Works at [employer] Non smoker No alcohol   Family History  Problem Relation Age of Onset  . Lung cancer Mother    Allergies  Allergen Reactions  . Penicillin G Hives   ? Current Outpatient Medications  Medication Sig Dispense Refill  . acetaminophen (TYLENOL) 500 MG tablet Take 500 mg by mouth every 6 (six) hours as needed.    Marland Kitchen ADVAIR DISKUS 250-50 MCG/DOSE AEPB Inhale 1 puff into the lungs 2 (two) times daily. 60 each 5  . albuterol (ACCUNEB) 1.25 MG/3ML nebulizer solution Take 1 ampule by nebulization every 6 (six) hours as needed for wheezing.    . Azelastine-Fluticasone 137-50 MCG/ACT SUSP Place 1 spray into the nose 2 (two) times daily. 1 Bottle 0  . cetirizine (ZYRTEC) 10 MG tablet Take 1 tablet (10 mg total) by mouth daily. 30 tablet 3  . diclofenac (VOLTAREN) 75 MG EC tablet Take 1 tablet (75 mg total) by mouth 2 (two) times daily. 30 tablet 0  . fluticasone (FLONASE) 50 MCG/ACT nasal spray Place 2 sprays into both nostrils daily.  0  . glycerin adult 2 g suppository Place 1 suppository rectally as needed for constipation.    Marland Kitchen guaiFENesin-codeine 100-10 MG/5ML syrup Take 5 mLs by mouth every 4 (four) hours as needed for cough. 120 mL 0  . ibuprofen (ADVIL,MOTRIN) 400 MG tablet Take 400 mg by mouth every 6 (six) hours as needed.    . lactulose (CHRONULAC) 10 GM/15ML solution Take 10 g by mouth 2 (two) times daily.    . metoprolol tartrate (LOPRESSOR) 100 MG tablet Take 1 tablet (100 mg total) by  mouth 2 (two) times daily. 180 tablet 1  . omeprazole (PRILOSEC) 40 MG capsule Take 1 capsule (40 mg total) by mouth daily. 90 capsule 3  . ondansetron (ZOFRAN) 4 MG tablet Take 4 mg by mouth every 6 (six) hours as needed.  0  . polyethylene glycol powder (GLYCOLAX/MIRALAX) powder Take 17 g by mouth as needed.  0  . predniSONE (DELTASONE) 5 MG tablet take 2 tablets by mouth once daily with BREAKFAST 60 tablet 1  . PROAIR HFA 108 (90 Base) MCG/ACT inhaler Inhale 1 puff into the lungs every 6 (six) hours as needed. 18 g 5  . Probiotic Product (CULTURELLE IMMUNE DEFENSE PO) Take  1 capsule by mouth daily.    . pseudoephedrine (SUDAFED) 60 MG tablet Take 1 tablet (60 mg total) by mouth every 4 (four) hours as needed for congestion. 24 tablet 0  . ranitidine (ZANTAC) 75 MG tablet Take 75 mg by mouth 2 (two) times daily.    Marland Kitchen. SAPHRIS 5 MG SUBL 24 hr tablet Take 5 mg by mouth daily.  0  . Vitamin D, Cholecalciferol, 1000 units TABS Take 500 Units by mouth daily.     Marland Kitchen. voriconazole (VFEND) 200 MG tablet Take 200 mg by mouth 2 (two) times daily.     No current facility-administered medications for this visit.      Abtx:  Anti-infectives (From admission, onward)   None      REVIEW OF SYSTEMS:  Const: negative fever, negative chills, negative weight loss, some night sweats Eyes: negative diplopia or visual changes, negative eye pain ENT: negative coryza, negative sore throat Resp:  Cough, non productive ,no  hemoptysis, dyspnea Cards: negative for chest pain, palpitations, lower extremity edema GU: negative for frequency, dysuria and hematuria GI: Negative for abdominal pain, diarrhea, bleeding, constipation Skin: negative for rash and pruritus Heme: negative for easy bruising and gum/nose bleeding MS: negative for myalgias, arthralgias, back pain and muscle weakness Neurolo:negative for headaches, dizziness, vertigo, memory problems  Psych: negative for feelings of anxiety, depression    Endocrine: negative for thyroid, diabetes issues Allergy/Immunology- negative for any medication or food allergies ? Objective:  VITALS:  BP 125/72 (BP Location: Left Arm, Patient Position: Sitting, Cuff Size: Normal)   [name] 95   Temp (!) 97.5 F (36.4 C) (Oral)   Ht 4\' 9"  (1.448 m)   Wt 119 lb (54 kg)   LMP 08/26/2018 (Within Months)   SpO2 96%   BMI 25.75 kg/m  PHYSICAL EXAM:  General: Alert, cooperative, no distress, appears stated age. Short stature Head: Normocephalic, without obvious abnormality, atraumatic. Eyes: Conjunctivae clear, anicteric sclerae. Pupils are equal ENT Nares normal. No drainage or sinus tenderness. Lips, mucosa, and tongue normal. No Thrush Neck: Supple, symmetrical, no adenopathy, thyroid: non tender no carotid bruit and no JVD. Back: No CVA tenderness. Lungs: b/l air entry Heart: Regular rate and rhythm, no murmur, rub or gallop. Abdomen: Soft, non-tender,not distended. Bowel sounds normal. No masses Extremities: atraumatic, no cyanosis. No edema. No clubbing Skin: No rashes or lesions. Or bruising Lymph: Cervical, supraclavicular normal. Neurologic: Grossly non-focal Pertinent Labs Lab Results CBC    Component Value Date/Time   WBC 4.3 06/16/2018 1118   WBC 5.4 11/20/2017 2210   RBC 5.49 (H) 06/16/2018 1118   RBC 5.41 (H) 11/20/2017 2210   HGB 13.0 06/16/2018 1118   HCT 39.8 06/16/2018 1118   PLT 284 06/16/2018 1118   MCV 73 (L) 06/16/2018 1118   MCV 77 (L) 11/28/2014 0715   MCH 23.7 (L) 06/16/2018 1118   MCH 26.3 11/20/2017 2210   MCHC 32.7 06/16/2018 1118   MCHC 33.6 11/20/2017 2210   RDW 13.3 06/16/2018 1118   RDW 13.4 11/28/2014 0715   LYMPHSABS 0.8 06/16/2018 1118   LYMPHSABS 0.6 (L) 11/28/2014 0715   MONOABS 0.7 11/20/2017 2210   MONOABS 0.7 11/28/2014 0715   EOSABS 0.1 06/16/2018 1118   EOSABS 0.1 11/28/2014 0715   BASOSABS 0.0 06/16/2018 1118   BASOSABS 0.0 11/28/2014 0715    CMP Latest Ref Rng & Units 07/14/2018  06/16/2018 11/20/2017  Glucose 70 - 99 mg/dL 161(W140(H) 960(A160(H) 540(J119(H)  BUN 6 - 20 mg/dL 7 5(L)  10  Creatinine 0.44 - 1.00 mg/dL 7.03 5.00 9.38  Sodium 135 - 145 mmol/L 137 137 137  Potassium 3.5 - 5.1 mmol/L 3.4(L) 3.7 3.9  Chloride 98 - 111 mmol/L 106 101 105  CO2 22 - 32 mmol/L 25 20 23   Calcium 8.9 - 10.3 mg/dL 9.2 9.5 9.1   [id number]-9/67/89 LEFT UPPER LOBE; BRONCHOSCOPY WITH BIOPSY:  - PERIBRONCHIAL TISSUE WITH EPITHELIOID NONCASEATING GRANULOMATOUS  INFLAMMATION, COMPATIBLE WITH PATIENT'S KNOWN HISTORY OF SARCOID.  - POLARIZABLE MATERIAL IS NOT IDENTIFIED.  - AFB AND GMS STAINS ARE NEGATIVE; STAIN CONTROLS WORKED APPROPRIATELY   Microbiology:  11/07/2014 BAL- aspergillus fumigatus 01/27/2016- Bronch Neg for fungus  No results found for this or any previous visit (from the past 240 hour(s)). IMAGING RESULTS: 102/27/16   03/30/16  ? Impression/Recommendation ?48 y.o. female with a history of sarcoidosis/aspergilloma is here to to see me Jennifer [name] previous ID at [facility] is no longer in town. 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 Had a repeat Bronch with biopsy in May 2017 and it was neg for aspergillus. Jennifer [name] was followed by Dr.Fitzgerald and his note reflects that Jennifer [name] is to continue  Voriconazole but patient said Jennifer [name] stopped it a few months ago and  when I [name] the last time Jennifer [name] got the medicine was on 02/09/17 .   Pulmonary sarcoid- cavitary in nature with colonization VS true infecion with aspergillus fumigatus in 2016 and received almost 2 years of voriconazole. The repeat Bronch and biopsy from May 2017 shows no aspergillus- CT scan from March 2017 does not show any aspergilloma. Pt stopped the meds herself many months ago and Jennifer [name] thought it was June  2019 but it was June 2018. Will not restart Voriconazole Will check Galactomannan and Beta d glucan- if either one positive we can repeat CT chest and decide on further management  Sarcoid on prednisone and has inhalers Discussed the management with Dr.Kasa herpulmonologist  ? ? ___________________________________________________ Discussed with patient Follow up PRN

## 2018-09-17 LAB — FUNGITELL, SERUM: Fungitell Result: <31 pg/mL (ref <80)

## 2018-09-19 LAB — ASPERGILLUS ANTIGEN, BAL/SERUM: ASPERGILLUS AG, BAL/SERUM: 0.06 {index} (ref 0.00–0.49)

## 2018-11-09 ENCOUNTER — Other Ambulatory Visit: Payer: Self-pay | Admitting: Internal Medicine

## 2018-11-09 DIAGNOSIS — E0590 Thyrotoxicosis, unspecified without thyrotoxic crisis or storm: Secondary | ICD-10-CM

## 2018-11-24 ENCOUNTER — Other Ambulatory Visit: Payer: Medicaid Other

## 2018-11-25 ENCOUNTER — Other Ambulatory Visit: Payer: Medicaid Other

## 2018-11-28 ENCOUNTER — Other Ambulatory Visit: Payer: Medicaid Other

## 2018-11-29 ENCOUNTER — Other Ambulatory Visit: Payer: Medicaid Other

## 2018-12-08 ENCOUNTER — Telehealth: Payer: Self-pay | Admitting: Internal Medicine

## 2018-12-08 NOTE — Telephone Encounter (Signed)
Made in error

## 2019-01-10 ENCOUNTER — Other Ambulatory Visit: Payer: Self-pay | Admitting: Internal Medicine

## 2019-01-10 DIAGNOSIS — D869 Sarcoidosis, unspecified: Secondary | ICD-10-CM

## 2019-06-14 ENCOUNTER — Other Ambulatory Visit: Payer: Self-pay

## 2019-06-14 ENCOUNTER — Encounter: Payer: Self-pay | Admitting: Internal Medicine

## 2019-06-14 ENCOUNTER — Ambulatory Visit (INDEPENDENT_AMBULATORY_CARE_PROVIDER_SITE_OTHER): Payer: Medicaid Other | Admitting: Internal Medicine

## 2019-06-14 VITALS — BP 100/70 | HR 86 | Temp 97.5°F | Ht <= 58 in | Wt 116.2 lb

## 2019-06-14 DIAGNOSIS — I493 Ventricular premature depolarization: Secondary | ICD-10-CM | POA: Diagnosis not present

## 2019-06-14 DIAGNOSIS — R0602 Shortness of breath: Secondary | ICD-10-CM | POA: Diagnosis not present

## 2019-06-14 NOTE — Progress Notes (Signed)
Follow-up Outpatient Visit Date: 06/14/2019  Primary Care Provider: [facility], [place] [address]  Chief Complaint: Follow-up PVCs and shortness of breath  HPI:  Ms. Hoadley is a 48 y.o. year-old female with history of frequent PVCs, sarcoidosis, fibromyalgia, asthma, and anxiety, who presents for follow-up of PVCs and shortness of breath.  I last saw her a year ago, at which time she reported increase in her cough as well as borderline fevers.  She endorsed chest pain with coughing but not with other activity.  Chronic shortness of breath and 3 pillow orthopnea were unchanged.  We discussed performing a cardiac CTA for evaluation of her chest pain and history of underlying lung disease.  However, cardiac CTA had to be canceled due to excessive PVCs and elevated heart rate.  Subsequent myocardial perfusion stress test was low risk without ischemia or scar.  Today, Ms. Pavlock reports that she feels about the same as at our last visit.  She has stable exertional dyspnea and dependent edema.  She is not have any palpitations but notes sporadic chest pain lasting 1 to 2 seconds.  She describes it as a pulling sensation in the center of her chest.  There are no associated symptoms.  The pain is not exertional.  She notes that she has lost some weight over the last few months, as she is very careful about what she eats.  She does not wish to gain weight in the setting of chronic steroid use.  She has stable 3 pillow orthopnea.  --------------------------------------------------------------------------------------------------  Cardiovascular History & Procedures: Cardiovascular Problems:  Frequent PVCs  Atypical chest pain and shortness of breath (history of pulmonary sarcoidosis)  Risk Factors:  None  Cath/PCI:  None  CV Surgery:  None  EP Procedures and Devices:  24-hour Holter monitor (03/26/17): Predominantly sinus rhythm with frequent  PVCs (6% burden; previously 13%). No VT or other sustained arrhythmia.  24-hour Holter monitor (06/29/16): Predominantly sinus rhythm with frequent PVCs and rare PACs. PVC burden was 13%.  Non-Invasive Evaluation(s):  Pharmacologic MPI (08/25/2018): Low risk study without ischemia or scar.  LVEF 62%.  Significant GI uptake noted.  TTE (09/20/2017): Normal LV size and wall thickness. LVEF 55 to 60% with normal wall motion and diastolic function. Normal RV size and function. Normal PA pressure.  TTE (06/29/16): Normal LV size with LVEF of 50-55%. Normal wall motion and diastolic function. Mild MR. Normal RV size and function. Normal pulmonary artery pressure.  Recent CV Pertinent Labs: Lab Results  Component Value Date   K 3.4 (L) 07/14/2018   K 4.0 11/28/2014   BUN 7 07/14/2018   BUN 5 (L) 06/16/2018   BUN < 5 (L) 11/28/2014   CREATININE 0.46 07/14/2018   CREATININE 0.67 11/28/2014    Past medical and surgical history were reviewed and updated in EPIC.  No outpatient medications have been marked as taking for the 06/14/19 encounter (Appointment) with Kenslie Abbruzzese, Harrell Gave, MD.    Allergies: Penicillin g  Social History   Tobacco Use  . Smoking status: Never Smoker  . Smokeless tobacco: Never Used  Substance Use Topics  . Alcohol use: No    Alcohol/week: 0.0 standard drinks  . Drug use: No    Family History  Problem Relation Age of Onset  . Lung cancer Mother     Review of Systems: A 12-system review of systems was performed and was negative except as noted in the HPI.  --------------------------------------------------------------------------------------------------  Physical Exam:  There were no vitals taken for this visit.  General: NAD. HEENT: No conjunctival pallor or scleral icterus.  Facemask in place. Neck: Supple without lymphadenopathy, thyromegaly, JVD, or HJR. Lungs: Normal work of breathing.  Coarse breath sounds bilaterally.  No wheezes or crackles.  Heart: Regular rate and rhythm without murmurs, rubs, or gallops. Non-displaced PMI. Abd: Bowel sounds present. Soft, NT/ND without hepatosplenomegaly Ext: No lower extremity edema. Skin: Warm and dry without rash.  EKG: Normal sinus rhythm with frequent PVCs and left atrial enlargement.  Lab Results  Component Value Date   WBC 4.3 06/16/2018   HGB 13.0 06/16/2018   HCT 39.8 06/16/2018   MCV 73 (L) 06/16/2018   PLT 284 06/16/2018    Lab Results  Component Value Date   NA 137 07/14/2018   K 3.4 (L) 07/14/2018   CL 106 07/14/2018   CO2 25 07/14/2018   BUN 7 07/14/2018   CREATININE 0.46 07/14/2018   GLUCOSE 140 (H) 07/14/2018    No results found for: CHOL, HDL, LDLCALC, LDLDIRECT, TRIG, CHOLHDL  --------------------------------------------------------------------------------------------------  ASSESSMENT AND PLAN: PVCs: Longstanding and improved with metoprolol.  Sinus bradycardia precludes escalation at this time.  I wonder if some of her transient chest discomfort may be related to the PVCs.  Given low risk myocardial perfusion stress test last December, we have agreed to defer additional testing at this time.  If symptoms worsen or PVC burden increases, cardiac MRI may be needed to assess for infiltrative cardiomyopathy in the setting of sarcoidosis.  Hyperthyroidism may be contributing to PVCs as well.  Ms. Fifer should continue to follow with Dr. Tedd Sias for management of this.  Shortness of breath: Likely multifactorial but driven predominantly by sarcoidosis.  Echocardiogram in 09/2017 was normal.  No further cardiac work-up/intervention at this time.  Ms. Nester should continue to follow closely with pulmonary.  Follow-up: Return to clinic in 6 months.  Yvonne Kendall, MD 06/14/2019 7:11 AM

## 2019-06-14 NOTE — Patient Instructions (Signed)
Medication Instructions:  Your physician recommends that you continue on your current medications as directed. Please refer to the Current Medication list given to you today.  If you need a refill on your cardiac medications before your next appointment, please call your pharmacy.   Lab work: NONE If you have labs (blood work) drawn today and your tests are completely normal, you will receive your results only by: . MyChart Message (if you have MyChart) OR . A paper copy in the mail If you have any lab test that is abnormal or we need to change your treatment, we will call you to review the results.  Testing/Procedures: NONE  Follow-Up: At [facility], you and your health needs are our priority.  As part of our continuing mission to provide you with exceptional heart care, we have created designated Provider Care Teams.  These Care Teams include your primary Cardiologist (physician) and Advanced Practice Providers (APPs -  Physician Assistants and Nurse Practitioners) who all work together to provide you with the care you need, when you need it. You will need a follow up appointment in 6 months.   Please call our office 2 months in advance to schedule this appointment.  You may see DR CHRISTOPHER END or one of the following Advanced Practice Providers on your designated Care Team:   Christopher Berge, NP Ryan Dunn, PA-C . Jacquelyn Visser, PA-C    

## 2019-06-15 ENCOUNTER — Encounter: Payer: Self-pay | Admitting: Internal Medicine

## 2019-11-27 ENCOUNTER — Ambulatory Visit: Payer: Medicaid Other | Attending: Internal Medicine

## 2019-11-27 DIAGNOSIS — Z23 Encounter for immunization: Secondary | ICD-10-CM

## 2019-11-27 NOTE — Progress Notes (Signed)
   Covid-19 Vaccination Clinic  Name:  DAJANAE BROPHY    MRN: [id number] DOB: Sep 25, 1970  11/27/2019  Ms. Macdowell was observed post Covid-19 immunization for 30 minutes based on pre-vaccination screening without incident. She was provided with Vaccine Information Sheet and instruction to access the V-Safe system.   Ms. Laske was instructed to call 911 with any severe reactions post vaccine: Marland Kitchen Difficulty breathing  . Swelling of face and throat  . A fast heartbeat  . A bad rash all over body  . Dizziness and weakness   Immunizations Administered    Name Date Dose VIS Date Route   Pfizer COVID-19 Vaccine 11/27/2019  9:48 AM 0.3 mL 08/18/2019 Intramuscular   Manufacturer: [employer], [employer]   Lot: [id number]   NDC: [address]-4

## 2019-12-15 ENCOUNTER — Encounter: Payer: Self-pay | Admitting: Nurse Practitioner

## 2019-12-15 ENCOUNTER — Ambulatory Visit (INDEPENDENT_AMBULATORY_CARE_PROVIDER_SITE_OTHER): Payer: Medicaid Other | Admitting: Nurse Practitioner

## 2019-12-15 ENCOUNTER — Other Ambulatory Visit: Payer: Self-pay

## 2019-12-15 VITALS — BP 118/62 | HR 104 | Ht <= 58 in | Wt 112.5 lb

## 2019-12-15 DIAGNOSIS — I493 Ventricular premature depolarization: Secondary | ICD-10-CM

## 2019-12-15 NOTE — Progress Notes (Signed)
Office Visit    Patient Name: Jennifer [name] Date of Encounter: 12/15/2019  Primary Care Provider:  Herminio Commons, MD Primary Cardiologist:  Nelva Bush, MD  Chief Complaint    49 year old female with a history of frequent PVCs, sarcoidosis, fibromyalgia, hypothyroidism, asthma, and anxiety, who presents for follow-up of PVCs and shortness of breath.  Past Medical History    Past Medical History:  Diagnosis Date  . Anxiety   . Asthma   . Chest pain    a. 08/2018 MV: EF 62%, no ischemia/scar.  . Depression   . Fibromyalgia   . History of echocardiogram    a. 09/2017 Echo: Nl LV size/fxn/wall thickness.  . Hyperthyroidism   . Incontinence   . Lymphadenopathy   . Pneumonia    bilateral community acquired   . PVC's (premature ventricular contractions)   . PVC's (premature ventricular contractions)    a. 06/2016 24h Holter: Sinus rhythm, freq PVCs (13% burden), rare PACs; b. 03/2017 24h Holter: Predominantly sinus rhythm. Freq PVCs (6% burden).  . Sarcoidosis   . Sepsis ([place])   . Shortness of breath dyspnea   . Tachycardia    Past Surgical History:  Procedure Laterality Date  . CESAREAN SECTION     1992/1994  . ENDOBRONCHIAL ULTRASOUND N/A 01/27/2016   Procedure: ENDOBRONCHIAL ULTRASOUND;  Surgeon: Vilinda Boehringer, MD;  Location: [facility] ORS;  Service: Cardiopulmonary;  Laterality: N/A;  . LYMPH NODE BIOPSY    . VIDEO BRONCHOSCOPY N/A 01/27/2016   Procedure: VIDEO BRONCHOSCOPY WITH FLUORO;  Surgeon: Vilinda Boehringer, MD;  Location: [facility];  Service: Cardiopulmonary;  Laterality: N/A;    Allergies  Allergies  Allergen Reactions  . Penicillin G Hives    History of Present Illness    49 year old female with above past medical history including frequent PVCs, sarcoidosis, fibromyalgia, hypothyroidism, asthma, and anxiety.  She was previously evaluated by Dr. Saunders Revel for chest pain, dyspnea, and chronic 3 pillow orthopnea.  She was originally scheduled for cardiac CTA  but this was canceled due to frequent PVCs and elevated heart rate.  She subsequently underwent stress testing which was low risk without ischemia or scar.  She was last seen in clinic in October 2020, at which time she reported occasional episodes of fleeting chest pain.  Since then, she has not experienced much in the way of chest discomfort.  She has had some pleuritic chest pain in the setting of coughing recently, which she thinks was exacerbated by allergies.  She occasionally notes palpitations described as skipped beats though this is relatively rare and seems to be well controlled on beta-blocker therapy.  She notes that in the setting of hyperthyroidism, she is always hot and actually wears a small battery-operated fan around her neck.  She has some degree of chronic, stable dyspnea on exertion and denies PND, orthopnea, dizziness, syncope, edema, or early satiety.  Home Medications    Prior to Admission medications   Medication Sig Start Date End Date Taking? Authorizing Provider  acetaminophen (TYLENOL) 500 MG tablet Take 500 mg by mouth every 6 (six) hours as needed.    [provider]  ADVAIR DISKUS 250-50 MCG/DOSE AEPB Inhale 1 puff into the lungs 2 (two) times daily. 09/08/18   Flora Lipps, MD  albuterol (ACCUNEB) 1.25 MG/3ML nebulizer solution Take 1 ampule by nebulization every 6 (six) hours as needed for wheezing.    [provider]  Azelastine-Fluticasone 137-50 MCG/ACT SUSP Place 1 spray into the nose 2 (two) times  daily. 01/19/17   Erin Fulling, MD  cetirizine (ZYRTEC) 10 MG tablet Take 1 tablet (10 mg total) by mouth daily. 01/19/17   Erin Fulling, MD  diclofenac (VOLTAREN) 75 MG EC tablet Take 1 tablet (75 mg total) by mouth 2 (two) times daily. 09/26/15   Vivi Barrack, DPM  fluticasone (FLONASE) 50 MCG/ACT nasal spray Place 2 sprays into both nostrils daily. 12/04/14   [provider]  glycerin adult 2 g suppository Place 1 suppository rectally as  needed for constipation.    [provider]  guaiFENesin-codeine 100-10 MG/5ML syrup Take 5 mLs by mouth every 4 (four) hours as needed for cough. 09/08/18   Erin Fulling, MD  ibuprofen (ADVIL,MOTRIN) 400 MG tablet Take 400 mg by mouth every 6 (six) hours as needed.    [provider]  lactulose (CHRONULAC) 10 GM/15ML solution Take 10 g by mouth 2 (two) times daily.    [provider]  methimazole (TAPAZOLE) 10 MG tablet Take 30 mg by mouth daily.  05/29/19   [provider]  metoprolol tartrate (LOPRESSOR) 100 MG tablet Take 1 tablet (100 mg total) by mouth 2 (two) times daily. 03/09/18   End, Cristal Deer, MD  omeprazole (PRILOSEC) 40 MG capsule Take 1 capsule (40 mg total) by mouth daily. 04/06/18   Wyline Mood, MD  ondansetron (ZOFRAN) 4 MG tablet Take 4 mg by mouth every 6 (six) hours as needed. 12/04/14   [provider]  polyethylene glycol powder (GLYCOLAX/MIRALAX) powder Take 17 g by mouth as needed. 12/04/14   [provider]  predniSONE (DELTASONE) 5 MG tablet TAKE 2 TABLETS BY MOUTH EVERY DAY WITH BREAKFAST 01/10/19   Erin Fulling, MD  PROAIR HFA [phone] Base) MCG/ACT inhaler Inhale 1 puff into the lungs every 6 (six) hours as needed. 09/08/18   Erin Fulling, MD  Probiotic Product (CULTURELLE IMMUNE DEFENSE PO) Take 1 capsule by mouth daily.    [provider]  ranitidine (ZANTAC) 75 MG tablet Take 75 mg by mouth 2 (two) times daily.    [provider]  SAPHRIS 5 MG SUBL 24 hr tablet Take 5 mg by mouth daily. 05/14/17   [provider]  Vitamin D, Cholecalciferol, 1000 units TABS Take 500 Units by mouth daily.     [provider]    Review of Systems    Chronic, stable dyspnea on exertion, occasional/rare skipped beats/palpitations.  In the setting of hypothyroidism, she always feels hot.  She denies PND, orthopnea, dizziness, syncope, edema, or early satiety.  All other systems reviewed and are otherwise  negative except as noted above.  Physical Exam    VS:  BP 118/62 (BP Location: Left Arm, Patient Position: Sitting, Cuff Size: Normal)   Pulse (!) 104   Ht 4\' 9"  (1.448 m)   Wt 112 lb 8 oz (51 kg)   SpO2 98%   BMI 24.34 kg/m  , BMI Body mass index is 24.34 kg/m. GEN: Well nourished, well developed, in no acute distress. HEENT: normal. Neck: Supple, no JVD, carotid bruits.  Goiter noted. Cardiac: RRR, tachycardic, no murmurs, rubs, or gallops. No clubbing, cyanosis, edema.  Radials/PT 2+ and equal bilaterally.  Respiratory:  Respirations regular and unlabored, clear to auscultation bilaterally. GI: Soft, nontender, nondistended, BS + x 4. MS: no deformity or atrophy. Skin: warm and dry, no rash. Neuro:  Strength and sensation are intact. Psych: Normal affect.  Accessory Clinical Findings    ECG personally reviewed by me today -sinus  tachycardia, 104, PVCs, left atrial enlargement ST depression with T wave inversion in V1 and V2 similar to July 2019 ECG- no acute changes.  Lab Results  Component Value Date   WBC 4.3 06/16/2018   HGB 13.0 06/16/2018   HCT 39.8 06/16/2018   MCV 73 (L) 06/16/2018   PLT 284 06/16/2018   Lab Results  Component Value Date   CREATININE 0.46 07/14/2018   BUN 7 07/14/2018   NA 137 07/14/2018   K 3.4 (L) 07/14/2018   CL 106 07/14/2018   CO2 25 07/14/2018   Assessment & Plan    1.  PVCs: Patient has been doing well since her last visit in October 2020.  She notes rare occurrences of skipped beats/palpitations on oral beta-blocker therapy.  PVC burden of 6% by Holter monitoring in July 2018 with normal LV function by echo in January 2019 and nonischemic Myoview in December 2019.  She remains sinus tachycardic at baseline in the setting of hyperthyroidism.  Continue Lopressor 100 mg twice daily.  We will continue to consider cardiac MRI for any increased burden of palpitations/PVCs given history of sarcoidosis.  2.  Hyperthyroidism: Ongoing therapy  with endocrinology.  She remains on methimazole and beta-blocker.  3.  Dyspnea exertion: Chronic and stable.  Prior echo showed normal LV function with nonischemic Myoview in 2019.  Likely predominantly driven by sarcoidosis.  She is followed by pulmonology.  4.  Disposition: Follow-up in 6 months or sooner if necessary.  Nicolasa Ducking, NP 12/15/2019, 12:29 PM

## 2019-12-15 NOTE — Patient Instructions (Signed)
Medication Instructions:  Your physician recommends that you continue on your current medications as directed. Please refer to the Current Medication list given to you today.  *If you need a refill on your cardiac medications before your next appointment, please call your pharmacy*   Lab Work: None ordered If you have labs (blood work) drawn today and your tests are completely normal, you will receive your results only by: . MyChart Message (if you have MyChart) OR . A paper copy in the mail If you have any lab test that is abnormal or we need to change your treatment, we will call you to review the results.   Testing/Procedures: None ordered   Follow-Up: At [facility], you and your health needs are our priority.  As part of our continuing mission to provide you with exceptional heart care, we have created designated Provider Care Teams.  These Care Teams include your primary Cardiologist (physician) and Advanced Practice Providers (APPs -  Physician Assistants and Nurse Practitioners) who all work together to provide you with the care you need, when you need it.  We recommend signing up for the patient portal called "MyChart".  Sign up information is provided on this After Visit Summary.  MyChart is used to connect with patients for Virtual Visits (Telemedicine).  Patients are able to view lab/test results, encounter notes, upcoming appointments, etc.  Non-urgent messages can be sent to your provider as well.   To learn more about what you can do with MyChart, go to [email or web address].    Your next appointment:   6 month(s)  The format for your next appointment:   In Person  Provider:    You may see Christopher End, MD or Christopher Berge, NP 

## 2019-12-18 ENCOUNTER — Ambulatory Visit: Payer: Medicaid Other | Attending: Internal Medicine

## 2019-12-18 DIAGNOSIS — Z23 Encounter for immunization: Secondary | ICD-10-CM

## 2019-12-18 NOTE — Progress Notes (Signed)
   Covid-19 Vaccination Clinic  Name:  ZHANAE PROFFIT    MRN: [id number] DOB: 1970-12-29  12/18/2019  Ms. Crumby was observed post Covid-19 immunization for 30 minutes based on pre-vaccination screening without incident. She was provided with Vaccine Information Sheet and instruction to access the V-Safe system.   Ms. Furuya was instructed to call 911 with any severe reactions post vaccine: Marland Kitchen Difficulty breathing  . Swelling of face and throat  . A fast heartbeat  . A bad rash all over body  . Dizziness and weakness   Immunizations Administered    Name Date Dose VIS Date Route   Pfizer COVID-19 Vaccine 12/18/2019  9:16 AM 0.3 mL 08/18/2019 Intramuscular   Manufacturer: [employer], [employer]   Lot: [id number]   NDC: [address]-2

## 2020-06-10 ENCOUNTER — Ambulatory Visit: Payer: Medicaid Other | Admitting: Internal Medicine

## 2020-06-10 NOTE — Progress Notes (Deleted)
Follow-up Outpatient Visit Date: 06/10/2020  Primary Care Provider: Judeen Hammans, MD [address] [place] [address]  Chief Complaint: ***  HPI:  Jennifer [name] is a 49 y.o. female with history of frequent PVCs, sarcoidosis, fibromyalgia, asthma, and anxiety, who presents for follow-up of PVCs and shortness of breath.  She was last seen in our office in April by Ward Givens, NP.  At that time, she reported rare palpitations.  Sinus tachycardia was noted in the setting of hyperthyroidism.  Metoprolol tartrate 100 mg twice daily was continued.  Cardiac MRI was discussed but deferred.  --------------------------------------------------------------------------------------------------  Cardiovascular History & Procedures: Cardiovascular Problems:  Frequent PVCs  Atypical chest pain and shortness of breath (history of pulmonary sarcoidosis)  Risk Factors:  None  Cath/PCI:  None  CV Surgery:  None  EP Procedures and Devices:  24-hour Holter monitor (03/26/17): Predominantly sinus rhythm with frequent PVCs (6% burden; previously 13%). No VT or other sustained arrhythmia.  24-hour Holter monitor (06/29/16): Predominantly sinus rhythm with frequent PVCs and rare PACs. PVC burden was 13%.  Non-Invasive Evaluation(s):  Pharmacologic MPI (08/25/2018): Low risk study without ischemia or scar.  LVEF 62%.  Significant GI uptake noted.  TTE (09/20/2017): Normal LV size and wall thickness. LVEF 55 to 60% with normal wall motion and diastolic function. Normal RV size and function. Normal PA pressure.  TTE (06/29/16): Normal LV size with LVEF of 50-55%. Normal wall motion and diastolic function. Mild MR. Normal RV size and function. Normal pulmonary artery pressure.  Recent CV Pertinent Labs: Lab Results  Component Value Date   K 3.4 (L) 07/14/2018   K 4.0 11/28/2014   BUN 7 07/14/2018   BUN 5 (L) 06/16/2018   BUN < 5 (L) 11/28/2014   CREATININE 0.46  07/14/2018   CREATININE 0.67 11/28/2014    Past medical and surgical history were reviewed and updated in EPIC.  No outpatient medications have been marked as taking for the 06/10/20 encounter (Appointment) with Liviya Santini, Cristal Deer, MD.    Allergies: Penicillin g  Social History   Tobacco Use  . Smoking status: Never Smoker  . Smokeless tobacco: Never Used  Vaping Use  . Vaping Use: Never used  Substance Use Topics  . Alcohol use: No    Alcohol/week: 0.0 standard drinks  . Drug use: No    Family History  Problem Relation Age of Onset  . Lung cancer Mother     Review of Systems: A 12-system review of systems was performed and was negative except as noted in the HPI.  --------------------------------------------------------------------------------------------------  Physical Exam: There were no vitals taken for this visit.  General:  *** HEENT: No conjunctival pallor or scleral icterus. Facemask in place. Neck: Supple without lymphadenopathy, thyromegaly, JVD, or HJR. Lungs: Normal work of breathing. Clear to auscultation bilaterally without wheezes or crackles. Heart: Regular rate and rhythm without murmurs, rubs, or gallops. Non-displaced PMI. Abd: Bowel sounds present. Soft, NT/ND without hepatosplenomegaly Ext: No lower extremity edema. Radial, PT, and DP pulses are 2+ bilaterally. Skin: Warm and dry without rash.  EKG:  ***  Lab Results  Component Value Date   WBC 4.3 06/16/2018   HGB 13.0 06/16/2018   HCT 39.8 06/16/2018   MCV 73 (L) 06/16/2018   PLT 284 06/16/2018    Lab Results  Component Value Date   NA 137 07/14/2018   K 3.4 (L) 07/14/2018   CL 106 07/14/2018   CO2 25 07/14/2018   BUN 7 07/14/2018  CREATININE 0.46 07/14/2018   GLUCOSE 140 (H) 07/14/2018    No results found for: CHOL, HDL, LDLCALC, LDLDIRECT, TRIG, CHOLHDL  --------------------------------------------------------------------------------------------------  ASSESSMENT AND  PLAN: ***  Yvonne Kendall, MD 06/10/2020 7:01 AM

## 2020-07-29 ENCOUNTER — Emergency Department: Payer: Medicaid Other

## 2020-07-29 ENCOUNTER — Emergency Department
Admission: EM | Admit: 2020-07-29 | Discharge: 2020-07-29 | Disposition: A | Discharge status: Home / Self Care | Payer: Medicaid Other | Attending: Emergency Medicine | Admitting: Emergency Medicine

## 2020-07-29 ENCOUNTER — Other Ambulatory Visit: Payer: Self-pay

## 2020-07-29 DIAGNOSIS — R202 Paresthesia of skin: Secondary | ICD-10-CM | POA: Diagnosis not present

## 2020-07-29 DIAGNOSIS — R0602 Shortness of breath: Secondary | ICD-10-CM | POA: Diagnosis present

## 2020-07-29 DIAGNOSIS — Z20822 Contact with and (suspected) exposure to covid-19: Secondary | ICD-10-CM | POA: Diagnosis not present

## 2020-07-29 DIAGNOSIS — E860 Dehydration: Secondary | ICD-10-CM | POA: Diagnosis not present

## 2020-07-29 DIAGNOSIS — J45901 Unspecified asthma with (acute) exacerbation: Secondary | ICD-10-CM | POA: Diagnosis not present

## 2020-07-29 DIAGNOSIS — Z79899 Other long term (current) drug therapy: Secondary | ICD-10-CM | POA: Insufficient documentation

## 2020-07-29 LAB — BLOOD GAS, VENOUS
Acid-Base Excess: 3 mmol/L — ABNORMAL HIGH (ref 0.0–2.0)
Bicarbonate: 27.1 mmol/L (ref 20.0–28.0)
O2 Saturation: 98.2 %
Patient temperature: 37
pCO2, Ven: 39 mmHg — ABNORMAL LOW (ref 44.0–60.0)
pH, Ven: 7.45 — ABNORMAL HIGH (ref 7.250–7.430)
pO2, Ven: 104 mmHg — ABNORMAL HIGH (ref 32.0–45.0)

## 2020-07-29 LAB — CBC
HCT: 42.8 % (ref 36.0–46.0)
Hemoglobin: 13.7 g/dL (ref 12.0–15.0)
MCH: 23.7 pg — ABNORMAL LOW (ref 26.0–34.0)
MCHC: 32 g/dL (ref 30.0–36.0)
MCV: 74 fL — ABNORMAL LOW (ref 80.0–100.0)
Platelets: 223 10*3/uL (ref 150–400)
RBC: 5.78 MIL/uL — ABNORMAL HIGH (ref 3.87–5.11)
RDW: 12.7 % (ref 11.5–15.5)
WBC: 4.2 10*3/uL (ref 4.0–10.5)
nRBC: 0 % (ref 0.0–0.2)

## 2020-07-29 LAB — RESP PANEL BY RT-PCR (FLU A&B, COVID) ARPGX2
Influenza A by PCR: NEGATIVE
Influenza B by PCR: NEGATIVE
SARS Coronavirus 2 by RT PCR: NEGATIVE

## 2020-07-29 LAB — PROCALCITONIN: Procalcitonin: <0.1 ng/mL

## 2020-07-29 LAB — FIBRIN DERIVATIVES D-DIMER (ARMC ONLY): Fibrin derivatives D-dimer (ARMC): 218.36 ng/mL (FEU) (ref 0.00–499.00)

## 2020-07-29 LAB — MAGNESIUM: Magnesium: 2 mg/dL (ref 1.7–2.4)

## 2020-07-29 LAB — SALICYLATE LEVEL: Salicylate Lvl: 9.2 mg/dL (ref 7.0–30.0)

## 2020-07-29 LAB — TROPONIN I (HIGH SENSITIVITY): Troponin I (High Sensitivity): 3 ng/L (ref <18)

## 2020-07-29 LAB — POC URINE PREG, ED: Preg Test, Ur: NEGATIVE

## 2020-07-29 LAB — BASIC METABOLIC PANEL
Anion gap: 13 (ref 5–15)
BUN: 9 mg/dL (ref 6–20)
CO2: 24 mmol/L (ref 22–32)
Calcium: 9.2 mg/dL (ref 8.9–10.3)
Chloride: 101 mmol/L (ref 98–111)
Creatinine, Ser: 0.5 mg/dL (ref 0.44–1.00)
GFR, Estimated: >60 mL/min (ref >60)
Glucose, Bld: 118 mg/dL — ABNORMAL HIGH (ref 70–99)
Potassium: 4.1 mmol/L (ref 3.5–5.1)
Sodium: 138 mmol/L (ref 135–145)

## 2020-07-29 LAB — ACETAMINOPHEN LEVEL: Acetaminophen (Tylenol), Serum: <10 ug/mL — ABNORMAL LOW (ref 10–30)

## 2020-07-29 LAB — TSH: TSH: <0.01 u[IU]/mL — ABNORMAL LOW (ref 0.350–4.500)

## 2020-07-29 MED ORDER — METHYLPREDNISOLONE SODIUM SUCC 125 MG IJ SOLR
125.0000 mg | Freq: Once | INTRAMUSCULAR | Status: AC
  Administered 2020-07-29: 125 mg via INTRAVENOUS
  Filled 2020-07-29: qty 2

## 2020-07-29 MED ORDER — METOPROLOL TARTRATE 50 MG PO TABS
100.0000 mg | ORAL_TABLET | Freq: Once | ORAL | Status: AC
  Administered 2020-07-29: 100 mg via ORAL
  Filled 2020-07-29: qty 2

## 2020-07-29 MED ORDER — IPRATROPIUM-ALBUTEROL 0.5-2.5 (3) MG/3ML IN SOLN
RESPIRATORY_TRACT | Status: AC
  Filled 2020-07-29: qty 6

## 2020-07-29 MED ORDER — LACTATED RINGERS IV BOLUS
1000.0000 mL | Freq: Once | INTRAVENOUS | Status: AC
  Administered 2020-07-29: 1000 mL via INTRAVENOUS

## 2020-07-29 MED ORDER — IPRATROPIUM-ALBUTEROL 0.5-2.5 (3) MG/3ML IN SOLN
9.0000 mL | Freq: Once | RESPIRATORY_TRACT | Status: AC
  Administered 2020-07-29: 9 mL via RESPIRATORY_TRACT
  Filled 2020-07-29: qty 3

## 2020-07-29 MED ORDER — PREDNISONE 10 MG PO TABS: 40.0000 mg | ORAL_TABLET | Freq: Every day | ORAL | 0 refills | Status: DC

## 2020-07-29 MED ORDER — ONDANSETRON HCL 4 MG/2ML IJ SOLN
4.0000 mg | Freq: Once | INTRAMUSCULAR | Status: AC
  Administered 2020-07-29: 4 mg via INTRAVENOUS
  Filled 2020-07-29: qty 2

## 2020-07-29 NOTE — ED Provider Notes (Signed)
[facility] Emergency Department Provider Note  ____________________________________________   First MD Initiated Contact with Patient 07/29/20 [phone]     (approximate)  I have reviewed the triage vital signs and the nursing notes.   HISTORY  Chief Complaint Numbness   HPI Jennifer [name] is a 49 y.o. female with a past medical history of asthma, anxiety, fibromyalgia, hypothyroidism, PVCs, and sarcoidosis who presents for assessment of some pressure in her bilateral forearms associated some shortness of breath, cough, chest pain, and leg numbness vomiting and diarrhea that has been getting worse over the past week.  Patient denies any measured fevers but states she feels warm.  No headache, earache, recent traumatic injuries, rash, or discomfort in her lower extremities.  She denies any urinary symptoms or back pain.  No clear alleviating or aggravating factors.  She denies any tobacco abuse, illicit drug use, or daily EtOH use.  She is not on steroids for her sarcoid diagnosis.         Past Medical History:  Diagnosis Date  . Anxiety   . Asthma   . Chest pain    a. 08/2018 MV: EF 62%, no ischemia/scar.  . Depression   . Fibromyalgia   . History of echocardiogram    a. 09/2017 Echo: Nl LV size/fxn/wall thickness.  . Hyperthyroidism   . Incontinence   . Lymphadenopathy   . Pneumonia    bilateral community acquired   . PVC's (premature ventricular contractions)   . PVC's (premature ventricular contractions)    a. 06/2016 24h Holter: Sinus rhythm, freq PVCs (13% burden), rare PACs; b. 03/2017 24h Holter: Predominantly sinus rhythm. Freq PVCs (6% burden).  . Sarcoidosis   . Sepsis (HCC)   . Shortness of breath dyspnea   . Tachycardia     Patient Active Problem List   Diagnosis Date Noted  . Atypical chest pain 09/15/2017  . PVC's (premature ventricular contractions) 09/15/2017  . 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 Sarcoidosis 03/23/2016  .  Pneumonia due to Pseudomonas (HCC) 02/10/2016  . Pulmonary cavitary lesion   . Hilar adenopathy   . Current use of steroid medication 06/17/2015  . Long term current use of systemic steroids 01/03/2015  . Sinus tachycardia 01/03/2015  . Fungal pneumonia 11/23/2014  . Cough 11/22/2014  . SOB (shortness of breath) 11/22/2014  . Cavitary lesion of lung 11/22/2014    Past Surgical History:  Procedure Laterality Date  . CESAREAN SECTION     1992/1994  . ENDOBRONCHIAL ULTRASOUND N/A 01/27/2016   Procedure: ENDOBRONCHIAL ULTRASOUND;  Surgeon: Stephanie Acre, MD;  Location: [facility] ORS;  Service: Cardiopulmonary;  Laterality: N/A;  . LYMPH NODE BIOPSY    . VIDEO BRONCHOSCOPY N/A 01/27/2016   Procedure: VIDEO BRONCHOSCOPY WITH FLUORO;  Surgeon: Stephanie Acre, MD;  Location: [facility] ORS;  Service: Cardiopulmonary;  Laterality: N/A;    Prior to Admission medications   Medication Sig Start Date End Date Taking? Authorizing Provider  acetaminophen (TYLENOL) 500 MG tablet Take 500 mg by mouth every 6 (six) hours as needed.    [provider]  ADVAIR DISKUS 250-50 MCG/DOSE AEPB Inhale 1 puff into the lungs 2 (two) times daily. 09/08/18   Erin Fulling, MD  albuterol (ACCUNEB) 1.25 MG/3ML nebulizer solution Take 1 ampule by nebulization every 6 (six) hours as needed for wheezing.    [provider]  Azelastine-Fluticasone 137-50 MCG/ACT SUSP Place 1 spray into the nose 2 (two) times daily. 01/19/17   Erin Fulling, MD  cetirizine Harless Nakayama)  10 MG tablet Take 1 tablet (10 mg total) by mouth daily. 01/19/17   Erin Fulling, MD  diclofenac (VOLTAREN) 75 MG EC tablet Take 1 tablet (75 mg total) by mouth 2 (two) times daily. 09/26/15   Vivi Barrack, DPM  fluticasone (FLONASE) 50 MCG/ACT nasal spray Place 2 sprays into both nostrils daily. 12/04/14   [provider]  glycerin adult 2 g suppository Place 1 suppository rectally as needed for constipation.    [provider]    guaiFENesin-codeine 100-10 MG/5ML syrup Take 5 mLs by mouth every 4 (four) hours as needed for cough. 09/08/18   Erin Fulling, MD  ibuprofen (ADVIL,MOTRIN) 400 MG tablet Take 400 mg by mouth every 6 (six) hours as needed.    [provider]  lactulose (CHRONULAC) 10 GM/15ML solution Take 10 g by mouth 2 (two) times daily.    [provider]  methimazole (TAPAZOLE) 10 MG tablet Take 30 mg by mouth daily.  05/29/19   [provider]  metoprolol tartrate (LOPRESSOR) 100 MG tablet Take 1 tablet (100 mg total) by mouth 2 (two) times daily. 03/09/18   End, Cristal Deer, MD  omeprazole (PRILOSEC) 40 MG capsule Take 1 capsule (40 mg total) by mouth daily. 04/06/18   Wyline Mood, MD  ondansetron (ZOFRAN) 4 MG tablet Take 4 mg by mouth every 6 (six) hours as needed. 12/04/14   [provider]  polyethylene glycol powder (GLYCOLAX/MIRALAX) powder Take 17 g by mouth as needed. 12/04/14   [provider]  predniSONE (DELTASONE) 10 MG tablet Take 4 tablets (40 mg total) by mouth daily for 5 days. 07/29/20 08/03/20  Gilles Chiquito, MD  PROAIR HFA 108 [phone] Base) MCG/ACT inhaler Inhale 1 puff into the lungs every 6 (six) hours as needed. 09/08/18   Erin Fulling, MD  Probiotic Product (CULTURELLE IMMUNE DEFENSE PO) Take 1 capsule by mouth daily.    [provider]  ranitidine (ZANTAC) 75 MG tablet Take 75 mg by mouth 2 (two) times daily.    [provider]  SAPHRIS 5 MG SUBL 24 hr tablet Take 5 mg by mouth daily. 05/14/17   [provider]  Vitamin D, Cholecalciferol, 1000 units TABS Take 500 Units by mouth daily.     [provider]    Allergies Penicillin g  Family History  Problem Relation Age of Onset  . Lung cancer Mother     Social History Social History   Tobacco Use  . Smoking status: Never Smoker  . Smokeless tobacco: Never Used  Vaping Use  . Vaping Use: Never used  Substance Use Topics  . Alcohol use: No     Alcohol/week: 0.0 standard drinks  . Drug use: No    Review of Systems  Review of Systems  Constitutional: Negative for chills and fever.  HENT: Negative for sore throat.   Eyes: Negative for pain.  Respiratory: Positive for shortness of breath. Negative for cough and stridor.   Cardiovascular: Negative for chest pain.  Gastrointestinal: Negative for vomiting.  Genitourinary: Negative for dysuria.  Musculoskeletal: Positive for myalgias ( b/l forearms).  Skin: Negative for rash.  Neurological: Negative for seizures, loss of consciousness and headaches.  Psychiatric/Behavioral: Negative for suicidal ideas.  All other systems reviewed and are negative.     ____________________________________________   PHYSICAL EXAM:  VITAL SIGNS: ED Triage Vitals  Enc Vitals Group     BP 07/29/20 0030 (!) 144/88     Pulse Rate 07/29/20 0030 (!) 121  Resp 07/29/20 0030 18     Temp 07/29/20 0030 98.2 F (36.8 C)     Temp Source 07/29/20 0030 Oral     SpO2 07/29/20 0030 99 %     Weight 07/29/20 0031 124 lb (56.2 kg)     Height 07/29/20 0031 4\' 9"  (1.448 m)     Head Circumference --      Peak Flow --      Pain Score 07/29/20 0030 6     Pain Loc --      Pain Edu? --      Excl. in GC? --    Vitals:   07/29/20 0500 07/29/20 0530  BP: 132/61   Pulse: (!) 123 (!) 107  Resp: 19 20  Temp:    SpO2: 99% 98%   Physical Exam Vitals and nursing note reviewed.  Constitutional:      General: She is not in acute distress.    Appearance: She is well-developed.  HENT:     Head: Normocephalic and atraumatic.     Right Ear: External ear normal.     Left Ear: External ear normal.     Nose: Nose normal.  Eyes:     Conjunctiva/sclera: Conjunctivae normal.  Cardiovascular:     Rate and Rhythm: Regular rhythm. Tachycardia present.     Heart sounds: No murmur heard.   Pulmonary:     Effort: Pulmonary effort is normal. Tachypnea present. No respiratory distress.     Breath sounds: Wheezing  present.  Abdominal:     Palpations: Abdomen is soft.     Tenderness: There is no abdominal tenderness.  Musculoskeletal:     Cervical back: Neck supple.  Skin:    General: Skin is warm and dry.     Capillary Refill: Capillary refill takes less than 2 seconds.  Neurological:     Mental Status: She is alert and oriented to person, place, and time.  Psychiatric:        Mood and Affect: Mood normal.      ____________________________________________   LABS (all labs ordered are listed, but only abnormal results are displayed)  Labs Reviewed  BASIC METABOLIC PANEL - Abnormal; Notable for the following components:      Result Value   Glucose, Bld 118 (*)    All other components within normal limits  CBC - Abnormal; Notable for the following components:   RBC 5.78 (*)    MCV 74.0 (*)    MCH 23.7 (*)    All other components within normal limits  BLOOD GAS, VENOUS - Abnormal; Notable for the following components:   pH, Ven 7.45 (*)    pCO2, Ven 39 (*)    pO2, Ven 104.0 (*)    Acid-Base Excess 3.0 (*)    All other components within normal limits  ACETAMINOPHEN LEVEL - Abnormal; Notable for the following components:   Acetaminophen (Tylenol), Serum <10 (*)    All other components within normal limits  RESP PANEL BY RT-PCR (FLU A&B, COVID) ARPGX2  MAGNESIUM  PROCALCITONIN  SALICYLATE LEVEL  FIBRIN DERIVATIVES D-DIMER ([facility] ONLY)  TSH  POC URINE PREG, ED  POC URINE PREG, ED  TROPONIN I (HIGH SENSITIVITY)   ____________________________________________  EKG  Sinus tachycardia with a ventricular rate of 124, normal axis, unremarkable intervals, no clear evidence of acute ischemia or other significant arrhythmia. ____________________________________________  RADIOLOGY  ED MD interpretation: Chest x-ray has no clear focal consolidation, overt edema, large effusion, pneumothorax or other acute thoracic process.  CT head is unremarkable.  Official radiology report(s): DG  Chest 2 View  Result Date: 07/29/2020 CLINICAL DATA:  Bilateral arm tingling. EXAM: CHEST - 2 VIEW COMPARISON:  November 28, 2015 FINDINGS: There is stable architectural distortion in the bilateral upper lobes, left worse than right. There are chronic airspace opacities bilaterally, similar to prior study. The heart size is essentially stable. There is no pneumothorax. No large pleural effusion. IMPRESSION: No active cardiopulmonary disease. Electronically Signed   By: Katherine Mantlehristopher  Green M.D.   On: 07/29/2020 01:41   CT Head Wo Contrast  Result Date: 07/29/2020 CLINICAL DATA:  Tingling in both arms starting tonight. EXAM: CT HEAD WITHOUT CONTRAST TECHNIQUE: Contiguous axial images were obtained from the base of the skull through the vertex without intravenous contrast. COMPARISON:  02/26/2016 FINDINGS: Brain: No evidence of acute infarction, hemorrhage, hydrocephalus, extra-axial collection or mass lesion/mass effect. Vascular: No hyperdense vessel or unexpected calcification. Skull: Normal. Negative for fracture or focal lesion. Sinuses/Orbits: No acute finding. Other: None. IMPRESSION: No acute intracranial abnormalities. Electronically Signed   By: Burman NievesWilliam  Stevens M.D.   On: 07/29/2020 01:20    ____________________________________________   PROCEDURES  Procedure(s) performed (including Critical Care):  .1-3 Lead EKG Interpretation Performed by: Gilles ChiquitoSmith, Ziyanna Tolin P, MD Authorized by: Gilles ChiquitoSmith, Maly Lemarr P, MD     Interpretation: abnormal     ECG rate assessment: tachycardic     Rhythm: sinus rhythm     Ectopy: none     Conduction: normal       ____________________________________________   INITIAL IMPRESSION / ASSESSMENT AND PLAN / ED COURSE        Patient presents above to history exam for assessment of approximately 1 week of worsening shortness of breath associate with cough and nonbloody nonbilious vomiting diarrhea as well as some paresthesias myalgias in her bilateral forearms.   On arrival patient is tachypneic and wheezing and slightly tachycardic with otherwise stable vital signs on room air.  Differential includes but is not limited to asthma exacerbation secondary to possible viral illness given GI symptoms.  VBG has no evidence of hypercarbic respiratory failure and patient is not hypoxic.  However given wheezing she was treated with duo nebs and Solu-Medrol.  Chest x-ray has no focal consolidations suggestive of pneumonia and patient is not febrile and he is not of a white blood cell count.  Low suspicion for bacterial infection at this time.  Patient is noted to be tachycardic on arrival are no clear ischemic changes on ECG and given nonelevated troponin with duration of symptoms greater than 3 hours with suspicion for ACS.  Low suspicion for PE as D-dimer is less than 500.  Patient was given dose of her home metoprolol with improvement of her heart rate as well as IV fluids for some mild dehydration.  BMP shows no significant electrolyte or metabolic derangements.  CBC has no evidence of significant anemia or other significant abnormality.  Salicylate level is within normal limits and have low suspicion for toxicity at this time given report of only 4 tablets over 12 hours.  Assuming for levels undetectable.  CT head obtained in triage given report of numbness shows no acute intracranial abnormalities.  In addition patient denies any numbness to me she has no focal neurological deficits suggest CVA or other acute intracranial process.  My assessment patient had significant provement in her wheezing and work of breathing and states she felt much better.  Given improvement in vital signs and symptoms with otherwise reassuring exam and  work-up presentation is consistent with asthma exacerbation but patient is a for discharge plan for close outpatient PCP follow-up.  Rx written for steroids.  Patient states she has albuterol at home.  Discharge stable condition.  Return cautions  advised discussed.   ____________________________________________   FINAL CLINICAL IMPRESSION(S) / ED DIAGNOSES  Final diagnoses:  Exacerbation of asthma, unspecified asthma severity, unspecified whether persistent  Dehydration    Medications  ipratropium-albuterol (DUONEB) 0.5-2.5 (3) MG/3ML nebulizer solution 9 mL (9 mLs Nebulization Given 07/29/20 0139)  methylPREDNISolone sodium succinate (SOLU-MEDROL) 125 mg/2 mL injection 125 mg (125 mg Intravenous Given 07/29/20 0138)  ondansetron (ZOFRAN) injection 4 mg (4 mg Intravenous Given 07/29/20 0139)  lactated ringers bolus 1,000 mL (0 mLs Intravenous Stopped 07/29/20 0328)  lactated ringers bolus 1,000 mL (1,000 mLs Intravenous New Bag/Given 07/29/20 0328)  metoprolol tartrate (LOPRESSOR) tablet 100 mg (100 mg Oral Given 07/29/20 0452)     ED Discharge Orders         Ordered    predniSONE (DELTASONE) 10 MG tablet  Daily        07/29/20 0543           Note:  This document was prepared using Dragon voice recognition software and may include unintentional dictation errors.   Gilles Chiquito, MD 07/29/20 [phone]

## 2020-07-29 NOTE — ED Triage Notes (Signed)
Patient reports tingling in both arms that started about an hour prior to arrival.  States concerned she might have taken too much aspirin.  Reports she taking otc back and body pain reliever has 500 mg nsaid and caffine 32.5 mg per tablet and she took 2 this morning and 2 this evening prior to symptoms starting.

## 2020-08-03 ENCOUNTER — Other Ambulatory Visit: Payer: Self-pay

## 2020-08-03 ENCOUNTER — Emergency Department: Payer: Medicaid Other

## 2020-08-03 ENCOUNTER — Encounter: Payer: Self-pay | Admitting: Radiology

## 2020-08-03 ENCOUNTER — Inpatient Hospital Stay
Admission: EM | Admit: 2020-08-03 | Discharge: 2020-08-05 | DRG: 871 | Disposition: A | Discharge status: Home Health | Payer: Medicaid Other | Attending: Family Medicine | Admitting: Family Medicine

## 2020-08-03 ENCOUNTER — Inpatient Hospital Stay: Payer: Medicaid Other

## 2020-08-03 DIAGNOSIS — Z7951 Long term (current) use of inhaled steroids: Secondary | ICD-10-CM | POA: Diagnosis not present

## 2020-08-03 DIAGNOSIS — Z79899 Other long term (current) drug therapy: Secondary | ICD-10-CM

## 2020-08-03 DIAGNOSIS — J470 Bronchiectasis with acute lower respiratory infection: Secondary | ICD-10-CM | POA: Diagnosis present

## 2020-08-03 DIAGNOSIS — A419 Sepsis, unspecified organism: Secondary | ICD-10-CM | POA: Diagnosis present

## 2020-08-03 DIAGNOSIS — E039 Hypothyroidism, unspecified: Secondary | ICD-10-CM | POA: Diagnosis present

## 2020-08-03 DIAGNOSIS — J121 Respiratory syncytial virus pneumonia: Secondary | ICD-10-CM | POA: Diagnosis present

## 2020-08-03 DIAGNOSIS — Z23 Encounter for immunization: Secondary | ICD-10-CM

## 2020-08-03 DIAGNOSIS — Z9114 Patient's other noncompliance with medication regimen: Secondary | ICD-10-CM

## 2020-08-03 DIAGNOSIS — F32A Depression, unspecified: Secondary | ICD-10-CM

## 2020-08-03 DIAGNOSIS — R079 Chest pain, unspecified: Secondary | ICD-10-CM | POA: Diagnosis not present

## 2020-08-03 DIAGNOSIS — M797 Fibromyalgia: Secondary | ICD-10-CM | POA: Diagnosis present

## 2020-08-03 DIAGNOSIS — J471 Bronchiectasis with (acute) exacerbation: Secondary | ICD-10-CM | POA: Diagnosis present

## 2020-08-03 DIAGNOSIS — Z791 Long term (current) use of non-steroidal anti-inflammatories (NSAID): Secondary | ICD-10-CM | POA: Diagnosis not present

## 2020-08-03 DIAGNOSIS — E0591 Thyrotoxicosis, unspecified with thyrotoxic crisis or storm: Secondary | ICD-10-CM

## 2020-08-03 DIAGNOSIS — Z20822 Contact with and (suspected) exposure to covid-19: Secondary | ICD-10-CM | POA: Diagnosis present

## 2020-08-03 DIAGNOSIS — D860 Sarcoidosis of lung: Secondary | ICD-10-CM | POA: Diagnosis present

## 2020-08-03 DIAGNOSIS — D509 Iron deficiency anemia, unspecified: Secondary | ICD-10-CM | POA: Diagnosis present

## 2020-08-03 DIAGNOSIS — E0590 Thyrotoxicosis, unspecified without thyrotoxic crisis or storm: Secondary | ICD-10-CM | POA: Diagnosis present

## 2020-08-03 DIAGNOSIS — J4540 Moderate persistent asthma, uncomplicated: Secondary | ICD-10-CM | POA: Diagnosis present

## 2020-08-03 DIAGNOSIS — I361 Nonrheumatic tricuspid (valve) insufficiency: Secondary | ICD-10-CM | POA: Diagnosis not present

## 2020-08-03 DIAGNOSIS — R109 Unspecified abdominal pain: Secondary | ICD-10-CM

## 2020-08-03 DIAGNOSIS — D869 Sarcoidosis, unspecified: Secondary | ICD-10-CM | POA: Diagnosis not present

## 2020-08-03 DIAGNOSIS — I340 Nonrheumatic mitral (valve) insufficiency: Secondary | ICD-10-CM | POA: Diagnosis not present

## 2020-08-03 DIAGNOSIS — J189 Pneumonia, unspecified organism: Secondary | ICD-10-CM

## 2020-08-03 LAB — URINALYSIS, COMPLETE (UACMP) WITH MICROSCOPIC
Bacteria, UA: NONE SEEN
Bilirubin Urine: NEGATIVE
Glucose, UA: 50 mg/dL — AB
Hgb urine dipstick: NEGATIVE
Ketones, ur: NEGATIVE mg/dL
Leukocytes,Ua: NEGATIVE
Nitrite: NEGATIVE
Protein, ur: NEGATIVE mg/dL
Specific Gravity, Urine: 1.003 — ABNORMAL LOW (ref 1.005–1.030)
pH: 7 (ref 5.0–8.0)

## 2020-08-03 LAB — CBC WITH DIFFERENTIAL/PLATELET
Abs Immature Granulocytes: 0.04 10*3/uL (ref 0.00–0.07)
Abs Immature Granulocytes: 0.03 10*3/uL (ref 0.00–0.07)
Basophils Absolute: 0 10*3/uL (ref 0.0–0.1)
Basophils Absolute: 0 10*3/uL (ref 0.0–0.1)
Basophils Relative: 0 %
Basophils Relative: 0 %
Eosinophils Absolute: 0 10*3/uL (ref 0.0–0.5)
Eosinophils Absolute: 0 10*3/uL (ref 0.0–0.5)
Eosinophils Relative: 0 %
Eosinophils Relative: 0 %
HCT: 42 % (ref 36.0–46.0)
HCT: 39.2 % (ref 36.0–46.0)
Hemoglobin: 13.7 g/dL (ref 12.0–15.0)
Hemoglobin: 12.5 g/dL (ref 12.0–15.0)
Immature Granulocytes: 0 %
Immature Granulocytes: 0 %
Lymphocytes Relative: 12 %
Lymphocytes Relative: 9 %
Lymphs Abs: 1.2 10*3/uL (ref 0.7–4.0)
Lymphs Abs: 0.9 10*3/uL (ref 0.7–4.0)
MCH: 24 pg — ABNORMAL LOW (ref 26.0–34.0)
MCH: 23.5 pg — ABNORMAL LOW (ref 26.0–34.0)
MCHC: 32.6 g/dL (ref 30.0–36.0)
MCHC: 31.9 g/dL (ref 30.0–36.0)
MCV: 73.6 fL — ABNORMAL LOW (ref 80.0–100.0)
MCV: 73.7 fL — ABNORMAL LOW (ref 80.0–100.0)
Monocytes Absolute: 0.4 10*3/uL (ref 0.1–1.0)
Monocytes Absolute: 0.4 10*3/uL (ref 0.1–1.0)
Monocytes Relative: 3 %
Monocytes Relative: 4 %
Neutro Abs: 8.8 10*3/uL — ABNORMAL HIGH (ref 1.7–7.7)
Neutro Abs: 8.8 10*3/uL — ABNORMAL HIGH (ref 1.7–7.7)
Neutrophils Relative %: 85 %
Neutrophils Relative %: 87 %
Platelets: 285 10*3/uL (ref 150–400)
Platelets: 264 10*3/uL (ref 150–400)
RBC: 5.71 MIL/uL — ABNORMAL HIGH (ref 3.87–5.11)
RBC: 5.32 MIL/uL — ABNORMAL HIGH (ref 3.87–5.11)
RDW: 12.1 % (ref 11.5–15.5)
RDW: 12.2 % (ref 11.5–15.5)
WBC: 10.5 10*3/uL (ref 4.0–10.5)
WBC: 10.1 10*3/uL (ref 4.0–10.5)
nRBC: 0 % (ref 0.0–0.2)
nRBC: 0 % (ref 0.0–0.2)

## 2020-08-03 LAB — COMPREHENSIVE METABOLIC PANEL
ALT: 13 U/L (ref 0–44)
AST: 15 U/L (ref 15–41)
Albumin: 3.3 g/dL — ABNORMAL LOW (ref 3.5–5.0)
Alkaline Phosphatase: 94 U/L (ref 38–126)
Anion gap: 11 (ref 5–15)
BUN: 6 mg/dL (ref 6–20)
CO2: 22 mmol/L (ref 22–32)
Calcium: 8.8 mg/dL — ABNORMAL LOW (ref 8.9–10.3)
Chloride: 101 mmol/L (ref 98–111)
Creatinine, Ser: 0.6 mg/dL (ref 0.44–1.00)
GFR, Estimated: >60 mL/min (ref >60)
Glucose, Bld: 198 mg/dL — ABNORMAL HIGH (ref 70–99)
Potassium: 4 mmol/L (ref 3.5–5.1)
Sodium: 134 mmol/L — ABNORMAL LOW (ref 135–145)
Total Bilirubin: 0.6 mg/dL (ref 0.3–1.2)
Total Protein: 7.1 g/dL (ref 6.5–8.1)

## 2020-08-03 LAB — LACTIC ACID, PLASMA
Lactic Acid, Venous: 2.5 mmol/L (ref 0.5–1.9)
Lactic Acid, Venous: 1 mmol/L (ref 0.5–1.9)
Lactic Acid, Venous: 4.1 mmol/L (ref 0.5–1.9)
Lactic Acid, Venous: 0.9 mmol/L (ref 0.5–1.9)

## 2020-08-03 LAB — POC URINE PREG, ED: Preg Test, Ur: NEGATIVE

## 2020-08-03 LAB — TROPONIN I (HIGH SENSITIVITY)
Troponin I (High Sensitivity): 3 ng/L (ref <18)
Troponin I (High Sensitivity): 3 ng/L (ref <18)

## 2020-08-03 LAB — PROTIME-INR
INR: 1 (ref 0.8–1.2)
INR: 1.1 (ref 0.8–1.2)
Prothrombin Time: 13.2 seconds (ref 11.4–15.2)
Prothrombin Time: 13.4 seconds (ref 11.4–15.2)

## 2020-08-03 LAB — APTT
aPTT: <24 seconds — ABNORMAL LOW (ref 24–36)
aPTT: 25 seconds (ref 24–36)

## 2020-08-03 LAB — RESP PANEL BY RT-PCR (FLU A&B, COVID) ARPGX2
Influenza A by PCR: NEGATIVE
Influenza B by PCR: NEGATIVE
SARS Coronavirus 2 by RT PCR: NEGATIVE

## 2020-08-03 MED ORDER — METOPROLOL TARTRATE 50 MG PO TABS: 100.0000 mg | ORAL_TABLET | Freq: Two times a day (BID) | ORAL | Status: DC

## 2020-08-03 MED ORDER — METHYLPREDNISOLONE SODIUM SUCC 40 MG IJ SOLR
40.0000 mg | Freq: Four times a day (QID) | INTRAMUSCULAR | Status: AC
  Administered 2020-08-03 – 2020-08-04 (×4): 40 mg via INTRAVENOUS
  Filled 2020-08-03 (×4): qty 1

## 2020-08-03 MED ORDER — SODIUM CHLORIDE 0.9 % IV SOLN: 1000.0000 mL | INTRAVENOUS | Status: DC

## 2020-08-03 MED ORDER — PREDNISONE 20 MG PO TABS
40.0000 mg | ORAL_TABLET | Freq: Every day | ORAL | Status: DC
  Administered 2020-08-04 – 2020-08-05 (×2): 40 mg via ORAL
  Filled 2020-08-03 (×2): qty 2

## 2020-08-03 MED ORDER — BISACODYL 10 MG RE SUPP
10.0000 mg | Freq: Once | RECTAL | Status: DC
  Filled 2020-08-03: qty 1

## 2020-08-03 MED ORDER — POLYETHYLENE GLYCOL 3350 17 G PO PACK
17.0000 g | PACK | Freq: Every day | ORAL | Status: DC
  Administered 2020-08-03 – 2020-08-04 (×2): 17 g via ORAL
  Filled 2020-08-03 (×3): qty 1

## 2020-08-03 MED ORDER — METHIMAZOLE 10 MG PO TABS
30.0000 mg | ORAL_TABLET | Freq: Every day | ORAL | Status: DC
  Administered 2020-08-03 – 2020-08-05 (×3): 30 mg via ORAL
  Filled 2020-08-03 (×3): qty 3

## 2020-08-03 MED ORDER — IPRATROPIUM-ALBUTEROL 0.5-2.5 (3) MG/3ML IN SOLN
3.0000 mL | Freq: Once | RESPIRATORY_TRACT | Status: AC
  Administered 2020-08-03: 3 mL via RESPIRATORY_TRACT
  Filled 2020-08-03: qty 3

## 2020-08-03 MED ORDER — SODIUM CHLORIDE 0.9 % IV BOLUS (SEPSIS)
1000.0000 mL | Freq: Once | INTRAVENOUS | Status: AC
  Administered 2020-08-03: 1000 mL via INTRAVENOUS

## 2020-08-03 MED ORDER — SODIUM CHLORIDE 0.9 % IV BOLUS (SEPSIS): 1000.0000 mL | Freq: Once | INTRAVENOUS | Status: DC

## 2020-08-03 MED ORDER — LACTULOSE 10 GM/15ML PO SOLN
10.0000 g | Freq: Two times a day (BID) | ORAL | Status: DC
  Administered 2020-08-03 – 2020-08-05 (×3): 10 g via ORAL
  Filled 2020-08-03 (×4): qty 30

## 2020-08-03 MED ORDER — VITAMIN D 25 MCG (1000 UNIT) PO TABS
500.0000 [IU] | ORAL_TABLET | Freq: Every day | ORAL | Status: DC
  Administered 2020-08-03 – 2020-08-05 (×3): 500 [IU] via ORAL
  Filled 2020-08-03 (×3): qty 1

## 2020-08-03 MED ORDER — PROPRANOLOL HCL 20 MG PO TABS: 10.0000 mg | ORAL_TABLET | Freq: Three times a day (TID) | ORAL | Status: DC

## 2020-08-03 MED ORDER — LORATADINE 10 MG PO TABS
10.0000 mg | ORAL_TABLET | Freq: Every day | ORAL | Status: DC
  Administered 2020-08-04 – 2020-08-05 (×2): 10 mg via ORAL
  Filled 2020-08-03 (×2): qty 1

## 2020-08-03 MED ORDER — ACETAMINOPHEN 500 MG PO TABS: 500.0000 mg | ORAL_TABLET | Freq: Four times a day (QID) | ORAL | Status: DC | PRN

## 2020-08-03 MED ORDER — ENOXAPARIN SODIUM 40 MG/0.4ML ~~LOC~~ SOLN
40.0000 mg | SUBCUTANEOUS | Status: DC
  Administered 2020-08-03: 40 mg via SUBCUTANEOUS
  Filled 2020-08-03: qty 0.4

## 2020-08-03 MED ORDER — CULTURELLE IMMUNE DEFENSE PO CAPS: ORAL_CAPSULE | Freq: Every day | ORAL | Status: DC

## 2020-08-03 MED ORDER — INFLUENZA VAC SPLIT QUAD 0.5 ML IM SUSY: 0.5000 mL | PREFILLED_SYRINGE | INTRAMUSCULAR | Status: DC

## 2020-08-03 MED ORDER — LEVOFLOXACIN IN D5W 750 MG/150ML IV SOLN: 750.0000 mg | Freq: Once | INTRAVENOUS | Status: DC

## 2020-08-03 MED ORDER — METOPROLOL TARTRATE 50 MG PO TABS
50.0000 mg | ORAL_TABLET | Freq: Two times a day (BID) | ORAL | Status: DC
  Administered 2020-08-03 – 2020-08-05 (×4): 50 mg via ORAL
  Filled 2020-08-03 (×4): qty 1

## 2020-08-03 MED ORDER — IOHEXOL 350 MG/ML SOLN
75.0000 mL | Freq: Once | INTRAVENOUS | Status: AC | PRN
  Administered 2020-08-03: 75 mL via INTRAVENOUS

## 2020-08-03 MED ORDER — PANTOPRAZOLE SODIUM 40 MG PO TBEC
40.0000 mg | DELAYED_RELEASE_TABLET | Freq: Every day | ORAL | Status: DC
  Administered 2020-08-03 – 2020-08-05 (×3): 40 mg via ORAL
  Filled 2020-08-03 (×3): qty 1

## 2020-08-03 MED ORDER — SODIUM CHLORIDE 0.9 % IV SOLN: INTRAVENOUS | Status: DC

## 2020-08-03 MED ORDER — METOPROLOL TARTRATE 5 MG/5ML IV SOLN: 5.0000 mg | Freq: Once | INTRAVENOUS | Status: DC

## 2020-08-03 MED ORDER — ALBUTEROL SULFATE (2.5 MG/3ML) 0.083% IN NEBU: 1.5000 mL | INHALATION_SOLUTION | Freq: Four times a day (QID) | RESPIRATORY_TRACT | Status: DC | PRN

## 2020-08-03 MED ORDER — ASENAPINE MALEATE 5 MG SL SUBL
5.0000 mg | SUBLINGUAL_TABLET | Freq: Every day | SUBLINGUAL | Status: DC
  Administered 2020-08-04 – 2020-08-05 (×2): 5 mg via SUBLINGUAL
  Filled 2020-08-03 (×5): qty 1

## 2020-08-03 MED ORDER — SODIUM CHLORIDE 0.9 % IV SOLN
1.0000 g | Freq: Once | INTRAVENOUS | Status: AC
  Administered 2020-08-03: 1 g via INTRAVENOUS
  Filled 2020-08-03: qty 10

## 2020-08-03 MED ORDER — MORPHINE SULFATE (PF) 2 MG/ML IV SOLN
INTRAVENOUS | Status: AC
  Administered 2020-08-03: 1 mg via INTRAVENOUS
  Filled 2020-08-03: qty 1

## 2020-08-03 MED ORDER — FLUTICASONE PROPIONATE 50 MCG/ACT NA SUSP: 2.0000 | Freq: Every day | NASAL | Status: DC

## 2020-08-03 MED ORDER — ASENAPINE MALEATE 5 MG SL SUBL
5.0000 mg | SUBLINGUAL_TABLET | Freq: Two times a day (BID) | SUBLINGUAL | Status: DC
  Filled 2020-08-03: qty 1

## 2020-08-03 MED ORDER — PNEUMOCOCCAL VAC POLYVALENT 25 MCG/0.5ML IJ INJ: 0.5000 mL | INJECTION | INTRAMUSCULAR | Status: DC

## 2020-08-03 MED ORDER — SODIUM CHLORIDE 0.9 % IV SOLN
500.0000 mg | Freq: Once | INTRAVENOUS | Status: DC
  Filled 2020-08-03: qty 500

## 2020-08-03 MED ORDER — MORPHINE SULFATE (PF) 2 MG/ML IV SOLN: 1.0000 mg | Freq: Once | INTRAVENOUS | Status: AC

## 2020-08-03 MED ORDER — LEVOFLOXACIN IN D5W 750 MG/150ML IV SOLN
750.0000 mg | INTRAVENOUS | Status: DC
  Administered 2020-08-03 – 2020-08-04 (×2): 750 mg via INTRAVENOUS
  Filled 2020-08-03 (×3): qty 150

## 2020-08-03 NOTE — ED Provider Notes (Signed)
[facility] Emergency Department Provider Note   ____________________________________________    I have reviewed the triage vital signs and the nursing notes.   HISTORY  Chief Complaint Weakness and Tachycardia     HPI Jennifer [name] is a 49 y.o. female with a history of sarcoidosis, tachycardia who presents today with complaints of weakness, fatigue, mild shortness of breath.  She also reports a history of asthma.  She has had chills but does not think that she has had a fever, she is concerned that she could possibly have a pneumonia, she does report cough.  Has had both Covid vaccines.  No sick contacts reported.  No nausea or vomiting.  No chest pain reported.  Past Medical History:  Diagnosis Date  . Anxiety   . Asthma   . Chest pain    a. 08/2018 MV: EF 62%, no ischemia/scar.  . Depression   . Fibromyalgia   . History of echocardiogram    a. 09/2017 Echo: Nl LV size/fxn/wall thickness.  . Hyperthyroidism   . Incontinence   . Lymphadenopathy   . Pneumonia    bilateral community acquired   . PVC's (premature ventricular contractions)   . PVC's (premature ventricular contractions)    a. 06/2016 24h Holter: Sinus rhythm, freq PVCs (13% burden), rare PACs; b. 03/2017 24h Holter: Predominantly sinus rhythm. Freq PVCs (6% burden).  . Sarcoidosis   . Sepsis (HCC)   . Shortness of breath dyspnea   . Tachycardia     Patient Active Problem List   Diagnosis Date Noted  . Sepsis (HCC) 08/03/2020  . Thyrotoxicosis   . Depression   . Atypical chest pain 09/15/2017  . PVC's (premature ventricular contractions) 09/15/2017  . 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 Sarcoidosis 03/23/2016  . Pneumonia due to Pseudomonas (HCC) 02/10/2016  . Pulmonary cavitary lesion   . Hilar adenopathy   . Current use of steroid medication 06/17/2015  . Long term current use of systemic steroids 01/03/2015  . Sinus tachycardia 01/03/2015  . Fungal pneumonia  11/23/2014  . Cough 11/22/2014  . SOB (shortness of breath) 11/22/2014  . Cavitary lesion of lung 11/22/2014    Past Surgical History:  Procedure Laterality Date  . CESAREAN SECTION     1992/1994  . ENDOBRONCHIAL ULTRASOUND N/A 01/27/2016   Procedure: ENDOBRONCHIAL ULTRASOUND;  Surgeon: Stephanie Acre, MD;  Location: [facility] ORS;  Service: Cardiopulmonary;  Laterality: N/A;  . LYMPH NODE BIOPSY    . VIDEO BRONCHOSCOPY N/A 01/27/2016   Procedure: VIDEO BRONCHOSCOPY WITH FLUORO;  Surgeon: Stephanie Acre, MD;  Location: [facility] ORS;  Service: Cardiopulmonary;  Laterality: N/A;    Prior to Admission medications   Medication Sig Start Date End Date Taking? Authorizing Provider  acetaminophen (TYLENOL) 500 MG tablet Take 500-1,000 mg by mouth every 6 (six) hours as needed for mild pain or fever.    Yes [provider]  ibuprofen (ADVIL) 200 MG tablet Take 400-600 mg by mouth every 6 (six) hours as needed for fever or mild pain.    Yes [provider]  predniSONE (DELTASONE) 10 MG tablet Take 4 tablets (40 mg total) by mouth daily for 5 days. 07/29/20 08/03/20 Yes Gilles Chiquito, MD  PROAIR HFA 108 [phone] Base) MCG/ACT inhaler Inhale 1 puff into the lungs every 6 (six) hours as needed. 09/08/18  Yes Kasa, Wallis Bamberg, MD  ADVAIR DISKUS 250-50 MCG/DOSE AEPB Inhale 1 puff into the lungs 2 (two) times daily. 09/08/18   Erin Fulling, MD  asenapine (SAPHRIS) 5 MG SUBL 24 hr tablet Place 5 mg under the tongue daily.    [provider]  divalproex (DEPAKOTE ER) 500 MG 24 hr tablet Take 500 mg by mouth daily.     [provider]  methimazole (TAPAZOLE) 10 MG tablet Take 30 mg by mouth daily.  05/29/19   [provider]  metoprolol tartrate (LOPRESSOR) 100 MG tablet Take 1 tablet (100 mg total) by mouth 2 (two) times daily. 03/09/18   End, Cristal Deer, MD  omeprazole (PRILOSEC) 40 MG capsule Take 1 capsule (40 mg total) by mouth daily. 04/06/18   Wyline Mood, MD      Allergies Penicillin g  Family History  Problem Relation Age of Onset  . Lung cancer Mother     Social History Social History   Tobacco Use  . Smoking status: Never Smoker  . Smokeless tobacco: Never Used  Vaping Use  . Vaping Use: Never used  Substance Use Topics  . Alcohol use: No    Alcohol/week: 0.0 standard drinks  . Drug use: No    Review of Systems  Constitutional: Chills as above Eyes: No visual changes.  ENT: No sore throat. Cardiovascular: Denies chest pain. Respiratory: As above Gastrointestinal: No abdominal pain.  No nausea, no vomiting.   Genitourinary: Negative for dysuria. Musculoskeletal: Negative for back pain. Skin: Negative for rash. Neurological: Negative for headaches or weakness   ____________________________________________   PHYSICAL EXAM:  VITAL SIGNS: ED Triage Vitals  Enc Vitals Group     BP 08/03/20 0901 135/70     Pulse Rate 08/03/20 0901 (!) 138     Resp 08/03/20 0901 (!) 28     Temp 08/03/20 0901 98.2 F (36.8 C)     Temp Source 08/03/20 0901 Oral     SpO2 08/03/20 0901 97 %     Weight 08/03/20 0853 56 kg (123 lb 7.3 oz)     Height 08/03/20 0853 1.448 m (4\' 9" )     Head Circumference --      Peak Flow --      Pain Score 08/03/20 0853 0     Pain Loc --      Pain Edu? --      Excl. in GC? --     Constitutional: Alert and oriented.  Eyes: Conjunctivae are normal.  Head: Atraumatic. Nose: No congestion/rhinnorhea. Mouth/Throat: Mucous membranes are moist.   Neck:  Painless ROM Cardiovascular: Tachycardia, regular rhythm. Grossly normal heart sounds.  Good peripheral circulation. Respiratory: Normal respiratory effort.  No retractions. Lungs CTAB. Gastrointestinal: Soft and nontender. No distention.    Musculoskeletal: No lower extremity tenderness nor edema.  Warm and well perfused Neurologic:  Normal speech and language. No gross focal neurologic deficits are appreciated.  Skin:  Skin is warm, dry and intact. No  rash noted. Psychiatric: Mood and affect are normal. Speech and behavior are normal.  ____________________________________________   LABS (all labs ordered are listed, but only abnormal results are displayed)  Labs Reviewed  LACTIC ACID, PLASMA - Abnormal; Notable for the following components:      Result Value   Lactic Acid, Venous 2.5 (*)    All other components within normal limits  COMPREHENSIVE METABOLIC PANEL - Abnormal; Notable for the following components:   Sodium 134 (*)    Glucose, Bld 198 (*)    Calcium 8.8 (*)    Albumin 3.3 (*)    All other components within normal limits  CBC WITH DIFFERENTIAL/PLATELET - Abnormal; Notable  for the following components:   RBC 5.71 (*)    MCV 73.6 (*)    MCH 24.0 (*)    Neutro Abs 8.8 (*)    All other components within normal limits  URINALYSIS, COMPLETE (UACMP) WITH MICROSCOPIC - Abnormal; Notable for the following components:   Color, Urine STRAW (*)    APPearance CLEAR (*)    Specific Gravity, Urine 1.003 (*)    Glucose, UA 50 (*)    All other components within normal limits  APTT - Abnormal; Notable for the following components:   aPTT <24 (*)    All other components within normal limits  CBC WITH DIFFERENTIAL/PLATELET - Abnormal; Notable for the following components:   RBC 5.32 (*)    MCV 73.7 (*)    MCH 23.5 (*)    Neutro Abs 8.8 (*)    All other components within normal limits  RESP PANEL BY RT-PCR (FLU A&B, COVID) ARPGX2  CULTURE, BLOOD (SINGLE)  URINE CULTURE  CULTURE, BLOOD (ROUTINE X 2)  CULTURE, BLOOD (ROUTINE X 2)  LACTIC ACID, PLASMA  PROTIME-INR  HIV ANTIBODY (ROUTINE TESTING W REFLEX)  LACTIC ACID, PLASMA  PROTIME-INR  APTT  THYROID PANEL WITH TSH  LACTIC ACID, PLASMA  POC URINE PREG, ED  TROPONIN I (HIGH SENSITIVITY)   ____________________________________________  EKG  ED ECG REPORT I, Jene Every, the attending physician, personally viewed and interpreted this ECG.  Date:  08/03/2020  Rhythm: Sinus tachycardia QRS Axis: normal Intervals: normal ST/T Wave abnormalities: normal Narrative Interpretation: PVCs noted  ____________________________________________  RADIOLOGY  Chest x-ray viewed by me, no clear pneumonia ____________________________________________   PROCEDURES  Procedure(s) performed: No  Procedures   Critical Care performed: No ____________________________________________   INITIAL IMPRESSION / ASSESSMENT AND PLAN / ED COURSE  Pertinent labs & imaging results that were available during my care of the patient were reviewed by me and considered in my medical decision making (see chart for details).  Patient presents with significant tachycardia, tachypnea complaints of shortness of breath chills and cough.  Differential includes viral upper respiratory infection, pneumonia, bronchospasm/asthma  Will obtain labs, chest x-ray, placed in the cardiac monitor treat initially with a DuoNeb while we await further diagnostics.  Chest x-ray without clear infiltrate, will obtain CT given continued tachycardia and tachypnea  CT angiography suspicious for possible pneumonia will cover with IV Rocephin and azithromycin, have discussed with hospitalist for admission    ____________________________________________   FINAL CLINICAL IMPRESSION(S) / ED DIAGNOSES  Pneumonia      Note:  This document was prepared using Dragon voice recognition software and may include unintentional dictation errors.   Jene Every, MD 08/03/20 1505

## 2020-08-03 NOTE — ED Notes (Signed)
Date and time results received: 08/03/20 0940  Test: Lactic Acid  Critical Value: 2.5  Name of Provider Notified: Dr. Cyril Loosen, MD

## 2020-08-03 NOTE — ED Triage Notes (Signed)
Pt via EMS from work. Pt called out for weakness and feeling like her heart was racing. Pt is A&Ox4 and NAD. Pt has a productive cough with green phlegm. Pt does have a hx of pneumonia. CBG 227 with no hx of DM. EMS reports HR in the 150s. Pt is A&Ox4.

## 2020-08-03 NOTE — Consult Note (Signed)
PHARMACY -  BRIEF ANTIBIOTIC NOTE   Pharmacy has received consult(s) for levofloxacin from an ED provider.  The patient's profile has been reviewed for ht/wt/allergies/indication/available labs.    One time order(s) placed for levofloxacin have been replaced with levofloxacin 750mg  q24h x5d regimen. First dose received in the hour.  Further antibiotics/pharmacy consults should be ordered by admitting physician if indicated.                       Thank you, 08/03/2020  2:16 PM

## 2020-08-03 NOTE — ED Notes (Signed)
Medication Reconciliation Report  For Home History Technicians  HIGHLIGHTS:  1. The patient WAS personally interviewed 2. If not, what was the main source used: NOT APPLICABLE 3. Does the patient appear to take any anti-coagulation agents (e.g. warfarin, Eliquis or Xarelto): NO 4. Does the patient appear to take any anti-convulsant agents (e.g. divalproex, levetiracetam or phenytoin): NO 5. Does the patient appear to use any insulin products (e.g. Lantus, Novolin or Humalog): NO 6. Does the patient appear to take any "beta-blockers" (e.g. metoprolol, carvedilol or bisoprolol: UNKNOWN  BARRIERS:  1. Were there any barriers that prevented or complicated the medication reconciliation process: YES 2. If yes, what was the primary barrier encountered: Poor historian 3. Does the patient appear compliant with prescribed medications: NO 4. Does the patient express any barriers with compliance: YES 5. What is the primary barrier the patient reports: Access to primary care   NOTES:[Include any concerns, remarks or complaints the patient expresses regarding medication therapy. Any observations or other information that might be useful to the treatment team can also be included. Immediate needs or concerns should be referred to the RN or appropriate member of the treatment team.]  The patient admits to being unable to see physicians for about a year. Pharmacy records show now maintenance medication activity since 03/2020. Patient does report all medications (including SAPHRIS and DIVALPROEX) as medications needed to be restarted as she has been without for several months.   Elmo Putt, CPhT [facility] [address] [place], [place] [address] [phone]  ** The above is intended solely for informational and/or communicative purposes. It should in no way be considered an endorsement of any specific treatment, therapy or action. **

## 2020-08-03 NOTE — Plan of Care (Signed)

## 2020-08-03 NOTE — ED Notes (Signed)
Pt to CT

## 2020-08-03 NOTE — H&P (Signed)
History and Physical    DEBORAHA GOAR HKV:[id number] DOB: [birth year] DOA: 08/03/2020  PCP: Judeen Hammans, MD   Patient coming from: Home  I have personally briefly reviewed patient's old medical records in [facility]  Chief Complaint: Cough  HPI: ARIANNI GALLEGO is a 49 y.o. female with medical history significant for pulmonary sarcoidosis, hyperthyroidism, depression who presents to the ER via EMS for evaluation of weakness and feeling like her heart has been racing.  She has a cough productive of green phlegm but denies having any fever or chills.  She denies having any sick contacts.  Per EMS her heart rate was in the 150s when they arrived.  Patient is up-to-date with her complete vaccination. She complains of feeling very weak and has had episodes of nausea, vomiting and diffuse abdominal pain.  She is unable to tell me when her last bowel movement was states that she has issues with constipation. She denies having urinary frequency, nocturia, dysuria, no dizziness, no lightheadedness, no headache She was seen in the emergency room 5 days prior to her hospitalization and treated for an acute asthma exacerbation and discharged home on a steroid taper without any significant improvement in her symptoms. Labs show sodium 134, potassium 4.0, chloride 101, bicarb 22, BUN 6, creatinine 0.61, calcium 8.8, alkaline phosphatase 94, albumin 3.3, AST 15, ALT 13 lactic acid 2.5 >> 1.0, white count 10.5, hemoglobin 13.7, hematocrit 42.0, MCV 70.6, RDW 12.1, platelet count 285, PT 13.2, INR 1.0 TSH < 0.010 Respiratory viral panel is negative CT angiogram of the chest shows no evidence of pulmonary embolus.  Chronic bilateral cylindrical bronchiectasis, upper lobe volume loss and scarring. Scattered peribronchial and subpleural areas of ground-glass airspace opacities and macro nodularity, improved overall when compared to 2017. These findings may represent atypical/viral pneumonia, or  sequela of sarcoidosis. Persistent cavitary lesion in the left upper lobe containing intramural soft tissue mass. The area of cavitation is similar to 2017 study with slight enlargement of the intramural soft tissue mass. Differential diagnosis includes cavitary lesion containing mycetoma, versus cavitary pneumonia. Given the minimal progression from 2017 malignancy is unlikely. Twelve-lead EKG reviewed by me shows shows sinus tachycardia   ED Course: Patient is a 49 year old African-American female with a history significant for pulmonary sarcoidosis and hyperthyroidism who presents to the emergency room via EMS for evaluation of palpitations and weakness.  Per EMS patient had a heart rate of 150bpm in the field.  Her initial lactate was elevated at 2.5 and CT angiogram of the chest done to rule out PE shows cylindrical bronchiectasis.  Patient's TSH is < 0.010 consistent with noncompliance with her methimazole.  She will be admitted to the hospital for further evaluation and treatment.  Review of Systems: As per HPI otherwise 10 point review of systems negative.    Past Medical History:  Diagnosis Date  . Anxiety   . Asthma   . Chest pain    a. 08/2018 MV: EF 62%, no ischemia/scar.  . Depression   . Fibromyalgia   . History of echocardiogram    a. 09/2017 Echo: Nl LV size/fxn/wall thickness.  . Hyperthyroidism   . Incontinence   . Lymphadenopathy   . Pneumonia    bilateral community acquired   . PVC's (premature ventricular contractions)   . PVC's (premature ventricular contractions)    a. 06/2016 24h Holter: Sinus rhythm, freq PVCs (13% burden), rare PACs; b. 03/2017 24h Holter: Predominantly sinus rhythm. Freq PVCs (6% burden).  Marland Kitchen  Sarcoidosis   . Sepsis (HCC)   . Shortness of breath dyspnea   . Tachycardia     Past Surgical History:  Procedure Laterality Date  . CESAREAN SECTION     1992/1994  . ENDOBRONCHIAL ULTRASOUND N/A 01/27/2016   Procedure: ENDOBRONCHIAL ULTRASOUND;   Surgeon: Stephanie Acre, MD;  Location: [facility] ORS;  Service: Cardiopulmonary;  Laterality: N/A;  . LYMPH NODE BIOPSY    . VIDEO BRONCHOSCOPY N/A 01/27/2016   Procedure: VIDEO BRONCHOSCOPY WITH FLUORO;  Surgeon: Stephanie Acre, MD;  Location: [facility] ORS;  Service: Cardiopulmonary;  Laterality: N/A;     reports that she has never smoked. She has never used smokeless tobacco. She reports that she does not drink alcohol and does not use drugs.  Allergies  Allergen Reactions  . Penicillin G Hives    Family History  Problem Relation Age of Onset  . Lung cancer Mother      Prior to Admission medications   Medication Sig Start Date End Date Taking? Authorizing Provider  acetaminophen (TYLENOL) 500 MG tablet Take 500 mg by mouth every 6 (six) hours as needed.    [provider]  ADVAIR DISKUS 250-50 MCG/DOSE AEPB Inhale 1 puff into the lungs 2 (two) times daily. 09/08/18   Erin Fulling, MD  albuterol (ACCUNEB) 1.25 MG/3ML nebulizer solution Take 1 ampule by nebulization every 6 (six) hours as needed for wheezing.    [provider]  Azelastine-Fluticasone 137-50 MCG/ACT SUSP Place 1 spray into the nose 2 (two) times daily. 01/19/17   Erin Fulling, MD  cetirizine (ZYRTEC) 10 MG tablet Take 1 tablet (10 mg total) by mouth daily. 01/19/17   Erin Fulling, MD  diclofenac (VOLTAREN) 75 MG EC tablet Take 1 tablet (75 mg total) by mouth 2 (two) times daily. 09/26/15   Vivi Barrack, DPM  fluticasone (FLONASE) 50 MCG/ACT nasal spray Place 2 sprays into both nostrils daily. 12/04/14   [provider]  glycerin adult 2 g suppository Place 1 suppository rectally as needed for constipation.    [provider]  guaiFENesin-codeine 100-10 MG/5ML syrup Take 5 mLs by mouth every 4 (four) hours as needed for cough. 09/08/18   Erin Fulling, MD  ibuprofen (ADVIL,MOTRIN) 400 MG tablet Take 400 mg by mouth every 6 (six) hours as needed.    [provider]  lactulose (CHRONULAC) 10  GM/15ML solution Take 10 g by mouth 2 (two) times daily.    [provider]  methimazole (TAPAZOLE) 10 MG tablet Take 30 mg by mouth daily.  05/29/19   [provider]  metoprolol tartrate (LOPRESSOR) 100 MG tablet Take 1 tablet (100 mg total) by mouth 2 (two) times daily. 03/09/18   End, Cristal Deer, MD  omeprazole (PRILOSEC) 40 MG capsule Take 1 capsule (40 mg total) by mouth daily. 04/06/18   Wyline Mood, MD  ondansetron (ZOFRAN) 4 MG tablet Take 4 mg by mouth every 6 (six) hours as needed. 12/04/14   [provider]  polyethylene glycol powder (GLYCOLAX/MIRALAX) powder Take 17 g by mouth as needed. 12/04/14   [provider]  predniSONE (DELTASONE) 10 MG tablet Take 4 tablets (40 mg total) by mouth daily for 5 days. 07/29/20 08/03/20  Gilles Chiquito, MD  PROAIR HFA 108 [phone] Base) MCG/ACT inhaler Inhale 1 puff into the lungs every 6 (six) hours as needed. 09/08/18   Erin Fulling, MD  Probiotic Product (CULTURELLE IMMUNE DEFENSE PO) Take 1 capsule by mouth daily.    [provider]  ranitidine (ZANTAC) 75 MG tablet Take 75 mg by mouth 2 (two) times daily.    [provider]  SAPHRIS 5 MG SUBL 24 hr tablet Take 5 mg by mouth daily. 05/14/17   [provider]  Vitamin D, Cholecalciferol, 1000 units TABS Take 500 Units by mouth daily.     [provider]    Physical Exam: Vitals:   08/03/20 1030 08/03/20 1200 08/03/20 1230 08/03/20 1300  BP: (!) 126/57 (!) 132/59 128/64 122/64  Pulse: (!) 124 (!) 129 (!) 133 (!) 128  Resp: (!) 27 (!) 26 (!) 35 (!) 23  Temp:      TempSrc:      SpO2: 98% 97% 96% 98%  Weight:      Height:         Vitals:   08/03/20 1030 08/03/20 1200 08/03/20 1230 08/03/20 1300  BP: (!) 126/57 (!) 132/59 128/64 122/64  Pulse: (!) 124 (!) 129 (!) 133 (!) 128  Resp: (!) 27 (!) 26 (!) 35 (!) 23  Temp:      TempSrc:      SpO2: 98% 97% 96% 98%  Weight:      Height:        Constitutional: NAD, alert and  oriented x 3.  Acutely ill-appearing Eyes: PERRL, lids and conjunctivae normal ENMT: Mucous membranes are moist.  Neck: normal, supple, no masses, no thyromegaly Respiratory: Bilateral air entry, scattered wheezing, no crackles.  Increased work of breathing, conversational dyspnea Cardiovascular: Tachycardic, no murmurs / rubs / gallops. No extremity edema. 2+ pedal pulses. No carotid bruits.  Abdomen: Diffuse tenderness over anterior abdominal wall, no masses palpated. No hepatosplenomegaly. Bowel sounds positive.  Musculoskeletal: no clubbing / cyanosis. No joint deformity upper and lower extremities.  Skin: no rashes, lesions, ulcers.  Neurologic: No gross focal neurologic deficit. Psychiatric: Normal mood and affect.   Labs on Admission: I have personally reviewed following labs and imaging studies  CBC: Recent Labs  Lab 07/29/20 0050 08/03/20 0857  WBC 4.2 10.5  NEUTROABS  --  8.8*  HGB 13.7 13.7  HCT 42.8 42.0  MCV 74.0* 73.6*  PLT 223 285   Basic Metabolic Panel: Recent Labs  Lab 07/29/20 0050 08/03/20 0857  NA 138 134*  K 4.1 4.0  CL 101 101  CO2 24 22  GLUCOSE 118* 198*  BUN 9 6  CREATININE 0.50 0.60  CALCIUM 9.2 8.8*  MG 2.0  --    GFR: Estimated Creatinine Clearance: 61.2 mL/min (by C-G formula based on SCr of 0.6 mg/dL). Liver Function Tests: Recent Labs  Lab 08/03/20 0857  AST 15  ALT 13  ALKPHOS 94  BILITOT 0.6  PROT 7.1  ALBUMIN 3.3*   No results for input(s): LIPASE, AMYLASE in the last 168 hours. No results for input(s): AMMONIA in the last 168 hours. Coagulation Profile: Recent Labs  Lab 08/03/20 0935  INR 1.0   Cardiac Enzymes: No results for input(s): CKTOTAL, CKMB, CKMBINDEX, TROPONINI in the last 168 hours. BNP (last 3 results) No results for input(s): PROBNP in the last 8760 hours. HbA1C: No results for input(s): HGBA1C in the last 72 hours. CBG: No results for input(s): GLUCAP in the last 168 hours. Lipid Profile: No  results for input(s): CHOL, HDL, LDLCALC, TRIG, CHOLHDL, LDLDIRECT in the last 72 hours. Thyroid Function Tests: No results for input(s): TSH, T4TOTAL, FREET4, T3FREE, THYROIDAB in the last 72 hours. Anemia Panel: No results for input(s): VITAMINB12, FOLATE, FERRITIN, TIBC, IRON, RETICCTPCT in the  last 72 hours. Urine analysis:    Component Value Date/Time   COLORURINE STRAW (A) 08/03/2020 1006   APPEARANCEUR CLEAR (A) 08/03/2020 1006   APPEARANCEUR CLEAR 11/28/2014 0338   LABSPEC 1.003 (L) 08/03/2020 1006   LABSPEC 1.005 11/28/2014 0338   PHURINE 7.0 08/03/2020 1006   GLUCOSEU 50 (A) 08/03/2020 1006   GLUCOSEU NEGATIVE 11/28/2014 0338   HGBUR NEGATIVE 08/03/2020 1006   BILIRUBINUR NEGATIVE 08/03/2020 1006   BILIRUBINUR NEGATIVE 11/28/2014 0338   KETONESUR NEGATIVE 08/03/2020 1006   PROTEINUR NEGATIVE 08/03/2020 1006   NITRITE NEGATIVE 08/03/2020 1006   LEUKOCYTESUR NEGATIVE 08/03/2020 1006   LEUKOCYTESUR NEGATIVE 11/28/2014 0338    Radiological Exams on Admission: CT Angio Chest PE W and/or Wo Contrast  Result Date: 08/03/2020 CLINICAL DATA:  Productive cough. Increased heart rate. Weakness. EXAM: CT ANGIOGRAPHY CHEST WITH CONTRAST TECHNIQUE: Multidetector CT imaging of the chest was performed using the standard protocol during bolus administration of intravenous contrast. Multiplanar CT image reconstructions and MIPs were obtained to evaluate the vascular anatomy. CONTRAST:  11mL OMNIPAQUE IOHEXOL 350 MG/ML SOLN COMPARISON:  March 20, 2016 FINDINGS: Cardiovascular: Satisfactory opacification of the pulmonary arteries to the segmental level. No evidence of pulmonary embolism. Normal heart size. No pericardial effusion. Mediastinum/Nodes: No enlarged mediastinal, hilar, or axillary lymph nodes. Thyroid gland, trachea, and esophagus demonstrate no significant findings. Lungs/Pleura: Chronic bilateral cylindrical bronchiectasis, upper lobe volume loss and scarring. Again seen is cavitary  lesion in the left upper lobe containing intramural soft tissue mass. The area of cavitation is similar to 2017 study with slight enlargement of the intramural soft tissue mass. Scattered peribronchial and subpleural areas of ground-glass airspace opacities and macro nodularity, improved overall when compared to 2017. Upper Abdomen: No acute abnormality. Musculoskeletal: No chest wall abnormality. No acute or significant osseous findings. Review of the MIP images confirms the above findings. IMPRESSION: 1. No evidence of pulmonary embolus. 2. Chronic bilateral cylindrical bronchiectasis, upper lobe volume loss and scarring. 3. Scattered peribronchial and subpleural areas of ground-glass airspace opacities and macro nodularity, improved overall when compared to 2017. These findings may represent atypical/viral pneumonia, or sequela of sarcoidosis. 4. Persistent cavitary lesion in the left upper lobe containing intramural soft tissue mass. The area of cavitation is similar to 2017 study with slight enlargement of the intramural soft tissue mass. Differential diagnosis includes cavitary lesion containing mycetoma, versus cavitary pneumonia. Given the minimal progression from 2017 malignancy is unlikely. Electronically Signed   By: Ted Mcalpine M.D.   On: 08/03/2020 12:16   DG Chest Portable 1 View  Result Date: 08/03/2020 CLINICAL DATA:  Shortness of breath, weakness and tachycardia for 2 weeks. EXAM: PORTABLE CHEST 1 VIEW COMPARISON:  07/29/2020 FINDINGS: Heart size appears within normal limits. No pleural effusion or interstitial edema. Chronic, bilateral reticular interstitial opacities with upper lobe areas of scarring and architectural distortion. No superimposed airspace consolidation identified. IMPRESSION: 1. Stable appearance of chronic interstitial changes compatible with the history of sarcoid. No superimposed acute airspace consolidation noted. Electronically Signed   By: Signa Kell M.D.    On: 08/03/2020 09:36    EKG: Independently reviewed.  Sinus tachycardia  Assessment/Plan Principal Problem:   Sepsis (HCC) Active Problems:   Sarcoidosis   Thyrotoxicosis   Depression      Sepsis (POA) As evidenced by tachycardia, tachypnea, CT scan findings suggestive of pneumonia, white cell count with a left shift elevated at 2.5 Aggressive IV fluid resuscitation Treat patient empirically with Levaquin adjusted to renal function to cover  Pseudomonas and other gram-negative organisms Follow-up results of blood and sputum culture    Community-acquired pneumonia Continue empiric antibiotic therapy with Levaquin     Thyrotoxicosis Patient presents for evaluation of palpitations She has a history of hyperthyroidism and is supposed to be on methimazole  ??Compliance TSH is low (< 0.010) Follow up results for free thyroxine level Continue methimazole and metoprolol   Sarcoidosis flare Continue scheduled and as needed bronchodilator therapy Place patient on systemic steroids Empiric antibiotic therapy with Levaquin   DVT prophylaxis: Lovenox Code Status: Full code Family Communication: Greater than 50% of time was spent discussing patient's condition and plan of care with her at the bedside.  All questions and concerns have been addressed.  She verbalizes understanding and agrees with the plan. Disposition Plan: Back to previous home environment Consults called: Pulmonology    Adylee Leonardo MD [facility]     08/03/2020, 2:35 PM

## 2020-08-03 NOTE — Progress Notes (Signed)
OT Cancellation Note  Patient Details Name: Jennifer [name] MRN: [id number] DOB: 23-Mar-1971   Cancelled Treatment:    Reason Eval/Treat Not Completed: Medical issues which prohibited therapy. Consult received, chart reviewed. Pt noted with elevated HR (120's-130's). Will hold OT evaluation and re-attempt at later date/time as HR is better controlled for exertional activity.   Richrd Prime, MPH, MS, OTR/L ascom [phone] 08/03/20, 1:50 PM

## 2020-08-03 NOTE — ED Notes (Signed)
Pt requesting crackers at this time. Pt given cracker and water with approval from Dr. Cyril Loosen, MD.

## 2020-08-03 NOTE — Consult Note (Signed)
Pharmacy Antibiotic Note  Jennifer [name] is a 49 y.o. female admitted on 08/03/2020 with community-acquired pneumonia.  Pharmacy has been consulted for levofloxacin dosing.  Plan: This patient's current antibiotics will be continued without adjustments.  Levofloxacin 750mg  q24h x5d appropriate per current renal function. Last QTc on 11/27, lytes WNL, and at baseline creatinine.  Height: 4\' 9"  (144.8 cm) Weight: 56 kg (123 lb 7.3 oz) IBW/kg (Calculated) : 38.6  Temp (24hrs), Avg:98.2 F (36.8 C), Min:98.2 F (36.8 C), Max:98.2 F (36.8 C)  Recent Labs  Lab 07/29/20 0050 08/03/20 0857 08/03/20 1116  WBC 4.2 10.5  --   CREATININE 0.50 0.60  --   LATICACIDVEN  --  2.5* 1.0    Estimated Creatinine Clearance: 61.2 mL/min (by C-G formula based on SCr of 0.6 mg/dL).    Allergies  Allergen Reactions  . Penicillin G Hives    Antimicrobials this admission: 11/27 CTX 1g x1 Levofloxacin 750mg  q24h (11/27>>  Microbiology results: 11/27 BCx: sent/pending 11/27 UCx: sent/pending  COV-19 neg  Thank you for allowing pharmacy to be a part of this patient's care.  Fany Cavanaugh 08/03/2020 2:18 PM

## 2020-08-03 NOTE — Progress Notes (Signed)
Pt received to 2A, room 231 from ED.  Pt oriented to room and call bell.  Pt weighed and vs's done.  Tele monitor placed and verified.  See admit data base, assessment and vs's for further details.  St [address].

## 2020-08-04 ENCOUNTER — Inpatient Hospital Stay (HOSPITAL_COMMUNITY)
Admit: 2020-08-04 | Discharge: 2020-08-04 | Disposition: A | Discharge status: Home / Self Care | Payer: Medicaid Other | Attending: Internal Medicine | Admitting: Internal Medicine

## 2020-08-04 DIAGNOSIS — R079 Chest pain, unspecified: Secondary | ICD-10-CM | POA: Diagnosis not present

## 2020-08-04 DIAGNOSIS — I361 Nonrheumatic tricuspid (valve) insufficiency: Secondary | ICD-10-CM | POA: Diagnosis not present

## 2020-08-04 DIAGNOSIS — A419 Sepsis, unspecified organism: Secondary | ICD-10-CM | POA: Diagnosis not present

## 2020-08-04 DIAGNOSIS — I340 Nonrheumatic mitral (valve) insufficiency: Secondary | ICD-10-CM

## 2020-08-04 LAB — CBC
HCT: 36.4 % (ref 36.0–46.0)
Hemoglobin: 11.7 g/dL — ABNORMAL LOW (ref 12.0–15.0)
MCH: 23.9 pg — ABNORMAL LOW (ref 26.0–34.0)
MCHC: 32.1 g/dL (ref 30.0–36.0)
MCV: 74.3 fL — ABNORMAL LOW (ref 80.0–100.0)
Platelets: 252 10*3/uL (ref 150–400)
RBC: 4.9 MIL/uL (ref 3.87–5.11)
RDW: 12.2 % (ref 11.5–15.5)
WBC: 9.7 10*3/uL (ref 4.0–10.5)
nRBC: 0 % (ref 0.0–0.2)

## 2020-08-04 LAB — RESPIRATORY PANEL BY PCR

## 2020-08-04 LAB — URINE DRUG SCREEN, QUALITATIVE (ARMC ONLY)
Amphetamines, Ur Screen: NOT DETECTED
Barbiturates, Ur Screen: NOT DETECTED
Benzodiazepine, Ur Scrn: NOT DETECTED
Cannabinoid 50 Ng, Ur ~~LOC~~: NOT DETECTED
Cocaine Metabolite,Ur ~~LOC~~: NOT DETECTED
MDMA (Ecstasy)Ur Screen: NOT DETECTED
Methadone Scn, Ur: NOT DETECTED
Opiate, Ur Screen: NOT DETECTED
Phencyclidine (PCP) Ur S: NOT DETECTED

## 2020-08-04 LAB — BASIC METABOLIC PANEL
Anion gap: 9 (ref 5–15)
BUN: 8 mg/dL (ref 6–20)
CO2: 21 mmol/L — ABNORMAL LOW (ref 22–32)
Calcium: 8.8 mg/dL — ABNORMAL LOW (ref 8.9–10.3)
Chloride: 107 mmol/L (ref 98–111)
Creatinine, Ser: 0.52 mg/dL (ref 0.44–1.00)
GFR, Estimated: >60 mL/min (ref >60)
Glucose, Bld: 169 mg/dL — ABNORMAL HIGH (ref 70–99)
Potassium: 4.3 mmol/L (ref 3.5–5.1)
Sodium: 137 mmol/L (ref 135–145)

## 2020-08-04 LAB — EXPECTORATED SPUTUM ASSESSMENT W GRAM STAIN, RFLX TO RESP C

## 2020-08-04 LAB — ECHOCARDIOGRAM COMPLETE
AR max vel: 1.62 cm2
AV Peak grad: 9.9 mmHg
Ao pk vel: 1.57 m/s
Area-P 1/2: 5.54 cm2
Height: 57 in
S' Lateral: 3.23 cm
Weight: 1752 oz

## 2020-08-04 LAB — HIV ANTIBODY (ROUTINE TESTING W REFLEX): HIV Screen 4th Generation wRfx: NONREACTIVE

## 2020-08-04 LAB — URINE CULTURE: Culture: <10000 — AB

## 2020-08-04 MED ORDER — DIPHENHYDRAMINE HCL 25 MG PO CAPS
25.0000 mg | ORAL_CAPSULE | Freq: Every evening | ORAL | Status: DC | PRN
  Administered 2020-08-04 (×2): 25 mg via ORAL
  Filled 2020-08-04 (×2): qty 1

## 2020-08-04 MED ORDER — METOPROLOL TARTRATE 50 MG PO TABS
100.0000 mg | ORAL_TABLET | Freq: Two times a day (BID) | ORAL | Status: DC
  Administered 2020-08-04 – 2020-08-05 (×3): 100 mg via ORAL
  Filled 2020-08-04 (×3): qty 2

## 2020-08-04 MED ORDER — ALBUTEROL SULFATE HFA 108 (90 BASE) MCG/ACT IN AERS
1.0000 | INHALATION_SPRAY | Freq: Four times a day (QID) | RESPIRATORY_TRACT | Status: DC | PRN
  Filled 2020-08-04: qty 6.7

## 2020-08-04 MED ORDER — GUAIFENESIN-DM 100-10 MG/5ML PO SYRP
5.0000 mL | ORAL_SOLUTION | ORAL | Status: DC | PRN
  Administered 2020-08-04 (×3): 5 mL via ORAL
  Filled 2020-08-04 (×3): qty 5

## 2020-08-04 MED ORDER — DIVALPROEX SODIUM ER 500 MG PO TB24
500.0000 mg | ORAL_TABLET | Freq: Every day | ORAL | Status: DC
  Administered 2020-08-04 – 2020-08-05 (×2): 500 mg via ORAL
  Filled 2020-08-04 (×2): qty 1

## 2020-08-04 MED ORDER — MELATONIN 5 MG PO TABS
5.0000 mg | ORAL_TABLET | Freq: Every evening | ORAL | Status: DC | PRN
  Administered 2020-08-04 (×2): 5 mg via ORAL
  Filled 2020-08-04 (×2): qty 1

## 2020-08-04 MED ORDER — MOMETASONE FURO-FORMOTEROL FUM 200-5 MCG/ACT IN AERO
2.0000 | INHALATION_SPRAY | Freq: Two times a day (BID) | RESPIRATORY_TRACT | Status: DC
  Administered 2020-08-04 – 2020-08-05 (×3): 2 via RESPIRATORY_TRACT
  Filled 2020-08-04: qty 8.8

## 2020-08-04 MED ORDER — PROCHLORPERAZINE EDISYLATE 10 MG/2ML IJ SOLN
10.0000 mg | Freq: Four times a day (QID) | INTRAMUSCULAR | Status: DC | PRN
  Administered 2020-08-04: 10 mg via INTRAVENOUS
  Filled 2020-08-04 (×2): qty 2

## 2020-08-04 NOTE — Progress Notes (Signed)
Pt slept a little and verbalized relief of cough.

## 2020-08-04 NOTE — Progress Notes (Signed)
PROGRESS NOTE    Jennifer [name]  UVO:[id number] DOB: Feb 02, 1971 DOA: 08/03/2020 PCP: Herminio Commons, MD   Brief Narrative:  HPI: Jennifer [name] is a 49 y.o. female with medical history significant for pulmonary sarcoidosis, hyperthyroidism, depression who presents to the ER via EMS for evaluation of weakness and feeling like her heart has been racing.  She has a cough productive of green phlegm but denies having any fever or chills.  She denies having any sick contacts.  Per EMS her heart rate was in the 150s when they arrived.  Patient is up-to-date with her complete vaccination. She complains of feeling very weak and has had episodes of nausea, vomiting and diffuse abdominal pain.  She is unable to tell me when her last bowel movement was states that she has issues with constipation. She denies having urinary frequency, nocturia, dysuria, no dizziness, no lightheadedness, no headache She was seen in the emergency room 5 days prior to her hospitalization and treated for an acute asthma exacerbation and discharged home on a steroid taper without any significant improvement in her symptoms. Labs show sodium 134, potassium 4.0, chloride 101, bicarb 22, BUN 6, creatinine 0.61, calcium 8.8, alkaline phosphatase 94, albumin 3.3, AST 15, ALT 13 lactic acid 2.5 >> 1.0, white count 10.5, hemoglobin 13.7, hematocrit 42.0, MCV 70.6, RDW 12.1, platelet count 285, PT 13.2, INR 1.0 TSH < 0.010 Respiratory viral panel is negative CT angiogram of the chest shows no evidence of pulmonary embolus.  Chronic bilateral cylindrical bronchiectasis, upper lobe volume loss and scarring. Scattered peribronchial and subpleural areas of ground-glass airspace opacities and macro nodularity, improved overall when compared to 2017. These findings may represent atypical/viral pneumonia, or sequela of sarcoidosis. Persistent cavitary lesion in the left upper lobe containing intramural soft tissue mass. The area of  cavitation is similar to 2017 study with slight enlargement of the intramural soft tissue mass. Differential diagnosis includes cavitary lesion containing mycetoma, versus cavitary pneumonia. Given the minimal progression from 2017 malignancy is unlikely. Twelve-lead EKG reviewed by me shows shows sinus tachycardia   ED Course: Patient is a 49 year old African-American female with a history significant for pulmonary sarcoidosis and hyperthyroidism who presents to the emergency room via EMS for evaluation of palpitations and weakness.  Per EMS patient had a heart rate of 150bpm in the field.  Her initial lactate was elevated at 2.5 and CT angiogram of the chest done to rule out PE shows cylindrical bronchiectasis.  Patient's TSH is < 0.010 consistent with noncompliance with her methimazole.  She will be admitted to the hospital for further evaluation and treatment.  Assessment & Plan:   Principal Problem:   Sepsis ([place]) Active Problems:   Sarcoidosis   Thyrotoxicosis   Depression   Sepsis secondary to community-acquired pneumonia (POA) in a patient with history of sarcoidosis and bronchiectasis: Met sepsis criteria based on CT findings of pneumonia, tachycardia and tachypnea upon presentation.  Patient still complains of some shortness of breath but she has not required any oxygen.  No leukocytosis.  No fever.  Lungs clear to auscultation.  Seen by pulmonology.  Extensive work-up ordered for sarcoidosis.  She also seems to have some acute flareup of bronchiectasis.  Continue prednisone as well as Levaquin as pulmonary has recommended.  Patient is negative for influenza, COVID-19.  Checking respiratory viral panel.  Presumed thyrotoxicosis?  Patient presented with palpitations and tachycardia.  Her last known TSH was 6 days ago on 07/29/2020 which was<0.010.  Thyroid panel is  still pending.  She is on methimazole already and her tachycardia and palpitation have resolved.  Continue metoprolol and  methimazole which are her home medications.  DVT prophylaxis:    Code Status: Full Code  Family Communication:  None present at bedside.  Plan of care discussed with patient in length and he verbalized understanding and agreed with it.  Status is: Inpatient  Remains inpatient appropriate because:Inpatient level of care appropriate due to severity of illness   Dispo: The patient is from: Home              Anticipated d/c is to: Home              Anticipated d/c date is: 1 day              Patient currently is not medically stable to d/c.        Estimated body mass index is 23.7 kg/m as calculated from the following:   Height as of this encounter: 4' 9"  (1.448 m).   Weight as of this encounter: 49.7 kg.      Nutritional status:               Consultants:   PCCM  Procedures:   None  Antimicrobials:  Anti-infectives (From admission, onward)   Start     Dose/Rate Route Frequency Ordered Stop   08/03/20 1415  levofloxacin (LEVAQUIN) IVPB 750 mg  Status:  Discontinued        750 mg 100 mL/hr over 90 Minutes Intravenous  Once 08/03/20 1402 08/03/20 1419   08/03/20 1330  levofloxacin (LEVAQUIN) IVPB 750 mg        750 mg 100 mL/hr over 90 Minutes Intravenous Every 24 hours 08/03/20 1329 08/08/20 1329   08/03/20 1245  cefTRIAXone (ROCEPHIN) 1 g in sodium chloride 0.9 % 100 mL IVPB        1 g 200 mL/hr over 30 Minutes Intravenous  Once 08/03/20 1232 08/03/20 1352   08/03/20 1245  azithromycin (ZITHROMAX) 500 mg in sodium chloride 0.9 % 250 mL IVPB  Status:  Discontinued        500 mg 250 mL/hr over 60 Minutes Intravenous  Once 08/03/20 1232 08/03/20 1347         Subjective: Patient seen and examined.  She still complains of shortness of breath but improved from yesterday.  Shortness of breath is mostly exacerbated with exertion and lying flat.  Still has cough but that has improved as well.  Objective: Vitals:   08/04/20 0134 08/04/20 0448 08/04/20 0735  08/04/20 1252  BP: (!) 124/112 139/77 136/61 133/67  Pulse: 99 100 98 97  Resp: 18 18 18 18   Temp: (!) 97.5 F (36.4 C) (!) 97.3 F (36.3 C) (!) 97.5 F (36.4 C) 98.4 F (36.9 C)  TempSrc: Oral Oral Oral Oral  SpO2: 100% 97% 97% 97%  Weight:  49.7 kg    Height:        Intake/Output Summary (Last 24 hours) at 08/04/2020 1408 Last data filed at 08/04/2020 0506 Gross per 24 hour  Intake 2222.38 ml  Output 1500 ml  Net 722.38 ml   Filed Weights   08/03/20 0853 08/03/20 2056 08/04/20 0448  Weight: 56 kg 50.4 kg 49.7 kg    Examination:  General exam: Appears calm and comfortable  Respiratory system: Clear to auscultation. Respiratory effort normal. Cardiovascular system: S1 & S2 heard, RRR. No JVD, murmurs, rubs, gallops or clicks. No pedal edema. Gastrointestinal system: Abdomen is  nondistended, soft and nontender. No organomegaly or masses felt. Normal bowel sounds heard. Central nervous system: Alert and oriented. No focal neurological deficits. Extremities: Symmetric 5 x 5 power. Skin: No rashes, lesions or ulcers Psychiatry: Judgement and insight appear normal. Mood & affect appropriate.    Data Reviewed: I have personally reviewed following labs and imaging studies  CBC: Recent Labs  Lab 07/29/20 0050 08/03/20 0857 08/03/20 1435 08/04/20 0511  WBC 4.2 10.5 10.1 9.7  NEUTROABS  --  8.8* 8.8*  --   HGB 13.7 13.7 12.5 11.7*  HCT 42.8 42.0 39.2 36.4  MCV 74.0* 73.6* 73.7* 74.3*  PLT 223 285 264 754   Basic Metabolic Panel: Recent Labs  Lab 07/29/20 0050 08/03/20 0857 08/04/20 0511  NA 138 134* 137  K 4.1 4.0 4.3  CL 101 101 107  CO2 24 22 21*  GLUCOSE 118* 198* 169*  BUN 9 6 8   CREATININE 0.50 0.60 0.52  CALCIUM 9.2 8.8* 8.8*  MG 2.0  --   --    GFR: Estimated Creatinine Clearance: 57.7 mL/min (by C-G formula based on SCr of 0.52 mg/dL). Liver Function Tests: Recent Labs  Lab 08/03/20 0857  AST 15  ALT 13  ALKPHOS 94  BILITOT 0.6  PROT 7.1    ALBUMIN 3.3*   No results for input(s): LIPASE, AMYLASE in the last 168 hours. No results for input(s): AMMONIA in the last 168 hours. Coagulation Profile: Recent Labs  Lab 08/03/20 0935 08/03/20 1435  INR 1.0 1.1   Cardiac Enzymes: No results for input(s): CKTOTAL, CKMB, CKMBINDEX, TROPONINI in the last 168 hours. BNP (last 3 results) No results for input(s): PROBNP in the last 8760 hours. HbA1C: No results for input(s): HGBA1C in the last 72 hours. CBG: No results for input(s): GLUCAP in the last 168 hours. Lipid Profile: No results for input(s): CHOL, HDL, LDLCALC, TRIG, CHOLHDL, LDLDIRECT in the last 72 hours. Thyroid Function Tests: No results for input(s): TSH, T4TOTAL, FREET4, T3FREE, THYROIDAB in the last 72 hours. Anemia Panel: No results for input(s): VITAMINB12, FOLATE, FERRITIN, TIBC, IRON, RETICCTPCT in the last 72 hours. Sepsis Labs: Recent Labs  Lab 07/29/20 0050 08/03/20 0857 08/03/20 1116 08/03/20 1435 08/03/20 2242  PROCALCITON <0.10  --   --   --   --   LATICACIDVEN  --  2.5* 1.0 4.1* 0.9    Recent Results (from the past 240 hour(s))  Resp Panel by RT-PCR (Flu A&B, Covid) Nasopharyngeal Swab     Status: None   Collection Time: 07/29/20  2:04 AM   Specimen: Nasopharyngeal Swab; Nasopharyngeal(NP) swabs in vial transport medium  Result Value Ref Range Status   SARS Coronavirus 2 by RT PCR NEGATIVE NEGATIVE Final    Comment: (NOTE) SARS-CoV-2 target nucleic acids are NOT DETECTED.  The SARS-CoV-2 RNA is generally detectable in upper respiratory specimens during the acute phase of infection. The lowest concentration of SARS-CoV-2 viral copies this assay can detect is 138 copies/mL. A negative result does not preclude SARS-Cov-2 infection and should not be used as the sole basis for treatment or other patient management decisions. A negative result may occur with  improper specimen collection/handling, submission of specimen other than  nasopharyngeal swab, presence of viral mutation(s) within the areas targeted by this assay, and inadequate number of viral copies(<138 copies/mL). A negative result must be combined with clinical observations, patient history, and epidemiological information. The expected result is Negative.  Fact Sheet for Patients:  EntrepreneurPulse.com.au  Fact Sheet for Healthcare  Providers:  IncredibleEmployment.be  This test is no t yet approved or cleared by the [place] and  has been authorized for detection and/or diagnosis of SARS-CoV-2 by FDA under an Emergency Use Authorization (EUA). This EUA will remain  in effect (meaning this test can be used) for the duration of the COVID-19 declaration under Section 564(b)(1) of the Act, 21 U.S.C.section 360bbb-3(b)(1), unless the authorization is terminated  or revoked sooner.       Influenza A by PCR NEGATIVE NEGATIVE Final   Influenza B by PCR NEGATIVE NEGATIVE Final    Comment: (NOTE) The Xpert Xpress SARS-CoV-2/FLU/RSV plus assay is intended as an aid in the diagnosis of influenza from Nasopharyngeal swab specimens and should not be used as a sole basis for treatment. Nasal washings and aspirates are unacceptable for Xpert Xpress SARS-CoV-2/FLU/RSV testing.  Fact Sheet for Patients: EntrepreneurPulse.com.au  Fact Sheet for Healthcare Providers: IncredibleEmployment.be  This test is not yet approved or cleared by the [place] FDA and has been authorized for detection and/or diagnosis of SARS-CoV-2 by FDA under an Emergency Use Authorization (EUA). This EUA will remain in effect (meaning this test can be used) for the duration of the COVID-19 declaration under Section 564(b)(1) of the Act, 21 U.S.C. section 360bbb-3(b)(1), unless the authorization is terminated or revoked.  Performed at [facility], [address] [place], [place]  [address]   Resp Panel by RT-PCR (Flu A&B, Covid) Nasopharyngeal Swab     Status: None   Collection Time: 08/03/20  8:57 AM   Specimen: Nasopharyngeal Swab; Nasopharyngeal(NP) swabs in vial transport medium  Result Value Ref Range Status   SARS Coronavirus 2 by RT PCR NEGATIVE NEGATIVE Final    Comment: (NOTE) SARS-CoV-2 target nucleic acids are NOT DETECTED.  The SARS-CoV-2 RNA is generally detectable in upper respiratory specimens during the acute phase of infection. The lowest concentration of SARS-CoV-2 viral copies this assay can detect is 138 copies/mL. A negative result does not preclude SARS-Cov-2 infection and should not be used as the sole basis for treatment or other patient management decisions. A negative result may occur with  improper specimen collection/handling, submission of specimen other than nasopharyngeal swab, presence of viral mutation(s) within the areas targeted by this assay, and inadequate number of viral copies(<138 copies/mL). A negative result must be combined with clinical observations, patient history, and epidemiological information. The expected result is Negative.  Fact Sheet for Patients:  EntrepreneurPulse.com.au  Fact Sheet for Healthcare Providers:  IncredibleEmployment.be  This test is no t yet approved or cleared by the [place] FDA and  has been authorized for detection and/or diagnosis of SARS-CoV-2 by FDA under an Emergency Use Authorization (EUA). This EUA will remain  in effect (meaning this test can be used) for the duration of the COVID-19 declaration under Section 564(b)(1) of the Act, 21 U.S.C.section 360bbb-3(b)(1), unless the authorization is terminated  or revoked sooner.       Influenza A by PCR NEGATIVE NEGATIVE Final   Influenza B by PCR NEGATIVE NEGATIVE Final    Comment: (NOTE) The Xpert Xpress SARS-CoV-2/FLU/RSV plus assay is intended as an aid in the diagnosis of influenza from  Nasopharyngeal swab specimens and should not be used as a sole basis for treatment. Nasal washings and aspirates are unacceptable for Xpert Xpress SARS-CoV-2/FLU/RSV testing.  Fact Sheet for Patients: EntrepreneurPulse.com.au  Fact Sheet for Healthcare Providers: IncredibleEmployment.be  This test is not yet approved or cleared by the [place] FDA and has been  authorized for detection and/or diagnosis of SARS-CoV-2 by FDA under an Emergency Use Authorization (EUA). This EUA will remain in effect (meaning this test can be used) for the duration of the COVID-19 declaration under Section 564(b)(1) of the Act, 21 U.S.C. section 360bbb-3(b)(1), unless the authorization is terminated or revoked.  Performed at [facility], [address] [place], [place] [address]   Blood culture (routine single)     Status: None (Preliminary result)   Collection Time: 08/03/20  9:35 AM   Specimen: BLOOD  Result Value Ref Range Status   Specimen Description BLOOD LEFT HAND  Final   Special Requests   Final    BOTTLES DRAWN AEROBIC AND ANAEROBIC Blood Culture adequate volume   Culture   Final    NO GROWTH < 24 HOURS Performed at [facility], [address], [place], [place] [address]    Report Status PENDING  Incomplete  Urine culture     Status: Abnormal   Collection Time: 08/03/20 10:06 AM   Specimen: Urine, Random  Result Value Ref Range Status   Specimen Description   Final    URINE, RANDOM Performed at [facility], [address], [place], [place] [address]    Special Requests   Final    NONE Performed at [facility], [address], [place], [place] [address]    Culture (A)  Final    <10,000 COLONIES/mL INSIGNIFICANT GROWTH Performed at [facility], [place] [address] [place], [place] [address]    Report Status 08/04/2020 FINAL  Final  Expectorated sputum assessment w rflx to resp cult     Status:  None   Collection Time: 08/04/20 10:34 AM   Specimen: Sputum  Result Value Ref Range Status   Specimen Description SPUTUM  Final   Special Requests SPUTUM  Final   Sputum evaluation   Final    THIS SPECIMEN IS ACCEPTABLE FOR SPUTUM CULTURE Performed at [facility], [address], [place], [place] [address]    Report Status 08/04/2020 FINAL  Final      Radiology Studies: CT Angio Chest PE W and/or Wo Contrast  Result Date: 08/03/2020 CLINICAL DATA:  Productive cough. Increased heart rate. Weakness. EXAM: CT ANGIOGRAPHY CHEST WITH CONTRAST TECHNIQUE: Multidetector CT imaging of the chest was performed using the standard protocol during bolus administration of intravenous contrast. Multiplanar CT image reconstructions and MIPs were obtained to evaluate the vascular anatomy. CONTRAST:  58m OMNIPAQUE IOHEXOL 350 MG/ML SOLN COMPARISON:  March 20, 2016 FINDINGS: Cardiovascular: Satisfactory opacification of the pulmonary arteries to the segmental level. No evidence of pulmonary embolism. Normal heart size. No pericardial effusion. Mediastinum/Nodes: No enlarged mediastinal, hilar, or axillary lymph nodes. Thyroid gland, trachea, and esophagus demonstrate no significant findings. Lungs/Pleura: Chronic bilateral cylindrical bronchiectasis, upper lobe volume loss and scarring. Again seen is cavitary lesion in the left upper lobe containing intramural soft tissue mass. The area of cavitation is similar to 2017 study with slight enlargement of the intramural soft tissue mass. Scattered peribronchial and subpleural areas of ground-glass airspace opacities and macro nodularity, improved overall when compared to 2017. Upper Abdomen: No acute abnormality. Musculoskeletal: No chest wall abnormality. No acute or significant osseous findings. Review of the MIP images confirms the above findings. IMPRESSION: 1. No evidence of pulmonary embolus. 2. Chronic bilateral cylindrical bronchiectasis, upper lobe  volume loss and scarring. 3. Scattered peribronchial and subpleural areas of ground-glass airspace opacities and macro nodularity, improved overall when compared to 2017. These  findings may represent atypical/viral pneumonia, or sequela of sarcoidosis. 4. Persistent cavitary lesion in the left upper lobe containing intramural soft tissue mass. The area of cavitation is similar to 2017 study with slight enlargement of the intramural soft tissue mass. Differential diagnosis includes cavitary lesion containing mycetoma, versus cavitary pneumonia. Given the minimal progression from 2017 malignancy is unlikely. Electronically Signed   By: Fidela Salisbury M.D.   On: 08/03/2020 12:16   DG Chest Portable 1 View  Result Date: 08/03/2020 CLINICAL DATA:  Shortness of breath, weakness and tachycardia for 2 weeks. EXAM: PORTABLE CHEST 1 VIEW COMPARISON:  07/29/2020 FINDINGS: Heart size appears within normal limits. No pleural effusion or interstitial edema. Chronic, bilateral reticular interstitial opacities with upper lobe areas of scarring and architectural distortion. No superimposed airspace consolidation identified. IMPRESSION: 1. Stable appearance of chronic interstitial changes compatible with the history of sarcoid. No superimposed acute airspace consolidation noted. Electronically Signed   By: Kerby Moors M.D.   On: 08/03/2020 09:36   DG Abd 2 Views  Result Date: 08/03/2020 CLINICAL DATA:  Abdominal pain. EXAM: ABDOMEN - 2 VIEW COMPARISON:  None. FINDINGS: The bowel gas pattern is normal. There is no evidence of free air. No radio-opaque calculi or other significant radiographic abnormality is seen. IMPRESSION: Negative. Electronically Signed   By: Fidela Salisbury M.D.   On: 08/03/2020 14:47    Scheduled Meds:  asenapine  5 mg Sublingual Daily   bisacodyl  10 mg Rectal Once   cholecalciferol  500 Units Oral Daily   divalproex  500 mg Oral Daily   influenza vac split quadrivalent PF  0.5  mL Intramuscular Tomorrow-1000   lactulose  10 g Oral BID   loratadine  10 mg Oral Daily   methimazole  30 mg Oral Daily   metoprolol tartrate  100 mg Oral BID   metoprolol tartrate  50 mg Oral BID   mometasone-formoterol  2 puff Inhalation BID   pantoprazole  40 mg Oral Daily   pneumococcal 23 valent vaccine  0.5 mL Intramuscular Tomorrow-1000   polyethylene glycol  17 g Oral Daily   predniSONE  40 mg Oral Q breakfast   Continuous Infusions:  sodium chloride 125 mL/hr at 08/04/20 0404   levofloxacin (LEVAQUIN) IV 750 mg (08/04/20 1230)     LOS: 1 day   Time spent: 37 minutes  Darliss Cheney, MD [facility]  08/04/2020, 2:08 PM   To contact the attending provider between 7A-7P or the covering provider during after hours 7P-7A, please log into the web site [email or web address].

## 2020-08-04 NOTE — TOC Progression Note (Signed)
Transition of Care [facility]) - Progression Note    Patient Details  Name: DONIQUA SAXBY MRN: [id number] Date of Birth: 1971-07-02  Transition of Care [facility]) CM/SW Contact  Bing Quarry, RN Phone Number: 08/04/2020, 3:13 PM  Clinical Narrative: Admit 11/17.    11/28 Notes indicate possible DC 11/29. Pulm. Sarcoidosis work up. Covid Neg. OT evaluation. HH OT with DME 3:1. No PT yet. Gabriel Cirri RN CM         Expected Discharge Plan and Services                                                 Social Determinants of Health (SDOH) Interventions    Readmission Risk Interventions No flowsheet data found.
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
## 2020-08-04 NOTE — Progress Notes (Signed)
Pt coughing a lot and asked for cough med and also asked for something to help her sleep.  Dr.  Katherina Right notified.  Benadryl 25mg /melatonin 5mg /Robitussin DM given po.

## 2020-08-04 NOTE — Progress Notes (Signed)
Pt resting quietly with eyes closed.  Resp even and unlabored.  Pt appears comfortable and asleep.

## 2020-08-04 NOTE — Progress Notes (Signed)
Pt c/o nausea.  Gingerale and crackers given.  Dr.  Katherina Right notified.  Compazine 10 mg given IVP.

## 2020-08-04 NOTE — Progress Notes (Signed)
*  PRELIMINARY RESULTS* Echocardiogram 2D Echocardiogram has been performed.  Neita Garnet Glynn Yepes 08/04/2020, 2:02 PM

## 2020-08-04 NOTE — Evaluation (Signed)
Physical Therapy Evaluation Patient Details Name: Jennifer [name] MRN: [id number] DOB: 07/08/1971 Today's Date: 08/04/2020   History of Present Illness  Pt is a 49 y/o F who presents to the ED with c/o weakness, fatigue & mild SOB. Pt admitted for tx of sepsis & CAP. PMH: sarcoidosis, tachycardia, asthma, anxiety, depression, fibromyalgia, hyperthyroidism, incontinence, lymphadenopathy, sepsis  Clinical Impression  Pt received sitting EOB with OT. Pt agreeable to co-tx as OT notes pt with significant fatigue. Prior to admission pt was independent, living alone, not using any AD for mobility. Pt currently requires RW for gait (10 ft + 50 ft) with CGA fade to close supervision with pt noting fatigue & need to rest after. Pt reports RW may not fit into her bathroom & PT educates pt on lateral gait with AD with pt return demonstrating with proper use of RW & good safety awareness. Pt requests to lie down at end of session. Pt would benefit from acute PT services to progress gait with LRAD as well as endurance & activity tolerance. Currently recommending d/c home with HHPT with pt reporting she has family that can provide assistance.     Follow Up Recommendations Home health PT;Supervision for mobility/OOB    Equipment Recommendations  Rolling walker with 5" wheels    Recommendations for Other Services       Precautions / Restrictions Precautions Precautions: Fall Precaution Comments: droplet Restrictions Weight Bearing Restrictions: No      Mobility  Bed Mobility Overal bed mobility: Modified Independent             General bed mobility comments: Increased time    Transfers Overall transfer level: Needs assistance Equipment used: Rolling walker (2 wheeled) Transfers: Sit to/from Stand Sit to Stand: Supervision         General transfer comment: ~50 ft mobility CGA + RW improving to SBA + RW - assist for lines mgmt   Ambulation/Gait Ambulation/Gait assistance:  Supervision;Min guard Gait Distance (Feet):  (10 ft + 50 ft) Assistive device: Rolling walker (2 wheeled) Gait Pattern/deviations: Decreased stride length;Decreased step length - right;Decreased step length - left Gait velocity: decreased      Stairs            Wheelchair Mobility    Modified Rankin (Stroke Patients Only)       Balance Overall balance assessment: Needs assistance Sitting-balance support: No upper extremity supported;Feet supported Sitting balance-Leahy Scale: Good     Standing balance support: Bilateral upper extremity supported Standing balance-Leahy Scale: Good Standing balance comment: BUE support on RW during gait                             Pertinent Vitals/Pain Pain Assessment: 0-10 Pain Score: 5  Pain Location: headache Pain Descriptors / Indicators: Headache Pain Intervention(s): Limited activity within patient's tolerance;Monitored during session    Home Living Family/patient expects to be discharged to:: Private residence Living Arrangements: Alone Available Help at Discharge: Family;Available PRN/intermittently Type of Home: Apartment Home Access: Level entry     Home Layout: One level Home Equipment: None      Prior Function Level of Independence: Independent         Comments: Pt reports increased breaks 2/2 poor activity tolerance recently. Typically Independent mobility and I/ADLs     Hand Dominance   Dominant Hand: Right    Extremity/Trunk Assessment   Upper Extremity Assessment Upper Extremity Assessment: Generalized weakness    Lower  Extremity Assessment Lower Extremity Assessment: Generalized weakness       Communication   Communication: No difficulties  Cognition Arousal/Alertness: Awake/alert Behavior During Therapy: WFL for tasks assessed/performed Overall Cognitive Status: Within Functional Limits for tasks assessed                                        General  Comments General comments (skin integrity, edema, etc.): SpO2 97% on room air throughout session    Exercises    Assessment/Plan    PT Assessment Patient needs continued PT services  PT Problem List Decreased strength;Decreased balance;Decreased mobility;Decreased activity tolerance       PT Treatment Interventions DME instruction;Functional mobility training;Balance training;Patient/family education;Neuromuscular re-education;Gait training;Therapeutic activities;Stair training;Therapeutic exercise    PT Goals (Current goals can be found in the Care Plan section)  Acute Rehab PT Goals Patient Stated Goal: To improve breathing PT Goal Formulation: With patient Time For Goal Achievement: 08/18/20 Potential to Achieve Goals: Good    Frequency Min 2X/week   Barriers to discharge Decreased caregiver support lives alone but reports family close by that can assist PRN    Co-evaluation PT/OT/SLP Co-Evaluation/Treatment: Yes Reason for Co-Treatment: To address functional/ADL transfers PT goals addressed during session: Mobility/safety with mobility;Proper use of DME;Balance OT goals addressed during session: ADL's and self-care       AM-PAC PT "6 Clicks" Mobility  Outcome Measure Help needed turning from your back to your side while in a flat bed without using bedrails?: None Help needed moving from lying on your back to sitting on the side of a flat bed without using bedrails?: None Help needed moving to and from a bed to a chair (including a wheelchair)?: A Little Help needed standing up from a chair using your arms (e.g., wheelchair or bedside chair)?: None Help needed to walk in hospital room?: A Little Help needed climbing 3-5 steps with a railing? : A Little 6 Click Score: 21    End of Session   Activity Tolerance: Patient tolerated treatment well;Patient limited by fatigue Patient left: in bed;with bed alarm set;with call bell/phone within reach Nurse Communication:  Mobility status PT Visit Diagnosis: Muscle weakness (generalized) (M62.81);Difficulty in walking, not elsewhere classified (R26.2)    Time: 6283-1517 PT Time Calculation (min) (ACUTE ONLY): 13 min   Charges:   PT Evaluation $PT Eval Low Complexity: 1 Low PT Treatments $Therapeutic Activity: 8-22 mins        Jennifer [name], PT, DPT 08/04/20, 4:22 PM   Jennifer [name] 08/04/2020, 4:20 PM

## 2020-08-04 NOTE — Evaluation (Signed)
Occupational Therapy Evaluation Patient Details Name: Jennifer [name] MRN: [id number] DOB: Feb 05, 1971 Today's Date: 08/04/2020    History of Present Illness Jennifer [name] is a 49 y.o. female with medical history significant for pulmonary sarcoidosis, hyperthyroidism, depression who presents to the ER via EMS for evaluation of weakness and feeling like her heart has been racing.   Clinical Impression   [name] was seen for OT evaluation this date - overlapping c PT for safe mobility (fatgiues quickly, pt reported unable to tolerate 2 sessions this PM). Prior to hospital admission, pt was Independent in I/ADLs and mobility. Pt lives alone in apartment c level entry and family available PRN. Pt presents to acute OT demonstrating impaired ADL performance and functional mobility 2/2 decreased activity tolerance and functional strength deficits. Pt currently MOD I for bed mobility and don B socks seated EOB. Pt tolerated ~50 ft mobility initially requiring CGA + RW improving to SBA + RW. SpO2 97% on RA t/o session. Pt educated in energy conservation strategies including pursed lip breathing, activity pacing, home/routines modifications, work simplification, AE/DME, prioritizing of meaningful occupations, and falls prevention. Pt would benefit from skilled OT to address noted impairments and functional limitations (see below for any additional details) in order to maximize safety and independence while minimizing falls risk and caregiver burden. Upon hospital discharge, recommend HHOT to maximize pt safety and return to functional independence during meaningful occupations of daily life.     Follow Up Recommendations  Home health OT;Supervision - Intermittent    Equipment Recommendations  3 in 1 bedside commode    Recommendations for Other Services       Precautions / Restrictions Precautions Precautions: Fall;Other (comment) (Droplet) Restrictions Weight Bearing Restrictions: No       Mobility Bed Mobility Overal bed mobility: Modified Independent      General bed mobility comments: Increased time    Transfers Overall transfer level: Needs assistance Equipment used: Rolling walker (2 wheeled) Transfers: Sit to/from Stand Sit to Stand: Supervision    General transfer comment: ~50 ft mobility CGA + RW improving to SBA + RW - assist for lines mgmt     Balance Overall balance assessment: Needs assistance Sitting-balance support: No upper extremity supported;Feet supported Sitting balance-Leahy Scale: Good     Standing balance support: Bilateral upper extremity supported Standing balance-Leahy Scale: Good          ADL either performed or assessed with clinical judgement   ADL Overall ADL's : Needs assistance/impaired          General ADL Comments: MOD I don B socks seated EOB. SBA + RW for toilet t/f                   Pertinent Vitals/Pain Pain Assessment: 0-10 Pain Score: 5  Pain Location: headache Pain Descriptors / Indicators: Headache Pain Intervention(s): Limited activity within patient's tolerance;Repositioned     Hand Dominance Right   Extremity/Trunk Assessment Upper Extremity Assessment Upper Extremity Assessment: Generalized weakness   Lower Extremity Assessment Lower Extremity Assessment: Generalized weakness       Communication Communication Communication: No difficulties   Cognition Arousal/Alertness: Awake/alert Behavior During Therapy: WFL for tasks assessed/performed Overall Cognitive Status: Within Functional Limits for tasks assessed          General Comments  SPO2 97% on RA t/o session    Exercises Exercises: Other exercises Other Exercises Other Exercises: Pt educated re: OT role, DME recs, d/c recs, falls prevention, ECS, home/routines modifications Other Exercises:  LBD, UBD, sup<>sit, sit<>stand, sitting/standing balance/tolerance, ~40 ft mobility   Shoulder Instructions      Home Living  Family/patient expects to be discharged to:: Private residence Living Arrangements: Alone Available Help at Discharge: Family;Available PRN/intermittently Type of Home: Apartment Home Access: Level entry     Home Layout: One level     Bathroom Shower/Tub: Chief Strategy Officer: Standard                Prior Functioning/Environment Level of Independence: Independent        Comments: Pt reports increased breaks 2/2 poor activity tolerance recently. Typically Independent mobility and I/ADLs        OT Problem List: Decreased strength;Decreased activity tolerance      OT Treatment/Interventions: Therapeutic exercise;Self-care/ADL training;Energy conservation;DME and/or AE instruction;Cognitive remediation/compensation;Patient/family education;Balance training    OT Goals(Current goals can be found in the care plan section) Acute Rehab OT Goals Patient Stated Goal: To improve breathing OT Goal Formulation: With patient Time For Goal Achievement: 08/18/20 Potential to Achieve Goals: Good ADL Goals Pt Will Perform Grooming: Independently;standing Pt Will Transfer to Toilet: Independently;ambulating;regular height toilet Additional ADL Goal #1: Pt will Independently verbalize plan to implement x3 ECS  OT Frequency: Min 1X/week   Barriers to D/C:            Co-evaluation PT/OT/SLP Co-Evaluation/Treatment: Yes Reason for Co-Treatment: To address functional/ADL transfers PT goals addressed during session: Mobility/safety with mobility;Proper use of DME OT goals addressed during session: ADL's and self-care      AM-PAC OT "6 Clicks" Daily Activity     Outcome Measure Help from another person eating meals?: None Help from another person taking care of personal grooming?: None Help from another person toileting, which includes using toliet, bedpan, or urinal?: A Little Help from another person bathing (including washing, rinsing, drying)?: A Little Help  from another person to put on and taking off regular upper body clothing?: None Help from another person to put on and taking off regular lower body clothing?: A Little 6 Click Score: 21   End of Session Equipment Utilized During Treatment: Rolling walker  Activity Tolerance: Patient tolerated treatment well Patient left: in bed;with call bell/phone within reach;with bed alarm set  OT Visit Diagnosis: Other abnormalities of gait and mobility (R26.89)                Time: 2202-5427 OT Time Calculation (min): 26 min Charges:  OT General Charges $OT Visit: 1 Visit OT Evaluation $OT Eval Low Complexity: 1 Low OT Treatments $Self Care/Home Management : 8-22 mins  Kathie Dike, M.S. OTR/L  08/04/20, 2:11 PM  ascom [phone]

## 2020-08-05 DIAGNOSIS — A419 Sepsis, unspecified organism: Secondary | ICD-10-CM | POA: Diagnosis not present

## 2020-08-05 LAB — CBC WITH DIFFERENTIAL/PLATELET
Abs Immature Granulocytes: 0.08 10*3/uL — ABNORMAL HIGH (ref 0.00–0.07)
Basophils Absolute: 0 10*3/uL (ref 0.0–0.1)
Basophils Relative: 0 %
Eosinophils Absolute: 0 10*3/uL (ref 0.0–0.5)
Eosinophils Relative: 0 %
HCT: 33.4 % — ABNORMAL LOW (ref 36.0–46.0)
Hemoglobin: 10.6 g/dL — ABNORMAL LOW (ref 12.0–15.0)
Immature Granulocytes: 1 %
Lymphocytes Relative: 18 %
Lymphs Abs: 1.9 10*3/uL (ref 0.7–4.0)
MCH: 23.7 pg — ABNORMAL LOW (ref 26.0–34.0)
MCHC: 31.7 g/dL (ref 30.0–36.0)
MCV: 74.6 fL — ABNORMAL LOW (ref 80.0–100.0)
Monocytes Absolute: 0.8 10*3/uL (ref 0.1–1.0)
Monocytes Relative: 7 %
Neutro Abs: 8.2 10*3/uL — ABNORMAL HIGH (ref 1.7–7.7)
Neutrophils Relative %: 74 %
Platelets: 258 10*3/uL (ref 150–400)
RBC: 4.48 MIL/uL (ref 3.87–5.11)
RDW: 12.5 % (ref 11.5–15.5)
WBC: 11 10*3/uL — ABNORMAL HIGH (ref 4.0–10.5)
nRBC: 0 % (ref 0.0–0.2)

## 2020-08-05 LAB — BASIC METABOLIC PANEL
Anion gap: 7 (ref 5–15)
BUN: 14 mg/dL (ref 6–20)
CO2: 23 mmol/L (ref 22–32)
Calcium: 8.5 mg/dL — ABNORMAL LOW (ref 8.9–10.3)
Chloride: 110 mmol/L (ref 98–111)
Creatinine, Ser: 0.48 mg/dL (ref 0.44–1.00)
GFR, Estimated: >60 mL/min (ref >60)
Glucose, Bld: 106 mg/dL — ABNORMAL HIGH (ref 70–99)
Potassium: 3.7 mmol/L (ref 3.5–5.1)
Sodium: 140 mmol/L (ref 135–145)

## 2020-08-05 LAB — ANA COMPREHENSIVE PANEL
Anti JO-1: <0.2 AI (ref 0.0–0.9)
Centromere Ab Screen: <0.2 AI (ref 0.0–0.9)
Chromatin Ab SerPl-aCnc: <0.2 AI (ref 0.0–0.9)
ENA SM Ab Ser-aCnc: <0.2 AI (ref 0.0–0.9)
Ribonucleic Protein: 0.5 AI (ref 0.0–0.9)
SSA (Ro) (ENA) Antibody, IgG: <0.2 AI (ref 0.0–0.9)
SSB (La) (ENA) Antibody, IgG: <0.2 AI (ref 0.0–0.9)
Scleroderma (Scl-70) (ENA) Antibody, IgG: <0.2 AI (ref 0.0–0.9)
ds DNA Ab: <1 IU/mL (ref 0–9)

## 2020-08-05 LAB — TSH: TSH: <0.01 u[IU]/mL — ABNORMAL LOW (ref 0.350–4.500)

## 2020-08-05 LAB — ANGIOTENSIN CONVERTING ENZYME: Angiotensin-Converting Enzyme: 54 U/L (ref 14–82)

## 2020-08-05 MED ORDER — VITAMIN D (CHOLECALCIFEROL) 25 MCG (1000 UT) PO TABS: 500.0000 [IU] | ORAL_TABLET | Freq: Every day | ORAL | Status: AC

## 2020-08-05 MED ORDER — SAPHRIS 5 MG SL SUBL
5.0000 mg | SUBLINGUAL_TABLET | Freq: Every day | SUBLINGUAL | 0 refills | Status: DC
Start: 2020-08-05 — End: 2023-11-02

## 2020-08-05 MED ORDER — LACTULOSE 10 GM/15ML PO SOLN
10.0000 g | Freq: Two times a day (BID) | ORAL | Status: DC
Start: 2020-08-05 — End: 2021-08-21

## 2020-08-05 MED ORDER — POLYETHYLENE GLYCOL 3350 17 GM/SCOOP PO POWD: 17.0000 g | ORAL | 0 refills | Status: AC | PRN

## 2020-08-05 MED ORDER — PREDNISONE 10 MG PO TABS: 40.0000 mg | ORAL_TABLET | Freq: Every day | ORAL | 0 refills | Status: AC

## 2020-08-05 NOTE — Discharge Instructions (Signed)
Respiratory Syncytial Virus, Adult Respiratory syncytial virus (RSV) infection is an infection caused by RSV, a common virus. This virus is similar to viruses that cause the common cold and the flu. RSV infection can affect the nose, throat, windpipe, and lungs (respiratory system). When the infection is severe, it can cause:  Bronchiolitis. This condition causes inflammation of the air passages in the lungs (bronchioles).  Pneumonia. This condition causes inflammation of the air sacs in the lungs. RSV infection spreads from person to person (is contagious) through droplets from coughs and sneezes (respiratory secretions). This condition is rarely serious when it occurs in adults. What are the causes? This condition is caused by contact with the RSV. This can happen by:  Breathing respiratory secretions that fly through the air.  Touching something that has been exposed to the virus (is contaminated) and then touching your mouth, nose, or eyes.  Coming in close contact with someone who has this infection. This may happen if you: ? Hug or kiss. ? Shake or hold hands. ? Eat or drink using the same dishes or utensils. What increases the risk? The following factors may make you more likely to develop this condition:  Being 65 years of age or older.  Having certain health conditions, including: ? A long-term (chronic) lung condition, such as chronic obstructive pulmonary disease (COPD). ? An immune system that is weak. This is your body's defense system. ? Down syndrome. ? Heart disease.  Working in a hospital or other health care facility.  Living in a long-term health care facility. RVS infections are most common from the months of November to April. But they can happen any time of year. What are the signs or symptoms? Symptoms of this condition include:  Having a runny nose.  Coughing. You may have a cough that brings up mucus (productive cough).  Sneezing.  Having a  fever.  Wanting to eat less than usual.  Breathing loudly (wheezing).  Having shortness of breath.  Having fluid build up in the lungs (respiratory distress). How is this diagnosed? This condition may be diagnosed based on:  Your symptoms.  Your medical history.  A physical exam.  A chest X-ray to rule out pneumonia.  Blood tests or tests of mucus from your lungs (sputum). These tests may be done for older adults.  A test of a sample of your respiratory secretions. How is this treated? In most cases, the RSV infection will go away after 1-2 weeks of caring for yourself at home.  Sometimes, RSV infection is severe and causes pneumonia. If you develop pneumonia, you may need to be treated in the hospital. You may be given:  Oxygen therapy.  Antibiotic medicine.  Medicines to open your bronchioles (bronchodilators). Follow these instructions at home: Medicines  Take over-the-counter and prescription medicines only as told by your health care provider.  If you were prescribed an antibiotic medicine, take it as told by your health care provider. Do not stop using the antibiotic even if you start to feel better. Lifestyle  Eat a healthy diet.  Do not drink alcohol.  Do not use any products that contain nicotine or tobacco, such as cigarettes, e-cigarettes, and chewing tobacco. If you need help quitting, ask your health care provider.  Rest at home until your symptoms go away.  Return to your normal activities as told by your health care provider. Ask your health care provider what activities are safe for you. General instructions  Drink enough fluid to keep your   urine pale yellow.  Gargle with a salt-water mixture 3-4 times a day or as needed. To make a salt-water mixture, completely dissolve -1 tsp (3-6 g) of salt in 1 cup (237 mL) of warm water.  Keep all follow-up visits as told by your health care provider. This is important. How is this prevented? To prevent  catching and spreading RSV:  Wash your hands often with soap and water. If soap and water are not available, use hand sanitizer. This liquid kills germs. Do not touch your face without first cleaning your hands.  Stay home if you have symptoms of the common cold or the flu.  Cover your nose and mouth when you cough or sneeze.  Avoid large groups of people.  Keep a safe distance from people who are coughing or sneezing. Contact a health care provider if:  Your symptoms get worse or have not changed after 2 weeks.  You have: ? A fever. ? Hot flashes, sweating, or chills that keep happening. ? A cough that brings up much more mucus than usual. ? A cough that brings up blood. ? Respiratory distress that gets worse.  You feel: ? Very tired (lethargic). ? Confused. Get help right away if you have:  Respiratory distress that becomes severe.  Loss of consciousness. These symptoms may represent a serious problem that is an emergency. Do not wait to see if the symptoms will go away. Get medical help right away. Call your local emergency services (911 in the [place] Do not drive yourself to the hospital. This information is not intended to replace advice given to you by your health care provider. Make sure you discuss any questions you have with your health care provider. Document Revised: 08/26/2018 Document Reviewed: 02/04/2016 [employer] Patient Education  2020 [employer]  
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
## 2020-08-05 NOTE — Discharge Summary (Signed)
Physician Discharge Summary  ANEYAH LORTZ WUJ:[id number] DOB: Apr 29, 1971 DOA: 08/03/2020  PCP: Judeen Hammans, MD  Admit date: 08/03/2020 Discharge date: 08/05/2020  Admitted From: Home Disposition: Home  Recommendations for Outpatient Follow-up:  1. Follow up with PCP in 1-2 weeks 2. Follow-up with pulmonology in 1 to 2 weeks as outpatient 3. Please obtain BMP/CBC in one week 4. Please follow up with your PCP on the following pending results: Unresulted Labs (From admission, onward)          Start     Ordered   08/05/20 0842  TSH  Once,   R       Question:  Specimen collection method  Answer:  Unit=Unit collect   08/05/20 0841   08/05/20 0500  Basic metabolic panel  Tomorrow morning,   R       Question:  Specimen collection method  Answer:  Unit=Unit collect   08/04/20 1420   08/04/20 1101  IgE  Once,   R       Question:  Specimen collection method  Answer:  Unit=Unit collect   08/04/20 1100   08/04/20 1101  Angiotensin converting enzyme  Once,   R       Question:  Specimen collection method  Answer:  Unit=Unit collect   08/04/20 1100   08/04/20 1100  ANA Comprehensive Panel  Once,   R       Question:  Specimen collection method  Answer:  Unit=Unit collect   08/04/20 1100   08/04/20 1036  Acid Fast Smear (AFB)  (AFB smear + Culture w reflexed sensitivities panel)  ONCE - STAT,   STAT       Question:  Patient immune status  Answer:  Normal  "And" Linked Group Details   08/04/20 1035   08/04/20 1036  Acid Fast Culture with reflexed sensitivities  (AFB smear + Culture w reflexed sensitivities panel)  ONCE - STAT,   STAT       Question:  Patient immune status  Answer:  Normal  "And" Linked Group Details   08/04/20 1035   08/04/20 1036  Fungitell, Serum  ONCE - STAT,   STAT       Question:  Specimen collection method  Answer:  Unit=Unit collect   08/04/20 1035   08/04/20 1035  Expectorated sputum assessment w rflx to resp cult  ONCE - STAT,   STAT       Question:   Patient immune status  Answer:  Normal   08/04/20 1035   08/04/20 1035  Expectorated sputum assessment w rflx to resp cult  ONCE - STAT,   STAT       Question:  Patient immune status  Answer:  Normal   08/04/20 1035   08/04/20 1034  Culture, fungus without smear  ONCE - STAT,   STAT       Question:  Patient immune status  Answer:  Normal   08/04/20 1035   08/04/20 1034  KOH prep  ONCE - STAT,   STAT       Question:  Patient immune status  Answer:  Normal   08/04/20 1035   08/04/20 1033  Histoplasma antigen, urine  Once,   R        08/04/20 1035   08/04/20 1033  Acid Fast Smear (AFB)  (AFB smear + Culture w reflexed sensitivities panel)  ONCE - STAT,   STAT       Question:  Patient immune status  Answer:  Normal  "  And" Linked Group Details   08/04/20 1035   08/04/20 1033  Acid Fast Culture with reflexed sensitivities  (AFB smear + Culture w reflexed sensitivities panel)  ONCE - STAT,   STAT       Question:  Patient immune status  Answer:  Normal  "And" Linked Group Details   08/04/20 1035   08/04/20 1033  Aspergillus Ag, BAL/Serum  Once,   R       Question:  Patient immune status  Answer:  Normal   08/04/20 1035   08/03/20 1408  Thyroid Panel With TSH  Once,   STAT       Question:  Specimen collection method  Answer:  Unit=Unit collect   08/03/20 1407           Home Health: Yes Equipment/Devices: Walker and commode  Discharge Condition: Stable CODE STATUS: Full code Diet recommendation: Cardiac  Subjective: Seen and examined.  Feels much better.  No shortness of breath but some cough.  No other complaint.  ZOX:WRUEA B Weathersis a 49 y.o.femalewith medical history significant forpulmonary sarcoidosis, hyperthyroidism, depression who presents to the ER via EMS for evaluation of weakness and feeling like her heart has been racing. She has a cough productive of green phlegm but denies having any fever or chills. She denies having any sick contacts.Per EMS her heart rate  was in the 150s when they arrived. Patient is up-to-date with her complete vaccination. She complains of feeling very weak and has had episodes of nausea, vomiting and diffuse abdominal pain. She is unable to tell me when her last bowel movement was states that she has issues with constipation. She denies having urinary frequency, nocturia, dysuria, no dizziness, no lightheadedness, no headache She was seen in the emergency room 5 days prior to her hospitalization and treated for an acute asthma exacerbation and discharged home on a steroid taper without any significant improvement in her symptoms. Labs show sodium 134, potassium 4.0, chloride 101, bicarb 22, BUN 6, creatinine 0.61, calcium 8.8, alkaline phosphatase 94, albumin 3.3, AST 15, ALT 13 lactic acid 2.5>> 1.0,white count 10.5, hemoglobin 13.7, hematocrit 42.0, MCV 70.6, RDW 12.1, platelet count 285, PT 13.2, INR 1.0 TSH< 0.010 Respiratory viral panel is negative CT angiogram of the chest shows no evidence of pulmonary embolus. Chronic bilateral cylindrical bronchiectasis, upper lobe volume loss and scarring. Scattered peribronchial and subpleural areas of ground-glass airspace opacities and macro nodularity, improved overall when compared to 2017. These findings may represent atypical/viral pneumonia, or sequela of sarcoidosis. Persistent cavitary lesion in the left upper lobe containing intramural soft tissue mass. The area of cavitation is similar to 2017 study with slight enlargement of the intramural soft tissue mass. Differential diagnosis includes cavitary lesion containing mycetoma, versus cavitary pneumonia. Given the minimal progression from 2017 malignancy is unlikely. Twelve-lead EKG reviewed by me shows shows sinus tachycardia   ED Course:Patient is a 49 year old African-American female with a history significant for pulmonary sarcoidosis and hyperthyroidism who presents to the emergency room via EMS for evaluation of  palpitations and weakness.Per EMS patient had a heart rate of 150bpm in the field.Her initial lactate was elevated at 2.5 and CT angiogram of the chest done to rule out PE shows cylindrical bronchiectasis.Patient's TSH is < 0.010consistent with noncompliance with her methimazole.She will be admitted to the hospital for further evaluation and treatment.  Brief/Interim Summary: Patient was initially admitted due to presumed sepsis secondary to community-acquired pneumonia based on the CT chest findings.  She was started  on broad-spectrum IV antibiotics.  Patient never required any oxygen.  She was tested negative for COVID-19 as well as influenza.  She was seen by pulmonology.  She was also started on steroids.  Antibiotics were continued and subsequently respiratory viral panel indicated that she was positive for RSV virus.  Discussed case with pulmonologist Dr. Karna Christmas and we both believe that patient symptoms are likely secondary to RSV viral pneumonia not bacterial pneumonia and thus no antibiotics are indicated.  Patient is feeling much better and wants to go home.  She is being discharged in stable condition.  PT OT has been arranged for her.  Of note, there was also a concern of possible thyrotoxicosis when patient came in due to having tachycardia and recent labs with significantly low TSH about a week ago.  Repeat TSH is still pending.  Patient's tachycardia resolved after IV fluids.  I doubt she has thyrotoxicosis.  Resume home dose of methimazole.  Discharge Diagnoses:  Principal Problem:   Sepsis [facility]) Active Problems:   Sarcoidosis   Thyrotoxicosis   Depression    Discharge Instructions  Discharge Instructions    Discharge patient   Complete by: As directed    Discharge disposition: 01-Home or Self Care   Discharge patient date: 08/05/2020     Allergies as of 08/05/2020      Reactions   Penicillin G Hives      Medication List    TAKE these medications    acetaminophen 500 MG tablet Commonly known as: TYLENOL Take 500-1,000 mg by mouth every 6 (six) hours as needed for mild pain or fever.   Advair Diskus 250-50 MCG/DOSE Aepb Generic drug: Fluticasone-Salmeterol Inhale 1 puff into the lungs 2 (two) times daily.   asenapine 5 MG Subl 24 hr tablet Commonly known as: SAPHRIS Place 5 mg under the tongue daily. What changed: Another medication with the same name was changed. Make sure you understand how and when to take each.   Saphris 5 MG Subl 24 hr tablet Generic drug: asenapine Place 1 tablet (5 mg total) under the tongue daily. What changed: how to take this   divalproex 500 MG 24 hr tablet Commonly known as: DEPAKOTE ER Take 500 mg by mouth daily.   ibuprofen 200 MG tablet Commonly known as: ADVIL Take 400-600 mg by mouth every 6 (six) hours as needed for fever or mild pain.   lactulose 10 GM/15ML solution Commonly known as: CHRONULAC Take 15 mLs (10 g total) by mouth 2 (two) times daily.   methimazole 10 MG tablet Commonly known as: TAPAZOLE Take 30 mg by mouth daily.   metoprolol tartrate 100 MG tablet Commonly known as: LOPRESSOR Take 1 tablet (100 mg total) by mouth 2 (two) times daily.   omeprazole 40 MG capsule Commonly known as: PRILOSEC Take 1 capsule (40 mg total) by mouth daily.   polyethylene glycol powder 17 GM/SCOOP powder Commonly known as: GLYCOLAX/MIRALAX Take 17 g by mouth as needed.   predniSONE 10 MG tablet Commonly known as: DELTASONE Take 4 tablets (40 mg total) by mouth daily for 5 days.   ProAir HFA 108 (90 Base) MCG/ACT inhaler Generic drug: albuterol Inhale 1 puff into the lungs every 6 (six) hours as needed.   Vitamin D (Cholecalciferol) 25 MCG (1000 UT) Tabs Take 500 Units by mouth daily.       Follow-up Information    Soles, Willaim Rayas, MD Follow up in 1 week(s).   Specialty: Family Medicine Contact information: [address] [place]  [address] [phone]        Yvonne Kendall, MD .   Specialty: Cardiology Contact information: [address] [place] [address] [phone]              Allergies  Allergen Reactions  . Penicillin G Hives    Consultations: Pulmonology   Procedures/Studies: DG Chest 2 View  Result Date: 07/29/2020 CLINICAL DATA:  Bilateral arm tingling. EXAM: CHEST - 2 VIEW COMPARISON:  November 28, 2015 FINDINGS: There is stable architectural distortion in the bilateral upper lobes, left worse than right. There are chronic airspace opacities bilaterally, similar to prior study. The heart size is essentially stable. There is no pneumothorax. No large pleural effusion. IMPRESSION: No active cardiopulmonary disease. Electronically Signed   By: Katherine Mantle M.D.   On: 07/29/2020 01:41   CT Head Wo Contrast  Result Date: 07/29/2020 CLINICAL DATA:  Tingling in both arms starting tonight. EXAM: CT HEAD WITHOUT CONTRAST TECHNIQUE: Contiguous axial images were obtained from the base of the skull through the vertex without intravenous contrast. COMPARISON:  02/26/2016 FINDINGS: Brain: No evidence of acute infarction, hemorrhage, hydrocephalus, extra-axial collection or mass lesion/mass effect. Vascular: No hyperdense vessel or unexpected calcification. Skull: Normal. Negative for fracture or focal lesion. Sinuses/Orbits: No acute finding. Other: None. IMPRESSION: No acute intracranial abnormalities. Electronically Signed   By: Burman Nieves M.D.   On: 07/29/2020 01:20   CT Angio Chest PE W and/or Wo Contrast  Result Date: 08/03/2020 CLINICAL DATA:  Productive cough. Increased heart rate. Weakness. EXAM: CT ANGIOGRAPHY CHEST WITH CONTRAST TECHNIQUE: Multidetector CT imaging of the chest was performed using the standard protocol during bolus administration of intravenous contrast. Multiplanar CT image reconstructions and MIPs were obtained to evaluate the vascular anatomy.  CONTRAST:  75mL OMNIPAQUE IOHEXOL 350 MG/ML SOLN COMPARISON:  March 20, 2016 FINDINGS: Cardiovascular: Satisfactory opacification of the pulmonary arteries to the segmental level. No evidence of pulmonary embolism. Normal heart size. No pericardial effusion. Mediastinum/Nodes: No enlarged mediastinal, hilar, or axillary lymph nodes. Thyroid gland, trachea, and esophagus demonstrate no significant findings. Lungs/Pleura: Chronic bilateral cylindrical bronchiectasis, upper lobe volume loss and scarring. Again seen is cavitary lesion in the left upper lobe containing intramural soft tissue mass. The area of cavitation is similar to 2017 study with slight enlargement of the intramural soft tissue mass. Scattered peribronchial and subpleural areas of ground-glass airspace opacities and macro nodularity, improved overall when compared to 2017. Upper Abdomen: No acute abnormality. Musculoskeletal: No chest wall abnormality. No acute or significant osseous findings. Review of the MIP images confirms the above findings. IMPRESSION: 1. No evidence of pulmonary embolus. 2. Chronic bilateral cylindrical bronchiectasis, upper lobe volume loss and scarring. 3. Scattered peribronchial and subpleural areas of ground-glass airspace opacities and macro nodularity, improved overall when compared to 2017. These findings may represent atypical/viral pneumonia, or sequela of sarcoidosis. 4. Persistent cavitary lesion in the left upper lobe containing intramural soft tissue mass. The area of cavitation is similar to 2017 study with slight enlargement of the intramural soft tissue mass. Differential diagnosis includes cavitary lesion containing mycetoma, versus cavitary pneumonia. Given the minimal progression from 2017 malignancy is unlikely. Electronically Signed   By: Ted Mcalpine M.D.   On: 08/03/2020 12:16   DG Chest Portable 1 View  Result Date: 08/03/2020 CLINICAL DATA:  Shortness of breath, weakness and tachycardia for 2  weeks. EXAM: PORTABLE CHEST 1 VIEW COMPARISON:  07/29/2020 FINDINGS: Heart size appears within normal  limits. No pleural effusion or interstitial edema. Chronic, bilateral reticular interstitial opacities with upper lobe areas of scarring and architectural distortion. No superimposed airspace consolidation identified. IMPRESSION: 1. Stable appearance of chronic interstitial changes compatible with the history of sarcoid. No superimposed acute airspace consolidation noted. Electronically Signed   By: Signa Kell M.D.   On: 08/03/2020 09:36   DG Abd 2 Views  Result Date: 08/03/2020 CLINICAL DATA:  Abdominal pain. EXAM: ABDOMEN - 2 VIEW COMPARISON:  None. FINDINGS: The bowel gas pattern is normal. There is no evidence of free air. No radio-opaque calculi or other significant radiographic abnormality is seen. IMPRESSION: Negative. Electronically Signed   By: Ted Mcalpine M.D.   On: 08/03/2020 14:47   ECHOCARDIOGRAM COMPLETE  Result Date: 08/04/2020    ECHOCARDIOGRAM REPORT   Patient Name:   LATRINA GUTTMAN Date of Exam: 08/04/2020 Medical Rec #:  [id number]        Height:       57.0 in Accession #:    [id number]       Weight:       109.5 lb Date of Birth:  1971-03-27         BSA:          1.392 m Patient Age:    49 years         BP:           136/61 mmHg Patient Gender: F                HR:           98 bpm. Exam Location:  [facility] Procedure: 2D Echo, Cardiac Doppler and Color Doppler Indications:     Chest Pain 786.50 / R07.9  History:         Patient has prior history of Echocardiogram examinations.                  Arrythmias:PVC; Signs/Symptoms:Shortness of Breath.  Sonographer:     Neysa Bonito Roar Referring Phys:  [id number] FWYOVZCH AGBATA Diagnosing Phys: Julien Nordmann MD IMPRESSIONS  1. Left ventricular ejection fraction, by estimation, is 60 to 65%. The left ventricle has normal function. The left ventricle has no regional wall motion abnormalities. Left ventricular diastolic parameters are consistent  with Grade I diastolic dysfunction (impaired relaxation).  2. Right ventricular systolic function is normal. The right ventricular size is normal.  3. The mitral valve is normal in structure. Mild mitral valve regurgitation. FINDINGS  Left Ventricle: Left ventricular ejection fraction, by estimation, is 60 to 65%. The left ventricle has normal function. The left ventricle has no regional wall motion abnormalities. The left ventricular internal cavity size was normal in size. There is  no left ventricular hypertrophy. Left ventricular diastolic parameters are consistent with Grade I diastolic dysfunction (impaired relaxation). Right Ventricle: The right ventricular size is normal. No increase in right ventricular wall thickness. Right ventricular systolic function is normal. Left Atrium: Left atrial size was normal in size. Right Atrium: Right atrial size was normal in size. Pericardium: There is no evidence of pericardial effusion. Mitral Valve: The mitral valve is normal in structure. Mild mitral valve regurgitation. No evidence of mitral valve stenosis. Tricuspid Valve: The tricuspid valve is normal in structure. Tricuspid valve regurgitation is mild . No evidence of tricuspid stenosis. Aortic Valve: The aortic valve is normal in structure. Aortic valve regurgitation is not visualized. No aortic stenosis is present. Aortic valve peak gradient measures 9.9 mmHg. Pulmonic Valve: The pulmonic valve  was normal in structure. Pulmonic valve regurgitation is not visualized. No evidence of pulmonic stenosis. Aorta: The aortic root is normal in size and structure. Venous: The inferior vena cava is normal in size with greater than 50% respiratory variability, suggesting right atrial pressure of 3 mmHg. IAS/Shunts: No atrial level shunt detected by color flow Doppler.  LEFT VENTRICLE PLAX 2D LVIDd:         4.33 cm  Diastology LVIDs:         3.23 cm  LV e' medial:    10.00 cm/s LV PW:         0.77 cm  LV E/e' medial:  11.0 LV  IVS:        0.63 cm  LV e' lateral:   10.90 cm/s LVOT diam:     1.80 cm  LV E/e' lateral: 10.1 LVOT Area:     2.54 cm  RIGHT VENTRICLE RV Mid diam:    2.44 cm RV S prime:     17.10 cm/s TAPSE (M-mode): 2.8 cm LEFT ATRIUM             Index       RIGHT ATRIUM          Index LA diam:        3.35 cm 2.41 cm/m  RA Area:     9.17 cm LA Vol (A2C):   38.0 ml 27.29 ml/m RA Volume:   16.80 ml 12.07 ml/m LA Vol (A4C):   39.2 ml 28.15 ml/m LA Biplane Vol: 42.0 ml 30.16 ml/m  AORTIC VALVE                PULMONIC VALVE AV Area (Vmax): 1.62 cm    PV Vmax:        1.09 m/s AV Vmax:        157.00 cm/s PV Peak grad:   4.8 mmHg AV Peak Grad:   9.9 mmHg    RVOT Peak grad: 3 mmHg LVOT Vmax:      99.80 cm/s  AORTA Ao Root diam: 2.60 cm MITRAL VALVE MV Area (PHT): 5.54 cm     SHUNTS MV Decel Time: 137 msec     Systemic Diam: 1.80 cm MV E velocity: 110.00 cm/s MV A velocity: 125.00 cm/s MV E/A ratio:  0.88 MV A Prime:    13.1 cm/s Julien Nordmann MD Electronically signed by Julien Nordmann MD Signature Date/Time: 08/04/2020/2:26:26 PM    Final       Discharge Exam: Vitals:   08/05/20 0546 08/05/20 0813  BP: 139/72 134/78  Pulse: 81 82  Resp: 17 17  Temp: 97.6 F (36.4 C) 97.8 F (36.6 C)  SpO2: 97% 97%   Vitals:   08/04/20 1701 08/04/20 1939 08/05/20 0546 08/05/20 0813  BP: (!) 154/89 132/60 139/72 134/78  Pulse: 93 (!) 105 81 82  Resp: 17 20 17 17   Temp: 97.6 F (36.4 C) 97.8 F (36.6 C) 97.6 F (36.4 C) 97.8 F (36.6 C)  TempSrc: Oral Axillary Oral Oral  SpO2: 98% 98% 97% 97%  Weight:   52.2 kg   Height:        General: Pt is alert, awake, not in acute distress Cardiovascular: RRR, S1/S2 +, no rubs, no gallops Respiratory: CTA bilaterally, no wheezing, no rhonchi Abdominal: Soft, NT, ND, bowel sounds + Extremities: no edema, no cyanosis    The results of significant diagnostics from this hospitalization (including imaging, microbiology, ancillary and laboratory) are listed below for  reference.  Microbiology: Recent Results (from the past 240 hour(s))  Resp Panel by RT-PCR (Flu A&B, Covid) Nasopharyngeal Swab     Status: None   Collection Time: 07/29/20  2:04 AM   Specimen: Nasopharyngeal Swab; Nasopharyngeal(NP) swabs in vial transport medium  Result Value Ref Range Status   SARS Coronavirus 2 by RT PCR NEGATIVE NEGATIVE Final    Comment: (NOTE) SARS-CoV-2 target nucleic acids are NOT DETECTED.  The SARS-CoV-2 RNA is generally detectable in upper respiratory specimens during the acute phase of infection. The lowest concentration of SARS-CoV-2 viral copies this assay can detect is 138 copies/mL. A negative result does not preclude SARS-Cov-2 infection and should not be used as the sole basis for treatment or other patient management decisions. A negative result may occur with  improper specimen collection/handling, submission of specimen other than nasopharyngeal swab, presence of viral mutation(s) within the areas targeted by this assay, and inadequate number of viral copies(<138 copies/mL). A negative result must be combined with clinical observations, patient history, and epidemiological information. The expected result is Negative.  Fact Sheet for Patients:  BloggerCourse.com  Fact Sheet for Healthcare Providers:  SeriousBroker.it  This test is no t yet approved or cleared by the [place] FDA and  has been authorized for detection and/or diagnosis of SARS-CoV-2 by FDA under an Emergency Use Authorization (EUA). This EUA will remain  in effect (meaning this test can be used) for the duration of the COVID-19 declaration under Section 564(b)(1) of the Act, 21 U.S.C.section 360bbb-3(b)(1), unless the authorization is terminated  or revoked sooner.       Influenza A by PCR NEGATIVE NEGATIVE Final   Influenza B by PCR NEGATIVE NEGATIVE Final    Comment: (NOTE) The Xpert Xpress SARS-CoV-2/FLU/RSV  plus assay is intended as an aid in the diagnosis of influenza from Nasopharyngeal swab specimens and should not be used as a sole basis for treatment. Nasal washings and aspirates are unacceptable for Xpert Xpress SARS-CoV-2/FLU/RSV testing.  Fact Sheet for Patients: BloggerCourse.com  Fact Sheet for Healthcare Providers: SeriousBroker.it  This test is not yet approved or cleared by the [place] FDA and has been authorized for detection and/or diagnosis of SARS-CoV-2 by FDA under an Emergency Use Authorization (EUA). This EUA will remain in effect (meaning this test can be used) for the duration of the COVID-19 declaration under Section 564(b)(1) of the Act, 21 U.S.C. section 360bbb-3(b)(1), unless the authorization is terminated or revoked.  Performed at [facility], [address] [place], [place] [address]   Resp Panel by RT-PCR (Flu A&B, Covid) Nasopharyngeal Swab     Status: None   Collection Time: 08/03/20  8:57 AM   Specimen: Nasopharyngeal Swab; Nasopharyngeal(NP) swabs in vial transport medium  Result Value Ref Range Status   SARS Coronavirus 2 by RT PCR NEGATIVE NEGATIVE Final    Comment: (NOTE) SARS-CoV-2 target nucleic acids are NOT DETECTED.  The SARS-CoV-2 RNA is generally detectable in upper respiratory specimens during the acute phase of infection. The lowest concentration of SARS-CoV-2 viral copies this assay can detect is 138 copies/mL. A negative result does not preclude SARS-Cov-2 infection and should not be used as the sole basis for treatment or other patient management decisions. A negative result may occur with  improper specimen collection/handling, submission of specimen other than nasopharyngeal swab, presence of viral mutation(s) within the areas targeted by this assay, and inadequate number of viral copies(<138 copies/mL). A negative result must be combined with clinical  observations, patient history, and  epidemiological information. The expected result is Negative.  Fact Sheet for Patients:  BloggerCourse.com  Fact Sheet for Healthcare Providers:  SeriousBroker.it  This test is no t yet approved or cleared by the [place] FDA and  has been authorized for detection and/or diagnosis of SARS-CoV-2 by FDA under an Emergency Use Authorization (EUA). This EUA will remain  in effect (meaning this test can be used) for the duration of the COVID-19 declaration under Section 564(b)(1) of the Act, 21 U.S.C.section 360bbb-3(b)(1), unless the authorization is terminated  or revoked sooner.       Influenza A by PCR NEGATIVE NEGATIVE Final   Influenza B by PCR NEGATIVE NEGATIVE Final    Comment: (NOTE) The Xpert Xpress SARS-CoV-2/FLU/RSV plus assay is intended as an aid in the diagnosis of influenza from Nasopharyngeal swab specimens and should not be used as a sole basis for treatment. Nasal washings and aspirates are unacceptable for Xpert Xpress SARS-CoV-2/FLU/RSV testing.  Fact Sheet for Patients: BloggerCourse.com  Fact Sheet for Healthcare Providers: SeriousBroker.it  This test is not yet approved or cleared by the [place] FDA and has been authorized for detection and/or diagnosis of SARS-CoV-2 by FDA under an Emergency Use Authorization (EUA). This EUA will remain in effect (meaning this test can be used) for the duration of the COVID-19 declaration under Section 564(b)(1) of the Act, 21 U.S.C. section 360bbb-3(b)(1), unless the authorization is terminated or revoked.  Performed at [facility], [address] [place], [place] [address]   Blood culture (routine single)     Status: None (Preliminary result)   Collection Time: 08/03/20  9:35 AM   Specimen: BLOOD  Result Value Ref Range Status   Specimen Description BLOOD LEFT  HAND  Final   Special Requests   Final    BOTTLES DRAWN AEROBIC AND ANAEROBIC Blood Culture adequate volume   Culture   Final    NO GROWTH 2 DAYS Performed at [facility], [address] [place], [place] [address]    Report Status PENDING  Incomplete  Urine culture     Status: Abnormal   Collection Time: 08/03/20 10:06 AM   Specimen: Urine, Random  Result Value Ref Range Status   Specimen Description   Final    URINE, RANDOM Performed at [facility], [address], [place], [place] [address]    Special Requests   Final    NONE Performed at [facility], [address] [place], [place] [address]    Culture (A)  Final    <10,000 COLONIES/mL INSIGNIFICANT GROWTH Performed at [facility], [address] [place], [place] [address]    Report Status 08/04/2020 FINAL  Final  Respiratory Panel by PCR     Status: Abnormal   Collection Time: 08/04/20  9:48 AM   Specimen: Nasopharyngeal Swab; Respiratory  Result Value Ref Range Status   Adenovirus NOT DETECTED NOT DETECTED Final   Coronavirus 229E NOT DETECTED NOT DETECTED Final    Comment: (NOTE) The Coronavirus on the Respiratory Panel, DOES NOT test for the novel  Coronavirus (2019 nCoV)    Coronavirus HKU1 NOT DETECTED NOT DETECTED Final   Coronavirus NL63 NOT DETECTED NOT DETECTED Final   Coronavirus OC43 NOT DETECTED NOT DETECTED Final   Metapneumovirus NOT DETECTED NOT DETECTED Final   Rhinovirus / Enterovirus NOT DETECTED NOT DETECTED Final   Influenza A NOT DETECTED NOT DETECTED Final   Influenza B NOT DETECTED NOT DETECTED Final   Parainfluenza Virus 1  NOT DETECTED NOT DETECTED Final   Parainfluenza Virus 2 NOT DETECTED NOT DETECTED Final   Parainfluenza Virus 3 NOT DETECTED NOT DETECTED Final   Parainfluenza Virus 4 NOT DETECTED NOT DETECTED Final   Respiratory Syncytial Virus DETECTED (A) NOT DETECTED Final   Bordetella pertussis NOT DETECTED NOT DETECTED Final   Chlamydophila  pneumoniae NOT DETECTED NOT DETECTED Final   Mycoplasma pneumoniae NOT DETECTED NOT DETECTED Final    Comment: Performed at [facility], [address] [place], [place] [address]  Expectorated sputum assessment w rflx to resp cult     Status: None   Collection Time: 08/04/20 10:34 AM   Specimen: Sputum  Result Value Ref Range Status   Specimen Description SPUTUM  Final   Special Requests SPUTUM  Final   Sputum evaluation   Final    THIS SPECIMEN IS ACCEPTABLE FOR SPUTUM CULTURE Performed at [facility], [address], [place], [place] [address]    Report Status 08/04/2020 FINAL  Final  Culture, respiratory     Status: None (Preliminary result)   Collection Time: 08/04/20 10:34 AM   Specimen: SPU  Result Value Ref Range Status   Specimen Description   Final    SPUTUM Performed at [facility], [address], [place], [place] [address]    Special Requests   Final    SPUTUM Reflexed from [id number] Performed at [facility], [address] [place], [place] [address]    Gram Stain   Final    RARE WBC PRESENT, PREDOMINANTLY PMN RARE GRAM VARIABLE ROD    Culture   Final    CULTURE REINCUBATED FOR BETTER GROWTH Performed at [facility], [address] [place], [place] [address]    Report Status PENDING  Incomplete     Labs: BNP (last 3 results) No results for input(s): BNP in the last 8760 hours. Basic Metabolic Panel: Recent Labs  Lab 08/03/20 0857 08/04/20 0511  NA 134* 137  K 4.0 4.3  CL 101 107  CO2 22 21*  GLUCOSE 198* 169*  BUN 6 8  CREATININE 0.60 0.52  CALCIUM 8.8* 8.8*   Liver Function Tests: Recent Labs  Lab 08/03/20 0857  AST 15  ALT 13  ALKPHOS 94  BILITOT 0.6  PROT 7.1  ALBUMIN 3.3*   No results for input(s): LIPASE, AMYLASE in the last 168 hours. No results for input(s): AMMONIA in the last 168 hours. CBC: Recent Labs  Lab 08/03/20 0857 08/03/20 1435 08/04/20 0511 08/05/20 0858  WBC 10.5  10.1 9.7 11.0*  NEUTROABS 8.8* 8.8*  --  8.2*  HGB 13.7 12.5 11.7* 10.6*  HCT 42.0 39.2 36.4 33.4*  MCV 73.6* 73.7* 74.3* 74.6*  PLT 285 264 252 258   Cardiac Enzymes: No results for input(s): CKTOTAL, CKMB, CKMBINDEX, TROPONINI in the last 168 hours. BNP: Invalid input(s): POCBNP CBG: No results for input(s): GLUCAP in the last 168 hours. D-Dimer No results for input(s): DDIMER in the last 72 hours. Hgb A1c No results for input(s): HGBA1C in the last 72 hours. Lipid Profile No results for input(s): CHOL, HDL, LDLCALC, TRIG, CHOLHDL, LDLDIRECT in the last 72 hours. Thyroid function studies No results for input(s): TSH, T4TOTAL, T3FREE, THYROIDAB in the last 72 hours.  Invalid input(s): FREET3 Anemia work up No results for input(s): VITAMINB12, FOLATE, FERRITIN, TIBC, IRON, RETICCTPCT in the last 72 hours. Urinalysis    Component Value Date/Time   COLORURINE STRAW (A) 08/03/2020 1006  APPEARANCEUR CLEAR (A) 08/03/2020 1006   APPEARANCEUR CLEAR 11/28/2014 0338   LABSPEC 1.003 (L) 08/03/2020 1006   LABSPEC 1.005 11/28/2014 0338   PHURINE 7.0 08/03/2020 1006   GLUCOSEU 50 (A) 08/03/2020 1006   GLUCOSEU NEGATIVE 11/28/2014 0338   HGBUR NEGATIVE 08/03/2020 1006   BILIRUBINUR NEGATIVE 08/03/2020 1006   BILIRUBINUR NEGATIVE 11/28/2014 0338   KETONESUR NEGATIVE 08/03/2020 1006   PROTEINUR NEGATIVE 08/03/2020 1006   NITRITE NEGATIVE 08/03/2020 1006   LEUKOCYTESUR NEGATIVE 08/03/2020 1006   LEUKOCYTESUR NEGATIVE 11/28/2014 0338   Sepsis Labs Invalid input(s): PROCALCITONIN,  WBC,  LACTICIDVEN Microbiology Recent Results (from the past 240 hour(s))  Resp Panel by RT-PCR (Flu A&B, Covid) Nasopharyngeal Swab     Status: None   Collection Time: 07/29/20  2:04 AM   Specimen: Nasopharyngeal Swab; Nasopharyngeal(NP) swabs in vial transport medium  Result Value Ref Range Status   SARS Coronavirus 2 by RT PCR NEGATIVE NEGATIVE Final    Comment: (NOTE) SARS-CoV-2 target nucleic  acids are NOT DETECTED.  The SARS-CoV-2 RNA is generally detectable in upper respiratory specimens during the acute phase of infection. The lowest concentration of SARS-CoV-2 viral copies this assay can detect is 138 copies/mL. A negative result does not preclude SARS-Cov-2 infection and should not be used as the sole basis for treatment or other patient management decisions. A negative result may occur with  improper specimen collection/handling, submission of specimen other than nasopharyngeal swab, presence of viral mutation(s) within the areas targeted by this assay, and inadequate number of viral copies(<138 copies/mL). A negative result must be combined with clinical observations, patient history, and epidemiological information. The expected result is Negative.  Fact Sheet for Patients:  BloggerCourse.com  Fact Sheet for Healthcare Providers:  SeriousBroker.it  This test is no t yet approved or cleared by the [place] FDA and  has been authorized for detection and/or diagnosis of SARS-CoV-2 by FDA under an Emergency Use Authorization (EUA). This EUA will remain  in effect (meaning this test can be used) for the duration of the COVID-19 declaration under Section 564(b)(1) of the Act, 21 U.S.C.section 360bbb-3(b)(1), unless the authorization is terminated  or revoked sooner.       Influenza A by PCR NEGATIVE NEGATIVE Final   Influenza B by PCR NEGATIVE NEGATIVE Final    Comment: (NOTE) The Xpert Xpress SARS-CoV-2/FLU/RSV plus assay is intended as an aid in the diagnosis of influenza from Nasopharyngeal swab specimens and should not be used as a sole basis for treatment. Nasal washings and aspirates are unacceptable for Xpert Xpress SARS-CoV-2/FLU/RSV testing.  Fact Sheet for Patients: BloggerCourse.com  Fact Sheet for Healthcare Providers: SeriousBroker.it  This  test is not yet approved or cleared by the [place] FDA and has been authorized for detection and/or diagnosis of SARS-CoV-2 by FDA under an Emergency Use Authorization (EUA). This EUA will remain in effect (meaning this test can be used) for the duration of the COVID-19 declaration under Section 564(b)(1) of the Act, 21 U.S.C. section 360bbb-3(b)(1), unless the authorization is terminated or revoked.  Performed at [facility], [address] [place], [place] [address]   Resp Panel by RT-PCR (Flu A&B, Covid) Nasopharyngeal Swab     Status: None   Collection Time: 08/03/20  8:57 AM   Specimen: Nasopharyngeal Swab; Nasopharyngeal(NP) swabs in vial transport medium  Result Value Ref Range Status   SARS Coronavirus 2 by RT PCR NEGATIVE NEGATIVE Final    Comment: (NOTE) SARS-CoV-2 target nucleic acids are NOT DETECTED.  The SARS-CoV-2 RNA is generally detectable in upper respiratory specimens during the acute phase of infection. The lowest concentration of SARS-CoV-2 viral copies this assay can detect is 138 copies/mL. A negative result does not preclude SARS-Cov-2 infection and should not be used as the sole basis for treatment or other patient management decisions. A negative result may occur with  improper specimen collection/handling, submission of specimen other than nasopharyngeal swab, presence of viral mutation(s) within the areas targeted by this assay, and inadequate number of viral copies(<138 copies/mL). A negative result must be combined with clinical observations, patient history, and epidemiological information. The expected result is Negative.  Fact Sheet for Patients:  BloggerCourse.comhttps://www.fda.gov/media/152166/download  Fact Sheet for Healthcare Providers:  SeriousBroker.ithttps://www.fda.gov/media/152162/download  This test is no t yet approved or cleared by the [place] FDA and  has been authorized for detection and/or diagnosis of SARS-CoV-2 by FDA under an  Emergency Use Authorization (EUA). This EUA will remain  in effect (meaning this test can be used) for the duration of the COVID-19 declaration under Section 564(b)(1) of the Act, 21 U.S.C.section 360bbb-3(b)(1), unless the authorization is terminated  or revoked sooner.       Influenza A by PCR NEGATIVE NEGATIVE Final   Influenza B by PCR NEGATIVE NEGATIVE Final    Comment: (NOTE) The Xpert Xpress SARS-CoV-2/FLU/RSV plus assay is intended as an aid in the diagnosis of influenza from Nasopharyngeal swab specimens and should not be used as a sole basis for treatment. Nasal washings and aspirates are unacceptable for Xpert Xpress SARS-CoV-2/FLU/RSV testing.  Fact Sheet for Patients: BloggerCourse.comhttps://www.fda.gov/media/152166/download  Fact Sheet for Healthcare Providers: SeriousBroker.ithttps://www.fda.gov/media/152162/download  This test is not yet approved or cleared by the [place] FDA and has been authorized for detection and/or diagnosis of SARS-CoV-2 by FDA under an Emergency Use Authorization (EUA). This EUA will remain in effect (meaning this test can be used) for the duration of the COVID-19 declaration under Section 564(b)(1) of the Act, 21 U.S.C. section 360bbb-3(b)(1), unless the authorization is terminated or revoked.  Performed at [facility], [address] [place], [place] [address]   Blood culture (routine single)     Status: None (Preliminary result)   Collection Time: 08/03/20  9:35 AM   Specimen: BLOOD  Result Value Ref Range Status   Specimen Description BLOOD LEFT HAND  Final   Special Requests   Final    BOTTLES DRAWN AEROBIC AND ANAEROBIC Blood Culture adequate volume   Culture   Final    NO GROWTH 2 DAYS Performed at [facility], [address] [place], [place] [address]    Report Status PENDING  Incomplete  Urine culture     Status: Abnormal   Collection Time: 08/03/20 10:06 AM   Specimen: Urine, Random  Result Value Ref Range Status    Specimen Description   Final    URINE, RANDOM Performed at [facility], [address] [place], [place] [address]    Special Requests   Final    NONE Performed at [facility], [address] [place], [place] [address]    Culture (A)  Final    <10,000 COLONIES/mL INSIGNIFICANT GROWTH Performed at [facility], [address] [place], [place] [address]    Report Status 08/04/2020 FINAL  Final  Respiratory Panel by PCR     Status: Abnormal   Collection Time: 08/04/20  9:48 AM   Specimen: Nasopharyngeal Swab; Respiratory  Result Value Ref Range Status   Adenovirus NOT DETECTED  NOT DETECTED Final   Coronavirus 229E NOT DETECTED NOT DETECTED Final    Comment: (NOTE) The Coronavirus on the Respiratory Panel, DOES NOT test for the novel  Coronavirus (2019 nCoV)    Coronavirus HKU1 NOT DETECTED NOT DETECTED Final   Coronavirus NL63 NOT DETECTED NOT DETECTED Final   Coronavirus OC43 NOT DETECTED NOT DETECTED Final   Metapneumovirus NOT DETECTED NOT DETECTED Final   Rhinovirus / Enterovirus NOT DETECTED NOT DETECTED Final   Influenza A NOT DETECTED NOT DETECTED Final   Influenza B NOT DETECTED NOT DETECTED Final   Parainfluenza Virus 1 NOT DETECTED NOT DETECTED Final   Parainfluenza Virus 2 NOT DETECTED NOT DETECTED Final   Parainfluenza Virus 3 NOT DETECTED NOT DETECTED Final   Parainfluenza Virus 4 NOT DETECTED NOT DETECTED Final   Respiratory Syncytial Virus DETECTED (A) NOT DETECTED Final   Bordetella pertussis NOT DETECTED NOT DETECTED Final   Chlamydophila pneumoniae NOT DETECTED NOT DETECTED Final   Mycoplasma pneumoniae NOT DETECTED NOT DETECTED Final    Comment: Performed at [facility], [address] [place], [place] [address]  Expectorated sputum assessment w rflx to resp cult     Status: None   Collection Time: 08/04/20 10:34 AM   Specimen: Sputum  Result Value Ref Range Status   Specimen Description SPUTUM  Final   Special Requests  SPUTUM  Final   Sputum evaluation   Final    THIS SPECIMEN IS ACCEPTABLE FOR SPUTUM CULTURE Performed at [facility], [address], [place], [place] [address]    Report Status 08/04/2020 FINAL  Final  Culture, respiratory     Status: None (Preliminary result)   Collection Time: 08/04/20 10:34 AM   Specimen: SPU  Result Value Ref Range Status   Specimen Description   Final    SPUTUM Performed at [facility], [address], [place], [place] [address]    Special Requests   Final    SPUTUM Reflexed from [id number] Performed at [facility], [address] [place], [place] [address]    Gram Stain   Final    RARE WBC PRESENT, PREDOMINANTLY PMN RARE GRAM VARIABLE ROD    Culture   Final    CULTURE REINCUBATED FOR BETTER GROWTH Performed at [facility], [address], [place], [place] [address]    Report Status PENDING  Incomplete     Time coordinating discharge: Over 30 minutes  SIGNED:   Hughie Closs, MD  [facility] 08/05/2020, 9:28 AM  If 7PM-7AM, please contact night-coverage [email or web address]

## 2020-08-05 NOTE — TOC Initial Note (Signed)
Transition of Care [facility]) - Initial/Assessment Note    Patient Details  Name: Jennifer [name] MRN: [id number] Date of Birth: 04/14/1971  [facility] CM/SW Contact:    Maree Krabbe, LCSW Phone Number: 08/05/2020, 10:03 AM  Clinical Narrative:      Pt states she lives alone however does have family in the area. Pt states she drives herself to her appointments. CSW unable to get a agency to accept Medicaid and patient is agreeable to outpatient PT. Pt states she would like to do it at a clinic in [place]. CSW also is ordering pt a bedside commode and rolling walker which will be delivered at bedside.   Referral will be faxed to [place] outpatient PT clinic.          Expected Discharge Plan: Home/Self Care Barriers to Discharge: No Barriers Identified   Patient Goals and CMS Choice Patient states their goals for this hospitalization and ongoing recovery are:: to get bettter   Choice offered to / list presented to : Patient  Expected Discharge Plan and Services Expected Discharge Plan: Home/Self Care In-house Referral: NA   Post Acute Care Choice: Home Health Living arrangements for the past 2 months: Single Family Home Expected Discharge Date: 08/05/20               DME Arranged: Dan Humphreys rolling, Bedside commode DME Agency: [employer] Date DME Agency Contacted: 08/05/20 Time DME Agency Contacted: 1003 Representative spoke with at DME Agency: patricia            Prior Living Arrangements/Services Living arrangements for the past 2 months: Single Family Home Lives with:: Self Patient language and need for interpreter reviewed:: Yes Do you feel safe going back to the place where you live?: Yes      Need for Family Participation in Patient Care: Yes (Comment) Care giver support system in place?: Yes (comment)   Criminal Activity/Legal Involvement Pertinent to Current Situation/Hospitalization: No - Comment as needed  Activities of Daily Living Home  Assistive Devices/Equipment: None ADL Screening (condition at time of admission) Patient's cognitive ability adequate to safely complete daily activities?: Yes Is the patient deaf or have difficulty hearing?: No Does the patient have difficulty seeing, even when wearing glasses/contacts?: No Does the patient have difficulty concentrating, remembering, or making decisions?: No Patient able to express need for assistance with ADLs?: Yes Does the patient have difficulty dressing or bathing?: No Independently performs ADLs?: Yes (appropriate for developmental age) Does the patient have difficulty walking or climbing stairs?: No Weakness of Legs: Both Weakness of Arms/Hands: Both  Permission Sought/Granted Permission sought to share information with : Family Supports    Share Information with NAME: Environmental education officer     Permission granted to share info w Relationship: friend     Emotional Assessment Appearance:: Appears stated age Attitude/Demeanor/Rapport: Engaged Affect (typically observed): Accepting, Appropriate Orientation: : Oriented to Self, Oriented to Place, Oriented to  Time, Oriented to Situation Alcohol / Substance Use: Not Applicable Psych Involvement: No (comment)  Admission diagnosis:  Sarcoidosis [D86.9] Abdominal pain [R10.9] Sepsis (HCC) [A41.9] Community acquired pneumonia, unspecified laterality [J18.9] Patient Active Problem List   Diagnosis Date Noted  . Sepsis (HCC) 08/03/2020  . Thyrotoxicosis   . Depression   . Atypical chest pain 09/15/2017  . PVC's (premature ventricular contractions) 09/15/2017  . 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 Sarcoidosis 03/23/2016  . Pneumonia due to Pseudomonas (HCC) 02/10/2016  . Pulmonary cavitary lesion   . Hilar adenopathy   . Current  use of steroid medication 06/17/2015  . Long term current use of systemic steroids 01/03/2015  . Sinus tachycardia 01/03/2015  . Fungal pneumonia 11/23/2014  . Cough 11/22/2014  . SOB (shortness  of breath) 11/22/2014  . Cavitary lesion of lung 11/22/2014   PCP:  Judeen Hammans, MD Pharmacy:   [facility] [address] [place] - [address] AT [facility] [address] [place] [address] Phone: [phone] Fax: [phone]  [facility] [place], [place] - [address] [place] [address] Phone: [phone] Fax: [phone]     Social Determinants of Health (SDOH) Interventions    Readmission Risk Interventions No flowsheet data found.

## 2020-08-06 LAB — IGE: IgE (Immunoglobulin E), Serum: 163 IU/mL (ref 6–495)

## 2020-08-06 LAB — THYROID PANEL WITH TSH
Free Thyroxine Index: 2.5 (ref 1.2–4.9)
T3 Uptake Ratio: 34 % (ref 24–39)
T4, Total: 7.4 ug/dL (ref 4.5–12.0)
TSH: <0.005 u[IU]/mL — ABNORMAL LOW (ref 0.450–4.500)

## 2020-08-06 LAB — HISTOPLASMA ANTIGEN, URINE: Histoplasma Antigen, urine: <0.5 (ref <0.5)

## 2020-08-08 LAB — CULTURE, BLOOD (SINGLE)
Culture: NO GROWTH
Special Requests: ADEQUATE

## 2020-08-08 LAB — FUNGITELL, SERUM: Fungitell Result: <31 pg/mL (ref <80)

## 2020-08-08 LAB — CULTURE, RESPIRATORY W GRAM STAIN

## 2020-08-12 ENCOUNTER — Encounter: Payer: Self-pay | Admitting: Internal Medicine

## 2020-08-12 ENCOUNTER — Other Ambulatory Visit: Payer: Self-pay

## 2020-08-12 ENCOUNTER — Ambulatory Visit (INDEPENDENT_AMBULATORY_CARE_PROVIDER_SITE_OTHER): Payer: Medicaid Other | Admitting: Internal Medicine

## 2020-08-12 VITALS — BP 128/64 | HR 78 | Ht <= 58 in | Wt 112.0 lb

## 2020-08-12 DIAGNOSIS — I493 Ventricular premature depolarization: Secondary | ICD-10-CM

## 2020-08-12 DIAGNOSIS — D869 Sarcoidosis, unspecified: Secondary | ICD-10-CM

## 2020-08-12 DIAGNOSIS — R000 Tachycardia, unspecified: Secondary | ICD-10-CM | POA: Diagnosis not present

## 2020-08-12 DIAGNOSIS — R0602 Shortness of breath: Secondary | ICD-10-CM | POA: Diagnosis not present

## 2020-08-12 NOTE — Progress Notes (Signed)
Follow-up Outpatient Visit Date: 08/12/2020  Primary Care Provider: Judeen Hammans, MD [address] [place] [address]  Chief Complaint: Shortness of breath  HPI:  Jennifer [name] is a 49 y.o. female with history of frequent PVCs, sarcoidosis, fibromyalgia, asthma, and anxiety, who presents for follow-up of PVC's and shortness of breath.  She was last seen in our office in 12/2019, at which time she noted a few episodes of pleuritic chest pain with coughing.  She otherwise was feeling well.  She was hospitalized last month after presenting to the [facility] ED with cough, palpitations, and weakness.  Symptoms were felt to be due to RSV pneumonia and superimposed hyperthyroidism.  Today, Jennifer [name] reports that she still does not feel back to her baseline, though her breathing is slowly improving.  She feels more achy and weak after receiving her flu shot last week.  She experienced some chest discomfort while hospitalized with RSV last month, though this has not recurred.  She also had some transient edema at this time but feels like her swelling is no longer present.  She has not had any significant palpitations since being discharged from the hospital.  She notes, however, that she almost passed out on the day that she presented to the emergency department.  She is scheduled to go back to work next week.  --------------------------------------------------------------------------------------------------  Cardiovascular History & Procedures: Cardiovascular Problems:  Frequent PVCs  Atypical chest pain and shortness of breath (history of pulmonary sarcoidosis)  Risk Factors:  None  Cath/PCI:  None  CV Surgery:  None  EP Procedures and Devices:  24-hour Holter monitor (03/26/17): Predominantly sinus rhythm with frequent PVCs (6% burden; previously 13%). No VT or other sustained arrhythmia.  24-hour Holter monitor (06/29/16): Predominantly sinus rhythm with frequent  PVCs and rare PACs. PVC burden was 13%.  Non-Invasive Evaluation(s):  Pharmacologic MPI (08/25/2018): Low risk study without ischemia or scar.  LVEF 62%.  Significant GI uptake noted.  TTE (09/20/2017): Normal LV size and wall thickness. LVEF 55 to 60% with normal wall motion and diastolic function. Normal RV size and function. Normal PA pressure.  TTE (06/29/16): Normal LV size with LVEF of 50-55%. Normal wall motion and diastolic function. Mild MR. Normal RV size and function. Normal pulmonary artery pressure.  Recent CV Pertinent Labs: Lab Results  Component Value Date   INR 1.1 08/03/2020   K 3.7 08/05/2020   K 4.0 11/28/2014   MG 2.0 07/29/2020   BUN 14 08/05/2020   BUN 5 (L) 06/16/2018   BUN < 5 (L) 11/28/2014   CREATININE 0.48 08/05/2020   CREATININE 0.67 11/28/2014    Past medical and surgical history were reviewed and updated in EPIC.  Current Meds  Medication Sig  . acetaminophen (TYLENOL) 500 MG tablet Take 500-1,000 mg by mouth every 6 (six) hours as needed for mild pain or fever.   Marland Kitchen ADVAIR DISKUS 250-50 MCG/DOSE AEPB Inhale 1 puff into the lungs 2 (two) times daily.  Marland Kitchen asenapine (SAPHRIS) 5 MG SUBL 24 hr tablet Place 5 mg under the tongue daily.  . divalproex (DEPAKOTE ER) 500 MG 24 hr tablet Take 500 mg by mouth daily.   Marland Kitchen ibuprofen (ADVIL) 200 MG tablet Take 400-600 mg by mouth every 6 (six) hours as needed for fever or mild pain.   Marland Kitchen lactulose (CHRONULAC) 10 GM/15ML solution Take 15 mLs (10 g total) by mouth 2 (two) times daily.  . methimazole (TAPAZOLE) 10 MG tablet Take 30 mg  by mouth daily.   . metoprolol tartrate (LOPRESSOR) 100 MG tablet Take 1 tablet (100 mg total) by mouth 2 (two) times daily.  Marland Kitchen omeprazole (PRILOSEC) 40 MG capsule Take 1 capsule (40 mg total) by mouth daily.  . polyethylene glycol powder (GLYCOLAX/MIRALAX) 17 GM/SCOOP powder Take 17 g by mouth as needed.  Marland Kitchen PROAIR HFA 108 (90 Base) MCG/ACT inhaler Inhale 1 puff into the lungs every 6  (six) hours as needed.  Marland Kitchen SAPHRIS 5 MG SUBL 24 hr tablet Place 1 tablet (5 mg total) under the tongue daily.  . Vitamin D, Cholecalciferol, 25 MCG (1000 UT) TABS Take 500 Units by mouth daily.    Allergies: Penicillin g  Social History   Tobacco Use  . Smoking status: Never Smoker  . Smokeless tobacco: Never Used  Vaping Use  . Vaping Use: Never used  Substance Use Topics  . Alcohol use: No    Alcohol/week: 0.0 standard drinks  . Drug use: No    Family History  Problem Relation Age of Onset  . Lung cancer Mother     Review of Systems: A 12-system review of systems was performed and was negative except as noted in the HPI.  --------------------------------------------------------------------------------------------------  Physical Exam: BP 128/64 (BP Location: Left Arm, Patient Position: Sitting, Cuff Size: Normal)   Pulse 78   Ht 4\' 9"  (1.448 m)   Wt 112 lb (50.8 kg)   SpO2 98%   BMI 24.24 kg/m   General: NAD. Neck: No JVD or HJR. Lungs: Clear to auscultation bilaterally without wheezes or crackles. Heart: Regular rate and rhythm with 1/6 systolic murmur.  No rubs or gallops. Abdomen: Soft, nondistended.  Mild diffuse tenderness. Extremities: No lower extremity edema.  EKG: Normal sinus rhythm with left atrial enlargement.  Compared with prior tracing from 08/03/2020, heart rate has decreased and PVCs are no longer present.  Lab Results  Component Value Date   WBC 11.0 (H) 08/05/2020   HGB 10.6 (L) 08/05/2020   HCT 33.4 (L) 08/05/2020   MCV 74.6 (L) 08/05/2020   PLT 258 08/05/2020    Lab Results  Component Value Date   NA 140 08/05/2020   K 3.7 08/05/2020   CL 110 08/05/2020   CO2 23 08/05/2020   BUN 14 08/05/2020   CREATININE 0.48 08/05/2020   GLUCOSE 106 (H) 08/05/2020   ALT 13 08/03/2020    No results found for: CHOL, HDL, LDLCALC, LDLDIRECT, TRIG,  CHOLHDL  --------------------------------------------------------------------------------------------------  ASSESSMENT AND PLAN: Sarcoidosis, PVCs, and shortness of breath: Recent worsening of palpitations, dyspnea, and chest pain were most likely precipitated by RSV infection.  Jennifer [name] is not back to baseline yet but seems to be improving.  I have recommended that we move forward with cardiac MRI to exclude cardiac sarcoidosis.  We will continue her current medications including metoprolol tartrate 100 mg twice daily.  Jennifer [name] should follow-up with her pulmonologist, Dr. Delories Heinz, at her earliest convenience.  Sinus tachycardia: Most likely driven by recent RSV infection and untreated hyperthyroidism.  I encouraged Jennifer [name] to continue her current medications, including methimazole, and to follow-up with her endocrinologist as previously recommended.  Heart rate is well controlled today.  Follow-up: Return to clinic in 6 months.  Belia Heman, MD 08/12/2020 10:44 AM

## 2020-08-12 NOTE — Patient Instructions (Signed)
Medication Instructions:  Your physician recommends that you continue on your current medications as directed. Please refer to the Current Medication list given to you today.  *If you need a refill on your cardiac medications before your next appointment, please call your pharmacy*  Lab Work: none  If you have labs (blood work) drawn today and your tests are completely normal, you will receive your results only by: Marland Kitchen MyChart Message (if you have MyChart) OR . A paper copy in the mail If you have any lab test that is abnormal or we need to change your treatment, we will call you to review the results.  Testing/Procedures: You are scheduled for Cardiac MRI on ___________________________.  Please arrive at the [facility] main entrance of [facility] at _________________* (30-45 minutes prior to test start time). ?   [facility]  [address]  [place], [place] [address]  [phone]  Proceed to the [facility] Radiology Department (First Floor).  ?  Magnetic resonance imaging (MRI) is a painless test that produces images of the inside of the body without using X-rays. During an MRI, strong magnets and radio waves work together in a Data processing manager to form detailed images.  MRI images may provide more details about a medical condition than X-rays, CT scans, and ultrasounds can provide.  You may be given earphones to listen for instructions.  You may eat a light breakfast and take medications as ordered. If a contrast material will be used, an IV will be inserted into one of your veins. Contrast material will be injected into your IV.   You will be asked to remove all metal, including: Watch, jewelry, and other metal objects including hearing aids, hair pieces and dentures. (Braces and fillings normally are not a problem.)  If contrast material was used:   It will leave your body through your urine within a day. You may be told to drink plenty of fluids to help  flush the contrast material out of your system.  TEST WILL TAKE APPROXIMATELY 1 HOUR  PLEASE NOTIFY SCHEDULING AT LEAST 24 HOURS IN ADVANCE IF YOU ARE UNABLE TO KEEP YOUR APPOINTMENT.      Follow-Up: At [facility], you and your health needs are our priority.  As part of our continuing mission to provide you with exceptional heart care, we have created designated Provider Care Teams.  These Care Teams include your primary Cardiologist (physician) and Advanced Practice Providers (APPs -  Physician Assistants and Nurse Practitioners) who all work together to provide you with the care you need, when you need it.  We recommend signing up for the patient portal called "MyChart".  Sign up information is provided on this After Visit Summary.  MyChart is used to connect with patients for Virtual Visits (Telemedicine).  Patients are able to view lab/test results, encounter notes, upcoming appointments, etc.  Non-urgent messages can be sent to your provider as well.   To learn more about what you can do with MyChart, go to [email or web address].    Your next appointment:   6 month(s)  The format for your next appointment:   In Person  Provider:   You may see Yvonne Kendall, MD or one of the following Advanced Practice Providers on your designated Care Team:    Nicolasa Ducking, NP  Eula Listen, PA-C  Marisue Ivan, PA-C  Cadence [place], [place]  Gillian Shields, NP    Magnetic Resonance Imaging Magnetic resonance imaging (MRI) is a painless test  that takes pictures of the inside of your body. This test uses a strong magnet. This test does not use X-rays. What happens before the procedure?  You will be asked about any metal you may have in your body. This includes: ? Any joint replacement (prosthesis), such as an artificial knee or hip. ? Any implanted devices or ports. ? A metallic ear implant (cochlear implant). ? An artificial heart valve. ? A metallic object in the eye. ? Metal  splinters. ? Bullet fragments. ? Any tattoos. Some of the darker inks can cause problems with testing.  You will be asked to take off all metal. This includes: ? Your watch, jewelry, and other metal objects. ? Hearing aids. ? Dentures. ? Underwire bra. ? Makeup. Some kinds of makeup contain small amounts of metal. ? Braces and fillings normally are not a problem.  If you are breastfeeding, ask your doctor if you need to pump before your test and then stop breastfeeding for a short time. You may need to do this if dye (contrast material) will be used during your MRI.  If you have a fear of cramped spaces (claustrophobia), you may be given medicines prior to the MRI. What happens during the procedure?   You may be given earplugs or headphones to listen to music. The MRI machine can be noisy.  You will lie down on a platform. It looks like a long table.  If dye will be used, an IV tube will be placed into one of your veins. Dye will be given through your IV tube at a certain time as images are taken.  The platform will slide into a tunnel. The tunnel has magnets inside of it. When you are inside the tunnel, you will still be able to talk to your doctor.  The tunnel will scan your body and make images. You will be asked to lie very still. Your doctor will tell you when you can move. You may have to wait a few minutes to make sure that the images from the test are clear.  When all images are taken, the platform will slide out of the tunnel. The procedure may vary among doctors and hospitals. What happens after the procedure?  You may be taken to a recovery area if sedation medicines were used. Your blood pressure, heart rate, breathing rate, and blood oxygen level will be monitored until you leave the hospital or clinic.  If dye was used: ? It will leave your body through your pee (urine). It takes about a day for all of the dye to leave the body. ? You may be told to drink plenty of  fluids. This helps your body get rid of the dye. ? If you are breastfeeding, do not breastfeed your child until your doctor says that this is safe.  You may go back to your normal activities right away, or as told by your doctor.  It is up to you to get your test results. Ask your doctor, or the department that is doing the test, when your results will be ready. Summary  Magnetic resonance imaging (MRI) is a test that takes pictures of the inside of your body.  Before your MRI, be sure to tell your doctor about any metal you may have in your body.  You may go back to your normal activities right away, or as told by your doctor. This information is not intended to replace advice given to you by your health care provider. Make sure  you discuss any questions you have with your health care provider. Document Revised: 07/19/2017 Document Reviewed: 07/19/2017 Elsevier Patient Education  2020 [employer].

## 2020-08-13 ENCOUNTER — Telehealth: Payer: Self-pay | Admitting: Internal Medicine

## 2020-08-13 ENCOUNTER — Other Ambulatory Visit: Payer: Self-pay | Admitting: Family Medicine

## 2020-08-13 DIAGNOSIS — Z1231 Encounter for screening mammogram for malignant neoplasm of breast: Secondary | ICD-10-CM

## 2020-08-13 NOTE — Telephone Encounter (Signed)
Spoke with patient regarding preferred weekdays and times for scheduling the Cardiac MRI ordered by Dr. End----informed patient as soon as we hear from the insurance company regarding the prior authorization--I will be in touch with her appointment.  Patient voiced her understanding.

## 2020-08-15 ENCOUNTER — Encounter: Payer: Self-pay | Admitting: Internal Medicine

## 2020-08-15 NOTE — Telephone Encounter (Signed)
Left message for patient regarding the Cardiac MRI appointment scheduled Thursday 09/05/20 at 2:00 pm at [facility]---arrival tims is 1:30 pm --1st floor admissions office for check in.  Will mail information to patient and it is also in My Chart.  Requested patient call with questions or concerns.

## 2020-08-20 ENCOUNTER — Emergency Department: Payer: Medicaid Other

## 2020-08-20 ENCOUNTER — Emergency Department
Admission: EM | Admit: 2020-08-20 | Discharge: 2020-08-20 | Disposition: A | Discharge status: Home / Self Care | Payer: Medicaid Other | Attending: Emergency Medicine | Admitting: Emergency Medicine

## 2020-08-20 ENCOUNTER — Other Ambulatory Visit: Payer: Self-pay

## 2020-08-20 DIAGNOSIS — M25552 Pain in left hip: Secondary | ICD-10-CM | POA: Diagnosis not present

## 2020-08-20 DIAGNOSIS — R519 Headache, unspecified: Secondary | ICD-10-CM | POA: Insufficient documentation

## 2020-08-20 DIAGNOSIS — Z79899 Other long term (current) drug therapy: Secondary | ICD-10-CM | POA: Insufficient documentation

## 2020-08-20 DIAGNOSIS — M25551 Pain in right hip: Secondary | ICD-10-CM | POA: Insufficient documentation

## 2020-08-20 DIAGNOSIS — M25512 Pain in left shoulder: Secondary | ICD-10-CM | POA: Diagnosis not present

## 2020-08-20 DIAGNOSIS — M791 Myalgia, unspecified site: Secondary | ICD-10-CM | POA: Diagnosis not present

## 2020-08-20 DIAGNOSIS — M25511 Pain in right shoulder: Secondary | ICD-10-CM | POA: Insufficient documentation

## 2020-08-20 DIAGNOSIS — Z20822 Contact with and (suspected) exposure to covid-19: Secondary | ICD-10-CM | POA: Insufficient documentation

## 2020-08-20 DIAGNOSIS — M545 Low back pain, unspecified: Secondary | ICD-10-CM | POA: Insufficient documentation

## 2020-08-20 DIAGNOSIS — J45909 Unspecified asthma, uncomplicated: Secondary | ICD-10-CM | POA: Insufficient documentation

## 2020-08-20 DIAGNOSIS — M546 Pain in thoracic spine: Secondary | ICD-10-CM | POA: Insufficient documentation

## 2020-08-20 LAB — CBC WITH DIFFERENTIAL/PLATELET
Abs Immature Granulocytes: 0.02 10*3/uL (ref 0.00–0.07)
Basophils Absolute: 0 10*3/uL (ref 0.0–0.1)
Basophils Relative: 0 %
Eosinophils Absolute: 0.1 10*3/uL (ref 0.0–0.5)
Eosinophils Relative: 1 %
HCT: 41.5 % (ref 36.0–46.0)
Hemoglobin: 13.2 g/dL (ref 12.0–15.0)
Immature Granulocytes: 0 %
Lymphocytes Relative: 33 %
Lymphs Abs: 2.2 10*3/uL (ref 0.7–4.0)
MCH: 24 pg — ABNORMAL LOW (ref 26.0–34.0)
MCHC: 31.8 g/dL (ref 30.0–36.0)
MCV: 75.6 fL — ABNORMAL LOW (ref 80.0–100.0)
Monocytes Absolute: 0.8 10*3/uL (ref 0.1–1.0)
Monocytes Relative: 12 %
Neutro Abs: 3.7 10*3/uL (ref 1.7–7.7)
Neutrophils Relative %: 54 %
Platelets: 275 10*3/uL (ref 150–400)
RBC: 5.49 MIL/uL — ABNORMAL HIGH (ref 3.87–5.11)
RDW: 15.6 % — ABNORMAL HIGH (ref 11.5–15.5)
WBC: 6.8 10*3/uL (ref 4.0–10.5)
nRBC: 0 % (ref 0.0–0.2)

## 2020-08-20 LAB — POC URINE PREG, ED: Preg Test, Ur: NEGATIVE

## 2020-08-20 LAB — COMPREHENSIVE METABOLIC PANEL
ALT: 14 U/L (ref 0–44)
AST: 12 U/L — ABNORMAL LOW (ref 15–41)
Albumin: 3.6 g/dL (ref 3.5–5.0)
Alkaline Phosphatase: 77 U/L (ref 38–126)
Anion gap: 9 (ref 5–15)
BUN: 13 mg/dL (ref 6–20)
CO2: 24 mmol/L (ref 22–32)
Calcium: 8.9 mg/dL (ref 8.9–10.3)
Chloride: 104 mmol/L (ref 98–111)
Creatinine, Ser: 0.54 mg/dL (ref 0.44–1.00)
GFR, Estimated: >60 mL/min (ref >60)
Glucose, Bld: 127 mg/dL — ABNORMAL HIGH (ref 70–99)
Potassium: 3.4 mmol/L — ABNORMAL LOW (ref 3.5–5.1)
Sodium: 137 mmol/L (ref 135–145)
Total Bilirubin: 0.7 mg/dL (ref 0.3–1.2)
Total Protein: 6.9 g/dL (ref 6.5–8.1)

## 2020-08-20 LAB — URINALYSIS, COMPLETE (UACMP) WITH MICROSCOPIC
Bacteria, UA: NONE SEEN
Bilirubin Urine: NEGATIVE
Glucose, UA: NEGATIVE mg/dL
Hgb urine dipstick: NEGATIVE
Ketones, ur: NEGATIVE mg/dL
Leukocytes,Ua: NEGATIVE
Nitrite: NEGATIVE
Protein, ur: NEGATIVE mg/dL
Specific Gravity, Urine: 1.001 — ABNORMAL LOW (ref 1.005–1.030)
WBC, UA: NONE SEEN WBC/hpf (ref 0–5)
pH: 7 (ref 5.0–8.0)

## 2020-08-20 LAB — RESP PANEL BY RT-PCR (FLU A&B, COVID) ARPGX2
Influenza A by PCR: NEGATIVE
Influenza B by PCR: NEGATIVE
SARS Coronavirus 2 by RT PCR: NEGATIVE

## 2020-08-20 MED ORDER — PREDNISONE 20 MG PO TABS
60.0000 mg | ORAL_TABLET | Freq: Once | ORAL | Status: AC
  Administered 2020-08-20: 13:00:00 60 mg via ORAL
  Filled 2020-08-20: qty 3

## 2020-08-20 MED ORDER — PREDNISONE 10 MG (21) PO TBPK: ORAL_TABLET | ORAL | 0 refills | Status: DC

## 2020-08-20 MED ORDER — ONDANSETRON HCL 4 MG PO TABS: 4.0000 mg | ORAL_TABLET | Freq: Four times a day (QID) | ORAL | 1 refills | Status: AC | PRN

## 2020-08-20 MED ORDER — FAMOTIDINE 20 MG PO TABS
40.0000 mg | ORAL_TABLET | Freq: Once | ORAL | Status: AC
  Administered 2020-08-20: 13:00:00 40 mg via ORAL
  Filled 2020-08-20: qty 2

## 2020-08-20 MED ORDER — KETOROLAC TROMETHAMINE 10 MG PO TABS
10.0000 mg | ORAL_TABLET | Freq: Once | ORAL | Status: AC
  Administered 2020-08-20: 13:00:00 10 mg via ORAL
  Filled 2020-08-20: qty 1

## 2020-08-20 NOTE — Discharge Instructions (Addendum)
Your Covid test today was negative.  Continue taking your omeprazole, and take the prednisone as prescribed today.  You should also take ibuprofen or Aleve to help with the muscle pain.

## 2020-08-20 NOTE — ED Provider Notes (Signed)
[facility] Emergency Department Provider Note  ____________________________________________  Time seen: Approximately 2:35 PM  I have reviewed the triage vital signs and the nursing notes.   HISTORY  Chief Complaint Headache and Back Pain    HPI Jennifer [name] is a 49 y.o. female with a history of asthma, anxiety, fibromyalgia, sarcoidosis, depression, hyperthyroidism taking metoprolol and methimazole.  Who complains of pain in bilateral shoulders, upper back, lower back, bilateral hips.  Mostly generalized myalgia.  Denies focal chest pain or shortness of breath, no vomiting or diarrhea.  No exertional symptoms.  Gradual onset constant waxing and waning loss of muscle pain is moderate intensity, not tearing or thunderclap.  Nonradiating. Does not take statins.  No new medications recently.    Past Medical History:  Diagnosis Date  . Anxiety   . Asthma   . Chest pain    a. 08/2018 MV: EF 62%, no ischemia/scar.  . Depression   . Fibromyalgia   . History of echocardiogram    a. 09/2017 Echo: Nl LV size/fxn/wall thickness.  . Hyperthyroidism   . Incontinence   . Lymphadenopathy   . Pneumonia    bilateral community acquired   . PVC's (premature ventricular contractions)   . PVC's (premature ventricular contractions)    a. 06/2016 24h Holter: Sinus rhythm, freq PVCs (13% burden), rare PACs; b. 03/2017 24h Holter: Predominantly sinus rhythm. Freq PVCs (6% burden).  . Sarcoidosis   . Sepsis (HCC)   . Shortness of breath dyspnea   . Tachycardia      Patient Active Problem List   Diagnosis Date Noted  . Sepsis (HCC) 08/03/2020  . Thyrotoxicosis   . Depression   . Atypical chest pain 09/15/2017  . PVC's (premature ventricular contractions) 09/15/2017  . 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 Sarcoidosis 03/23/2016  . Pneumonia due to Pseudomonas (HCC) 02/10/2016  . Pulmonary cavitary lesion   . Hilar adenopathy   . Current use of steroid medication  06/17/2015  . Long term current use of systemic steroids 01/03/2015  . Sinus tachycardia 01/03/2015  . Fungal pneumonia 11/23/2014  . Cough 11/22/2014  . Shortness of breath 11/22/2014  . Cavitary lesion of lung 11/22/2014     Past Surgical History:  Procedure Laterality Date  . CESAREAN SECTION     1992/1994  . ENDOBRONCHIAL ULTRASOUND N/A 01/27/2016   Procedure: ENDOBRONCHIAL ULTRASOUND;  Surgeon: Stephanie Acre, MD;  Location: [facility] ORS;  Service: Cardiopulmonary;  Laterality: N/A;  . LYMPH NODE BIOPSY    . VIDEO BRONCHOSCOPY N/A 01/27/2016   Procedure: VIDEO BRONCHOSCOPY WITH FLUORO;  Surgeon: Stephanie Acre, MD;  Location: [facility] ORS;  Service: Cardiopulmonary;  Laterality: N/A;     Prior to Admission medications   Medication Sig Start Date End Date Taking? Authorizing Provider  acetaminophen (TYLENOL) 500 MG tablet Take 500-1,000 mg by mouth every 6 (six) hours as needed for mild pain or fever.     [provider]  ADVAIR DISKUS 250-50 MCG/DOSE AEPB Inhale 1 puff into the lungs 2 (two) times daily. 09/08/18   Erin Fulling, MD  divalproex (DEPAKOTE ER) 500 MG 24 hr tablet Take 500 mg by mouth daily.     [provider]  ibuprofen (ADVIL) 200 MG tablet Take 400-600 mg by mouth every 6 (six) hours as needed for fever or mild pain.     [provider]  lactulose (CHRONULAC) 10 GM/15ML solution Take 15 mLs (10 g total) by mouth 2 (two) times daily. 08/05/20   Pahwani,  Daleen Bo, MD  methimazole (TAPAZOLE) 10 MG tablet Take 30 mg by mouth daily.  05/29/19   [provider]  metoprolol tartrate (LOPRESSOR) 100 MG tablet Take 1 tablet (100 mg total) by mouth 2 (two) times daily. 03/09/18   End, Cristal Deer, MD  omeprazole (PRILOSEC) 40 MG capsule Take 1 capsule (40 mg total) by mouth daily. 04/06/18   Wyline Mood, MD  ondansetron (ZOFRAN) 4 MG tablet Take 1 tablet (4 mg total) by mouth every 6 (six) hours as needed for nausea or vomiting. 08/20/20   Sharman Cheek,  MD  polyethylene glycol powder (GLYCOLAX/MIRALAX) 17 GM/SCOOP powder Take 17 g by mouth as needed. 08/05/20   Hughie Closs, MD  predniSONE (STERAPRED UNI-PAK 21 TAB) 10 MG (21) TBPK tablet 6 tablets on day 1, then 5 tablets on day 2, then 4 tablets on day 3, then 3 tablets on day 4, then 2 tablets on day 5, then 1 tablet on day 6. 08/20/20   Sharman Cheek, MD  [facility] HFA 108 [phone] Base) MCG/ACT inhaler Inhale 1 puff into the lungs every 6 (six) hours as needed. 09/08/18   Erin Fulling, MD  SAPHRIS 5 MG SUBL 24 hr tablet Place 1 tablet (5 mg total) under the tongue daily. 08/05/20   Hughie Closs, MD  Vitamin D, Cholecalciferol, 25 MCG (1000 UT) TABS Take 500 Units by mouth daily. 08/05/20   Hughie Closs, MD     Allergies Penicillin g   Family History  Problem Relation Age of Onset  . Lung cancer Mother     Social History Social History   Tobacco Use  . Smoking status: Never Smoker  . Smokeless tobacco: Never Used  Vaping Use  . Vaping Use: Never used  Substance Use Topics  . Alcohol use: No    Alcohol/week: 0.0 standard drinks  . Drug use: No    Review of Systems  Constitutional:   No fever or chills.  ENT:   No sore throat. No rhinorrhea. Cardiovascular:   No chest pain or syncope. Respiratory:   No dyspnea or cough. Gastrointestinal:   Negative for abdominal pain, vomiting and diarrhea.  Musculoskeletal:   Positive muscle aches as above All other systems reviewed and are negative except as documented above in ROS and HPI.  ____________________________________________   PHYSICAL EXAM:  VITAL SIGNS: ED Triage Vitals  Enc Vitals Group     BP 08/20/20 1027 (!) 142/85     Pulse Rate 08/20/20 1027 84     Resp 08/20/20 1027 18     Temp 08/20/20 1027 98.7 F (37.1 C)     Temp Source 08/20/20 1027 Oral     SpO2 08/20/20 1027 99 %     Weight 08/20/20 1032 111 lb 15.9 oz (50.8 kg)     Height 08/20/20 1032 4\' 9"  (1.448 m)     Head Circumference --      Peak  Flow --      Pain Score 08/20/20 1031 8     Pain Loc --      Pain Edu? --      Excl. in GC? --     Vital signs reviewed, nursing assessments reviewed.   Constitutional:   Alert and oriented. Non-toxic appearance. Eyes:   Conjunctivae are normal. EOMI. PERRL. ENT      Head:   Normocephalic and atraumatic.      Nose:   Wearing a mask.      Mouth/Throat:   Wearing a mask.  Neck:   No meningismus. Full ROM. Hematological/Lymphatic/Immunilogical:   No cervical lymphadenopathy. Cardiovascular:   RRR. Symmetric bilateral radial and DP pulses.  No murmurs. Cap refill less than 2 seconds. Respiratory:   Normal respiratory effort without tachypnea/retractions. Breath sounds are clear and equal bilaterally. No wheezes/rales/rhonchi. Gastrointestinal:   Soft and nontender. Non distended. There is no CVA tenderness.  No rebound, rigidity, or guarding. Musculoskeletal:   Normal range of motion in all extremities. No joint effusions.  No lower extremity tenderness.  No edema.  Musculature of the upper back and shoulders is tender to the touch reproducing her pain.  No midline spinal tenderness. Neurologic:   Normal speech and language.  Motor grossly intact. No acute focal neurologic deficits are appreciated.  Skin:    Skin is warm, dry and intact. No rash noted.  No petechiae, purpura, or bullae.  ____________________________________________    LABS (pertinent positives/negatives) (all labs ordered are listed, but only abnormal results are displayed) Labs Reviewed  CBC WITH DIFFERENTIAL/PLATELET - Abnormal; Notable for the following components:      Result Value   RBC 5.49 (*)    MCV 75.6 (*)    MCH 24.0 (*)    RDW 15.6 (*)    All other components within normal limits  COMPREHENSIVE METABOLIC PANEL - Abnormal; Notable for the following components:   Potassium 3.4 (*)    Glucose, Bld 127 (*)    AST 12 (*)    All other components within normal limits  URINALYSIS, COMPLETE (UACMP)  WITH MICROSCOPIC - Abnormal; Notable for the following components:   Color, Urine COLORLESS (*)    APPearance CLEAR (*)    Specific Gravity, Urine 1.001 (*)    All other components within normal limits  RESP PANEL BY RT-PCR (FLU A&B, COVID) ARPGX2  POC URINE PREG, ED   ____________________________________________   EKG    ____________________________________________    RADIOLOGY  DG Chest 2 View  Result Date: 08/20/2020 CLINICAL DATA:  Body aches and cough. EXAM: CHEST - 2 VIEW COMPARISON:  08/03/2020.  CTA chest 08/03/2020. FINDINGS: Lungs are hyperexpanded. The cardiopericardial silhouette is within normal limits for size. Interstitial markings are diffusely coarsened with chronic features. Cavitary lesion in the left apex again noted, better characterized on previous CT. No pleural effusion or pneumothorax. The visualized bony structures of the thorax show no acute abnormality. IMPRESSION: 1. Stable exam. Cavitary lesion left apex. 2. No new or progressive findings. Electronically Signed   By: Kennith Center M.D.   On: 08/20/2020 12:59    ____________________________________________   PROCEDURES Procedures  ____________________________________________    CLINICAL IMPRESSION / ASSESSMENT AND PLAN / ED COURSE  Medications ordered in the ED: Medications  predniSONE (DELTASONE) tablet 60 mg (60 mg Oral Given 08/20/20 1312)  ketorolac (TORADOL) tablet 10 mg (10 mg Oral Given 08/20/20 1312)  famotidine (PEPCID) tablet 40 mg (40 mg Oral Given 08/20/20 1312)    Pertinent labs & imaging results that were available during my care of the patient were reviewed by me and considered in my medical decision making (see chart for details).  Jennifer [name] was evaluated in Emergency Department on 08/20/2020 for the symptoms described in the history of present illness. She was evaluated in the context of the global COVID-19 pandemic, which necessitated consideration that the patient  might be at risk for infection with the SARS-CoV-2 virus that causes COVID-19. Institutional protocols and algorithms that pertain to the evaluation of patients at risk for COVID-19 are in  a state of rapid change based on information released by regulatory bodies including the CDC and federal and state organizations. These policies and algorithms were followed during the patient's care in the ED.   Patient presents with diffuse myalgias, possibly related to her history of fibromyalgia versus inflammatory condition such as rheumatoid arthritis or sarcoidosis.  Labs chest x-ray and Covid test are all unremarkable.  Vital signs are normal, exam is reassuring.  Patient given Pepcid Toradol prednisone in the ED, stable for discharge home to continue prednisone Dosepak, NSAIDs.  She is on a PPI at home which can serve as gastric protection.  Recommend follow-up with primary care.      ____________________________________________   FINAL CLINICAL IMPRESSION(S) / ED DIAGNOSES    Final diagnoses:  Myalgia     ED Discharge Orders         Ordered    predniSONE (STERAPRED UNI-PAK 21 TAB) 10 MG (21) TBPK tablet        08/20/20 1434    ondansetron (ZOFRAN) 4 MG tablet  Every 6 hours PRN        08/20/20 1434          Portions of this note were generated with dragon dictation software. Dictation errors may occur despite best attempts at proofreading.   Sharman CheekStafford, Takiyah Bohnsack, MD 08/20/20 1438

## 2020-08-20 NOTE — ED Notes (Signed)
Pt provided with crackers.

## 2020-08-20 NOTE — ED Triage Notes (Addendum)
C/O Pain to bilateral shoulders, upper back x 2 days. Also c/o headache.  Patient difficult to determine reason for ED presentation.  Multiple complaints.    AAOx3. Skin warm and dry. NAD. No SOB/ DOE

## 2020-09-04 ENCOUNTER — Telehealth (HOSPITAL_COMMUNITY): Payer: Self-pay | Admitting: Emergency Medicine

## 2020-09-04 NOTE — Telephone Encounter (Signed)
Reaching out to patient to offer assistance regarding upcoming cardiac imaging study; pt verbalizes understanding of appt date/time, parking situation and where to check in, pre-test NPO status and medications ordered, and verified current allergies; name and call back number provided for further questions should they arise Rockwell Alexandria RN Navigator Cardiac Imaging [facility] [phone] office [phone] cell  Denies metal implants, denies claustro [place] Dec

## 2020-09-05 ENCOUNTER — Ambulatory Visit (HOSPITAL_COMMUNITY)
Admission: RE | Admit: 2020-09-05 | Discharge: 2020-09-05 | Disposition: A | Discharge status: Home / Self Care | Payer: Medicaid Other | Source: Ambulatory Visit | Attending: Internal Medicine | Admitting: Internal Medicine

## 2020-09-05 ENCOUNTER — Other Ambulatory Visit: Payer: Self-pay

## 2020-09-05 DIAGNOSIS — D869 Sarcoidosis, unspecified: Secondary | ICD-10-CM | POA: Insufficient documentation

## 2020-09-05 MED ORDER — GADOBUTROL 1 MMOL/ML IV SOLN
7.0000 mL | Freq: Once | INTRAVENOUS | Status: AC | PRN
  Administered 2020-09-05: 7 mL via INTRAVENOUS

## 2020-09-09 ENCOUNTER — Telehealth: Payer: Self-pay | Admitting: *Deleted

## 2020-09-09 NOTE — Telephone Encounter (Signed)
-----   Message from Yvonne Kendall, MD sent at 09/09/2020 12:52 PM EST ----- Please let Ms. Wiggs know that her cardiac MRI is normal without evidence of sarcoidosis involving her heart.  She should continue her current medications and follow-up as previously discussed.

## 2020-09-09 NOTE — Telephone Encounter (Signed)
Results released to My Chart. No answer. Left detailed message with results and on MyChart, ok per DPR, and to call back if any questions.

## 2020-09-10 NOTE — Telephone Encounter (Signed)
The patient has been notified of the result and verbalized understanding.  All questions (if any) were answered. Sherlynn Stalls Arayah Krouse, RN 09/10/2020 10:23 AM

## 2020-09-25 ENCOUNTER — Encounter: Payer: Self-pay | Admitting: Internal Medicine

## 2020-09-25 ENCOUNTER — Ambulatory Visit (INDEPENDENT_AMBULATORY_CARE_PROVIDER_SITE_OTHER): Payer: Medicaid Other | Admitting: Internal Medicine

## 2020-09-25 ENCOUNTER — Other Ambulatory Visit: Payer: Self-pay

## 2020-09-25 VITALS — BP 128/72 | HR 66 | Temp 97.1°F | Ht <= 58 in | Wt 122.4 lb

## 2020-09-25 DIAGNOSIS — R918 Other nonspecific abnormal finding of lung field: Secondary | ICD-10-CM | POA: Diagnosis not present

## 2020-09-25 DIAGNOSIS — R9389 Abnormal findings on diagnostic imaging of other specified body structures: Secondary | ICD-10-CM | POA: Diagnosis not present

## 2020-09-25 NOTE — Patient Instructions (Signed)
Recommend PET scan to further evaluate lung nodule

## 2020-09-25 NOTE — Progress Notes (Signed)
[facility] Pulmonary Medicine Consultation      MRN# [id number] Jennifer [name] [birth year] Brief History: Synopsis: 50 year old female with left upper lobe cavitary lesion, history of bilateral pneumonia, currently being followed by pulmonary and infectious disease. Cavitary lesion differential at this time includes fungal infection versus sarcoidosis. 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  Previous 6-minute walk and ONO within normal limits  Has intermittent fatigue at times She does not want to taper steroids at this time Robitussin with codeine helps her cough   CC:  Follow-up shortness of breath    Events since last clinic visit:  HPI Chronic shortness of breath and previous history and clinical diagnosis of sarcoid +fevers at this time but NO resp issues-no cough no wheezing no shortness of breath  CT chest August 03, 2020  Independently reviewed and also reviewed with patient Chronic bilateral cylindrical bronchiectasis Persistent cavitary lesion in the left upper lobe containing intramural soft tissue mass. The area of cavitation is similar to 2017 study with slight enlargement of the intramural soft tissue mass.   I will order PET scan to further evaluate this intramural soft tissue mass in the left upper lobe cavitary lesion  Patient also has extreme back pain which is unrelated to her lung disease PET scan however will give [place] more information on her back as well    Medication:    Current Outpatient Medications:  .  acetaminophen (TYLENOL) 500 MG tablet, Take 500-1,000 mg by mouth every 6 (six) hours as needed for mild pain or fever. , Disp: , Rfl:  .  ADVAIR DISKUS 250-50 MCG/DOSE AEPB, Inhale 1 puff into the lungs 2 (two) times daily., Disp: 60 each, Rfl: 5 .  divalproex (DEPAKOTE ER) 500 MG 24 hr tablet, Take 500 mg by mouth daily. , Disp: , Rfl:  .  ibuprofen (ADVIL) 200  MG tablet, Take 400-600 mg by mouth every 6 (six) hours as needed for fever or mild pain. , Disp: , Rfl:  .  lactulose (CHRONULAC) 10 GM/15ML solution, Take 15 mLs (10 g total) by mouth 2 (two) times daily., Disp: 236 mL, Rfl:  .  methimazole (TAPAZOLE) 10 MG tablet, Take 30 mg by mouth daily. , Disp: , Rfl:  .  metoprolol tartrate (LOPRESSOR) 100 MG tablet, Take 1 tablet (100 mg total) by mouth 2 (two) times daily., Disp: 180 tablet, Rfl: 1 .  omeprazole (PRILOSEC) 40 MG capsule, Take 1 capsule (40 mg total) by mouth daily., Disp: 90 capsule, Rfl: 3 .  ondansetron (ZOFRAN) 4 MG tablet, Take 1 tablet (4 mg total) by mouth every 6 (six) hours as needed for nausea or vomiting., Disp: 20 tablet, Rfl: 1 .  polyethylene glycol powder (GLYCOLAX/MIRALAX) 17 GM/SCOOP powder, Take 17 g by mouth as needed., Disp: 255 g, Rfl: 0 .  PROAIR HFA 108 (90 Base) MCG/ACT inhaler, Inhale 1 puff into the lungs every 6 (six) hours as needed., Disp: 18 g, Rfl: 5 .  SAPHRIS 5 MG SUBL 24 hr tablet, Place 1 tablet (5 mg total) under the tongue daily., Disp: 60 tablet, Rfl: 0 .  Vitamin D, Cholecalciferol, 25 MCG (1000 UT) TABS, Take 500 Units by mouth daily., Disp: 60 tablet, Rfl:     Review of Systems  Constitutional: Negative for chills, fever and weight loss.  HENT: Negative for ear discharge, ear pain, hearing loss, nosebleeds and tinnitus.   Eyes: Negative for blurred vision and pain.  Respiratory: Negative for cough, sputum production and shortness of breath.   Cardiovascular: Negative for chest pain and palpitations.  Gastrointestinal: Negative for heartburn, nausea and vomiting.  Skin: Negative for rash.  Neurological: Negative for dizziness and headaches.  Endo/Heme/Allergies: Positive for environmental allergies. Does not bruise/bleed easily.     Review of Systems:  Gen:  Denies  fever, sweats, chills weight loss  HEENT: Denies blurred vision, double vision, ear pain, eye pain, hearing loss, nose  bleeds, sore throat Cardiac:  No dizziness, chest pain or heaviness, chest tightness,edema, No JVD Resp:   No cough, -sputum production, -shortness of breath,-wheezing, -hemoptysis,  Gi: Denies swallowing difficulty, stomach pain, nausea or vomiting, diarrhea, constipation, bowel incontinence Gu:  Denies bladder incontinence, burning urine Ext:   Denies Joint pain, stiffness or swelling Skin: Denies  skin rash, easy bruising or bleeding or hives Endoc:  Denies polyuria, polydipsia , polyphagia or weight change Psych:   Denies depression, insomnia or hallucinations  +back pain with palpation Other:  All other systems negative   BP 128/72 (BP Location: Left Arm, Cuff Size: Normal)   Pulse 66   Temp (!) 97.1 F (36.2 C) (Temporal)   Ht 4\' 9"  (1.448 m)   Wt 122 lb 6.4 oz (55.5 kg)   SpO2 100%   BMI 26.49 kg/m    Allergies:  Penicillin g    Physical Examination:   General Appearance: No distress  Neuro:without focal findings,  speech normal,  HEENT: PERRLA, EOM intact.   Pulmonary: normal breath sounds, No wheezing.  CardiovascularNormal S1,S2.  No m/r/g.   Abdomen: Benign, Soft, non-tender. Renal:  No costovertebral tenderness  GU:  Not performed at this time. Endoc: No evident thyromegaly Skin:   warm, no rashes, no ecchymosis  Extremities: normal, no cyanosis, clubbing. PSYCHIATRIC: Mood, affect within normal limits. 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   At this time patient has a intramural soft tissue mass that is very subtle in the left upper lobe cavitary lesion which needs further evaluation with PET scan Further evaluation with bronchoscopy may be warranted   Previous history of  aspergilloma Previously had voriconazole therapy early in 2016 but developed a fungus ball on CT chest ID has taken her off voriconazole   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  The importance of social distancing was discussed today. Hand Washing Techniques and avoid touching face was advised.     MEDICATION ADJUSTMENTS/LABS AND TESTS ORDERED: PET scan ordered   CURRENT MEDICATIONS REVIEWED AT LENGTH WITH PATIENT TODAY   Patient satisfied with Plan of action and management. All questions answered  Follow up in 3 months  TOTAL TIME SPENT 32 mins  2017 Wallis Bamberg, M.D.  [facility]  Medical Director [facility] Medical Director [facility] Cardio-Pulmonary Department

## 2020-09-27 ENCOUNTER — Ambulatory Visit
Admission: RE | Admit: 2020-09-27 | Discharge: 2020-09-27 | Disposition: A | Discharge status: Home / Self Care | Payer: Medicaid Other | Source: Ambulatory Visit | Attending: Family Medicine | Admitting: Family Medicine

## 2020-09-27 ENCOUNTER — Other Ambulatory Visit: Payer: Self-pay

## 2020-09-27 DIAGNOSIS — Z1231 Encounter for screening mammogram for malignant neoplasm of breast: Secondary | ICD-10-CM | POA: Insufficient documentation

## 2020-10-07 ENCOUNTER — Other Ambulatory Visit: Payer: Self-pay | Admitting: Family Medicine

## 2020-10-07 DIAGNOSIS — N6489 Other specified disorders of breast: Secondary | ICD-10-CM

## 2020-10-07 DIAGNOSIS — R928 Other abnormal and inconclusive findings on diagnostic imaging of breast: Secondary | ICD-10-CM

## 2020-10-10 ENCOUNTER — Other Ambulatory Visit: Payer: Self-pay

## 2020-10-10 ENCOUNTER — Ambulatory Visit
Admission: RE | Admit: 2020-10-10 | Discharge: 2020-10-10 | Disposition: A | Discharge status: Home / Self Care | Payer: Medicaid Other | Source: Ambulatory Visit | Attending: Internal Medicine | Admitting: Internal Medicine

## 2020-10-10 DIAGNOSIS — R918 Other nonspecific abnormal finding of lung field: Secondary | ICD-10-CM | POA: Diagnosis present

## 2020-10-10 DIAGNOSIS — J984 Other disorders of lung: Secondary | ICD-10-CM | POA: Insufficient documentation

## 2020-10-10 LAB — GLUCOSE, CAPILLARY: Glucose-Capillary: 88 mg/dL (ref 70–99)

## 2020-10-10 MED ORDER — FLUDEOXYGLUCOSE F - 18 (FDG) INJECTION
6.3000 | Freq: Once | INTRAVENOUS | Status: AC | PRN
  Administered 2020-10-10: 7 via INTRAVENOUS

## 2020-10-11 ENCOUNTER — Ambulatory Visit
Admission: RE | Admit: 2020-10-11 | Discharge: 2020-10-11 | Disposition: A | Discharge status: Home / Self Care | Payer: Medicaid Other | Source: Ambulatory Visit | Attending: Family Medicine | Admitting: Family Medicine

## 2020-10-11 DIAGNOSIS — N6489 Other specified disorders of breast: Secondary | ICD-10-CM

## 2020-10-11 DIAGNOSIS — R928 Other abnormal and inconclusive findings on diagnostic imaging of breast: Secondary | ICD-10-CM | POA: Insufficient documentation

## 2020-11-12 ENCOUNTER — Ambulatory Visit
Admission: RE | Admit: 2020-11-12 | Discharge: 2020-11-12 | Disposition: A | Discharge status: Home / Self Care | Payer: Medicaid Other | Attending: Physician Assistant | Admitting: Physician Assistant

## 2020-11-12 ENCOUNTER — Other Ambulatory Visit: Payer: Self-pay | Admitting: Physician Assistant

## 2020-11-12 ENCOUNTER — Ambulatory Visit
Admission: RE | Admit: 2020-11-12 | Discharge: 2020-11-12 | Disposition: A | Discharge status: Home / Self Care | Payer: Medicaid Other | Source: Ambulatory Visit | Attending: Physician Assistant | Admitting: Physician Assistant

## 2020-11-12 ENCOUNTER — Other Ambulatory Visit: Payer: Self-pay

## 2020-11-12 DIAGNOSIS — M545 Low back pain, unspecified: Secondary | ICD-10-CM

## 2021-01-02 ENCOUNTER — Ambulatory Visit: Payer: Medicaid Other | Admitting: Internal Medicine

## 2021-02-05 ENCOUNTER — Encounter: Payer: Self-pay | Admitting: Internal Medicine

## 2021-02-05 ENCOUNTER — Other Ambulatory Visit: Payer: Self-pay

## 2021-02-05 ENCOUNTER — Ambulatory Visit (INDEPENDENT_AMBULATORY_CARE_PROVIDER_SITE_OTHER): Payer: Medicaid Other | Admitting: Internal Medicine

## 2021-02-05 VITALS — BP 120/82 | HR 90 | Ht <= 58 in | Wt 129.0 lb

## 2021-02-05 DIAGNOSIS — D869 Sarcoidosis, unspecified: Secondary | ICD-10-CM | POA: Diagnosis not present

## 2021-02-05 DIAGNOSIS — I493 Ventricular premature depolarization: Secondary | ICD-10-CM

## 2021-02-05 MED ORDER — BISOPROLOL FUMARATE 10 MG PO TABS: 20.0000 mg | ORAL_TABLET | Freq: Every day | ORAL | 5 refills | Status: DC

## 2021-02-05 NOTE — Patient Instructions (Signed)
Medication Instructions:  Your physician has recommended you make the following change in your medication:   STOP taking Metoprolol Tartrate  START Bisoprolol 20mg  DAILY  *If you need a refill on your cardiac medications before your next appointment, please call your pharmacy*   Lab Work:  None ordered  Testing/Procedures:  None ordered  Follow-Up: At [facility], you and your health needs are our priority.  As part of our continuing mission to provide you with exceptional heart care, we have created designated Provider Care Teams.  These Care Teams include your primary Cardiologist (physician) and Advanced Practice Providers (APPs -  Physician Assistants and Nurse Practitioners) who all work together to provide you with the care you need, when you need it.  We recommend signing up for the patient portal called "MyChart".  Sign up information is provided on this After Visit Summary.  MyChart is used to connect with patients for Virtual Visits (Telemedicine).  Patients are able to view lab/test results, encounter notes, upcoming appointments, etc.  Non-urgent messages can be sent to your provider as well.   To learn more about what you can do with MyChart, go to [facility].    Your next appointment:   3 month(s)  The format for your next appointment:   In Person  Provider:   You may see [email or web address], MD or one of the following Advanced Practice Providers on your designated Care Team:    Yvonne Kendall, NP  Nicolasa Ducking, PA-C  Eula Listen, PA-C  Cadence [place], [place]  [place], NP

## 2021-02-05 NOTE — Progress Notes (Signed)
Follow-up Outpatient Visit Date: 02/05/2021  Primary Care Provider: Judeen Hammans, MD [address] [place] [address]  Chief Complaint: Follow-up shortness of breath  HPI:  Jennifer [name] is a 50 y.o. female with history of frequent PVCs, sarcoidosis, fibromyalgia, asthma, and anxiety, who presents for follow-up of PVCs and shortness of breath.  I last saw Jennifer [name] in 08/2020, at which time she was still recovering from RSV pneumonia complicated by hyperthyroidism.  Due to frequent PVCs and concern for myocardial involvement with her sarcoidosis, she was referred for cardiac MRI.  This showed low normal LVEF without delayed hyperenhancement or wall motion abnormality.  RV size and function were normal.  No significant valvular abnormality was seen.  Today, Jennifer [name] reports feeling about the same as at our last visit.  She notes that warm weather and pollen often make her breathing worse.  She has exertional dyspnea with modest activities such as pushing carts at work.  She also feels a little dizzy at times when she gets hot.  She has been trying to stay well-hydrated.  She denies chest pain, palpitations, and edema.  She notes that she sometimes feels a little bit tired and short of breath after taking metoprolol.  However, if she does not take the medicine per day, she feels particularly sluggish.  --------------------------------------------------------------------------------------------------  Cardiovascular History & Procedures: Cardiovascular Problems:  Frequent PVCs  Atypical chest pain and shortness of breath (history of pulmonary sarcoidosis)  Risk Factors:  None  Cath/PCI:  None  CV Surgery:  None  EP Procedures and Devices:  24-hour Holter monitor (03/26/17): Predominantly sinus rhythm with frequent PVCs (6% burden; previously 13%). No VT or other sustained arrhythmia.  24-hour Holter monitor (06/29/16): Predominantly sinus rhythm with  frequent PVCs and rare PACs. PVC burden was 13%.  Non-Invasive Evaluation(s):  Cardiac MRI (09/05/2020): Normal LV size and wall thickness with low normal LVEF (50%).  No regional wall motion abnormality.  No late gadolinium enhancement to suggest infiltration/scar.  Normal RV size and function.  Normal biatrial size.  Normal aortic root, ascending aorta, and pulmonary artery.  No significant valvular abnormality.  Normal pericardium without effusion.  Pharmacologic MPI (08/25/2018): Low risk study without ischemia or scar. LVEF 62%. Significant GI uptake noted.  TTE (09/20/2017): Normal LV size and wall thickness. LVEF 55 to 60% with normal wall motion and diastolic function. Normal RV size and function. Normal PA pressure.  TTE (06/29/16): Normal LV size with LVEF of 50-55%. Normal wall motion and diastolic function. Mild MR. Normal RV size and function. Normal pulmonary artery pressure.   Recent CV Pertinent Labs: Lab Results  Component Value Date   INR 1.1 08/03/2020   K 3.4 (L) 08/20/2020   K 4.0 11/28/2014   MG 2.0 07/29/2020   BUN 13 08/20/2020   BUN 5 (L) 06/16/2018   BUN < 5 (L) 11/28/2014   CREATININE 0.54 08/20/2020   CREATININE 0.67 11/28/2014    Past medical and surgical history were reviewed and updated in EPIC.  Current Meds  Medication Sig  . acetaminophen (TYLENOL) 500 MG tablet Take 500-1,000 mg by mouth every 6 (six) hours as needed for mild pain or fever.   Marland Kitchen ADVAIR DISKUS 250-50 MCG/DOSE AEPB Inhale 1 puff into the lungs 2 (two) times daily.  . divalproex (DEPAKOTE ER) 500 MG 24 hr tablet Take 500 mg by mouth daily.   Marland Kitchen ibuprofen (ADVIL) 200 MG tablet Take 400-600 mg by mouth every 6 (six) hours  as needed for fever or mild pain.   Marland Kitchen lactulose (CHRONULAC) 10 GM/15ML solution Take 15 mLs (10 g total) by mouth 2 (two) times daily.  . methimazole (TAPAZOLE) 10 MG tablet Take 30 mg by mouth daily.   . metoprolol tartrate (LOPRESSOR) 100 MG tablet Take 1  tablet (100 mg total) by mouth 2 (two) times daily.  Marland Kitchen omeprazole (PRILOSEC) 40 MG capsule Take 1 capsule (40 mg total) by mouth daily.  . ondansetron (ZOFRAN) 4 MG tablet Take 1 tablet (4 mg total) by mouth every 6 (six) hours as needed for nausea or vomiting.  . polyethylene glycol powder (GLYCOLAX/MIRALAX) 17 GM/SCOOP powder Take 17 g by mouth as needed.  Marland Kitchen PROAIR HFA 108 (90 Base) MCG/ACT inhaler Inhale 1 puff into the lungs every 6 (six) hours as needed.  Marland Kitchen SAPHRIS 5 MG SUBL 24 hr tablet Place 1 tablet (5 mg total) under the tongue daily.  . Vitamin D, Cholecalciferol, 25 MCG (1000 UT) TABS Take 500 Units by mouth daily.    Allergies: Penicillin g  Social History   Tobacco Use  . Smoking status: Never Smoker  . Smokeless tobacco: Never Used  Vaping Use  . Vaping Use: Never used  Substance Use Topics  . Alcohol use: No    Alcohol/week: 0.0 standard drinks  . Drug use: No    Family History  Problem Relation Age of Onset  . Lung cancer Mother     Review of Systems: A 12-system review of systems was performed and was negative except as noted in the HPI.  --------------------------------------------------------------------------------------------------  Physical Exam: BP 120/82 (BP Location: Left Arm, Patient Position: Sitting, Cuff Size: Normal)   Pulse 90   Ht 4\' 9"  (1.448 m)   Wt 129 lb (58.5 kg)   SpO2 98%   BMI 27.92 kg/m   General:  NAD. Neck: No JVD or HJR. Lungs: Clear to auscultation bilaterally without wheezes or crackles. Heart: Regular rate and rhythm with frequent extrasystoles and 1/6 systolic murmur. Abdomen: Soft, nontender, nondistended. Extremities: No lower extremity edema.  EKG: Normal sinus rhythm with frequent PVCs, rightward axis, and possible left atrial enlargement.  Compared with prior tracing from 08/12/2020, PVCs are now present.  Lab Results  Component Value Date   WBC 6.8 08/20/2020   HGB 13.2 08/20/2020   HCT 41.5 08/20/2020   MCV  75.6 (L) 08/20/2020   PLT 275 08/20/2020    Lab Results  Component Value Date   NA 137 08/20/2020   K 3.4 (L) 08/20/2020   CL 104 08/20/2020   CO2 24 08/20/2020   BUN 13 08/20/2020   CREATININE 0.54 08/20/2020   GLUCOSE 127 (H) 08/20/2020   ALT 14 08/20/2020    No results found for: CHOL, HDL, LDLCALC, LDLDIRECT, TRIG, CHOLHDL  --------------------------------------------------------------------------------------------------  ASSESSMENT AND PLAN: Frequent PVCs: [name] denies palpitations but has some fatigue that could be due to frequent PVCs.  Her PVC burden was cut in half with addition of metoprolol.  However, Ms. Newhall is concerned about some side effects that she attributes to metoprolol.  As such, we have agreed to transition to bisoprolol 20 mg daily.  Notably, recent cardiac MRI showed low normal LVEF without delayed hyperenhancement to suggest infiltrative process in setting of the patient's sarcoidosis.  Sarcoidosis: Chronic dyspnea likely driven by sarcoidosis as well.  Ms. Stefanik should continue close follow-up with pulmonary.  Follow-up: Return to clinic in 3 months.  Delories Heinz, MD 02/05/2021 10:05 AM

## 2021-02-14 ENCOUNTER — Telehealth: Payer: Self-pay

## 2021-02-14 NOTE — Telephone Encounter (Signed)
Spoke with Dasia with [employer] [place] [employer] at [phone] for prior authorization approval for Bisoprolol 10 mg take two tablets (20) mg by mouth daily.  Prior Authorization for Bisoprolol 10 mg is approved from 02/14/2021 to 02/09/2022.  PA # [id number] Patient notified as well as the pharmacy.

## 2021-02-14 NOTE — Telephone Encounter (Signed)
Alexas Basulto ([employer]Seth Bake) - [id number] Drug Bisoprolol Fumarate 10MG  tablets Form [employer] Call-In Form To initiate the prior authorization process for this medication, please contact [employer] at [phone]. phone

## 2021-02-26 ENCOUNTER — Other Ambulatory Visit: Payer: Self-pay

## 2021-02-26 ENCOUNTER — Encounter: Payer: Self-pay | Admitting: Internal Medicine

## 2021-02-26 ENCOUNTER — Ambulatory Visit (INDEPENDENT_AMBULATORY_CARE_PROVIDER_SITE_OTHER): Payer: Medicaid Other | Admitting: Internal Medicine

## 2021-02-26 VITALS — BP 90/62 | HR 100 | Temp 98.2°F | Ht <= 58 in | Wt 131.8 lb

## 2021-02-26 DIAGNOSIS — R062 Wheezing: Secondary | ICD-10-CM | POA: Diagnosis not present

## 2021-02-26 DIAGNOSIS — J410 Simple chronic bronchitis: Secondary | ICD-10-CM

## 2021-02-26 MED ORDER — AZITHROMYCIN 250 MG PO TABS: ORAL_TABLET | ORAL | 0 refills | Status: DC

## 2021-02-26 MED ORDER — PROAIR HFA 108 (90 BASE) MCG/ACT IN AERS
1.2000 | INHALATION_SPRAY | Freq: Four times a day (QID) | RESPIRATORY_TRACT | 5 refills | Status: DC | PRN
Start: 2021-02-26 — End: 2024-05-30

## 2021-02-26 MED ORDER — ADVAIR HFA 230-21 MCG/ACT IN AERO: 2.0000 | INHALATION_SPRAY | Freq: Two times a day (BID) | RESPIRATORY_TRACT | 12 refills | Status: DC

## 2021-02-26 MED ORDER — PREDNISONE 20 MG PO TABS: 20.0000 mg | ORAL_TABLET | Freq: Every day | ORAL | 1 refills | Status: DC

## 2021-02-26 NOTE — Progress Notes (Signed)
[facility] Pulmonary Medicine Consultation      MRN# [id number] Jennifer [name] [birth year] Brief History: Synopsis: 50 year old female with left upper lobe cavitary lesion, history of bilateral pneumonia, currently being followed by pulmonary and infectious disease. Cavitary lesion differential at this time includes fungal infection versus sarcoidosis. 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  Previous 6-minute walk and ONO within normal limits  Has intermittent fatigue at times She does not want to taper steroids at this time Robitussin with codeine helps her cough  CT chest August 03, 2020  Independently reviewed and also reviewed with patient Chronic bilateral cylindrical bronchiectasis Persistent cavitary lesion in the left upper lobe containing intramural soft tissue mass. The area of cavitation is similar to 2017 study with slight enlargement of the intramural soft tissue mass.   I will order PET scan to further evaluate this intramural soft tissue mass in the left upper lobe cavitary lesion  Patient also has extreme back pain which is unrelated to her lung disease PET scan low level activity   CC: Follow up SOB    HPI  Patient has signs symptoms of infection Will treat as acute bronchitis  Will need prednisone antibiotic  Continue inhalers as prescribed Recommend COVID testing     Medication:    Current Outpatient Medications:    acetaminophen (TYLENOL) 500 MG tablet, Take 500-1,000 mg by mouth every 6 (six) hours as needed for mild pain or fever. , Disp: , Rfl:    ADVAIR DISKUS 250-50 MCG/DOSE AEPB, Inhale 1 puff into the lungs 2 (two) times daily., Disp: 60 each, Rfl: 5   bisoprolol (ZEBETA) 10 MG tablet, Take 2 tablets (20 mg total) by mouth daily., Disp: 60 tablet, Rfl: 5   divalproex (DEPAKOTE ER) 500 MG 24 hr tablet, Take 500 mg by mouth daily. , Disp: , Rfl:    ibuprofen  (ADVIL) 200 MG tablet, Take 400-600 mg by mouth every 6 (six) hours as needed for fever or mild pain. , Disp: , Rfl:    lactulose (CHRONULAC) 10 GM/15ML solution, Take 15 mLs (10 g total) by mouth 2 (two) times daily., Disp: 236 mL, Rfl:    methimazole (TAPAZOLE) 10 MG tablet, Take 30 mg by mouth daily. , Disp: , Rfl:    omeprazole (PRILOSEC) 40 MG capsule, Take 1 capsule (40 mg total) by mouth daily., Disp: 90 capsule, Rfl: 3   ondansetron (ZOFRAN) 4 MG tablet, Take 1 tablet (4 mg total) by mouth every 6 (six) hours as needed for nausea or vomiting., Disp: 20 tablet, Rfl: 1   polyethylene glycol powder (GLYCOLAX/MIRALAX) 17 GM/SCOOP powder, Take 17 g by mouth as needed., Disp: 255 g, Rfl: 0   PROAIR HFA 108 (90 Base) MCG/ACT inhaler, Inhale 1 puff into the lungs every 6 (six) hours as needed., Disp: 18 g, Rfl: 5   SAPHRIS 5 MG SUBL 24 hr tablet, Place 1 tablet (5 mg total) under the tongue daily., Disp: 60 tablet, Rfl: 0   Vitamin D, Cholecalciferol, 25 MCG (1000 UT) TABS, Take 500 Units by mouth daily., Disp: 60 tablet, Rfl:     Review of Systems  Constitutional:  Positive for chills and fever. Negative for weight loss.  HENT:  Negative for ear discharge, ear pain, hearing loss, nosebleeds and tinnitus.   Eyes:  Negative for blurred vision and pain.  Respiratory:  Positive for cough, sputum production and shortness of breath.   Cardiovascular:  Negative  for chest pain and palpitations.  Gastrointestinal:  Negative for heartburn, nausea and vomiting.  Skin:  Negative for rash.  Neurological:  Negative for dizziness and headaches.  Endo/Heme/Allergies:  Positive for environmental allergies. Does not bruise/bleed easily.    Review of Systems:  Gen:  Denies  fever, sweats, chills weight loss  HEENT: Denies blurred vision, double vision, ear pain, eye pain, hearing loss, nose bleeds, sore throat Cardiac:  No dizziness, chest pain or heaviness, chest tightness,edema, No JVD Resp:   No cough,  -sputum production, -shortness of breath,-wheezing, -hemoptysis,  Other:  All other systems negative   BP 90/62 (BP Location: Left Arm, Patient Position: Sitting, Cuff Size: Normal)   Pulse 100   Temp 98.2 F (36.8 C) (Oral)   Ht 4\' 9"  (1.448 m)   Wt 131 lb 12.8 oz (59.8 kg)   SpO2 99%   BMI 28.52 kg/m    Allergies:  Penicillin g   Physical Examination:   General Appearance: No distress  Neuro:without focal findings,  speech normal,  HEENT: PERRLA, EOM intact.   Pulmonary: normal breath sounds, No wheezing.  CardiovascularNormal S1,S2.  No m/r/g.   Abdomen: Benign, Soft, non-tender.  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 presenting for follow-up visit with a history of aspergilloma with signs symptoms of infection most likely from acute bronchitis  PET scan reviewed with patient No further diagnostic imaging or procedure at this time   Acute bronchitis Z-Pak Pred 20 mg daily for 10 days   Previous history of aspergilloma Previously had voriconazole therapy early in 2016 but developed a fungus ball on CT chest ID has taken her off voriconazole   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 With left upper lobe pulmonary cavitary lesion with soft intramural tissue mass Recommend PET scan for further assessment  Recommend COVID-19 testing   MEDICATION ADJUSTMENTS/LABS AND TESTS ORDERED:  Pred 20 mg daily for 10 days Start Z pak  Stay hydrated Continue inhalers as prescribed Recommend COVID 19 testing  CURRENT MEDICATIONS REVIEWED AT LENGTH WITH PATIENT TODAY   Follow-up 6 months  Total time spent 28 minutes  Jennifer [name] 05-29-1993, M.D.  [facility]  Medical  Director [facility] Medical Director [facility] Cardio-Pulmonary Department

## 2021-02-26 NOTE — Patient Instructions (Addendum)
Pred 20 mg daily for 10 days Start Z pak  Stay hydrated     Continue inhalers as prescribed   Recommend COVID 19 testing

## 2021-05-08 ENCOUNTER — Ambulatory Visit (INDEPENDENT_AMBULATORY_CARE_PROVIDER_SITE_OTHER): Payer: Medicaid Other | Admitting: Nurse Practitioner

## 2021-05-08 ENCOUNTER — Encounter: Payer: Self-pay | Admitting: Nurse Practitioner

## 2021-05-08 ENCOUNTER — Other Ambulatory Visit: Payer: Self-pay

## 2021-05-08 VITALS — BP 90/60 | HR 85 | Ht <= 58 in | Wt 138.0 lb

## 2021-05-08 DIAGNOSIS — R42 Dizziness and giddiness: Secondary | ICD-10-CM | POA: Diagnosis not present

## 2021-05-08 DIAGNOSIS — D869 Sarcoidosis, unspecified: Secondary | ICD-10-CM | POA: Diagnosis not present

## 2021-05-08 DIAGNOSIS — R079 Chest pain, unspecified: Secondary | ICD-10-CM

## 2021-05-08 DIAGNOSIS — I493 Ventricular premature depolarization: Secondary | ICD-10-CM

## 2021-05-08 NOTE — Progress Notes (Signed)
Office Visit    Patient Name: Brigitte PulseRenee B Schuler Date of Encounter: 05/08/2021  Primary Care Provider:  Judeen HammansSoles, Meredith Key, MD Primary Cardiologist:  Yvonne Kendallhristopher End, MD  Chief Complaint    50 year old female with a history of frequent PVCs, sarcoidosis, fibromyalgia, hypothyroidism, asthma, and anxiety, presents for follow-up related to PVCs.  Past Medical History    Past Medical History:  Diagnosis Date   Anxiety    Asthma    Chest pain    a. 08/2018 MV: EF 62%, no ischemia/scar.   Depression    Diastolic dysfunction    a. 09/2017 Echo: Nl LV size/fxn/wall thickness; b. 07/2020 Echo: EF 60-65%, no rwma, Gr1 DD. Nl RV size/fxn. Mild MR.   Fibromyalgia    Hyperthyroidism    Incontinence    Lymphadenopathy    Pneumonia    bilateral community acquired    PVC's (premature ventricular contractions)    a. 06/2016 24h Holter: Sinus rhythm, freq PVCs (13% burden), rare PACs; b. 03/2017 24h Holter: Predominantly sinus rhythm. Freq PVCs (6% burden); c. 08/2020 cMRI: EF 50%. Nl LV size/thickness. No LGE to suggest scar/infiltration. Nl RV size/fxn. Nl biatrial sizes. No significant valvular abnormalities.   Sarcoidosis    Sepsis (HCC)    Shortness of breath dyspnea    Tachycardia    Past Surgical History:  Procedure Laterality Date   CESAREAN SECTION     1992/1994   ENDOBRONCHIAL ULTRASOUND N/A 01/27/2016   Procedure: ENDOBRONCHIAL ULTRASOUND;  Surgeon: Stephanie AcreVishal Mungal, MD;  Location: [facility];  Service: Cardiopulmonary;  Laterality: N/A;   LYMPH NODE BIOPSY     VIDEO BRONCHOSCOPY N/A 01/27/2016   Procedure: VIDEO BRONCHOSCOPY WITH FLUORO;  Surgeon: Stephanie AcreVishal Mungal, MD;  Location: [facility];  Service: Cardiopulmonary;  Laterality: N/A;    Allergies  Allergies  Allergen Reactions   Penicillin G Hives    History of Present Illness    50 year old female with above past medical history including frequent PVCs, sarcoidosis, fibromyalgia, hypothyroidism, asthma, and anxiety.  Prior  Holter monitoring in October 2017 and again in July 2018, showed frequent PVCs, initially with a 13% burden and subsequently with a 6% burden.  She was previously evaluated for chest pain, dyspnea, and chronic 3 pillow orthopnea and was scheduled for cardiac CTA but this was canceled due to frequent PVCs and elevated heart rates.  She subsequently underwent stress testing in December 2019 which showed normal LV function without evidence of ischemia or scar.  Echo in November 2021 showed an EF of 60 to 65% with grade 1 diastolic dysfunction.  In December 2021, she reported worsening palpitations, dyspnea, and chest pain following RSV infection.  A cardiac MRI was performed in order to exclude cardiac sarcoidosis.  This showed low normal LV function with an EF of 50%, normal LV size and thickness, no LGE to suggest infiltration or scar, and normal RV size and function.  [name] was last seen in clinic on June 1 at which time she reported slight worsening of exertional dyspnea and occasional dizziness, especially in the setting of warm weather.  She had some concerns about side effects that she was attributing to metoprolol and this was switched to bisoprolol 20 mg daily.  Since her last visit, her symptoms appear to be relatively stable.  She notes that she has been having some more stress in her life and also intermittent hot flashes and so she has not been paying much attention to her symptoms.  Because metoprolol previously made her  feel fatigued and short of breath, she became accustomed to taking her beta-blocker after work instead of before work.  She has been doing the same thing with bisoprolol.  Because of the variable work schedule, there are times where she goes more than 32 hours between doses however.  She has some degree of chronic dyspnea on exertion.  She will occasionally notice elevations in heart rates but does not necessarily notice the frequency of her PVCs.  She had some PVCs on  twelve-lead today also during examination, which she did not notice.  She has had some orthostatic lightheadedness recently though this does not generally stop her from doing her work.  She has a long history of midsternal chest discomfort mostly occurs when lying down and seems to get better with getting up and walking around.  She is on a PPI and she thinks that that helps.  She denies PND, orthopnea, syncope, edema, or early satiety.  Home Medications    Current Outpatient Medications  Medication Sig Dispense Refill   acetaminophen (TYLENOL) 500 MG tablet Take 500-1,000 mg by mouth every 6 (six) hours as needed for mild pain or fever.      ADVAIR DISKUS 250-50 MCG/DOSE AEPB Inhale 1 puff into the lungs 2 (two) times daily. 60 each 5   azithromycin (ZITHROMAX Z-PAK) 250 MG tablet Take 2 tablets on Day 1 and then 1 tablet daily till gone. 6 each 0   bisoprolol (ZEBETA) 10 MG tablet Take 2 tablets (20 mg total) by mouth daily. 60 tablet 5   divalproex (DEPAKOTE ER) 500 MG 24 hr tablet Take 500 mg by mouth daily.      fluticasone-salmeterol (ADVAIR HFA) 230-21 MCG/ACT inhaler Inhale 2 puffs into the lungs 2 (two) times daily. 1 each 12   ibuprofen (ADVIL) 200 MG tablet Take 400-600 mg by mouth every 6 (six) hours as needed for fever or mild pain.      lactulose (CHRONULAC) 10 GM/15ML solution Take 15 mLs (10 g total) by mouth 2 (two) times daily. 236 mL    methimazole (TAPAZOLE) 10 MG tablet Take 30 mg by mouth daily.      omeprazole (PRILOSEC) 40 MG capsule Take 1 capsule (40 mg total) by mouth daily. 90 capsule 3   ondansetron (ZOFRAN) 4 MG tablet Take 1 tablet (4 mg total) by mouth every 6 (six) hours as needed for nausea or vomiting. 20 tablet 1   polyethylene glycol powder (GLYCOLAX/MIRALAX) 17 GM/SCOOP powder Take 17 g by mouth as needed. 255 g 0   predniSONE (DELTASONE) 20 MG tablet Take 1 tablet (20 mg total) by mouth daily with breakfast. 10 days 10 tablet 1   PROAIR HFA 108 (90 Base)  MCG/ACT inhaler Inhale 1 puff into the lungs every 6 (six) hours as needed. 18 g 5   SAPHRIS 5 MG SUBL 24 hr tablet Place 1 tablet (5 mg total) under the tongue daily. 60 tablet 0   Vitamin D, Cholecalciferol, 25 MCG (1000 UT) TABS Take 500 Units by mouth daily. 60 tablet    No current facility-administered medications for this visit.     Review of Systems    Several different complaints such as chronic dyspnea on exertion, occasional elevations in heart, orthostatic lightheadedness, supine chest discomfort that improves with standing and walking around, and intermittent hot flashes.  She denies PND, orthopnea, syncope, edema, or early satiety.  All other systems reviewed and are otherwise negative except as noted above.  Physical Exam  VS:  BP 90/60 (BP Location: Left Arm, Patient Position: Sitting, Cuff Size: Normal)   Pulse 85   Ht 4\' 9"  (1.448 m)   Wt 138 lb (62.6 kg)   SpO2 96%   BMI 29.86 kg/m  , BMI Body mass index is 29.86 kg/m.    Orthostatic VS for the past 24 hrs:  BP- Lying Pulse- Lying BP- Sitting Pulse- Sitting BP- Standing at 0 minutes Pulse- Standing at 0 minutes  05/08/21 1103 112/72 84 124/70 84 118/70 80    GEN: Well nourished, well developed, in no acute distress. HEENT: normal. Neck: Supple, no JVD, carotid bruits, or masses. Cardiac: RRR -occasional ectopy, no murmurs, rubs, or gallops. No clubbing, cyanosis, edema.  Radials/PT 2+ and equal bilaterally.  Respiratory:  Respirations regular and unlabored, clear to auscultation bilaterally. GI: Soft, nontender, nondistended, BS + x 4. MS: no deformity or atrophy. Skin: warm and dry, no rash. Neuro:  Strength and sensation are intact. Psych: Normal affect.  Accessory Clinical Findings    ECG personally reviewed by me today -sinus rhythm, 85, PVCs, nonspecific T changes- no acute changes.  Lab Results  Component Value Date   WBC 6.8 08/20/2020   HGB 13.2 08/20/2020   HCT 41.5 08/20/2020   MCV 75.6 (L)  08/20/2020   PLT 275 08/20/2020   Lab Results  Component Value Date   CREATININE 0.54 08/20/2020   BUN 13 08/20/2020   NA 137 08/20/2020   K 3.4 (L) 08/20/2020   CL 104 08/20/2020   CO2 24 08/20/2020   Lab Results  Component Value Date   ALT 14 08/20/2020   AST 12 (L) 08/20/2020   ALKPHOS 77 08/20/2020   BILITOT 0.7 08/20/2020    Assessment & Plan    1.  Frequent PVCs: Patient with a relatively long history of PVCs dating back to Holter monitoring October 2017 showing a burden of 13%, which was cut to 6% following addition of beta-blocker therapy.  Cardiac MRI in December 2021 showed low normal EF without LGE to suggest infiltrative process.  In the setting of fatigue, she was switched from metoprolol to bisoprolol at her last visit.  She says today that she has not noticed any significant difference in her overall wellbeing since changing to bisoprolol.  She occasionally notes elevations in heart rates and has some degree of chronic dyspnea on exertion.  Timing of her bisoprolol dose is dependent upon at what time she is scheduled to work.  If she is working the afternoon, she will take bisoprolol in the morning, and if she is working in the morning, she will take it in the afternoon.  She got on this type of schedule because of the way metoprolol made her feel previously.  In this setting, there are times where she may go greater than 32 hours in between bisoprolol doses.  I encouraged her to take bisoprolol once a day at the same time whether that be first thing in the morning or prior to bedtime, so that we may truly evaluate its effectiveness at limiting her fatigue and elevation in heart rates.  She notes today that she has not had much in the way of fatigue but also that she has been dealing with some life stress and intermittent hot flashes, and really has not been paying attention to her symptoms.  Of note, she did have PVCs on EKG and examination today, which were asymptomatic.  2.   Orthostatic lightheadedness: She is not orthostatic by vital signs  today.  I note that her initial resting blood pressure was low at 90/60 pressures were higher when I checked orthostatics.  Suspect PVCs may be throwing off some of her recordings resulting in a lower value being recorded.  No change to bisoprolol dose given lack of orthostatic hypotension.  3.  Sarcoidosis: Chronic dyspnea on exertion.  No cardiac sarcoid noted on prior cardiac MRI.  She is followed by pulmonology.  4.  General chest pain: This is chronic and intermittent.  This dates back at least to the time of her normal stress test in 2019 and is exacerbated by lying down.  This does not occur when she is active at work.  Symptoms improved with getting up and walking around.  Reassurance offered.  Continue PPI.  I did encourage her to consider keeping an diet diary to see if there are certain triggers that might make this more likely to happen.  5.  Disposition: Follow-up in clinic in 3 months or sooner if necessary.   Nicolasa Ducking, NP 05/08/2021, 11:05 AM

## 2021-05-08 NOTE — Patient Instructions (Signed)
Medication Instructions:  - Your physician recommends that you continue on your current medications as directed. Please refer to the Current Medication list given to you today.  *If you need a refill on your cardiac medications before your next appointment, please call your pharmacy*   Lab Work: - none ordered  If you have labs (blood work) drawn today and your tests are completely normal, you will receive your results only by: MyChart Message (if you have MyChart) OR A paper copy in the mail If you have any lab test that is abnormal or we need to change your treatment, we will call you to review the results.   Testing/Procedures: - none ordered   Follow-Up: At [facility], you and your health needs are our priority.  As part of our continuing mission to provide you with exceptional heart care, we have created designated Provider Care Teams.  These Care Teams include your primary Cardiologist (physician) and Advanced Practice Providers (APPs -  Physician Assistants and Nurse Practitioners) who all work together to provide you with the care you need, when you need it.  We recommend signing up for the patient portal called "MyChart".  Sign up information is provided on this After Visit Summary.  MyChart is used to connect with patients for Virtual Visits (Telemedicine).  Patients are able to view lab/test results, encounter notes, upcoming appointments, etc.  Non-urgent messages can be sent to your provider as well.   To learn more about what you can do with MyChart, go to [email or web address].    Your next appointment:   3-4 month(s)  The format for your next appointment:   In Person  Provider:   You may see Yvonne Kendall, MD or one of the following Advanced Practice Providers on your designated Care Team:   Nicolasa Ducking, NP    Other Instructions N/a

## 2021-07-16 ENCOUNTER — Other Ambulatory Visit: Payer: Self-pay | Admitting: Nurse Practitioner

## 2021-07-16 ENCOUNTER — Ambulatory Visit
Admission: RE | Admit: 2021-07-16 | Discharge: 2021-07-16 | Disposition: A | Discharge status: Home / Self Care | Payer: Medicaid Other | Source: Ambulatory Visit | Attending: Nurse Practitioner | Admitting: Nurse Practitioner

## 2021-07-16 ENCOUNTER — Ambulatory Visit
Admission: RE | Admit: 2021-07-16 | Discharge: 2021-07-16 | Disposition: A | Discharge status: Home / Self Care | Payer: Medicaid Other | Attending: Nurse Practitioner | Admitting: Nurse Practitioner

## 2021-07-16 DIAGNOSIS — R059 Cough, unspecified: Secondary | ICD-10-CM | POA: Insufficient documentation

## 2021-08-08 ENCOUNTER — Ambulatory Visit: Payer: Medicaid Other | Admitting: Internal Medicine

## 2021-08-11 IMAGING — CR DG CHEST 2V
2 series · 2 of 2 positions shown · non-contrast
Comparison: 08/03/2020.  CTA chest 08/03/2020.

CLINICAL DATA: Body aches and cough.

EXAM:
CHEST - 2 VIEW

[chest pa]
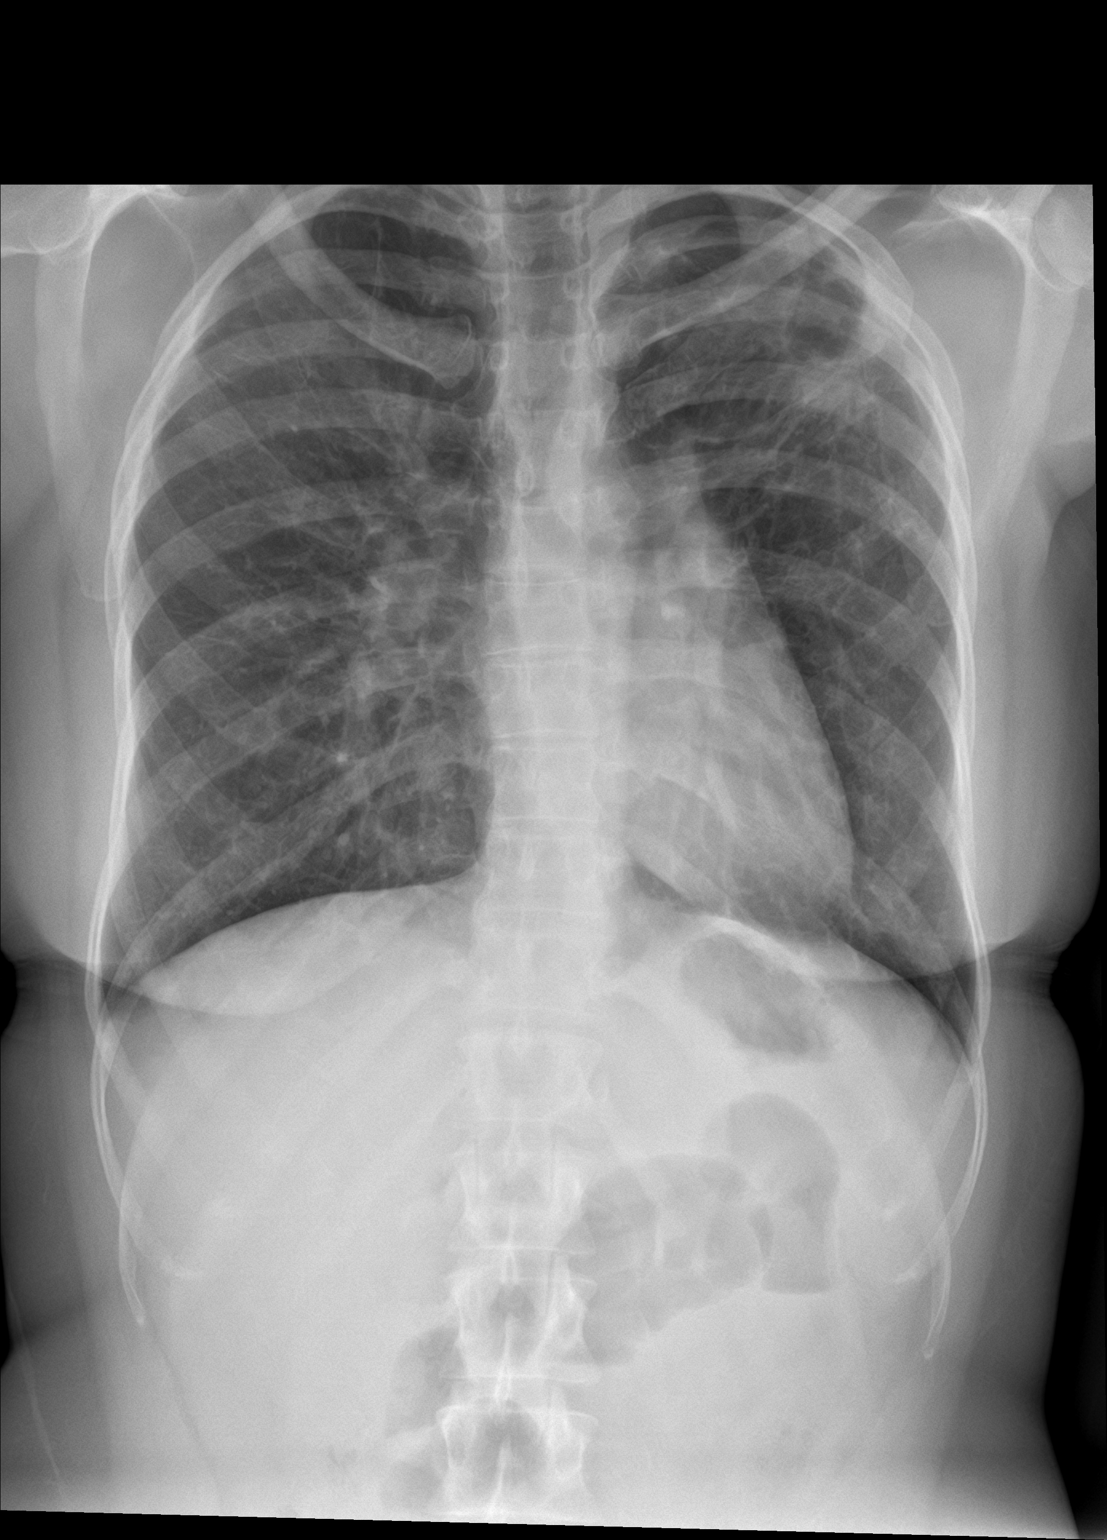

[chest lat]
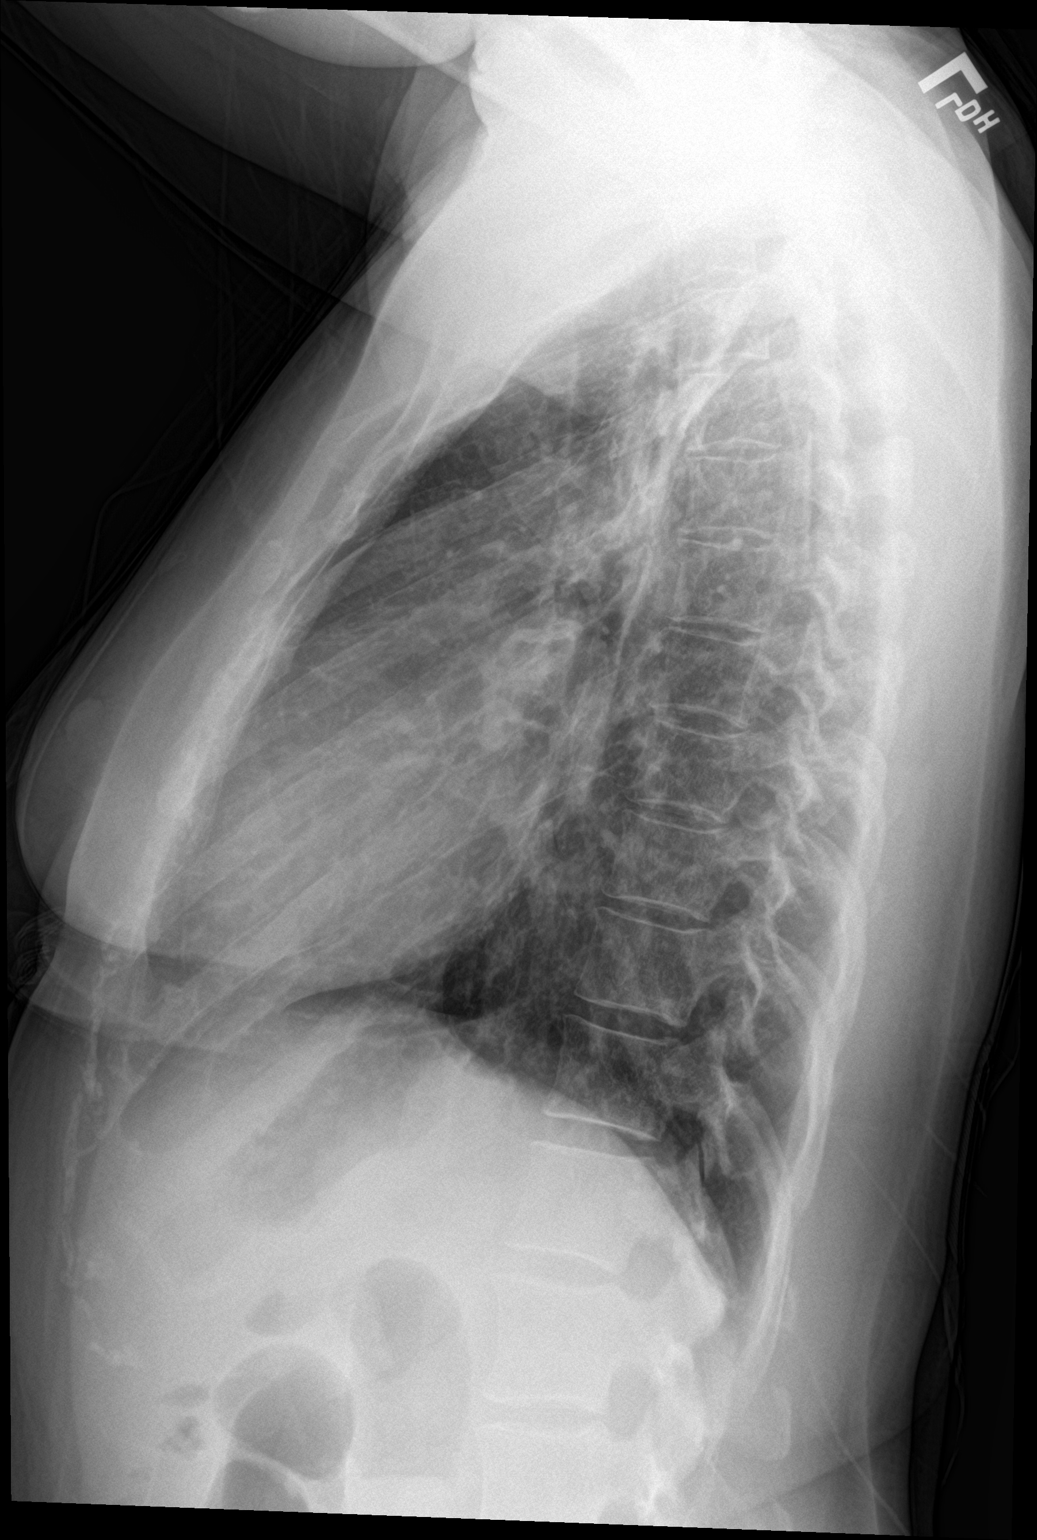

[2 of 2 positions shown; findings below may reference images not displayed]

FINDINGS: Lungs are hyperexpanded. The cardiopericardial silhouette is within
normal limits for size. Interstitial markings are diffusely
coarsened with chronic features. Cavitary lesion in the left apex
again noted, better characterized on previous CT. No pleural
effusion or pneumothorax. The visualized bony structures of the
thorax show no acute abnormality.
IMPRESSION: 1. Stable exam. Cavitary lesion left apex.
2. No new or progressive findings.

## 2021-08-21 ENCOUNTER — Encounter: Payer: Self-pay | Admitting: Internal Medicine

## 2021-08-21 ENCOUNTER — Ambulatory Visit (INDEPENDENT_AMBULATORY_CARE_PROVIDER_SITE_OTHER): Payer: Medicaid Other | Admitting: Internal Medicine

## 2021-08-21 ENCOUNTER — Other Ambulatory Visit: Payer: Self-pay

## 2021-08-21 VITALS — BP 120/78 | HR 70 | Ht <= 58 in | Wt 139.0 lb

## 2021-08-21 DIAGNOSIS — D869 Sarcoidosis, unspecified: Secondary | ICD-10-CM

## 2021-08-21 DIAGNOSIS — I493 Ventricular premature depolarization: Secondary | ICD-10-CM

## 2021-08-21 MED ORDER — BISOPROLOL FUMARATE 10 MG PO TABS: 20.0000 mg | ORAL_TABLET | Freq: Every day | ORAL | 5 refills | Status: DC

## 2021-08-21 NOTE — Progress Notes (Signed)
Follow-up Outpatient Visit Date: 08/21/2021  Primary Care Provider: Judeen Hammans, MD [address] [place] [address]  Chief Complaint: Follow-up PVCs and sarcoidosis  HPI:  Jennifer [name] is a 50 y.o. 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  She was last seen in our office in early September by Ward Givens, NP following transition from metoprolol to bisoprolol due to exertional dyspnea and dizziness reported earlier in the year.  Symptoms were fairly stable at her follow-up visit.  It was noted that her bisoprolol dose timing was quite variable, sometimes going 1.5 days between doses.  She was encouraged to try to take bisoprolol at the same time every day.  No other testing or interventions were pursued at the time.  Today, Jennifer [name] reports that she feels fairly well other than some cold intolerance this time year.  She denies palpitations as well as chest pain and shortness of breath.  She has not had any significant dizziness since her last visit though she overall does not feel much different on bisoprolol compared to when she was on metoprolol.  The timing of her bisoprolol dosing continues to fluctuate from day-to-day.  --------------------------------------------------------------------------------------------------  Cardiovascular History & Procedures: Cardiovascular Problems: Frequent PVCs Atypical chest pain and shortness of breath (history of pulmonary sarcoidosis)   Risk Factors: None   Cath/PCI: None   CV Surgery: None   EP Procedures and Devices: 24-hour Holter monitor (03/26/17): Predominantly sinus rhythm with frequent PVCs (6% burden; previously 13%).  No VT or other sustained arrhythmia. 24-hour Holter monitor (06/29/16): Predominantly sinus rhythm with frequent PVCs and rare PACs. PVC burden was 13%.    Non-Invasive Evaluation(s): TTE (08/04/2020): Normal LV size wall thickness.  LVEF 60-65% with grade 1 diastolic dysfunction.  Normal RV size and function.  Normal biatrial size.  Mild mitral and tricuspid regurgitation.  Normal aortic valve.  Normal CVP. Cardiac MRI (09/05/2020): Normal LV size and wall thickness with low normal LVEF (50%).  No regional wall motion abnormality.  No late gadolinium enhancement to suggest infiltration/scar.  Normal RV size and function.  Normal biatrial size.  Normal aortic root, ascending aorta, and pulmonary artery.  No significant valvular abnormality.  Normal pericardium without effusion. Pharmacologic MPI (08/25/2018): Low risk study without ischemia or scar.  LVEF 62%.  Significant GI uptake noted. TTE (09/20/2017): Normal LV size and wall thickness.  LVEF 55 to 60% with normal wall motion and diastolic function.  Normal RV size and function.  Normal PA pressure. TTE (06/29/16): Normal LV size with LVEF of 50-55%. Normal wall motion and diastolic function. Mild MR. Normal RV size and function. Normal pulmonary artery pressure.  Recent CV Pertinent Labs: Lab Results  Component Value Date   INR 1.1 08/03/2020   K 3.4 (L) 08/20/2020   K 4.0 11/28/2014   MG 2.0 07/29/2020   BUN 13 08/20/2020   BUN 5 (L) 06/16/2018   BUN < 5 (L) 11/28/2014   CREATININE 0.54 08/20/2020   CREATININE 0.67 11/28/2014    Past medical and surgical history were reviewed and updated in EPIC.  Current Meds  Medication Sig   acetaminophen (TYLENOL) 500 MG tablet Take 500-1,000 mg by mouth every 6 (six) hours as needed for mild pain or fever.    ADVAIR DISKUS 250-50 MCG/DOSE AEPB Inhale 1 puff into the lungs 2 (two) times daily.   bisoprolol (ZEBETA) 10 MG tablet  Take 2 tablets (20 mg total) by mouth daily.   divalproex (DEPAKOTE ER) 500 MG 24 hr tablet Take 500 mg by mouth daily.    ibuprofen (ADVIL) 200 MG tablet Take 400-600 mg by mouth every 6 (six) hours as needed for fever  or mild pain.    methimazole (TAPAZOLE) 10 MG tablet Take 30 mg by mouth daily.    omeprazole (PRILOSEC) 40 MG capsule Take 1 capsule (40 mg total) by mouth daily.   ondansetron (ZOFRAN) 4 MG tablet Take 1 tablet (4 mg total) by mouth every 6 (six) hours as needed for nausea or vomiting.   polyethylene glycol powder (GLYCOLAX/MIRALAX) 17 GM/SCOOP powder Take 17 g by mouth as needed.   PROAIR HFA 108 (90 Base) MCG/ACT inhaler Inhale 1 puff into the lungs every 6 (six) hours as needed.   SAPHRIS 5 MG SUBL 24 hr tablet Place 1 tablet (5 mg total) under the tongue daily.   Vitamin D, Cholecalciferol, 25 MCG (1000 UT) TABS Take 500 Units by mouth daily.    Allergies: Penicillin g  Social History   Tobacco Use   Smoking status: Never   Smokeless tobacco: Never  Vaping Use   Vaping Use: Never used  Substance Use Topics   Alcohol use: No    Alcohol/week: 0.0 standard drinks   Drug use: No    Family History  Problem Relation Age of Onset   Lung cancer Mother    Diabetes Sister     Review of Systems: A 12-system review of systems was performed and was negative except as noted in the HPI.  --------------------------------------------------------------------------------------------------  Physical Exam: BP 120/78 (BP Location: Left Arm, Patient Position: Sitting, Cuff Size: Normal)    Pulse 70    Ht 4\' 9"  (1.448 m)    Wt 139 lb (63 kg)    SpO2 98%    BMI 30.08 kg/m   General:  NAD. Neck: No JVD or HJR. Lungs: Clear to auscultation bilaterally without wheezes or crackles. Heart: Regular rate and rhythm without murmurs, rubs, or gallops. Abdomen: Soft, nontender, nondistended. Extremities: No lower extremity edema.  Lab Results  Component Value Date   WBC 6.8 08/20/2020   HGB 13.2 08/20/2020   HCT 41.5 08/20/2020   MCV 75.6 (L) 08/20/2020   PLT 275 08/20/2020    Lab Results  Component Value Date   NA 137 08/20/2020   K 3.4 (L) 08/20/2020   CL 104 08/20/2020   CO2 24  08/20/2020   BUN 13 08/20/2020   CREATININE 0.54 08/20/2020   GLUCOSE 127 (H) 08/20/2020   ALT 14 08/20/2020    No results found for: CHOL, HDL, LDLCALC, LDLDIRECT, TRIG, CHOLHDL  --------------------------------------------------------------------------------------------------  ASSESSMENT AND PLAN: PVCs: Frequent PVCs previously noted, improved with metoprolol.  Jennifer [name] is now on bisoprolol due to some vague fatigue and dizziness on metoprolol.  Overall she feels about the same but not unwell.  We discussed a trial of holding bisoprolol but have agreed to defer this.  We will not make any medication changes today or pursue additional testing at this time.  Of note, prior cardiac MRI did not show evidence of sarcoidosis involvement of the myocardium.  Sarcoidosis: No active symptoms at this time.  I encouraged Jennifer [name] to follow-up with Dr. Stoney Bang.  Follow-up: Return to clinic in 6 months  Nelva Bush, MD 08/21/2021 9:44 AM

## 2021-08-21 NOTE — Patient Instructions (Signed)
Medication Instructions:  ° °Your physician recommends that you continue on your current medications as directed. Please refer to the Current Medication list given to you today. ° °*If you need a refill on your cardiac medications before your next appointment, please call your pharmacy* ° ° °Lab Work: ° °None ordered ° °Testing/Procedures: ° °None ordered ° ° °Follow-Up: °At [facility], you and your health needs are our priority.  As part of our continuing mission to provide you with exceptional heart care, we have created designated Provider Care Teams.  These Care Teams include your primary Cardiologist (physician) and Advanced Practice Providers (APPs -  Physician Assistants and Nurse Practitioners) who all work together to provide you with the care you need, when you need it. ° °We recommend signing up for the patient portal called "MyChart".  Sign up information is provided on this After Visit Summary.  MyChart is used to connect with patients for Virtual Visits (Telemedicine).  Patients are able to view lab/test results, encounter notes, upcoming appointments, etc.  Non-urgent messages can be sent to your provider as well.   °To learn more about what you can do with MyChart, go to [email or web address].   ° °Your next appointment:   °6 month(s) ° °The format for your next appointment:   °In Person ° °Provider:   °You may see Christopher End, MD or one of the following Advanced Practice Providers on your designated Care Team:   °Christopher Berge, NP °Ryan Dunn, PA-C °Cadence Furth, PA-C °

## 2022-02-09 ENCOUNTER — Emergency Department
Admission: EM | Admit: 2022-02-09 | Discharge: 2022-02-09 | Disposition: A | Discharge status: Home / Self Care | Payer: Medicaid Other | Attending: Student in an Organized Health Care Education/Training Program | Admitting: Student in an Organized Health Care Education/Training Program

## 2022-02-09 ENCOUNTER — Other Ambulatory Visit: Payer: Self-pay

## 2022-02-09 ENCOUNTER — Emergency Department: Payer: Medicaid Other

## 2022-02-09 DIAGNOSIS — R600 Localized edema: Secondary | ICD-10-CM | POA: Insufficient documentation

## 2022-02-09 DIAGNOSIS — M25571 Pain in right ankle and joints of right foot: Secondary | ICD-10-CM | POA: Diagnosis present

## 2022-02-09 DIAGNOSIS — Z23 Encounter for immunization: Secondary | ICD-10-CM | POA: Diagnosis not present

## 2022-02-09 DIAGNOSIS — W19XXXA Unspecified fall, initial encounter: Secondary | ICD-10-CM

## 2022-02-09 MED ORDER — TETANUS-DIPHTH-ACELL PERTUSSIS 5-2.5-18.5 LF-MCG/0.5 IM SUSY
0.5000 mL | PREFILLED_SYRINGE | Freq: Once | INTRAMUSCULAR | Status: AC
  Administered 2022-02-09: 0.5 mL via INTRAMUSCULAR
  Filled 2022-02-09: qty 0.5

## 2022-02-09 MED ORDER — IBUPROFEN 400 MG PO TABS
400.0000 mg | ORAL_TABLET | Freq: Once | ORAL | Status: AC | PRN
Start: 2022-02-09 — End: 2022-02-09
  Administered 2022-02-09: 400 mg via ORAL
  Filled 2022-02-09: qty 1

## 2022-02-09 MED ORDER — ACETAMINOPHEN 325 MG PO TABS
650.0000 mg | ORAL_TABLET | Freq: Once | ORAL | Status: AC
  Administered 2022-02-09: 650 mg via ORAL
  Filled 2022-02-09: qty 2

## 2022-02-09 NOTE — ED Provider Notes (Signed)
[facility] [place] Provider Note  Patient Contact: 7:02 PM (approximate)   History   Fall   HPI  Jennifer [name] is a 51 y.o. female presents to the emergency department after patient had a mechanical fall while walking outside her library.  Patient denies hitting her head and is primarily complaining of right ankle pain.  No numbness or tingling in the upper and lower extremities.  No chest pain, chest tightness or abdominal pain.      Physical Exam   Triage Vital Signs: ED Triage Vitals  Enc Vitals Group     BP 02/09/22 1603 (!) 142/77     Pulse Rate 02/09/22 1603 (!) 101     Resp 02/09/22 1603 16     Temp 02/09/22 1603 98.6 F (37 C)     Temp Source 02/09/22 1603 Oral     SpO2 02/09/22 1603 94 %     Weight 02/09/22 1612 147 lb (66.7 kg)     Height 02/09/22 1612 4\' 9"  (1.448 m)     Head Circumference --      Peak Flow --      Pain Score 02/09/22 1612 10     Pain Loc --      Pain Edu? --      Excl. in GC? --     Most recent vital signs: Vitals:   02/09/22 1603  BP: (!) 142/77  Pulse: (!) 101  Resp: 16  Temp: 98.6 F (37 C)  SpO2: 94%     General: Alert and in no acute distress. Eyes:  PERRL. EOMI. Head: No acute traumatic findings ENT:      Ears:       Nose: No congestion/rhinnorhea.      Mouth/Throat: Mucous membranes are moist. Neck: No stridor. No cervical spine tenderness to palpation. Cardiovascular:  Good peripheral perfusion Respiratory: Normal respiratory effort without tachypnea or retractions. Lungs CTAB. Good air entry to the bases with no decreased or absent breath sounds. Gastrointestinal: Bowel sounds 4 quadrants. Soft and nontender to palpation. No guarding or rigidity. No palpable masses. No distention. No CVA tenderness. Musculoskeletal: Patient performs full range of motion of the bilateral knees.  Patient has moderate edema right ankle.  She can move all 5 right toes.  Palpable dorsalis pedis pulse, right.   Capillary refill less than 2 seconds on the right. Neurologic:  No gross focal neurologic deficits are appreciated.  Skin:   No rash noted Other:   ED Results / Procedures / Treatments   Labs (all labs ordered are listed, but only abnormal results are displayed) Labs Reviewed - No data to display     RADIOLOGY  I personally viewed and evaluated these images as part of my medical decision making, as well as reviewing the written report by the radiologist.  ED Provider Interpretation: I personally interpreted x-ray of the right ankle and there was no acute bony abnormality visualized.   PROCEDURES:  Critical Care performed: No  Procedures   MEDICATIONS ORDERED IN ED: Medications  Tdap (BOOSTRIX) injection 0.5 mL (has no administration in time range)  acetaminophen (TYLENOL) tablet 650 mg (has no administration in time range)  ibuprofen (ADVIL) tablet 400 mg (400 mg Oral Given 02/09/22 1619)     IMPRESSION / MDM / ASSESSMENT AND PLAN / ED COURSE  I reviewed the triage vital signs and the nursing notes.  Assessment and plan: Ankle pain.  Differential diagnosis and includes ankle sprain versus fracture 51 year old female presents to the emergency department with acute right ankle pain.  Patient has no acute bony abnormality visualized on x-ray.  An Ace wrap was applied and patient was advised to follow-up with primary care as needed.      FINAL CLINICAL IMPRESSION(S) / ED DIAGNOSES   Final diagnoses:  Fall, initial encounter     Rx / DC Orders   ED Discharge Orders     None        Note:  This document was prepared using Dragon voice recognition software and may include unintentional dictation errors.   Pia Mau [place], PA-C 02/09/22 Ebony Cargo    Willy Eddy, MD 02/09/22 [id number]

## 2022-02-09 NOTE — ED Triage Notes (Signed)
Pt via POV from home. Pt states today she was in the parking lot and tripped on the curb, pt landed on her L knee, then twisted her R ankle and scrapped the R knee. Pt as abrasions to bilateral knees. Also noted swelling to the R ankle. Pt is A&OX4 and NAD

## 2022-02-09 NOTE — Discharge Instructions (Addendum)
You can take 650 mg of Tylenol every 6 hours as needed for pain. You can apply mupirocin to abrasions twice daily.

## 2022-02-16 DIAGNOSIS — S8001XA Contusion of right knee, initial encounter: Secondary | ICD-10-CM | POA: Insufficient documentation

## 2022-02-16 DIAGNOSIS — M25571 Pain in right ankle and joints of right foot: Secondary | ICD-10-CM | POA: Insufficient documentation

## 2022-02-16 DIAGNOSIS — M25561 Pain in right knee: Secondary | ICD-10-CM | POA: Insufficient documentation

## 2022-02-16 DIAGNOSIS — S8002XA Contusion of left knee, initial encounter: Secondary | ICD-10-CM | POA: Insufficient documentation

## 2022-02-16 DIAGNOSIS — S93401A Sprain of unspecified ligament of right ankle, initial encounter: Secondary | ICD-10-CM | POA: Insufficient documentation

## 2022-02-16 DIAGNOSIS — M797 Fibromyalgia: Secondary | ICD-10-CM | POA: Insufficient documentation

## 2022-02-16 DIAGNOSIS — M79671 Pain in right foot: Secondary | ICD-10-CM | POA: Insufficient documentation

## 2022-02-17 ENCOUNTER — Telehealth: Payer: Self-pay

## 2022-02-17 NOTE — Telephone Encounter (Signed)
Spoke with Patty at [facility] to get approval of bisoprolol 10 mg tablets (2 tablets by mouth daily) Approval #[id number] Effective 02/17/2022 through 02/12/2023 Call interaction ID# [id number]

## 2022-02-19 ENCOUNTER — Ambulatory Visit: Payer: Medicaid Other | Admitting: Internal Medicine

## 2022-02-19 NOTE — Progress Notes (Deleted)
Follow-up Outpatient Visit Date: 02/19/2022  Primary Care Provider: Judeen Hammans, MD [address] [place] [address]  Chief Complaint: ***  HPI:  Ms. Jennifer [name] is a 51 y.o. 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  I last saw her in 08/2021, at which time she was feeling well other than cold intolerance.  She was previously switched from metoprolol to bisoprolol due to fatigue, though this did not seem to make much difference.  We deferred medication changes and further testing at our last visit.  --------------------------------------------------------------------------------------------------  Cardiovascular History & Procedures: Cardiovascular Problems: Frequent PVCs Atypical chest pain and shortness of breath (history of pulmonary sarcoidosis)   Risk Factors: None   Cath/PCI: None   CV Surgery: None   EP Procedures and Devices: 24-hour Holter monitor (03/26/17): Predominantly sinus rhythm with frequent PVCs (6% burden; previously 13%).  No VT or other sustained arrhythmia. 24-hour Holter monitor (06/29/16): Predominantly sinus rhythm with frequent PVCs and rare PACs. PVC burden was 13%.   Non-Invasive Evaluation(s): TTE (08/04/2020): Normal LV size wall thickness.  LVEF 60-65% with grade 1 diastolic dysfunction.  Normal RV size and function.  Normal biatrial size.  Mild mitral and tricuspid regurgitation.  Normal aortic valve.  Normal CVP. Cardiac MRI (09/05/2020): Normal LV size and wall thickness with low normal LVEF (50%).  No regional wall motion abnormality.  No late gadolinium enhancement to suggest infiltration/scar.  Normal RV size and function.  Normal biatrial size.  Normal aortic root, ascending aorta, and pulmonary artery.  No significant valvular abnormality.  Normal pericardium without effusion. Pharmacologic MPI  (08/25/2018): Low risk study without ischemia or scar.  LVEF 62%.  Significant GI uptake noted. TTE (09/20/2017): Normal LV size and wall thickness.  LVEF 55 to 60% with normal wall motion and diastolic function.  Normal RV size and function.  Normal PA pressure. TTE (06/29/16): Normal LV size with LVEF of 50-55%. Normal wall motion and diastolic function. Mild MR. Normal RV size and function. Normal pulmonary artery pressure.  Recent CV Pertinent Labs: Lab Results  Component Value Date   INR 1.1 08/03/2020   K 3.4 (L) 08/20/2020   K 4.0 11/28/2014   MG 2.0 07/29/2020   BUN 13 08/20/2020   BUN 5 (L) 06/16/2018   BUN < 5 (L) 11/28/2014   CREATININE 0.54 08/20/2020   CREATININE 0.67 11/28/2014    Past medical and surgical history were reviewed and updated in EPIC.  No outpatient medications have been marked as taking for the 02/19/22 encounter (Appointment) with Wojciech Willetts, Cristal Deer, MD.    Allergies: Penicillin g  Social History   Tobacco Use   Smoking status: Never   Smokeless tobacco: Never  Vaping Use   Vaping Use: Never used  Substance Use Topics   Alcohol use: No    Alcohol/week: 0.0 standard drinks of alcohol   Drug use: No    Family History  Problem Relation Age of Onset   Lung cancer Mother    Diabetes Sister     Review of Systems: A 12-system review of systems was performed and was negative except as noted in the HPI.  --------------------------------------------------------------------------------------------------  Physical Exam: There were no vitals taken for this visit.  General:  NAD. Neck: No JVD or HJR. Lungs: Clear to auscultation bilaterally without wheezes or crackles. Heart: Regular rate and rhythm without murmurs, rubs, or gallops. Abdomen: Soft, nontender, nondistended. Extremities:  No lower extremity edema.  EKG:  ***  Lab Results  Component Value Date   WBC 6.8 08/20/2020   HGB 13.2 08/20/2020   HCT 41.5 08/20/2020   MCV 75.6 (L)  08/20/2020   PLT 275 08/20/2020    Lab Results  Component Value Date   NA 137 08/20/2020   K 3.4 (L) 08/20/2020   CL 104 08/20/2020   CO2 24 08/20/2020   BUN 13 08/20/2020   CREATININE 0.54 08/20/2020   GLUCOSE 127 (H) 08/20/2020   ALT 14 08/20/2020    No results found for: "CHOL", "HDL", "LDLCALC", "LDLDIRECT", "TRIG", "CHOLHDL"  --------------------------------------------------------------------------------------------------  ASSESSMENT AND PLAN: Yvonne Kendall, MD 02/19/2022 7:05 AM

## 2022-02-20 ENCOUNTER — Encounter: Payer: Self-pay | Admitting: Internal Medicine

## 2022-03-02 DIAGNOSIS — M722 Plantar fascial fibromatosis: Secondary | ICD-10-CM | POA: Insufficient documentation

## 2022-03-13 ENCOUNTER — Ambulatory Visit: Payer: Medicaid Other | Admitting: Primary Care

## 2022-03-13 NOTE — Progress Notes (Deleted)
@Patient  ID: , female    DOB: [birth year], 51 y.o.   MRN: [id number]  No chief complaint on file.   Referring provider: [id number], MD  HPI: 51 year old female, never smoked. 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  Patient of Dr. 44, last seen in office on 02/26/2021.  Previous history of aspergilloma Previously had voriconazole therapy early in 2016 but developed a fungus ball on CT chest ID has taken her off voriconazole  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 With left upper lobe pulmonary cavitary lesion with soft intramural tissue mass  Previous CT chest 2017 B/l upper lobe predominant scarring, ILD LUL cavitary lesion   PET scan to 2/22 Low-level hypermetabolic state Low likelihood of malignancy  03/13/2022- interim hx  Patient presents today for overdue 6 month follow-up. She was last seen in June 2022 by Dr. July 2022.          Allergies  Allergen Reactions   Penicillin G Hives    Immunization History  Administered Date(s) Administered   Influenza Split 08/12/2015   Influenza,inj,Quad PF,6+ Mos 05/29/2019   Influenza-Unspecified 07/08/2017, 05/29/2019, 05/13/2020   PFIZER(Purple Top)SARS-COV-2 Vaccination 11/27/2019, 12/18/2019   Tdap 02/09/2022    Past Medical History:  Diagnosis Date   Anxiety    Asthma    Chest pain    a. 08/2018 MV: EF 62%, no ischemia/scar.   Depression    Diastolic dysfunction    a. 09/2017 Echo: Nl LV size/fxn/wall thickness; b. 07/2020 Echo: EF 60-65%, no rwma, Gr1 DD. Nl RV size/fxn. Mild MR.   Fibromyalgia    Hyperthyroidism    Incontinence    Lymphadenopathy    Pneumonia    bilateral community acquired    PVC's (premature ventricular contractions)    a. 06/2016 24h Holter: Sinus rhythm, freq PVCs (13% burden), rare PACs; b. 03/2017 24h Holter: Predominantly sinus rhythm. Freq PVCs (6% burden); c. 08/2020 cMRI: EF 50%. Nl LV size/thickness. No  LGE to suggest scar/infiltration. Nl RV size/fxn. Nl biatrial sizes. No significant valvular abnormalities.   Sarcoidosis    Sepsis (HCC)    Shortness of breath dyspnea    Tachycardia     Tobacco History: Social History   Tobacco Use  Smoking Status Never  Smokeless Tobacco Never   Counseling given: Not Answered   Outpatient Medications Prior to Visit  Medication Sig Dispense Refill   acetaminophen (TYLENOL) 500 MG tablet Take 500-1,000 mg by mouth every 6 (six) hours as needed for mild pain or fever.      ADVAIR DISKUS 250-50 MCG/DOSE AEPB Inhale 1 puff into the lungs 2 (two) times daily. 60 each 5   bisoprolol (ZEBETA) 10 MG tablet Take 2 tablets (20 mg total) by mouth daily. 60 tablet 5   divalproex (DEPAKOTE ER) 500 MG 24 hr tablet Take 500 mg by mouth daily.      ibuprofen (ADVIL) 200 MG tablet Take 400-600 mg by mouth every 6 (six) hours as needed for fever or mild pain.      methimazole (TAPAZOLE) 10 MG tablet Take 30 mg by mouth daily.      omeprazole (PRILOSEC) 40 MG capsule Take 1 capsule (40 mg total) by mouth daily. 90 capsule 3   ondansetron (ZOFRAN) 4 MG tablet Take 1 tablet (4 mg total) by mouth every 6 (six) hours as needed for nausea or vomiting. 20 tablet 1   polyethylene glycol powder (GLYCOLAX/MIRALAX) 17 GM/SCOOP powder Take 17 g by mouth as needed. 255  g 0   PROAIR HFA 108 (90 Base) MCG/ACT inhaler Inhale 1 puff into the lungs every 6 (six) hours as needed. 18 g 5   SAPHRIS 5 MG SUBL 24 hr tablet Place 1 tablet (5 mg total) under the tongue daily. 60 tablet 0   Vitamin D, Cholecalciferol, 25 MCG (1000 UT) TABS Take 500 Units by mouth daily. 60 tablet    No facility-administered medications prior to visit.      Review of Systems  Review of Systems   Physical Exam  There were no vitals taken for this visit. Physical Exam   Lab Results:  CBC    Component Value Date/Time   WBC 6.8 08/20/2020 1035   RBC 5.49 (H) 08/20/2020 1035   HGB 13.2  08/20/2020 1035   HGB 13.0 06/16/2018 1118   HCT 41.5 08/20/2020 1035   HCT 39.8 06/16/2018 1118   PLT 275 08/20/2020 1035   PLT 284 06/16/2018 1118   MCV 75.6 (L) 08/20/2020 1035   MCV 73 (L) 06/16/2018 1118   MCV 77 (L) 11/28/2014 0715   MCH 24.0 (L) 08/20/2020 1035   MCHC 31.8 08/20/2020 1035   RDW 15.6 (H) 08/20/2020 1035   RDW 13.3 06/16/2018 1118   RDW 13.4 11/28/2014 0715   LYMPHSABS 2.2 08/20/2020 1035   LYMPHSABS 0.8 06/16/2018 1118   LYMPHSABS 0.6 (L) 11/28/2014 0715   MONOABS 0.8 08/20/2020 1035   MONOABS 0.7 11/28/2014 0715   EOSABS 0.1 08/20/2020 1035   EOSABS 0.1 06/16/2018 1118   EOSABS 0.1 11/28/2014 0715   BASOSABS 0.0 08/20/2020 1035   BASOSABS 0.0 06/16/2018 1118   BASOSABS 0.0 11/28/2014 0715    BMET    Component Value Date/Time   NA 137 08/20/2020 1035   NA 137 06/16/2018 1118   NA 139 11/28/2014 0715   K 3.4 (L) 08/20/2020 1035   K 4.0 11/28/2014 0715   CL 104 08/20/2020 1035   CL 104 11/28/2014 0715   CO2 24 08/20/2020 1035   CO2 28 11/28/2014 0715   GLUCOSE 127 (H) 08/20/2020 1035   GLUCOSE 102 (H) 11/28/2014 0715   BUN 13 08/20/2020 1035   BUN 5 (L) 06/16/2018 1118   BUN < 5 (L) 11/28/2014 0715   CREATININE 0.54 08/20/2020 1035   CREATININE 0.67 11/28/2014 0715   CALCIUM 8.9 08/20/2020 1035   CALCIUM 8.7 (L) 11/28/2014 0715   GFRNONAA >60 08/20/2020 1035   GFRNONAA >60 11/28/2014 0715   GFRAA >60 07/14/2018 1057   GFRAA >60 11/28/2014 0715    BNP No results found for: "BNP"  ProBNP No results found for: "PROBNP"  Imaging: No results found.   Assessment & Plan:   No problem-specific Assessment & Plan notes found for this encounter.     Glenford Bayley, NP 03/13/2022

## 2022-04-18 ENCOUNTER — Other Ambulatory Visit: Payer: Self-pay

## 2022-04-18 ENCOUNTER — Encounter: Payer: Self-pay | Admitting: Emergency Medicine

## 2022-04-18 ENCOUNTER — Emergency Department
Admission: EM | Admit: 2022-04-18 | Discharge: 2022-04-18 | Disposition: A | Discharge status: Home / Self Care | Payer: Medicaid Other | Attending: Emergency Medicine | Admitting: Emergency Medicine

## 2022-04-18 ENCOUNTER — Emergency Department: Payer: Medicaid Other

## 2022-04-18 DIAGNOSIS — Z20822 Contact with and (suspected) exposure to covid-19: Secondary | ICD-10-CM | POA: Insufficient documentation

## 2022-04-18 DIAGNOSIS — E039 Hypothyroidism, unspecified: Secondary | ICD-10-CM | POA: Diagnosis not present

## 2022-04-18 DIAGNOSIS — R5383 Other fatigue: Secondary | ICD-10-CM | POA: Diagnosis not present

## 2022-04-18 DIAGNOSIS — R519 Headache, unspecified: Secondary | ICD-10-CM | POA: Diagnosis not present

## 2022-04-18 DIAGNOSIS — R5381 Other malaise: Secondary | ICD-10-CM | POA: Insufficient documentation

## 2022-04-18 DIAGNOSIS — R350 Frequency of micturition: Secondary | ICD-10-CM | POA: Diagnosis not present

## 2022-04-18 LAB — URINALYSIS, ROUTINE W REFLEX MICROSCOPIC
Bilirubin Urine: NEGATIVE
Glucose, UA: NEGATIVE mg/dL
Hgb urine dipstick: NEGATIVE
Ketones, ur: NEGATIVE mg/dL
Leukocytes,Ua: NEGATIVE
Nitrite: NEGATIVE
Protein, ur: NEGATIVE mg/dL
Specific Gravity, Urine: 1 — ABNORMAL LOW (ref 1.005–1.030)
pH: 6 (ref 5.0–8.0)

## 2022-04-18 LAB — CBC
HCT: 41.7 % (ref 36.0–46.0)
Hemoglobin: 13.2 g/dL (ref 12.0–15.0)
MCH: 25 pg — ABNORMAL LOW (ref 26.0–34.0)
MCHC: 31.7 g/dL (ref 30.0–36.0)
MCV: 79 fL — ABNORMAL LOW (ref 80.0–100.0)
Platelets: 312 10*3/uL (ref 150–400)
RBC: 5.28 MIL/uL — ABNORMAL HIGH (ref 3.87–5.11)
RDW: 13.1 % (ref 11.5–15.5)
WBC: 5.1 10*3/uL (ref 4.0–10.5)
nRBC: 0 % (ref 0.0–0.2)

## 2022-04-18 LAB — T4, FREE: Free T4: 0.79 ng/dL (ref 0.61–1.12)

## 2022-04-18 LAB — BASIC METABOLIC PANEL
Anion gap: 6 (ref 5–15)
BUN: 13 mg/dL (ref 6–20)
CO2: 26 mmol/L (ref 22–32)
Calcium: 9.2 mg/dL (ref 8.9–10.3)
Chloride: 101 mmol/L (ref 98–111)
Creatinine, Ser: 0.91 mg/dL (ref 0.44–1.00)
GFR, Estimated: >60 mL/min (ref >60)
Glucose, Bld: 130 mg/dL — ABNORMAL HIGH (ref 70–99)
Potassium: 3.8 mmol/L (ref 3.5–5.1)
Sodium: 133 mmol/L — ABNORMAL LOW (ref 135–145)

## 2022-04-18 LAB — TSH: TSH: 4.652 u[IU]/mL — ABNORMAL HIGH (ref 0.350–4.500)

## 2022-04-18 LAB — SARS CORONAVIRUS 2 BY RT PCR: SARS Coronavirus 2 by RT PCR: NEGATIVE

## 2022-04-18 NOTE — ED Provider Notes (Signed)
[facility] Provider Note    Event Date/Time   First MD Initiated Contact with Patient 04/18/22 [phone]     (approximate)   History   Headache and Urinary Frequency   HPI  Jennifer [name] is a 51 y.o. female who reports a history of hypothyroidism on Synthroid.  She presents for evaluation of a constellation of symptoms that have been present for the last 2 weeks.  This includes "just feeling bad", persistent headache, feeling hot at times, and having increased urinary frequency.  She is not having any burning when she urinates or foul-smelling urine.  She says she is eating and drinking normally.  She has had no chest pain or shortness of breath.  She has had no visible rash, sore throat, nasal congestion, nor cough.  She says she just does not feel right and is worried it might be her thyroid hormone levels.     Physical Exam   Triage Vital Signs: ED Triage Vitals  Enc Vitals Group     BP 04/18/22 0326 (!) 154/88     Pulse Rate 04/18/22 0326 79     Resp 04/18/22 0326 16     Temp 04/18/22 0326 97.9 F (36.6 C)     Temp Source 04/18/22 0326 Oral     SpO2 04/18/22 0326 99 %     Weight --      Height --      Head Circumference --      Peak Flow --      Pain Score 04/18/22 0327 8     Pain Loc --      Pain Edu? --      Excl. in GC? --     Most recent vital signs: Vitals:   04/18/22 0600 04/18/22 0700  BP: 110/75 121/79  Pulse: 65 62  Resp: 17   Temp:    SpO2: 98% 98%     General: Awake, no distress.  Generally well-appearing. CV:  Good peripheral perfusion.  Normal heart sounds.  Regular rate and rhythm. Resp:  Normal effort.  Lungs are clear to auscultation bilaterally. Abd:  No distention.  No tenderness to palpation of the abdomen. Other:  Mood and affect are generally appropriate under the circumstances.   ED Results / Procedures / Treatments   Labs (all labs ordered are listed, but only abnormal results are displayed) Labs  Reviewed  URINALYSIS, ROUTINE W REFLEX MICROSCOPIC - Abnormal; Notable for the following components:      Result Value   Color, Urine COLORLESS (*)    APPearance CLEAR (*)    Specific Gravity, Urine 1.000 (*)    All other components within normal limits  BASIC METABOLIC PANEL - Abnormal; Notable for the following components:   Sodium 133 (*)    Glucose, Bld 130 (*)    All other components within normal limits  CBC - Abnormal; Notable for the following components:   RBC 5.28 (*)    MCV 79.0 (*)    MCH 25.0 (*)    All other components within normal limits  TSH - Abnormal; Notable for the following components:   TSH 4.652 (*)    All other components within normal limits  SARS CORONAVIRUS 2 BY RT PCR  T4, FREE     RADIOLOGY I viewed and interpreted the patient's head CT.  I see no evidence of neoplasm or acute intracranial bleed.  I also read the radiologist's report, which confirmed no acute findings.  PROCEDURES:  Critical Care performed: No  Procedures   MEDICATIONS ORDERED IN ED: Medications - No data to display   IMPRESSION / MDM / ASSESSMENT AND PLAN / ED COURSE  I reviewed the triage vital signs and the nursing notes.                              Differential diagnosis includes, but is not limited to, intracranial neoplasm, electrolyte or metabolic abnormality, viral infection such as COVID-19, urinary tract infection, new onset diabetes, anemia.  Patient's presentation is most consistent with acute presentation with potential threat to life or bodily function.  However, the patient's evaluation has been reassuring.  Vital signs are stable and within normal limits.  She is not tachycardic and is having no blood pressure control issues.  Labs/studies ordered include CBC, urinalysis, BMP, and COVID-19 PCR.  I also added on a TSH and free T4 after talking with the patient about her hormone level concerns.  All of her labs are within normal limits other than her  TSH which is elevated, but she is on Synthroid and her free T4 level is normal.  I ordered her CT head given the persistent headache and general malaise, mostly to rule out neoplasm, but also to evaluate for the possibility of a subacute or chronic subdural hematoma or other intracranial hemorrhage.  This study also did not reveal any acute abnormalities.  All in all, the patient had a very reassuring emergency work-up.  I explained to the patient that there was no indication she needed hospitalization.  I encouraged her to follow-up with her PCP for the symptoms that been continuing for about 2 weeks.  She said that she understands and will do so.  I gave my usual and customary return precautions.    Clinical Course as of 04/18/22 0748  Sat Apr 18, 2022  0708 CT Head Wo Contrast I viewed and interpreted the patient's head CT without contrast.  I see no evidence of neoplasm or intracranial bleed.  I also read the radiologist's report, which confirmed no acute findings.  Patient's TSH is elevated as anticipated given her history of hypothyroidism, but her free T4 is within normal limits.  At this point there is no evidence of an acute or emergent medical condition.  She can follow-up with her primary care doctor.  I provided the reassuring information to the patient and gave my usual and customary return precautions.  She understands and agrees with the plan. [CF]    Clinical Course User Index [CF] Loleta Rose, MD     FINAL CLINICAL IMPRESSION(S) / ED DIAGNOSES   Final diagnoses:  Malaise and fatigue  Generalized headache     Rx / DC Orders   ED Discharge Orders     None        Note:  This document was prepared using Dragon voice recognition software and may include unintentional dictation errors.   Loleta Rose, MD 04/18/22 [phone]

## 2022-04-18 NOTE — ED Triage Notes (Signed)
Pt to ED from home c/o headache, feeling hot, and urinary frequency for two weeks.  Denies hematuria or burning with urination.  Is drinking water regularly.  Pt A&Ox4, chest rise even and unlabored, skin WNL and in NAD at this time.

## 2022-04-18 NOTE — Discharge Instructions (Signed)
Your workup in the Emergency Department today was reassuring.  We did not find any specific abnormalities.  We recommend you drink plenty of fluids, take your regular medications and/or any new ones prescribed today, and follow up with the doctor(s) listed in these documents as recommended.  Return to the Emergency Department if you develop new or worsening symptoms that concern you.  

## 2022-06-25 ENCOUNTER — Other Ambulatory Visit: Payer: Self-pay | Admitting: Physician Assistant

## 2022-06-25 DIAGNOSIS — Z1231 Encounter for screening mammogram for malignant neoplasm of breast: Secondary | ICD-10-CM

## 2022-07-01 ENCOUNTER — Other Ambulatory Visit: Payer: Self-pay | Admitting: Family Medicine

## 2022-07-01 DIAGNOSIS — N6489 Other specified disorders of breast: Secondary | ICD-10-CM

## 2022-07-23 ENCOUNTER — Ambulatory Visit
Admission: RE | Admit: 2022-07-23 | Discharge: 2022-07-23 | Disposition: A | Discharge status: Home / Self Care | Payer: Medicaid Other | Source: Ambulatory Visit | Attending: Family Medicine | Admitting: Family Medicine

## 2022-07-23 DIAGNOSIS — N6489 Other specified disorders of breast: Secondary | ICD-10-CM | POA: Insufficient documentation

## 2022-09-11 ENCOUNTER — Other Ambulatory Visit (HOSPITAL_COMMUNITY)
Admission: RE | Admit: 2022-09-11 | Discharge: 2022-09-11 | Disposition: A | Discharge status: Home / Self Care | Payer: Medicaid Other | Source: Ambulatory Visit | Attending: Obstetrics & Gynecology | Admitting: Obstetrics & Gynecology

## 2022-09-11 ENCOUNTER — Ambulatory Visit (INDEPENDENT_AMBULATORY_CARE_PROVIDER_SITE_OTHER): Payer: Medicaid Other | Admitting: Obstetrics & Gynecology

## 2022-09-11 ENCOUNTER — Encounter: Payer: Self-pay | Admitting: Obstetrics & Gynecology

## 2022-09-11 VITALS — BP 100/60 | Ht <= 58 in | Wt 137.0 lb

## 2022-09-11 DIAGNOSIS — N870 Mild cervical dysplasia: Secondary | ICD-10-CM

## 2022-09-11 DIAGNOSIS — B977 Papillomavirus as the cause of diseases classified elsewhere: Secondary | ICD-10-CM | POA: Diagnosis present

## 2022-09-11 DIAGNOSIS — R87612 Low grade squamous intraepithelial lesion on cytologic smear of cervix (LGSIL): Secondary | ICD-10-CM | POA: Insufficient documentation

## 2022-09-11 DIAGNOSIS — R87810 Cervical high risk human papillomavirus (HPV) DNA test positive: Secondary | ICD-10-CM | POA: Diagnosis not present

## 2022-09-11 NOTE — Progress Notes (Signed)
   Established Patient Office Visit  Subjective   Patient ID: Jennifer [name], female    DOB: [birth year]  Age: 52 y.o. MRN: [id number]  Chief Complaint  Patient presents with   Colposcopy    HPI    52 yo P2 here for a colpo, referred from [facility], for a + HR HPV pap. This is her second pap with this finding. She can't recall any treatments to her cervix. Both of her children were born via cesarean section. She is not currently sexually active.  Objective:     BP 100/60   Ht 4\' 9"  (1.448 m)   Wt 137 lb (62.1 kg)   BMI 29.65 kg/m    Physical Exam  Well nourished, well hydrated Black female, no apparent distress She is ambulating and conversing normally. Consent signed, time out done Cervix prepped with acetic acid. Transformation zone seen in its entirety. Colpo adequate. 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 She tolerated the procedure well.  Assessment & Plan:   Problem List Items Addressed This Visit   None Visit Diagnoses     High risk HPV infection    -  Primary   Relevant Orders   Colposcopy   Surgical pathology     As long as the ECC is benign, then she will need another pap in a year. I discussed a LEEP if the ECC is abnormal. She prefers a phone call versus Mychart message.  No follow-ups on file.    Emily Filbert, MD

## 2022-09-14 ENCOUNTER — Encounter: Payer: Self-pay | Admitting: Obstetrics & Gynecology

## 2022-09-14 LAB — SURGICAL PATHOLOGY

## 2022-09-16 ENCOUNTER — Telehealth: Payer: Self-pay | Admitting: Obstetrics & Gynecology

## 2022-09-16 NOTE — Telephone Encounter (Signed)
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 She will call the office to schedule this.

## 2022-10-13 ENCOUNTER — Encounter: Payer: Self-pay | Admitting: Obstetrics & Gynecology

## 2022-10-13 ENCOUNTER — Ambulatory Visit (INDEPENDENT_AMBULATORY_CARE_PROVIDER_SITE_OTHER): Payer: Medicaid Other | Admitting: Obstetrics & Gynecology

## 2022-10-13 ENCOUNTER — Other Ambulatory Visit (HOSPITAL_COMMUNITY)
Admission: RE | Admit: 2022-10-13 | Discharge: 2022-10-13 | Disposition: A | Discharge status: Home / Self Care | Payer: Medicaid Other | Source: Ambulatory Visit | Attending: Obstetrics & Gynecology | Admitting: Obstetrics & Gynecology

## 2022-10-13 VITALS — BP 90/60 | Ht <= 58 in | Wt 138.0 lb

## 2022-10-13 DIAGNOSIS — N870 Mild cervical dysplasia: Secondary | ICD-10-CM | POA: Diagnosis not present

## 2022-10-13 NOTE — Progress Notes (Signed)
   Established Patient Office Visit  Subjective   Patient ID: Jennifer [name], female    DOB: [birth year]  Age: 52 y.o. MRN: [id number]  Chief Complaint  Patient presents with   Procedure    HPI    52 yo single P2 here for a LEEP. I saw her last month and did a colpo for recurrent HR HPV pap smears (from [facility]). Her colpo was normal, but the ECC showed LGSIL. She is not sexually active.  Objective:     BP 90/60   Ht 4\' 9"  (1.448 m)   Wt 138 lb (62.6 kg)   BMI 29.86 kg/m    Physical Exam   Well nourished, well hydrated Black female, no apparent distress  Colpo Biopsy:   Risks, benefits, alternatives, and limitations of procedure explained to patient, including pain, bleeding, infection, failure to remove abnormal tissue and failure to cure dysplasia, need for repeat procedures, damage to pelvic organs, cervical incompetence.  Role of HPV,cervical dysplasia and need for close followup was empasized. Informed written consent was obtained. All questions were answered. Time out performed. Urine pregnancy test was negative.  ??Procedure: The patient was placed in lithotomy position and the bivalved coated speculum was placed in the patient's vagina. A grounding pad placed on the patient. Acetic acid was applied to the cervix and all appeared normal.  Hurricaine spray was applied to the cervix. Local anesthesia was administered via an intracervical block using 15cc of 2% Lidocaine with epinephrine. The suction was turned on and the medium semicircular LEEP tip on 22 Watts of cutting current and cautery was used to excise the entire transformation zone and any areas of visible dysplasia. I obtained an ECC.  Excellent hemostasis was achieved using roller ball coagulation set at 50 Watts coagulation current. As per my usual, I coated the cone bed with Monsel's. The speculum was removed from the vagina. Specimens were sent to pathology.  ?The patient tolerated the  procedure well. Post-operative instructions given to patient, including instruction to seek medical attention for persistent bright red bleeding, fever, abdominal/pelvic pain, dysuria, nausea or vomiting. She was also told about the possibility of having copious yellow to black tinged discharge for weeks. She was counseled to avoid anything in the vagina (sex/douching/tampons) for 3 weeks. She has a 4 week post-operative check to assess wound healing, review results and discuss further management.    Assessment & Plan:   Problem List Items Addressed This Visit   None Visit Diagnoses     Dysplasia of cervix, low grade (CIN 1)    -  Primary   Relevant Orders   LEEP with colposcopy   Surgical pathology       She will need a pap in year if ECC is negative and margins are clear. I will call her with results in about a week.   Emily Filbert, MD

## 2022-10-14 ENCOUNTER — Encounter: Payer: Self-pay | Admitting: Obstetrics & Gynecology

## 2022-10-15 LAB — SURGICAL PATHOLOGY

## 2022-10-19 ENCOUNTER — Other Ambulatory Visit: Payer: Self-pay

## 2022-10-19 ENCOUNTER — Encounter: Payer: Self-pay | Admitting: Obstetrics & Gynecology

## 2022-10-19 ENCOUNTER — Encounter: Payer: Self-pay | Admitting: *Deleted

## 2022-10-19 ENCOUNTER — Emergency Department
Admission: EM | Admit: 2022-10-19 | Discharge: 2022-10-19 | Disposition: A | Discharge status: Home / Self Care | Payer: Medicaid Other | Attending: Emergency Medicine | Admitting: Emergency Medicine

## 2022-10-19 DIAGNOSIS — E039 Hypothyroidism, unspecified: Secondary | ICD-10-CM | POA: Insufficient documentation

## 2022-10-19 DIAGNOSIS — M545 Low back pain, unspecified: Secondary | ICD-10-CM | POA: Diagnosis present

## 2022-10-19 DIAGNOSIS — M5442 Lumbago with sciatica, left side: Secondary | ICD-10-CM | POA: Insufficient documentation

## 2022-10-19 DIAGNOSIS — Z20822 Contact with and (suspected) exposure to covid-19: Secondary | ICD-10-CM | POA: Insufficient documentation

## 2022-10-19 DIAGNOSIS — M5441 Lumbago with sciatica, right side: Secondary | ICD-10-CM | POA: Insufficient documentation

## 2022-10-19 LAB — URINALYSIS, ROUTINE W REFLEX MICROSCOPIC
Bacteria, UA: NONE SEEN
Bilirubin Urine: NEGATIVE
Glucose, UA: NEGATIVE mg/dL
Ketones, ur: NEGATIVE mg/dL
Nitrite: NEGATIVE
Protein, ur: NEGATIVE mg/dL
Specific Gravity, Urine: 1.002 — ABNORMAL LOW (ref 1.005–1.030)
pH: 7 (ref 5.0–8.0)

## 2022-10-19 LAB — RESP PANEL BY RT-PCR (RSV, FLU A&B, COVID)  RVPGX2
Influenza A by PCR: NEGATIVE
Influenza B by PCR: NEGATIVE
Resp Syncytial Virus by PCR: NEGATIVE
SARS Coronavirus 2 by RT PCR: NEGATIVE

## 2022-10-19 MED ORDER — KETOROLAC TROMETHAMINE 15 MG/ML IJ SOLN
15.0000 mg | Freq: Once | INTRAMUSCULAR | Status: AC
  Administered 2022-10-19: 15 mg via INTRAMUSCULAR
  Filled 2022-10-19: qty 1

## 2022-10-19 MED ORDER — OXYCODONE HCL 5 MG PO TABS: 5.0000 mg | ORAL_TABLET | Freq: Three times a day (TID) | ORAL | 0 refills | Status: AC | PRN

## 2022-10-19 MED ORDER — OXYCODONE-ACETAMINOPHEN 5-325 MG PO TABS
1.0000 | ORAL_TABLET | Freq: Once | ORAL | Status: AC
  Administered 2022-10-19: 1 via ORAL
  Filled 2022-10-19: qty 1

## 2022-10-19 MED ORDER — ONDANSETRON 4 MG PO TBDP
4.0000 mg | ORAL_TABLET | Freq: Once | ORAL | Status: AC
  Administered 2022-10-19: 4 mg via ORAL
  Filled 2022-10-19: qty 1

## 2022-10-19 NOTE — ED Notes (Signed)
Pt resting calmly in chair; visitor remains with pt; pt relaxed and confirms spasming has eased up.

## 2022-10-19 NOTE — ED Notes (Addendum)
Pt reports that she had surgery to remove a cyst from her ovaries about 2 weeks ago; states since then has had issues with weakness, spasms that started in lower back then went into legs and now goes up through upper back into arms, odd speech pattern, pt anxious; speech is clear though in an irregular pattern almost mimicking that of when one is hyperventilating; hx anxiety, pre-diabetes, HTN; reports mild HA; reports intermittent confusion or "spacing-out"; visitor with pt. Denies any known injury at work. States has tried OTC meds and daily meds without relief of symptoms.

## 2022-10-19 NOTE — Discharge Instructions (Signed)
In addition to Tylenol and ibuprofen you can take the oxycodone as needed for pain.  Please follow-up with your primary care doctor as scheduled.  Please return to the emergency department if you develop any weakness in your legs or difficulty urinating.

## 2022-10-19 NOTE — ED Notes (Signed)
EDP KRM at bedside.

## 2022-10-19 NOTE — ED Triage Notes (Signed)
Pt to triage via wheelchair.  Pt has lower back pain.  Pt had leep procedure 1 month ago and continues to have back pain from the leep procedure.  Pt alert

## 2022-10-19 NOTE — ED Provider Notes (Signed)
[facility] Provider Note    Event Date/Time   First MD Initiated Contact with Patient 10/19/22 2138     (approximate)   History   Back Pain   HPI  Jennifer [name] is a 52 y.o. female past medical history of fibromyalgia, hypothyroidism, sarcoidosis who presents because of back pain.  Patient initially tells me that she was fine until she went to work today and around 4 PM started having shooting pain in her low back down bilateral lower extremities.  Says pain shoots from her extremities up to her head.  She also feels achy all over including bilateral shoulders and neck region.  Denies any numbness or weakness in the lower extremities bowel bladder incontinence.  Still able to ambulate.  She initially told me this was the first day of pain later says that she has been having pain since she had the procedure done for an abnormal Pap smear.  Her family member present bedside says she has had back pain but seems to be worsening.  Patient denies fevers or chills.  Denies abdominal pain.  Says she has been having some diarrhea and urinary frequency but no dysuria or urgency.    Past Medical History:  Diagnosis Date   Anxiety    Asthma    Chest pain    a. 08/2018 MV: EF 62%, no ischemia/scar.   Depression    Diastolic dysfunction    a. 09/2017 Echo: Nl LV size/fxn/wall thickness; b. 07/2020 Echo: EF 60-65%, no rwma, Gr1 DD. Nl RV size/fxn. Mild MR.   Fibromyalgia    Hyperthyroidism    Incontinence    Lymphadenopathy    Pneumonia    bilateral community acquired    PVC's (premature ventricular contractions)    a. 06/2016 24h Holter: Sinus rhythm, freq PVCs (13% burden), rare PACs; b. 03/2017 24h Holter: Predominantly sinus rhythm. Freq PVCs (6% burden); c. 08/2020 cMRI: EF 50%. Nl LV size/thickness. No LGE to suggest scar/infiltration. Nl RV size/fxn. Nl biatrial sizes. No significant valvular abnormalities.   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   Sarcoidosis 03/23/2016   Pneumonia due to Pseudomonas ([place]) 02/10/2016   Pulmonary cavitary lesion    Hilar adenopathy    Current use of steroid medication 06/17/2015   Long term current use of systemic steroids 01/03/2015   Sinus tachycardia 01/03/2015   Fungal pneumonia 11/23/2014   Cough 11/22/2014   Shortness of breath 11/22/2014   Cavitary lesion of lung 11/22/2014     Physical Exam  Triage Vital Signs: ED Triage Vitals  Enc Vitals Group     BP 10/19/22 2112 (!) 134/99     Pulse Rate 10/19/22 2112 76     Resp 10/19/22 2112 20     Temp 10/19/22 2112 97.8 F (36.6 C)     Temp Source 10/19/22 2112 Oral     SpO2 10/19/22 2112 98 %     Weight 10/19/22 2113 148 lb (67.1 kg)     Height 10/19/22 2113 4' 9"$  (1.448 m)     Head Circumference --      Peak Flow --      Pain Score 10/19/22 2113  0     Pain Loc --      Pain Edu? --      Excl. in [place]? --     Most recent vital signs: Vitals:   10/19/22 2112  BP: (!) 134/99  Pulse: 76  Resp: 20  Temp: 97.8 F (36.6 C)  SpO2: 98%     General: Awake, patient is tearful CV:  Good peripheral perfusion.  Resp:  Normal effort.  Abd:  No distention.  Abdomen is soft and nontender Neuro:             Awake, Alert, Oriented x 3  Other:  No midline C or T-spine tenderness, midline lumbar spinal tenderness, there is hyperalgesia, tenderness to palpation just the skin in that area, there is also tenderness to palpation in the bilateral paraspinal musculature in the lumbar region 5/5 strength with knee flexion, knee extension, plantarflexion dorsiflexion Sensation grossly intact in bilateral lower extremities   ED Results / Procedures / Treatments  Labs (all labs ordered  are listed, but only abnormal results are displayed) Labs Reviewed  URINALYSIS, ROUTINE W REFLEX MICROSCOPIC - Abnormal; Notable for the following components:      Result Value   Color, Urine COLORLESS (*)    APPearance CLEAR (*)    Specific Gravity, Urine 1.002 (*)    Hgb urine dipstick SMALL (*)    Leukocytes,Ua TRACE (*)    All other components within normal limits  RESP PANEL BY RT-PCR (RSV, FLU A&B, COVID)  [id number]     EKG     RADIOLOGY    PROCEDURES:  Critical Care performed: No  Procedures   MEDICATIONS ORDERED IN ED: Medications  ketorolac (TORADOL) 15 MG/ML injection 15 mg (15 mg Intramuscular Given 10/19/22 2215)  oxyCODONE-acetaminophen (PERCOCET/ROXICET) 5-325 MG per tablet 1 tablet (1 tablet Oral Given 10/19/22 2214)     IMPRESSION / MDM / Ramirez-Perez / ED COURSE  I reviewed the triage vital signs and the nursing notes.                              Patient's presentation is most consistent with acute, uncomplicated illness.  Differential diagnosis includes, but is not limited to, nonspecific low back pain, herniated disc, DJD, exam not consistent with cauda equina syndrome, low suspicion for spinal epidural abscess  Patient is a 52 year old female with fibromyalgia sarcoid who presents because of back pain.  Initially she is telling me this started today later says she has had this since she had a procedure done on her cervix last month.  Describes pain as shooting from the lumbar midline down both legs and then up to the top of her head.  She has no bowel or bladder incontinence no numbness or weakness in her extremities no trauma no fevers.  On exam patient does look uncomfortable she is tearful pain is worse with sitting up and bed.  She is tender in the lumbar midline and paraspinal musculature no thoracic or C-spine tenderness.  Does have good strength in her lower extremities and sensory exam is normal.  Clinically not concerning for spinal cord  compression or cauda equina syndrome.  He does not have any clear risk factors for spinal epidural abscess including no fever no history of immunosuppression no IV drug use.  Does have history of fibromyalgia and is having some diffuse myalgias as well today as well some diarrhea so question whether she could have underlying  viral illness that is causing exacerbation of her pain..  Do not feel that she needs urgent sectional imaging of the spine today given no concerns or compression of the cord.  Will check urinalysis COVID and flu swab and treat her pain with p.o. opiates and Toradol.  Repeat assessment after a oral Percocet and IM Toradol patient feels improved.  I was able to ambulate her and she does this without difficulty.  Is still complaining of some mild lumbar pain.  I do not feel that this is related to complication from her colposcopy she is not having any pelvic or abdominal pain abdominal region is nontender.  Given low suspicion for epidural abscess and low clinical suspicion for cauda equina syndrome with her pain being controlled and able to ambulate I think she can be safely discharged.  She has primary care follow-up tomorrow which I am reassured by.  COVID and flu test negative.  UA not consistent with infection.  Will discharge with short course of oxycodone and recommended Tylenol Motrin.       FINAL CLINICAL IMPRESSION(S) / ED DIAGNOSES   Final diagnoses:  Midline low back pain with bilateral sciatica, unspecified chronicity     Rx / DC Orders   ED Discharge Orders          Ordered    oxyCODONE (ROXICODONE) 5 MG immediate release tablet  Every 8 hours PRN        10/19/22 2324             Note:  This document was prepared using Dragon voice recognition software and may include unintentional dictation errors.   Rada Hay, MD 10/19/22 [phone]

## 2022-11-05 ENCOUNTER — Encounter: Payer: Self-pay | Admitting: Gastroenterology

## 2022-11-05 ENCOUNTER — Other Ambulatory Visit: Payer: Self-pay

## 2022-11-05 ENCOUNTER — Ambulatory Visit (INDEPENDENT_AMBULATORY_CARE_PROVIDER_SITE_OTHER): Payer: Medicaid Other | Admitting: Gastroenterology

## 2022-11-05 VITALS — BP 114/70 | HR 76 | Temp 98.7°F | Ht <= 58 in | Wt 141.8 lb

## 2022-11-05 DIAGNOSIS — R1319 Other dysphagia: Secondary | ICD-10-CM | POA: Diagnosis not present

## 2022-11-05 DIAGNOSIS — Z1211 Encounter for screening for malignant neoplasm of colon: Secondary | ICD-10-CM

## 2022-11-05 MED ORDER — OMEPRAZOLE 40 MG PO CPDR: 40.0000 mg | DELAYED_RELEASE_CAPSULE | Freq: Two times a day (BID) | ORAL | 0 refills | Status: DC

## 2022-11-05 MED ORDER — NA SULFATE-K SULFATE-MG SULF 17.5-3.13-1.6 GM/177ML PO SOLN: 354.0000 mL | Freq: Once | ORAL | 0 refills | Status: AC

## 2022-11-05 NOTE — Progress Notes (Signed)
Jonathon Bellows MD, MRCP(U.K) [address]  [place]  [place], [place] [address]  Main: [phone]  Fax: [phone]   Gastroenterology Consultation  Referring Provider:     Herminio Commons, MD Primary Care Physician:  Herminio Commons, MD Primary Gastroenterologist:  Dr. Jonathon Bellows  Reason for Consultation:   Dysphagia and colon cancer screening         HPI:   Jennifer [name] is a 52 y.o. y/o female referred for consultation & management  by Dr. Gwynneth Aliment, Howell Rucks, MD.     She says she has had occultly swallowing for about a year affecting solids and liquids.  Get stuck at the neck.  Denies any weight loss.  Denies any heartburn.  She is on Prilosec but was unsure of the dose and has been taking it after her meals.  Never had a colonoscopy no family show colon cancer or polyps no change in bowel habits only noticed blood on 1 occasion in her stool. Past Medical History:  Diagnosis Date   Anxiety    Asthma    Chest pain    a. 08/2018 MV: EF 62%, no ischemia/scar.   Depression    Diastolic dysfunction    a. 09/2017 Echo: Nl LV size/fxn/wall thickness; b. 07/2020 Echo: EF 60-65%, no rwma, Gr1 DD. Nl RV size/fxn. Mild MR.   Fibromyalgia    Hyperthyroidism    Incontinence    Lymphadenopathy    Pneumonia    bilateral community acquired    PVC's (premature ventricular contractions)    a. 06/2016 24h Holter: Sinus rhythm, freq PVCs (13% burden), rare PACs; b. 03/2017 24h Holter: Predominantly sinus rhythm. Freq PVCs (6% burden); c. 08/2020 cMRI: EF 50%. Nl LV size/thickness. No LGE to suggest scar/infiltration. Nl RV size/fxn. Nl biatrial sizes. No significant valvular abnormalities.   Sarcoidosis    Sepsis ([place])    Shortness of breath dyspnea    Tachycardia     Past Surgical History:  Procedure Laterality Date   CESAREAN SECTION     1992/1994   ENDOBRONCHIAL ULTRASOUND N/A 01/27/2016   Procedure: ENDOBRONCHIAL ULTRASOUND;  Surgeon: Vilinda Boehringer, MD;  Location:  [facility] ORS;  Service: Cardiopulmonary;  Laterality: N/A;   LYMPH NODE BIOPSY     VIDEO BRONCHOSCOPY N/A 01/27/2016   Procedure: VIDEO BRONCHOSCOPY WITH FLUORO;  Surgeon: Vilinda Boehringer, MD;  Location: [facility] ORS;  Service: Cardiopulmonary;  Laterality: N/A;    Prior to Admission medications   Medication Sig Start Date End Date Taking? Authorizing Provider  acetaminophen (TYLENOL) 500 MG tablet Take 500-1,000 mg by mouth every 6 (six) hours as needed for mild pain or fever.     [provider]  ADVAIR DISKUS 250-50 MCG/DOSE AEPB Inhale 1 puff into the lungs 2 (two) times daily. 09/08/18   Flora Lipps, MD  bisoprolol (ZEBETA) 10 MG tablet Take 2 tablets (20 mg total) by mouth daily. 08/21/21 02/17/22  End, Harrell Gave, MD  divalproex (DEPAKOTE ER) 500 MG 24 hr tablet Take 500 mg by mouth daily.     [provider]  ibuprofen (ADVIL) 200 MG tablet Take 400-600 mg by mouth every 6 (six) hours as needed for fever or mild pain.     [provider]  meloxicam (MOBIC) 7.5 MG tablet Take One tab PO BID with food 03/02/22   [provider]  methimazole (TAPAZOLE) 10 MG tablet Take 30 mg by mouth daily.  05/29/19   [provider]  omeprazole (PRILOSEC) 40 MG  capsule Take 1 capsule (40 mg total) by mouth daily. 04/06/18   Jonathon Bellows, MD  ondansetron (ZOFRAN) 4 MG tablet Take 1 tablet (4 mg total) by mouth every 6 (six) hours as needed for nausea or vomiting. 08/20/20   Carrie Mew, MD  polyethylene glycol powder (GLYCOLAX/MIRALAX) 17 GM/SCOOP powder Take 17 g by mouth as needed. 08/05/20   Darliss Cheney, MD  predniSONE (DELTASONE) 20 MG tablet     [provider]  PROAIR HFA 108 (90 Base) MCG/ACT inhaler Inhale 1 puff into the lungs every 6 (six) hours as needed. 02/26/21   Flora Lipps, MD  rosuvastatin (CRESTOR) 20 MG tablet Take 20 mg by mouth daily. 06/23/22   [provider]  SAPHRIS 5 MG SUBL 24 hr tablet Place 1 tablet (5 mg total) under the  tongue daily. 08/05/20   Darliss Cheney, MD  tiZANidine (ZANAFLEX) 2 MG tablet     [provider]  traMADol (ULTRAM) 50 MG tablet Take 1 tablet by mouth every 6 (six) hours as needed.    [provider]  Vitamin D, Cholecalciferol, 25 MCG (1000 UT) TABS Take 500 Units by mouth daily. 08/05/20   Darliss Cheney, MD    Family History  Problem Relation Age of Onset   Lung cancer Mother    Diabetes Sister      Social History   Tobacco Use   Smoking status: Never   Smokeless tobacco: Never  Vaping Use   Vaping Use: Never used  Substance Use Topics   Alcohol use: No    Alcohol/week: 0.0 standard drinks of alcohol   Drug use: No    Allergies as of 11/05/2022 - Review Complete 11/05/2022  Allergen Reaction Noted   Penicillin g Hives 11/22/2014    Review of Systems:    All systems reviewed and negative except where noted in HPI.   Physical Exam:  BP 114/70   Pulse 76   Temp 98.7 F (37.1 C) (Oral)   Ht '4\' 9"'$  (1.448 m)   Wt 141 lb 12.8 oz (64.3 kg)   LMP 09/30/2020   BMI 30.69 kg/m  Patient's last menstrual period was 09/30/2020. Psych:  Alert and cooperative. Normal mood and affect. General:   Alert,  Well-developed, well-nourished, pleasant and cooperative in NAD Head:  Normocephalic and atraumatic. Eyes:  Sclera clear, no icterus.   Conjunctiva pink. Ears:  Normal auditory acuity. Neck:  Supple; no masses or thyromegaly..    Neurologic:  Alert and oriented x3;  grossly normal neurologically. Psych:  Alert and cooperative. Normal mood and affect.  Imaging Studies: No results found.  Assessment and Plan:   Jennifer [name] is a 52 y.o. y/o female has been referred for dysphagia and colon cancer screening    Plan    EGD+colonoscopy    I have discussed alternative options, risks & benefits,  which include, but are not limited to, bleeding, infection, perforation,respiratory complication & drug reaction.  The patient agrees with this plan &  written consent will be obtained.     Follow up in 8 weeks  Dr Jonathon Bellows MD,MRCP(U.K)

## 2022-11-20 ENCOUNTER — Emergency Department: Payer: Medicaid Other

## 2022-11-20 ENCOUNTER — Inpatient Hospital Stay
Admission: EM | Admit: 2022-11-20 | Discharge: 2022-11-22 | DRG: 390 | Disposition: A | Discharge status: Home / Self Care | Payer: Medicaid Other | Attending: Internal Medicine | Admitting: Internal Medicine

## 2022-11-20 ENCOUNTER — Other Ambulatory Visit: Payer: Self-pay

## 2022-11-20 DIAGNOSIS — Z7951 Long term (current) use of inhaled steroids: Secondary | ICD-10-CM

## 2022-11-20 DIAGNOSIS — R1314 Dysphagia, pharyngoesophageal phase: Secondary | ICD-10-CM | POA: Diagnosis present

## 2022-11-20 DIAGNOSIS — Z791 Long term (current) use of non-steroidal anti-inflammatories (NSAID): Secondary | ICD-10-CM

## 2022-11-20 DIAGNOSIS — D869 Sarcoidosis, unspecified: Secondary | ICD-10-CM | POA: Diagnosis present

## 2022-11-20 DIAGNOSIS — R109 Unspecified abdominal pain: Secondary | ICD-10-CM | POA: Diagnosis present

## 2022-11-20 DIAGNOSIS — R000 Tachycardia, unspecified: Secondary | ICD-10-CM | POA: Diagnosis present

## 2022-11-20 DIAGNOSIS — K567 Ileus, unspecified: Principal | ICD-10-CM | POA: Diagnosis present

## 2022-11-20 DIAGNOSIS — J45909 Unspecified asthma, uncomplicated: Secondary | ICD-10-CM | POA: Diagnosis present

## 2022-11-20 DIAGNOSIS — D860 Sarcoidosis of lung: Secondary | ICD-10-CM | POA: Diagnosis present

## 2022-11-20 DIAGNOSIS — Z880 Allergy status to penicillin: Secondary | ICD-10-CM

## 2022-11-20 DIAGNOSIS — J189 Pneumonia, unspecified organism: Secondary | ICD-10-CM

## 2022-11-20 DIAGNOSIS — T50905A Adverse effect of unspecified drugs, medicaments and biological substances, initial encounter: Secondary | ICD-10-CM

## 2022-11-20 DIAGNOSIS — M797 Fibromyalgia: Secondary | ICD-10-CM | POA: Diagnosis present

## 2022-11-20 DIAGNOSIS — R197 Diarrhea, unspecified: Secondary | ICD-10-CM

## 2022-11-20 DIAGNOSIS — R112 Nausea with vomiting, unspecified: Secondary | ICD-10-CM

## 2022-11-20 DIAGNOSIS — K529 Noninfective gastroenteritis and colitis, unspecified: Secondary | ICD-10-CM | POA: Diagnosis present

## 2022-11-20 DIAGNOSIS — Z1211 Encounter for screening for malignant neoplasm of colon: Secondary | ICD-10-CM

## 2022-11-20 DIAGNOSIS — J988 Other specified respiratory disorders: Secondary | ICD-10-CM

## 2022-11-20 DIAGNOSIS — R9431 Abnormal electrocardiogram [ECG] [EKG]: Secondary | ICD-10-CM | POA: Diagnosis present

## 2022-11-20 DIAGNOSIS — J069 Acute upper respiratory infection, unspecified: Secondary | ICD-10-CM | POA: Diagnosis present

## 2022-11-20 DIAGNOSIS — Z881 Allergy status to other antibiotic agents status: Secondary | ICD-10-CM

## 2022-11-20 DIAGNOSIS — E0500 Thyrotoxicosis with diffuse goiter without thyrotoxic crisis or storm: Secondary | ICD-10-CM | POA: Diagnosis present

## 2022-11-20 DIAGNOSIS — Z833 Family history of diabetes mellitus: Secondary | ICD-10-CM

## 2022-11-20 DIAGNOSIS — F32A Depression, unspecified: Secondary | ICD-10-CM | POA: Diagnosis present

## 2022-11-20 DIAGNOSIS — Z79899 Other long term (current) drug therapy: Secondary | ICD-10-CM

## 2022-11-20 DIAGNOSIS — Z1152 Encounter for screening for COVID-19: Secondary | ICD-10-CM

## 2022-11-20 DIAGNOSIS — Z91040 Latex allergy status: Secondary | ICD-10-CM

## 2022-11-20 DIAGNOSIS — Z801 Family history of malignant neoplasm of trachea, bronchus and lung: Secondary | ICD-10-CM

## 2022-11-20 DIAGNOSIS — K449 Diaphragmatic hernia without obstruction or gangrene: Secondary | ICD-10-CM | POA: Diagnosis present

## 2022-11-20 LAB — URINALYSIS, ROUTINE W REFLEX MICROSCOPIC
Bilirubin Urine: NEGATIVE
Glucose, UA: NEGATIVE mg/dL
Hgb urine dipstick: NEGATIVE
Ketones, ur: 5 mg/dL — AB
Leukocytes,Ua: NEGATIVE
Nitrite: NEGATIVE
Protein, ur: NEGATIVE mg/dL
Specific Gravity, Urine: 1.017 (ref 1.005–1.030)
pH: 5 (ref 5.0–8.0)

## 2022-11-20 LAB — RESP PANEL BY RT-PCR (RSV, FLU A&B, COVID)  RVPGX2
Influenza A by PCR: NEGATIVE
Influenza B by PCR: NEGATIVE
Resp Syncytial Virus by PCR: NEGATIVE
SARS Coronavirus 2 by RT PCR: NEGATIVE

## 2022-11-20 LAB — COMPREHENSIVE METABOLIC PANEL
ALT: 15 U/L (ref 0–44)
AST: 19 U/L (ref 15–41)
Albumin: 4.3 g/dL (ref 3.5–5.0)
Alkaline Phosphatase: 88 U/L (ref 38–126)
Anion gap: 9 (ref 5–15)
BUN: 18 mg/dL (ref 6–20)
CO2: 25 mmol/L (ref 22–32)
Calcium: 8.9 mg/dL (ref 8.9–10.3)
Chloride: 102 mmol/L (ref 98–111)
Creatinine, Ser: 0.8 mg/dL (ref 0.44–1.00)
GFR, Estimated: >60 mL/min (ref >60)
Glucose, Bld: 142 mg/dL — ABNORMAL HIGH (ref 70–99)
Potassium: 3.8 mmol/L (ref 3.5–5.1)
Sodium: 136 mmol/L (ref 135–145)
Total Bilirubin: 0.7 mg/dL (ref 0.3–1.2)
Total Protein: 8.5 g/dL — ABNORMAL HIGH (ref 6.5–8.1)

## 2022-11-20 LAB — URINE DRUG SCREEN, QUALITATIVE (ARMC ONLY)
Amphetamines, Ur Screen: NOT DETECTED
Barbiturates, Ur Screen: NOT DETECTED
Benzodiazepine, Ur Scrn: NOT DETECTED
Cannabinoid 50 Ng, Ur ~~LOC~~: NOT DETECTED
Cocaine Metabolite,Ur ~~LOC~~: NOT DETECTED
MDMA (Ecstasy)Ur Screen: NOT DETECTED
Methadone Scn, Ur: NOT DETECTED
Opiate, Ur Screen: NOT DETECTED
Phencyclidine (PCP) Ur S: NOT DETECTED
Tricyclic, Ur Screen: NOT DETECTED

## 2022-11-20 LAB — CBC
HCT: 45.9 % (ref 36.0–46.0)
Hemoglobin: 14.5 g/dL (ref 12.0–15.0)
MCH: 25.8 pg — ABNORMAL LOW (ref 26.0–34.0)
MCHC: 31.6 g/dL (ref 30.0–36.0)
MCV: 81.7 fL (ref 80.0–100.0)
Platelets: 243 10*3/uL (ref 150–400)
RBC: 5.62 MIL/uL — ABNORMAL HIGH (ref 3.87–5.11)
RDW: 13.8 % (ref 11.5–15.5)
WBC: 6 10*3/uL (ref 4.0–10.5)
nRBC: 0 % (ref 0.0–0.2)

## 2022-11-20 LAB — TSH: TSH: 1.152 u[IU]/mL (ref 0.350–4.500)

## 2022-11-20 LAB — LIPASE, BLOOD: Lipase: 32 U/L (ref 11–51)

## 2022-11-20 LAB — MAGNESIUM: Magnesium: 1.9 mg/dL (ref 1.7–2.4)

## 2022-11-20 LAB — PROCALCITONIN: Procalcitonin: <0.1 ng/mL

## 2022-11-20 LAB — TROPONIN I (HIGH SENSITIVITY): Troponin I (High Sensitivity): <2 ng/L (ref <18)

## 2022-11-20 LAB — T4, FREE: Free T4: 0.88 ng/dL (ref 0.61–1.12)

## 2022-11-20 MED ORDER — KETOROLAC TROMETHAMINE 15 MG/ML IJ SOLN
15.0000 mg | Freq: Once | INTRAMUSCULAR | Status: AC
  Administered 2022-11-20: 15 mg via INTRAVENOUS
  Filled 2022-11-20: qty 1

## 2022-11-20 MED ORDER — LORAZEPAM 2 MG/ML IJ SOLN
0.5000 mg | Freq: Once | INTRAMUSCULAR | Status: DC
  Filled 2022-11-20: qty 1

## 2022-11-20 MED ORDER — SODIUM CHLORIDE 0.9 % IV SOLN
1.0000 g | Freq: Once | INTRAVENOUS | Status: AC
  Administered 2022-11-20: 1 g via INTRAVENOUS
  Filled 2022-11-20: qty 10

## 2022-11-20 MED ORDER — DIVALPROEX SODIUM ER 250 MG PO TB24: 500.0000 mg | ORAL_TABLET | Freq: Every day | ORAL | Status: DC

## 2022-11-20 MED ORDER — SODIUM CHLORIDE 0.9 % IV SOLN
100.0000 mg | Freq: Once | INTRAVENOUS | Status: AC
  Administered 2022-11-20: 100 mg via INTRAVENOUS
  Filled 2022-11-20: qty 100

## 2022-11-20 MED ORDER — METHOCARBAMOL 500 MG PO TABS: 500.0000 mg | ORAL_TABLET | Freq: Three times a day (TID) | ORAL | Status: DC | PRN

## 2022-11-20 MED ORDER — PANTOPRAZOLE SODIUM 40 MG IV SOLR
40.0000 mg | Freq: Once | INTRAVENOUS | Status: AC
  Administered 2022-11-20: 40 mg via INTRAVENOUS
  Filled 2022-11-20: qty 10

## 2022-11-20 MED ORDER — METOPROLOL TARTRATE 5 MG/5ML IV SOLN
2.5000 mg | Freq: Once | INTRAVENOUS | Status: AC
  Administered 2022-11-20: 2.5 mg via INTRAVENOUS
  Filled 2022-11-20: qty 5

## 2022-11-20 MED ORDER — ACETAMINOPHEN 325 MG PO TABS: 650.0000 mg | ORAL_TABLET | Freq: Four times a day (QID) | ORAL | Status: DC | PRN

## 2022-11-20 MED ORDER — SODIUM CHLORIDE 0.9 % IV BOLUS
1000.0000 mL | Freq: Once | INTRAVENOUS | Status: AC
  Administered 2022-11-20: 1000 mL via INTRAVENOUS

## 2022-11-20 MED ORDER — ACETAMINOPHEN 650 MG RE SUPP: 650.0000 mg | Freq: Four times a day (QID) | RECTAL | Status: DC | PRN

## 2022-11-20 MED ORDER — ACETAMINOPHEN 500 MG PO TABS
500.0000 mg | ORAL_TABLET | Freq: Once | ORAL | Status: AC
  Administered 2022-11-20: 500 mg via ORAL
  Filled 2022-11-20: qty 1

## 2022-11-20 MED ORDER — MAGNESIUM SULFATE 2 GM/50ML IV SOLN
2.0000 g | Freq: Once | INTRAVENOUS | Status: AC
  Administered 2022-11-20: 2 g via INTRAVENOUS
  Filled 2022-11-20: qty 50

## 2022-11-20 MED ORDER — LACTATED RINGERS IV SOLN: INTRAVENOUS | Status: DC

## 2022-11-20 MED ORDER — IOHEXOL 350 MG/ML SOLN
100.0000 mL | Freq: Once | INTRAVENOUS | Status: AC | PRN
  Administered 2022-11-20: 100 mL via INTRAVENOUS

## 2022-11-20 MED ORDER — MORPHINE SULFATE (PF) 2 MG/ML IV SOLN: 2.0000 mg | INTRAVENOUS | Status: DC | PRN

## 2022-11-20 MED ORDER — ONDANSETRON HCL 4 MG/2ML IJ SOLN
4.0000 mg | Freq: Once | INTRAMUSCULAR | Status: AC
  Administered 2022-11-20: 4 mg via INTRAVENOUS
  Filled 2022-11-20: qty 2

## 2022-11-20 MED ORDER — PANTOPRAZOLE SODIUM 40 MG IV SOLR
40.0000 mg | INTRAVENOUS | Status: DC
  Administered 2022-11-20 – 2022-11-21 (×2): 40 mg via INTRAVENOUS
  Filled 2022-11-20 (×2): qty 10

## 2022-11-20 MED ORDER — PROCHLORPERAZINE EDISYLATE 10 MG/2ML IJ SOLN: 10.0000 mg | INTRAMUSCULAR | Status: DC | PRN

## 2022-11-20 MED ORDER — LACTATED RINGERS IV BOLUS
1000.0000 mL | Freq: Once | INTRAVENOUS | Status: AC
  Administered 2022-11-20: 1000 mL via INTRAVENOUS

## 2022-11-20 NOTE — Assessment & Plan Note (Addendum)
History of sarcoidosis CT chest shows :Bilateral bronchial wall thickening and scattered ground-glass airspace disease, right greater than left, consistent with inflammatory or infectious etiology Patient was started on Rocephin and doxycycline in the ED Respiratory viral panel negative Sarcoidosis flare not suspected at this time.  Patient not currently on sarcoidosis meds. -Follow procalcitonin -Will continue antibiotics if procalcitonin positive -Antitussives, incentive spirometry, albuterol as needed

## 2022-11-20 NOTE — Assessment & Plan Note (Addendum)
Continue Zanaflex.  Avoid NSAIDs

## 2022-11-20 NOTE — ED Provider Notes (Signed)
[facility] Provider Note    Event Date/Time   First MD Initiated Contact with Patient 11/20/22 1531     (approximate)   History   Emesis   HPI  Jennifer [name] is a 52 y.o. female with history of depression, fibromyalgia, hyperthyroidism who comes in with concerns for vomiting.  Patient reports that she was seen yesterday by her primary care doctor.  She reported having some sinus aches for the past 4 days and thought it was her allergies but she was prescribed azithromycin.  She reports taking the medication yesterday 2 tablets and then developing significant amount of vomiting and diarrhea last night.  She reports the diarrhea has gotten better but the vomiting seems to have continued.  She states that because of this has not been able to take her normal Tyleno (which she typically takes no more then 2g in a day)  and so she does have aches all over and not feeling well.  She denies any falls, and her head.  She does report little bit of shortness of breath associated with more of the congestion and the coughing but more when she is coughing than at rest.  Denies really anything at this time.  She denies any swelling in her legs.  Calf tenderness.    Physical Exam   Triage Vital Signs: ED Triage Vitals  Enc Vitals Group     BP 11/20/22 1527 127/82     Pulse Rate 11/20/22 1527 (!) 113     Resp 11/20/22 1527 16     Temp 11/20/22 1527 98.6 F (37 C)     Temp src --      SpO2 11/20/22 1527 96 %     Weight --      Height --      Head Circumference --      Peak Flow --      Pain Score 11/20/22 1524 10     Pain Loc --      Pain Edu? --      Excl. in [place]? --     Most recent vital signs: Vitals:   11/20/22 1527  BP: 127/82  Pulse: (!) 113  Resp: 16  Temp: 98.6 F (37 C)  SpO2: 96%     General: Awake, no distress.  + nasal congestion- frequently sniffing nose. CV:  Good peripheral perfusion.  Tachycardia Resp:  Normal effort.  Abd:  No  distention.  Soft nontender Other:  No swelling in legs.  No calf tenderness   ED Results / Procedures / Treatments   Labs (all labs ordered are listed, but only abnormal results are displayed) Labs Reviewed  COMPREHENSIVE METABOLIC PANEL - Abnormal; Notable for the following components:      Result Value   Glucose, Bld 142 (*)    Total Protein 8.5 (*)    All other components within normal limits  CBC - Abnormal; Notable for the following components:   RBC 5.62 (*)    MCH 25.8 (*)    All other components within normal limits  RESP PANEL BY RT-PCR (RSV, FLU A&B, COVID)  RVPGX2  LIPASE, BLOOD  URINALYSIS, ROUTINE W REFLEX MICROSCOPIC  MAGNESIUM  TROPONIN I (HIGH SENSITIVITY)     EKG  My interpretation of EKG:  Sinus tachycardia rate of 110 without any ST elevation or T wave inversions, QTc 554  Repeat EKG remains tachycardic with a rate of 108 without any ST elevation or T wave inversions with QTc  of [place] I have reviewed the xray personally and interpretted and no PNA   PROCEDURES:  Critical Care performed: No  .1-3 Lead EKG Interpretation  Performed by: Vanessa [place], MD Authorized by: Vanessa [place], MD     Interpretation: abnormal     ECG rate:  113   ECG rate assessment: tachycardic     Rhythm: sinus tachycardia     Ectopy: none     Conduction: normal      MEDICATIONS ORDERED IN ED: Medications  sodium chloride 0.9 % bolus 1,000 mL (1,000 mLs Intravenous New Bag/Given 11/20/22 1551)  ondansetron (ZOFRAN) injection 4 mg (4 mg Intravenous Given 11/20/22 1546)     IMPRESSION / MDM / ASSESSMENT AND PLAN / ED COURSE  I reviewed the triage vital signs and the nursing notes.   Patient's presentation is most consistent with acute presentation with potential threat to life or bodily function.   Patient comes in with concerns for nausea vomiting diarrhea after starting azithromycin suspect drug reaction.  Sounds more likely a viral illness anyways  but I will get a chest x-ray just to ensure no pneumonia given SOB with Cough. Denies h/o PE and no evidence of DVT on exam and seems more likely related to allergies/viral illness.  Labs ordered evaluate for any Electra abnormalities, AKI.  Currently her abdomen is soft nontender low suspicion for acute abdominal process. Discussed CT imaging and pt okay with holding off now. Will test for COVID, flu.  Will give patient symptomatic treatment. Fluids, zofran, toradol, tylenol.   Her EKG does show prolonged QTc.  Will hold off on further QTc prolonging medications and add on a magnesium placed on the cardiac monitor.  The azithromycin could be contributing to this as well so we will have her discontinue this. Will place as allergy  CBC is reassuring.  Lipase normal CMP normal  6:36 PM COVID patient she seems very confused about her medical history.  She is having some tenderness in her lower abdomen remains tachycardic to 115 even with IV fluids.  I will attempt 2.5 of IV metoprolol.  Given the confusion I will add on CT imaging to rule out any other cause for her symptoms.  I have discussed further with patient and she is now admitting that she has not been taking her thyroid medicines maybe since December.  We called over to her pharmacy and her bisoprolol was February 20, 2022 so suspect that this could all be related to noncompliance of her medications.  7:05 PM Thyroid test is normal  8:09 PM Patient remains tachycardic 110s to 115.  CT imaging with possible pyloric inflammation.  Patient already given PPI possible mild ileus.  She does have a lobulated uterus.  Discussed this with patient that she will need an outpatient ultrasound she expressed understanding.  CT imaging was concerning for possible inflammatory versus infectious etiology.  Given all of her symptoms we will treat for pneumonia with ceftriaxone, Doxy.  Given the prolonged QTc it is difficult to send patient out with her inability to  tolerate p.o. will discuss with hospital team for admission   The patient is on the cardiac monitor to evaluate for evidence of arrhythmia and/or significant heart rate changes.      FINAL CLINICAL IMPRESSION(S) / ED DIAGNOSES   Final diagnoses:  QT prolongation  Nausea vomiting and diarrhea  Adverse effect of drug, initial encounter  Pneumonia due to infectious organism, unspecified laterality, unspecified part of  lung  Ileus ([place])     Rx / DC Orders   ED Discharge Orders     None        Note:  This document was prepared using Dragon voice recognition software and may include unintentional dictation errors.   Vanessa [place], MD 11/20/22 2010

## 2022-11-20 NOTE — Assessment & Plan Note (Signed)
Acute flare not suspected.  Not currently on related medications

## 2022-11-20 NOTE — ED Notes (Signed)
Called lab to addon procal.

## 2022-11-20 NOTE — Assessment & Plan Note (Signed)
Avoid QT prolonging drugs  

## 2022-11-20 NOTE — ED Notes (Signed)
Dr. Duncan at bedside 

## 2022-11-20 NOTE — ED Triage Notes (Signed)
Pt comes with c/o vomiting that started this am. Pt states belly pain. Pt states is is in knots all over to right side.

## 2022-11-20 NOTE — ED Notes (Signed)
Per inpatient RN, okay to bring patient to the floor.

## 2022-11-20 NOTE — Assessment & Plan Note (Addendum)
TSH and free T4 WNL Followed by endocrinology, last seen 05/2022 when she was  weaned-10mg >5mg >2.5mg   Patient self discontinued methimazole

## 2022-11-20 NOTE — H&P (Signed)
History and Physical    Patient: Jennifer [name] DOB: [birth year] DOA: 11/20/2022 DOS: the patient was seen and examined on 11/20/2022 PCP: Herminio Commons, MD  Patient coming from: Home  Chief Complaint:  Chief Complaint  Patient presents with   Emesis    HPI: Jennifer [name] is a 52 y.o. female with medical history significant for fibromyalgia, pulmonary sarcoidosis, hyperthyroidism on methimazole, depression who presents to the ED with a 1 day history of vomiting and crampy abdominal pain.  She also did follow-up diarrhea several hours after the vomiting started.  Prior to the onset of the symptoms patient had respiratory symptoms with a productive cough and nasal congestion and was started on azithromycin.  She said the vomiting started after taking the azithromycin.  She has had no chest pain, fever or chills and denies lower extremity pain or swelling.  Chart review reveals that patient was evaluated by gastroenterologist, Dr. Vicente Males on 11/05/2022 with a complaint of dysphagia and has an upcoming colonoscopy and upper endoscopy later this month and 3/20. ED course and data review:Persistently elevated heart rate in the 1 teens to 120s with otherwise normal vitals.  Normal WBC and negative respiratory viral panel.  CBC and CMP otherwise unremarkable.  Urinalysis significant for 5 ketones otherwise unremarkable.  TSH and free T4 WNL.  EKG, personally viewed and interpreted showing sinus tachycardia at 110 with nonspecific ST-T wave changes and prolonged QT interval. Imaging: Patient had extensive imaging including CTA chest and CT abdomen and pelvis with contrast that was significant for airspace disease, inflammation versus inflammatory as well as ileus and pyloric thickening (among other findings) as outlined below : CT abd and pelvis IMPRESSION: 1. No CT evidence for acute intra-abdominal or pelvic abnormality. Fluid-filled nondilated mid to distal small bowel without  convincing evidence for transition point/obstruction, query mild ileus. 2. Slightly thickened pyloric region which could be due to prominent contraction versus inflammation 3. Lobulated uterus with multiple masses presumably fibroids. Heterogenous lower uterine segment masses also probably due to fibroids but suggest confirmation with nonemergent pelvic ultrasound  CTA chest IMPRESSION: 1. No evidence of pulmonary embolus. 2. Bilateral bronchial wall thickening and scattered ground-glass airspace disease, right greater than left, consistent with inflammatory or infectious etiology. 3. Chronic bronchiectasis, with stable left upper lobe mycetoma. 4. Small hiatal hernia.  Patient was treated with IV fluid boluses, antiemetics and ceftriaxone and doxycycline.  Hospitalization requested due to limited outpatient options for antiemetics given QT prolongation as well as due to concern for persistent sinus tachycardia in spite of fluid boluses.  Review of Systems: As mentioned in the history of present illness. All other systems reviewed and are negative.  Past Medical History:  Diagnosis Date   Anxiety    Asthma    Chest pain    a. 08/2018 MV: EF 62%, no ischemia/scar.   Depression    Diastolic dysfunction    a. 09/2017 Echo: Nl LV size/fxn/wall thickness; b. 07/2020 Echo: EF 60-65%, no rwma, Gr1 DD. Nl RV size/fxn. Mild MR.   Fibromyalgia    Hyperthyroidism    Incontinence    Lymphadenopathy    Pneumonia    bilateral community acquired    PVC's (premature ventricular contractions)    a. 06/2016 24h Holter: Sinus rhythm, freq PVCs (13% burden), rare PACs; b. 03/2017 24h Holter: Predominantly sinus rhythm. Freq PVCs (6% burden); c. 08/2020 cMRI: EF 50%. Nl LV size/thickness. No LGE to suggest scar/infiltration. Nl RV size/fxn. Nl biatrial sizes. No significant  valvular abnormalities.   Sarcoidosis    Sepsis ([place])    Shortness of breath dyspnea    Tachycardia    Past Surgical History:   Procedure Laterality Date   CESAREAN SECTION     1992/1994   ENDOBRONCHIAL ULTRASOUND N/A 01/27/2016   Procedure: ENDOBRONCHIAL ULTRASOUND;  Surgeon: Vilinda Boehringer, MD;  Location: [facility] ORS;  Service: Cardiopulmonary;  Laterality: N/A;   LYMPH NODE BIOPSY     VIDEO BRONCHOSCOPY N/A 01/27/2016   Procedure: VIDEO BRONCHOSCOPY WITH FLUORO;  Surgeon: Vilinda Boehringer, MD;  Location: [facility] ORS;  Service: Cardiopulmonary;  Laterality: N/A;   Social History:  reports that she has never smoked. She has never used smokeless tobacco. She reports that she does not drink alcohol and does not use drugs.  Allergies  Allergen Reactions   Azithromycin Nausea And Vomiting    Also noted to have prolong qtc on EKG after    Latex Itching   Penicillin G Hives    Family History  Problem Relation Age of Onset   Lung cancer Mother    Diabetes Sister     Prior to Admission medications   Medication Sig Start Date End Date Taking? Authorizing Provider  acetaminophen (TYLENOL) 500 MG tablet Take 500-1,000 mg by mouth every 6 (six) hours as needed for mild pain or fever.    Yes [provider]  ADVAIR DISKUS 250-50 MCG/DOSE AEPB Inhale 1 puff into the lungs 2 (two) times daily. 09/08/18  Yes Kasa, Maretta Bees, MD  azithromycin (ZITHROMAX) 250 MG tablet Take 250 mg by mouth daily. 11/19/22  Yes [provider]  divalproex (DEPAKOTE ER) 500 MG 24 hr tablet Take 500 mg by mouth daily.    Yes [provider]  ibuprofen (ADVIL) 200 MG tablet Take 400-600 mg by mouth every 6 (six) hours as needed for fever or mild pain.    Yes [provider]  meloxicam (MOBIC) 7.5 MG tablet Take One tab PO BID with food 03/02/22  Yes [provider]  methimazole (TAPAZOLE) 10 MG tablet Take 10 mg by mouth daily. 05/29/19  Yes [provider]  methocarbamol (ROBAXIN) 500 MG tablet Take 500-1,000 mg by mouth 3 (three) times daily. Two to three times daily 10/20/22  Yes [provider]   naproxen (NAPROSYN) 500 MG tablet Take 500 mg by mouth daily. 11/19/22  Yes [provider]  omeprazole (PRILOSEC) 40 MG capsule Take 1 capsule (40 mg total) by mouth in the morning and at bedtime. 11/05/22 02/03/23 Yes Jonathon Bellows, MD  ondansetron (ZOFRAN) 4 MG tablet Take 1 tablet (4 mg total) by mouth every 6 (six) hours as needed for nausea or vomiting. 08/20/20  Yes Carrie Mew, MD  predniSONE (DELTASONE) 20 MG tablet    Yes [provider]  PROAIR HFA 108 (90 Base) MCG/ACT inhaler Inhale 1 puff into the lungs every 6 (six) hours as needed. 02/26/21  Yes Flora Lipps, MD  rosuvastatin (CRESTOR) 20 MG tablet Take 20 mg by mouth daily. 06/23/22  Yes [provider]  tiZANidine (ZANAFLEX) 2 MG tablet    Yes [provider]  traMADol (ULTRAM) 50 MG tablet Take 1 tablet by mouth every 6 (six) hours as needed.   Yes [provider]  bisoprolol (ZEBETA) 10 MG tablet Take 2 tablets (20 mg total) by mouth daily. 08/21/21 02/17/22  End, Harrell Gave, MD  polyethylene glycol powder (GLYCOLAX/MIRALAX) 17 GM/SCOOP powder Take 17 g by mouth as needed. 08/05/20   Darliss Cheney, MD  SAPHRIS  5 MG SUBL 24 hr tablet Place 1 tablet (5 mg total) under the tongue daily. Patient not taking: Reported on 11/20/2022 08/05/20   Darliss Cheney, MD  Vitamin D, Cholecalciferol, 25 MCG (1000 UT) TABS Take 500 Units by mouth daily. Patient not taking: Reported on 11/20/2022 08/05/20   Darliss Cheney, MD    Physical Exam: Vitals:   11/20/22 1527  BP: 127/82  Pulse: (!) 113  Resp: 16  Temp: 98.6 F (37 C)  SpO2: 96%   Physical Exam Vitals and nursing note reviewed.  Constitutional:      General: She is not in acute distress.    Appearance: She is ill-appearing.  HENT:     Head: Normocephalic and atraumatic.     Mouth/Throat:     Mouth: Mucous membranes are dry.  Cardiovascular:     Rate and Rhythm: Regular rhythm. Tachycardia present.     Heart sounds: Normal heart  sounds.  Pulmonary:     Effort: Pulmonary effort is normal.     Breath sounds: Normal breath sounds.  Abdominal:     Palpations: Abdomen is soft.     Tenderness: There is generalized abdominal tenderness.  Neurological:     Mental Status: Mental status is at baseline.     Labs on Admission: I have personally reviewed following labs and imaging studies  CBC: Recent Labs  Lab 11/20/22 1535  WBC 6.0  HGB 14.5  HCT 45.9  MCV 81.7  PLT 0000000   Basic Metabolic Panel: Recent Labs  Lab 11/20/22 1535 11/20/22 1620  NA 136  --   K 3.8  --   CL 102  --   CO2 25  --   GLUCOSE 142*  --   BUN 18  --   CREATININE 0.80  --   CALCIUM 8.9  --   MG  --  1.9   GFR: CrCl cannot be calculated (Unknown ideal weight.). Liver Function Tests: Recent Labs  Lab 11/20/22 1535  AST 19  ALT 15  ALKPHOS 88  BILITOT 0.7  PROT 8.5*  ALBUMIN 4.3   Recent Labs  Lab 11/20/22 1535  LIPASE 32   No results for input(s): "AMMONIA" in the last 168 hours. Coagulation Profile: No results for input(s): "INR", "PROTIME" in the last 168 hours. Cardiac Enzymes: No results for input(s): "CKTOTAL", "CKMB", "CKMBINDEX", "TROPONINI" in the last 168 hours. BNP (last 3 results) No results for input(s): "PROBNP" in the last 8760 hours. HbA1C: No results for input(s): "HGBA1C" in the last 72 hours. CBG: No results for input(s): "GLUCAP" in the last 168 hours. Lipid Profile: No results for input(s): "CHOL", "HDL", "LDLCALC", "TRIG", "CHOLHDL", "LDLDIRECT" in the last 72 hours. Thyroid Function Tests: Recent Labs    11/20/22 1824  TSH 1.152  FREET4 0.88   Anemia Panel: No results for input(s): "VITAMINB12", "FOLATE", "FERRITIN", "TIBC", "IRON", "RETICCTPCT" in the last 72 hours. Urine analysis:    Component Value Date/Time   COLORURINE YELLOW (A) 11/20/2022 1535   APPEARANCEUR CLEAR (A) 11/20/2022 1535   APPEARANCEUR CLEAR 11/28/2014 0338   LABSPEC 1.017 11/20/2022 1535   LABSPEC 1.005  11/28/2014 0338   PHURINE 5.0 11/20/2022 1535   GLUCOSEU NEGATIVE 11/20/2022 1535   GLUCOSEU NEGATIVE 11/28/2014 0338   HGBUR NEGATIVE 11/20/2022 1535   BILIRUBINUR NEGATIVE 11/20/2022 1535   BILIRUBINUR NEGATIVE 11/28/2014 0338   KETONESUR 5 (A) 11/20/2022 1535   PROTEINUR NEGATIVE 11/20/2022 1535   NITRITE NEGATIVE 11/20/2022 1535   LEUKOCYTESUR NEGATIVE 11/20/2022 1535  LEUKOCYTESUR NEGATIVE 11/28/2014 [id number]    Radiological Exams on Admission: CT Angio Chest PE W and/or Wo Contrast  Result Date: 11/20/2022 CLINICAL DATA:  Vomiting, abdominal pain, cough EXAM: CT ANGIOGRAPHY CHEST WITH CONTRAST TECHNIQUE: Multidetector CT imaging of the chest was performed using the standard protocol during bolus administration of intravenous contrast. Multiplanar CT image reconstructions and MIPs were obtained to evaluate the vascular anatomy. RADIATION DOSE REDUCTION: This exam was performed according to the departmental dose-optimization program which includes automated exposure control, adjustment of the mA and/or kV according to patient size and/or use of iterative reconstruction technique. CONTRAST:  14mL OMNIPAQUE IOHEXOL 350 MG/ML SOLN COMPARISON:  08/03/2020, 11/20/2022 FINDINGS: Cardiovascular: This is a technically adequate evaluation of the pulmonary vasculature. No filling defects or pulmonary emboli. The heart is unremarkable without pericardial effusion. No evidence of thoracic aortic aneurysm or dissection. Bovine configuration of the aortic arch. Mediastinum/Nodes: No enlarged mediastinal, hilar, or axillary lymph nodes. Thyroid gland, trachea, and esophagus demonstrate no significant findings. Small hiatal hernia. Lungs/Pleura: Stable areas of bronchiectasis, most pronounced within the upper lobes. Continued left upper lobe cavity with internal soft tissue density, consistent with mycetoma. No change since prior exam. There are scattered ground-glass opacities bilaterally, right greater than  left, with associated bronchial wall thickening. No effusion or pneumothorax. Central airways are patent. Upper Abdomen: No acute abnormality. Musculoskeletal: No acute or destructive bony lesions. Reconstructed images demonstrate no additional findings. Review of the MIP images confirms the above findings. IMPRESSION: 1. No evidence of pulmonary embolus. 2. Bilateral bronchial wall thickening and scattered ground-glass airspace disease, right greater than left, consistent with inflammatory or infectious etiology. 3. Chronic bronchiectasis, with stable left upper lobe mycetoma. 4. Small hiatal hernia. Electronically Signed   By: Randa Ngo M.D.   On: 11/20/2022 19:56   CT HEAD WO CONTRAST (5MM)  Result Date: 11/20/2022 CLINICAL DATA:  Vomiting EXAM: CT HEAD WITHOUT CONTRAST TECHNIQUE: Contiguous axial images were obtained from the base of the skull through the vertex without intravenous contrast. RADIATION DOSE REDUCTION: This exam was performed according to the departmental dose-optimization program which includes automated exposure control, adjustment of the mA and/or kV according to patient size and/or use of iterative reconstruction technique. COMPARISON:  CT 04/18/2022 FINDINGS: Brain: No evidence of acute infarction, hemorrhage, hydrocephalus, extra-axial collection or mass lesion/mass effect. Vascular: No hyperdense vessel or unexpected calcification. Skull: Normal. Negative for fracture or focal lesion. Small burr holes at the cranial vertex. Sinuses/Orbits: Mucosal thickening and postsurgical changes in the sinuses Other: None IMPRESSION: Negative non contrasted CT appearance of the brain. Electronically Signed   By: Donavan Foil M.D.   On: 11/20/2022 19:21   CT ABDOMEN PELVIS W CONTRAST  Result Date: 11/20/2022 CLINICAL DATA:  Abdomen pain EXAM: CT ABDOMEN AND PELVIS WITH CONTRAST TECHNIQUE: Multidetector CT imaging of the abdomen and pelvis was performed using the standard protocol following  bolus administration of intravenous contrast. RADIATION DOSE REDUCTION: This exam was performed according to the departmental dose-optimization program which includes automated exposure control, adjustment of the mA and/or kV according to patient size and/or use of iterative reconstruction technique. CONTRAST:  121mL OMNIPAQUE IOHEXOL 350 MG/ML SOLN COMPARISON:  PET CT 11/09/2014 FINDINGS: Lower chest: See separately dictated chest CT for lung findings. Small hiatal hernia Hepatobiliary: No focal liver abnormality is seen. No gallstones, gallbladder wall thickening, or biliary dilatation. Pancreas: Unremarkable. No pancreatic ductal dilatation or surrounding inflammatory changes. Spleen: Normal in size without focal abnormality. Adrenals/Urinary Tract: Adrenal glands are unremarkable. Kidneys  are normal, without renal calculi, focal lesion, or hydronephrosis. Bladder is unremarkable. Stomach/Bowel: The stomach is nonenlarged. Slight thickening in the region of pylorus. Fluid-filled nondilated mid to distal small bowel. No bowel wall thickening. Negative appendix. Vascular/Lymphatic: Nonaneurysmal aorta. Subcentimeter retroperitoneal lymph nodes. No suspicious adenopathy Reproductive: Lobulated uterus. Posterior lower uterine segment heterogenous masses measuring up to 3.6 cm. Additional posterior fundal masses. 2.6 cm right adnexal cyst. No imaging follow-up for this finding is recommended. Other: Negative for free air or pelvic effusion. Musculoskeletal: No acute or significant osseous findings. IMPRESSION: 1. No CT evidence for acute intra-abdominal or pelvic abnormality. Fluid-filled nondilated mid to distal small bowel without convincing evidence for transition point/obstruction, query mild ileus. 2. Slightly thickened pyloric region which could be due to prominent contraction versus inflammation 3. Lobulated uterus with multiple masses presumably fibroids. Heterogenous lower uterine segment masses also probably  due to fibroids but suggest confirmation with nonemergent pelvic ultrasound Electronically Signed   By: Donavan Foil M.D.   On: 11/20/2022 19:18   DG Chest 2 View  Result Date: 11/20/2022 CLINICAL DATA:  Cough vomiting EXAM: CHEST - 2 VIEW COMPARISON:  07/16/2021, CT 08/03/2020, chest x-ray 08/20/2020 FINDINGS: Bilateral areas of bronchiectasis most notable in left upper lobe with stable ovoid opacity corresponding to cavitary process on prior imaging, no change. Slight increased diffuse heterogeneous interstitial and hazy pulmonary opacity. Stable cardiomediastinal silhouette. No pneumothorax IMPRESSION: 1. Slight increased diffuse heterogeneous interstitial and hazy pulmonary opacity compared to prior imaging, possible acute infectious or inflammatory process to include atypical or viral infection. 2. Similar appearance of bronchiectasis and ovoid opacity in the left upper lobe corresponding to cavitary process on prior imaging. Electronically Signed   By: Donavan Foil M.D.   On: 11/20/2022 16:51     Data Reviewed: Relevant notes from primary care and specialist visits, past discharge summaries as available in EHR, including Care Everywhere. Prior diagnostic testing as pertinent to current admission diagnoses Updated medications and problem lists for reconciliation ED course, including vitals, labs, imaging, treatment and response to treatment Triage notes, nursing and pharmacy notes and ED provider's notes Notable results as noted in HPI   Assessment and Plan:  Acute gastroenteritis Mild ileus and pyloric thickening on CT History of dysphagia Chronic NSAIDs Patient presented with vomiting and has a history of dysphagia x 1 year.  Takes NSAIDs for fibromyalgia -Will keep n.p.o. overnight  -GI panel and stool for C. difficile -Antiemetics, IV hydration, IV Protonix -Avoid NSAIDs -GI consult, given history of dysphagia with plans for upcoming upper and lower GI  Sinus  tachycardia Suspect SIRS related to acute illness TSH and free T4 normal Continue IV hydration and monitor   Respiratory tract infection History of sarcoidosis CT chest shows :Bilateral bronchial wall thickening and scattered ground-glass airspace disease, right greater than left, consistent with inflammatory or infectious etiology Patient was started on Rocephin and doxycycline in the ED Respiratory viral panel negative Sarcoidosis flare not suspected at this time.  Patient not currently on sarcoidosis meds. -Follow procalcitonin -Will continue antibiotics if procalcitonin positive -Antitussives, incentive spirometry, albuterol as needed   QT prolongation Avoid QT prolonging drugs  Graves disease TSH and free T4 WNL Followed by endocrinology, last seen 05/2022 when she was  weaned-10mg >5mg >2.5mg   Patient self discontinued methimazole  Fibromyalgia Continue Zanaflex.   Reports that she takes ibuprofen as needed Avoid NSAIDs  Sarcoidosis Acute flare not suspected.  Not currently on related medications     DVT prophylaxis: SCD  Consults: GI,  Dr. Virgina Jock  Advance Care Planning:   Code Status: Prior   Family Communication: none  Disposition Plan: Back to previous home environment  Severity of Illness: The appropriate patient status for this patient is OBSERVATION. Observation status is judged to be reasonable and necessary in order to provide the required intensity of service to ensure the patient's safety. The patient's presenting symptoms, physical exam findings, and initial radiographic and laboratory data in the context of their medical condition is felt to place them at decreased risk for further clinical deterioration. Furthermore, it is anticipated that the patient will be medically stable for discharge from the hospital within 2 midnights of admission.   Author: Athena Masse, MD 11/20/2022 8:37 PM  For on call review [email or web address].

## 2022-11-20 NOTE — ED Notes (Signed)
RN Maudie Mercury called and informed of pt coming to room and needing to have labs collected.

## 2022-11-20 NOTE — ED Notes (Signed)
Awaiting on RN response to transfer pt to the floor.

## 2022-11-20 NOTE — Assessment & Plan Note (Addendum)
Mild ileus and pyloric thickening on CT History of dysphagia Chronic NSAIDs Patient presented with vomiting and has a history of dysphagia x 1 year.  Takes NSAIDs for fibromyalgia -Will keep n.p.o. overnight  -GI panel and stool for C. difficile -Antiemetics, IV hydration, IV Protonix -Avoid NSAIDs -GI consult, given history of dysphagia with plans for upcoming upper and lower GI

## 2022-11-20 NOTE — Assessment & Plan Note (Signed)
Suspect SIRS Continue IV hydration

## 2022-11-21 ENCOUNTER — Encounter: Payer: Self-pay | Admitting: Internal Medicine

## 2022-11-21 ENCOUNTER — Observation Stay: Payer: Medicaid Other | Admitting: Anesthesiology

## 2022-11-21 ENCOUNTER — Encounter
Admission: EM | Disposition: A | Discharge status: Home / Self Care | Payer: Self-pay | Source: Home / Self Care | Attending: Internal Medicine

## 2022-11-21 DIAGNOSIS — Z1152 Encounter for screening for COVID-19: Secondary | ICD-10-CM | POA: Diagnosis not present

## 2022-11-21 DIAGNOSIS — Z79899 Other long term (current) drug therapy: Secondary | ICD-10-CM | POA: Diagnosis not present

## 2022-11-21 DIAGNOSIS — Z791 Long term (current) use of non-steroidal anti-inflammatories (NSAID): Secondary | ICD-10-CM | POA: Diagnosis not present

## 2022-11-21 DIAGNOSIS — E0500 Thyrotoxicosis with diffuse goiter without thyrotoxic crisis or storm: Secondary | ICD-10-CM | POA: Diagnosis present

## 2022-11-21 DIAGNOSIS — Z880 Allergy status to penicillin: Secondary | ICD-10-CM | POA: Diagnosis not present

## 2022-11-21 DIAGNOSIS — D860 Sarcoidosis of lung: Secondary | ICD-10-CM | POA: Diagnosis present

## 2022-11-21 DIAGNOSIS — R1314 Dysphagia, pharyngoesophageal phase: Secondary | ICD-10-CM | POA: Diagnosis present

## 2022-11-21 DIAGNOSIS — K567 Ileus, unspecified: Secondary | ICD-10-CM | POA: Diagnosis present

## 2022-11-21 DIAGNOSIS — Z91040 Latex allergy status: Secondary | ICD-10-CM | POA: Diagnosis not present

## 2022-11-21 DIAGNOSIS — K449 Diaphragmatic hernia without obstruction or gangrene: Secondary | ICD-10-CM | POA: Diagnosis present

## 2022-11-21 DIAGNOSIS — M797 Fibromyalgia: Secondary | ICD-10-CM | POA: Diagnosis present

## 2022-11-21 DIAGNOSIS — J45909 Unspecified asthma, uncomplicated: Secondary | ICD-10-CM | POA: Diagnosis present

## 2022-11-21 DIAGNOSIS — Z881 Allergy status to other antibiotic agents status: Secondary | ICD-10-CM | POA: Diagnosis not present

## 2022-11-21 DIAGNOSIS — F32A Depression, unspecified: Secondary | ICD-10-CM | POA: Diagnosis present

## 2022-11-21 DIAGNOSIS — R9431 Abnormal electrocardiogram [ECG] [EKG]: Secondary | ICD-10-CM | POA: Diagnosis present

## 2022-11-21 DIAGNOSIS — Z7951 Long term (current) use of inhaled steroids: Secondary | ICD-10-CM | POA: Diagnosis not present

## 2022-11-21 DIAGNOSIS — Z801 Family history of malignant neoplasm of trachea, bronchus and lung: Secondary | ICD-10-CM | POA: Diagnosis not present

## 2022-11-21 DIAGNOSIS — Z833 Family history of diabetes mellitus: Secondary | ICD-10-CM | POA: Diagnosis not present

## 2022-11-21 DIAGNOSIS — R000 Tachycardia, unspecified: Secondary | ICD-10-CM | POA: Diagnosis present

## 2022-11-21 DIAGNOSIS — K529 Noninfective gastroenteritis and colitis, unspecified: Secondary | ICD-10-CM | POA: Diagnosis present

## 2022-11-21 DIAGNOSIS — J069 Acute upper respiratory infection, unspecified: Secondary | ICD-10-CM | POA: Diagnosis present

## 2022-11-21 HISTORY — PX: ESOPHAGOGASTRODUODENOSCOPY (EGD) WITH PROPOFOL: SHX5813

## 2022-11-21 LAB — CBC
HCT: 38.6 % (ref 36.0–46.0)
Hemoglobin: 12.4 g/dL (ref 12.0–15.0)
MCH: 25.9 pg — ABNORMAL LOW (ref 26.0–34.0)
MCHC: 32.1 g/dL (ref 30.0–36.0)
MCV: 80.6 fL (ref 80.0–100.0)
Platelets: 188 10*3/uL (ref 150–400)
RBC: 4.79 MIL/uL (ref 3.87–5.11)
RDW: 14 % (ref 11.5–15.5)
WBC: 4.3 10*3/uL (ref 4.0–10.5)
nRBC: 0 % (ref 0.0–0.2)

## 2022-11-21 LAB — BASIC METABOLIC PANEL
Anion gap: 6 (ref 5–15)
BUN: 9 mg/dL (ref 6–20)
CO2: 23 mmol/L (ref 22–32)
Calcium: 7.7 mg/dL — ABNORMAL LOW (ref 8.9–10.3)
Chloride: 106 mmol/L (ref 98–111)
Creatinine, Ser: 0.64 mg/dL (ref 0.44–1.00)
GFR, Estimated: >60 mL/min (ref >60)
Glucose, Bld: 105 mg/dL — ABNORMAL HIGH (ref 70–99)
Potassium: 3.1 mmol/L — ABNORMAL LOW (ref 3.5–5.1)
Sodium: 135 mmol/L (ref 135–145)

## 2022-11-21 LAB — HIV ANTIBODY (ROUTINE TESTING W REFLEX): HIV Screen 4th Generation wRfx: NONREACTIVE

## 2022-11-21 SURGERY — ESOPHAGOGASTRODUODENOSCOPY (EGD) WITH PROPOFOL
Anesthesia: General

## 2022-11-21 MED ORDER — SODIUM CHLORIDE 0.9 % IV SOLN: INTRAVENOUS | Status: DC

## 2022-11-21 MED ORDER — PROPOFOL 10 MG/ML IV BOLUS
INTRAVENOUS | Status: AC
  Filled 2022-11-21: qty 20

## 2022-11-21 MED ORDER — LIDOCAINE HCL (CARDIAC) PF 100 MG/5ML IV SOSY
PREFILLED_SYRINGE | INTRAVENOUS | Status: DC | PRN
  Administered 2022-11-21: 40 mg via INTRAVENOUS

## 2022-11-21 MED ORDER — METOPROLOL TARTRATE 5 MG/5ML IV SOLN
5.0000 mg | Freq: Once | INTRAVENOUS | Status: AC
  Administered 2022-11-21: 5 mg via INTRAVENOUS
  Filled 2022-11-21: qty 5

## 2022-11-21 MED ORDER — GUAIFENESIN-DM 100-10 MG/5ML PO SYRP
5.0000 mL | ORAL_SOLUTION | ORAL | Status: DC | PRN
  Administered 2022-11-21: 5 mL via ORAL
  Filled 2022-11-21: qty 10

## 2022-11-21 MED ORDER — IPRATROPIUM-ALBUTEROL 0.5-2.5 (3) MG/3ML IN SOLN
3.0000 mL | Freq: Once | RESPIRATORY_TRACT | Status: AC
  Administered 2022-11-21: 3 mL via RESPIRATORY_TRACT

## 2022-11-21 MED ORDER — PROPOFOL 500 MG/50ML IV EMUL
INTRAVENOUS | Status: DC | PRN
  Administered 2022-11-21: 150 ug/kg/min via INTRAVENOUS

## 2022-11-21 MED ORDER — GLYCOPYRROLATE 0.2 MG/ML IJ SOLN
INTRAMUSCULAR | Status: DC | PRN
  Administered 2022-11-21: .2 mg via INTRAVENOUS

## 2022-11-21 MED ORDER — PROPOFOL 10 MG/ML IV BOLUS
INTRAVENOUS | Status: DC | PRN
  Administered 2022-11-21: 60 mg via INTRAVENOUS

## 2022-11-21 NOTE — Progress Notes (Signed)
Progress Note   Patient: Jennifer [name] [id number] DOB: 03-10-1971 DOA: 11/20/2022     0 DOS: the patient was seen and examined on 11/21/2022   Brief hospital course:   SELIA MCELMURRY is a 52 y.o. female with medical history significant for fibromyalgia, pulmonary sarcoidosis, hyperthyroidism on methimazole, depression who presents to the ED with a 1 day history of vomiting and crampy abdominal pain.  She also did follow-up diarrhea several hours after the vomiting started.  Prior to the onset of the symptoms patient had respiratory symptoms with a productive cough and nasal congestion and was started on azithromycin.  She said the vomiting started after taking the azithromycin.  She has had no chest pain, fever or chills and denies lower extremity pain or swelling.  Chart review reveals that patient was evaluated by gastroenterologist, Dr. Vicente Males on 11/05/2022 with a complaint of dysphagia and has an upcoming colonoscopy and upper endoscopy later this month and 3/20. ED course and data review:Persistently elevated heart rate in the 1 teens to 120s with otherwise normal vitals.  Normal WBC and negative respiratory viral panel.  CBC and CMP otherwise unremarkable.  Urinalysis significant for 5 ketones otherwise unremarkable.  TSH and free T4 WNL.  EKG, personally viewed and interpreted showing sinus tachycardia at 110 with nonspecific ST-T wave changes and prolonged QT interval. Imaging: Patient had extensive imaging including CTA chest and CT abdomen and pelvis with contrast that was significant for airspace disease, inflammation versus inflammatory as well as ileus and pyloric thickening (among other findings) as outlined below  3/16 : Patient plan for EGD today for CT finding and symptoms of generalized abdominal pain.  Assessment and Plan: Acute gastroenteritis Mild ileus and pyloric thickening on CT abdomen and pelvis History of dysphagia Chronic NSAIDs Patient presented with vomiting and  has a history of dysphagia x 1 year.  Takes NSAIDs for fibromyalgia -Will keep n.p.o. overnight  -GI panel and stool for C. difficile -Antiemetics, IV hydration, IV Protonix -Avoid NSAIDs -GI consult, given history of dysphagia with plans for upcoming upper and lower GI  Sinus tachycardia Suspect SIRS related to acute illness TSH and free T4 normal Continue IV hydration and monitor   Respiratory tract infection History of sarcoidosis CT chest shows :Bilateral bronchial wall thickening and scattered ground-glass airspace disease, right greater than left, consistent with inflammatory or infectious etiology Patient was started on Rocephin and doxycycline in the ED Respiratory viral panel negative Sarcoidosis flare not suspected at this time.  Patient not currently on sarcoidosis meds. -Follow procalcitonin -Will continue antibiotics if procalcitonin positive -Antitussives, incentive spirometry, albuterol as needed   QT prolongation Avoid QT prolonging drugs  Graves disease TSH and free T4 WNL Followed by endocrinology, last seen 05/2022 when she was  weaned-10mg >5mg >2.5mg   Patient self discontinued methimazole  Fibromyalgia Continue Zanaflex.   Reports that she takes ibuprofen as needed Avoid NSAIDs  Sarcoidosis Acute flare not suspected.  Not currently on related medications  Subjective: Patient seen and examined this morning.  No overnight events.  Continues to be symptomatic with epigastric generalized abdominal tenderness.   Physical Exam: Vitals:   11/21/22 0151 11/21/22 0542 11/21/22 0542 11/21/22 0958  BP: 137/79 121/69 121/69 120/85  Pulse: (!) 106 (!) 115 (!) 115 (!) 106  Resp: 16 16 16 18   Temp: 98.8 F (37.1 C) 99.7 F (37.6 C) 99.7 F (37.6 C) 98.9 F (37.2 C)  TempSrc:    Oral  SpO2: 97%  96% 96%  Weight:  Height:       Physical Exam Constitutional:      Appearance: Normal appearance.  HENT:     Head: Normocephalic.  Eyes:     Extraocular  Movements: Extraocular movements intact.     Pupils: Pupils are equal, round, and reactive to light.  Cardiovascular:     Rate and Rhythm: Normal rate.     Pulses: Normal pulses.  Pulmonary:     Effort: Pulmonary effort is normal.  Abdominal:     General: Abdomen is flat.     Tenderness: There is generalized abdominal tenderness and tenderness in the epigastric area.  Musculoskeletal:        General: Normal range of motion.     Cervical back: Normal range of motion.  Skin:    General: Skin is warm.  Neurological:     General: No focal deficit present.     Mental Status: She is alert.  Psychiatric:        Mood and Affect: Mood normal.     Data Reviewed:  There are no new results to review at this time.  Family Communication: None by bedside   Disposition: Status is: Observation The patient remains OBS appropriate and will d/c before 2 midnights.  Planned Discharge Destination: Home    Time spent: 35 minutes  Author: Oran Rein, MD 11/21/2022 11:01 AM  For on call review [email or web address].

## 2022-11-21 NOTE — Op Note (Signed)
[facility] Patient Name: Jennifer [name] Procedure Date: 11/21/2022 10:27 AM MRN: [id number] Account #: [id number] Date of Birth: 1970-09-24 Admit Type: Inpatient Age: 52 Room: [facility] ENDO ROOM 4 Gender: Female Note Status: Finalized Instrument Name: Upper Endoscope [id number] Procedure:             Upper GI endoscopy Indications:           Dysphagia Providers:             Annamaria Helling DO, DO Medicines:             Monitored Anesthesia Care Complications:         No immediate complications. Estimated blood loss:                         Minimal. Procedure:             Pre-Anesthesia Assessment:                        - Prior to the procedure, a History and Physical was                         performed, and patient medications and allergies were                         reviewed. The patient is competent. The risks and                         benefits of the procedure and the sedation options and                         risks were discussed with the patient. All questions                         were answered and informed consent was obtained.                         Patient identification and proposed procedure were                         verified by the physician, the nurse, the anesthetist                         and the technician in the endoscopy suite. Mental                         Status Examination: alert and oriented. Airway                         Examination: normal oropharyngeal airway and neck                         mobility. Respiratory Examination: clear to                         auscultation. CV Examination: RRR, no murmurs, no S3                         or S4. Prophylactic Antibiotics: The patient does not  require prophylactic antibiotics. Prior                         Anticoagulants: The patient has taken no anticoagulant                         or antiplatelet agents. ASA Grade Assessment: III - A                          patient with severe systemic disease. After reviewing                         the risks and benefits, the patient was deemed in                         satisfactory condition to undergo the procedure. The                         anesthesia plan was to use monitored anesthesia care                         (MAC). Immediately prior to administration of                         medications, the patient was re-assessed for adequacy                         to receive sedatives. The heart rate, respiratory                         rate, oxygen saturations, blood pressure, adequacy of                         pulmonary ventilation, and response to care were                         monitored throughout the procedure. The physical                         status of the patient was re-assessed after the                         procedure.                        After obtaining informed consent, the endoscope was                         passed under direct vision. Throughout the procedure,                         the patient's blood pressure, pulse, and oxygen                         saturations were monitored continuously. The Endoscope                         was introduced through the mouth, and advanced to the  second part of duodenum. The upper GI endoscopy was                         accomplished without difficulty. The patient tolerated                         the procedure well. Findings:      The duodenal bulb, first portion of the duodenum and second portion of       the duodenum were normal. Estimated blood loss: none.      A small hiatal hernia was present. Estimated blood loss: none.      Diffuse atrophic mucosa was found in the entire examined stomach.       Biopsies were taken with a cold forceps for Helicobacter pylori testing.       Estimated blood loss was minimal.      The exam of the stomach was otherwise normal.      The Z-line was regular.  Estimated blood loss: none.      Esophagogastric landmarks were identified: the gastroesophageal junction       was found at 30 cm from the incisors.      Normal mucosa was found in the entire esophagus. The scope was       withdrawn. Dilation was performed with a Maloney dilator with no       resistance at 48 Fr and 52 Fr. The dilation site was examined following       endoscope reinsertion and showed no change. Biopsies were obtained from       the proximal and distal esophagus with cold forceps for histology of       suspected eosinophilic esophagitis. Estimated blood loss was minimal.      The exam of the esophagus was otherwise normal. Impression:            - Normal duodenal bulb, first portion of the duodenum                         and second portion of the duodenum.                        - Small hiatal hernia.                        - Gastric mucosal atrophy. Biopsied.                        - Z-line regular.                        - Esophagogastric landmarks identified.                        - Normal mucosa was found in the entire esophagus.                         Dilated.                        - Biopsies were taken with a cold forceps for                         evaluation of eosinophilic esophagitis. Recommendation:        -  Patient has a contact number available for                         emergencies. The signs and symptoms of potential                         delayed complications were discussed with the patient.                         Return to normal activities tomorrow. Written                         discharge instructions were provided to the patient.                        - Return patient to hospital ward for ongoing care.                        - Advance diet as tolerated.                        - Continue present medications.                        - No ibuprofen, naproxen, or other non-steroidal                         anti-inflammatory drugs.                         - Use Prilosec (omeprazole) 40 mg PO daily.                        - Await pathology results.                        - Refer to a gastroenterologist as previously                         scheduled.                        - Colonoscopy as outpatient with Dr. Vicente Males                        - The findings and recommendations were discussed with                         the patient.                        - The findings and recommendations were discussed with                         the referring physician. Procedure Code(s):     --- Professional ---                        [phone], Esophagogastroduodenoscopy, flexible,                         transoral; with biopsy, single or multiple  [id number], Dilation of esophagus, by unguided sound or                         bougie, single or multiple passes Diagnosis Code(s):     --- Professional ---                        K44.9, Diaphragmatic hernia without obstruction or                         gangrene                        K31.89, Other diseases of stomach and duodenum                        R13.10, Dysphagia, unspecified CPT copyright 2022 [employer]. All rights reserved. The codes documented in this report are preliminary and upon coder review may  be revised to meet current compliance requirements. Attending Participation:      I personally performed the entire procedure. Volney American, DO Annamaria Helling DO, DO 11/21/2022 11:47:03 AM This report has been signed electronically. Number of Addenda: 0 Note Initiated On: 11/21/2022 10:27 AM Estimated Blood Loss:  Estimated blood loss was minimal.      [facility]

## 2022-11-21 NOTE — Consult Note (Signed)
GI Inpatient Consult Note  Reason for Consult: Dysphagia, Abnormal CT   Attending Requesting Consult: Dr. Judd Gaudier, MD  History of Present Illness: Jennifer [name] is a 52 y.o. female seen for evaluation of dysphagia and abnormal CT scan at the request of admitting hospitalist - Dr. Judd Gaudier. Patient has a PMH of hyperthyroidism, pulmonary sarcoidosis, asthma, fibromyalgia, anxiety, and depression. She presented to the [facility] ED yesterday afternoon for chief complaint of 1-day history of vomiting, crampy abdominal pain, and diarrhea. Prior to onset of her GI symptoms patient had upper respiratory symptoms with productive cough and nasal congestion and was started on azithromycin by her primary team. Vomiting started after taking antibiotic. Upon presentation to the ED, she was tachycardic (120s) with otherwise normal vital signs. Labs significant for normal WBC, normal BMP, normal LFTs, negative respiratory panel, and negative UDS. CTA chest negative for PE, but did comment on bilateral bronchial wall thickening and scattered groud-glass airspace disease, right > left c/w inflammatory or infectious etiology. CT abd/pelvis commented on fluid-filled nondilated mid to distal small bowel without convincing evidence of transition point or obstruction. Radiologist commented on slightly thickening pyloric region which could be 2/2 prominent contraction versus inflammation. She was treated with IV fluid boluses, antiemetics, ceftriaxone, and doxycycline. She was admitted for concerns of persistent sinus tachycardia in spite of fluid boluses and limited outpatient options for antiemetics given QTc prolongation. GI consulted for abnormal CT scan and dysphagia.   Patient seen and evaluated this morning. No acute events overnight. She denies any further emesis of emesis or diarrhea since admission. She is not currently nauseous. Abdominal pain has improved overnight. She was seen and evaluated as an  outpatient by Dr. Jonathon Bellows just over two weeks ago for 1-year history of dysphagia to solids and liquids localized at the suprasternal notch and colon cancer screening. She is currently scheduled for bidirectional endoscopies with Dr Vicente Males next week on 3/20. She endorses issues with solids and liquids (50% 50%) localized to suprasternal notch and upper sternum. She sometimes feels like foods are hanging up here and won't pass unless she drinks a lot of water with it and sometimes feels like foods go down the wrong way. Liquids can strangle her, especially really cold ones. She has hx of GERD and has been taking Prilosec after a meal, but not on a daily basis. She denies any odynophagia, early satiety, anorexia, or unintentional weight loss. She has never had an EGD or colonoscopy. She denies any illicit drug use, tobacco use, or alcohol use. She does take ibuprofen 600 mg BID for headaches and myalgias.   Past Medical History:  Past Medical History:  Diagnosis Date   Anxiety    Asthma    Chest pain    a. 08/2018 MV: EF 62%, no ischemia/scar.   Depression    Diastolic dysfunction    a. 09/2017 Echo: Nl LV size/fxn/wall thickness; b. 07/2020 Echo: EF 60-65%, no rwma, Gr1 DD. Nl RV size/fxn. Mild MR.   Fibromyalgia    Hyperthyroidism    Incontinence    Lymphadenopathy    Pneumonia    bilateral community acquired    PVC's (premature ventricular contractions)    a. 06/2016 24h Holter: Sinus rhythm, freq PVCs (13% burden), rare PACs; b. 03/2017 24h Holter: Predominantly sinus rhythm. Freq PVCs (6% burden); c. 08/2020 cMRI: EF 50%. Nl LV size/thickness. No LGE to suggest scar/infiltration. Nl RV size/fxn. Nl biatrial sizes. No significant valvular abnormalities.   Sarcoidosis  Sepsis ([place])    Shortness of breath dyspnea    Tachycardia     Problem List: Patient Active Problem List   Diagnosis Date Noted   Ileus ([place]) 11/20/2022   Abdominal pain, vomiting, and diarrhea 11/20/2022   Acute  gastroenteritis 11/20/2022   Respiratory tract infection 11/20/2022   QT prolongation 11/20/2022   Graves disease 11/20/2022   NSAID long-term use 11/20/2022   Plantar fasciitis of left foot 03/02/2022   Plantar fasciitis of right foot 03/02/2022   Knee pain, bilateral 02/16/2022   Pain in joint of right ankle 02/16/2022   Contusion of left knee 02/16/2022   Contusion of right knee 02/16/2022   Fibromyalgia 02/16/2022   Sprain of right ankle 02/16/2022   Pain in right foot 02/16/2022   Sepsis ([place]) 08/03/2020   Hyperthyroidism    Depression    Atypical chest pain 09/15/2017   PVC's (premature ventricular contractions) 09/15/2017   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   Sarcoidosis 03/23/2016   Pneumonia due to Pseudomonas ([place]) 02/10/2016   Pulmonary cavitary lesion    Hilar adenopathy    Current use of steroid medication 06/17/2015   Long term current use of systemic steroids 01/03/2015   Sinus tachycardia 01/03/2015   Fungal pneumonia 11/23/2014   Cough 11/22/2014   Shortness of breath 11/22/2014   Cavitary lesion of lung 11/22/2014    Past Surgical History: Past Surgical History:  Procedure Laterality Date   CESAREAN SECTION     1992/1994   ENDOBRONCHIAL ULTRASOUND N/A 01/27/2016   Procedure: ENDOBRONCHIAL ULTRASOUND;  Surgeon: Vilinda Boehringer, MD;  Location: [facility] ORS;  Service: Cardiopulmonary;  Laterality: N/A;   LYMPH NODE BIOPSY     VIDEO BRONCHOSCOPY N/A 01/27/2016   Procedure: VIDEO BRONCHOSCOPY WITH FLUORO;  Surgeon: Vilinda Boehringer, MD;  Location: [facility] ORS;  Service: Cardiopulmonary;  Laterality: N/A;    Allergies: Allergies  Allergen Reactions   Azithromycin Nausea And Vomiting    Also noted to have prolong qtc on EKG after    Latex Itching   Penicillin G Hives    Home Medications: Medications Prior to Admission  Medication Sig Dispense Refill Last Dose   acetaminophen (TYLENOL) 500 MG tablet Take 500-1,000 mg by mouth every 6 (six) hours as needed for mild pain  or fever.    unknown   ADVAIR DISKUS 250-50 MCG/DOSE AEPB Inhale 1 puff into the lungs 2 (two) times daily. 60 each 5 unknown   azithromycin (ZITHROMAX) 250 MG tablet Take 250 mg by mouth daily.   11/19/2022   divalproex (DEPAKOTE ER) 500 MG 24 hr tablet Take 500 mg by mouth daily.    unknown   ibuprofen (ADVIL) 200 MG tablet Take 400-600 mg by mouth every 6 (six) hours as needed for fever or mild pain.    unknown   meloxicam (MOBIC) 7.5 MG tablet Take One tab PO BID with food   unknown   methimazole (TAPAZOLE) 10 MG tablet Take 10 mg by mouth daily.   Past Week   methocarbamol (ROBAXIN) 500 MG tablet Take 500-1,000 mg by mouth 3 (three) times daily. Two to three times daily   Past Week   naproxen (NAPROSYN) 500 MG tablet Take 500 mg by mouth daily.   Past Week   omeprazole (PRILOSEC) 40 MG capsule Take 1 capsule (40 mg total) by mouth in the morning and at bedtime. 180 capsule 0 Past Week   ondansetron (ZOFRAN) 4 MG tablet Take 1 tablet (4 mg total) by mouth every 6 (six) hours as  needed for nausea or vomiting. 20 tablet 1 unknown   predniSONE (DELTASONE) 20 MG tablet    Past Week   PROAIR HFA 108 (90 Base) MCG/ACT inhaler Inhale 1 puff into the lungs every 6 (six) hours as needed. 18 g 5 unknown   rosuvastatin (CRESTOR) 20 MG tablet Take 20 mg by mouth daily.   Past Week   tiZANidine (ZANAFLEX) 2 MG tablet    Past Week   traMADol (ULTRAM) 50 MG tablet Take 1 tablet by mouth every 6 (six) hours as needed.   Past Week   bisoprolol (ZEBETA) 10 MG tablet Take 2 tablets (20 mg total) by mouth daily. 60 tablet 5    polyethylene glycol powder (GLYCOLAX/MIRALAX) 17 GM/SCOOP powder Take 17 g by mouth as needed. 255 g 0 unknown   SAPHRIS 5 MG SUBL 24 hr tablet Place 1 tablet (5 mg total) under the tongue daily. (Patient not taking: Reported on 11/20/2022) 60 tablet 0 Not Taking   Vitamin D, Cholecalciferol, 25 MCG (1000 UT) TABS Take 500 Units by mouth daily. (Patient not taking: Reported on 11/20/2022) 60  tablet  Not Taking   Home medication reconciliation was completed with the patient.   Scheduled Inpatient Medications:    pantoprazole (PROTONIX) IV  40 mg Intravenous Q24H    Continuous Inpatient Infusions:    lactated ringers 125 mL/hr at 11/21/22 0641    PRN Inpatient Medications:  acetaminophen **OR** acetaminophen, methocarbamol, morphine injection, prochlorperazine  Family History: family history includes Diabetes in her sister; Lung cancer in her mother.  The patient's family history is negative for inflammatory bowel disorders, GI malignancy, or solid organ transplantation.  Social History:   reports that she has never smoked. She has never used smokeless tobacco. She reports that she does not drink alcohol and does not use drugs. The patient denies ETOH, tobacco, or drug use.   Review of Systems: Constitutional: Weight is stable.  Eyes: No changes in vision. ENT: No oral lesions, sore throat.  GI: see HPI.  Heme/Lymph: No easy bruising.  CV: No chest pain.  GU: No hematuria.  Integumentary: No rashes.  Neuro: No headaches.  Psych: No depression/anxiety.  Endocrine: No heat/cold intolerance.  Allergic/Immunologic: No urticaria.  Resp: No cough, SOB.  Musculoskeletal: No joint swelling.    Physical Examination: BP 121/69 (BP Location: Left Arm)   Pulse (!) 115   Temp 99.7 F (37.6 C)   Resp 16   Ht 4\' 9"  (1.448 m)   Wt 62.6 kg   LMP 09/30/2020   SpO2 96%   BMI 29.86 kg/m  Gen: NAD, alert and oriented x 4 HEENT: PEERLA, EOMI, Neck: supple, no JVD or thyromegaly Chest: CTA bilaterally, no wheezes, crackles, or other adventitious sounds CV: RRR, no m/g/c/r Abd: soft, NT, ND, +BS in all four quadrants; no HSM, guarding, ridigity, or rebound tenderness Ext: no edema, well perfused with 2+ pulses, Skin: no rash or lesions noted Lymph: no LAD  Data: Lab Results  Component Value Date   WBC 4.3 11/21/2022   HGB 12.4 11/21/2022   HCT 38.6 11/21/2022    MCV 80.6 11/21/2022   PLT 188 11/21/2022   Recent Labs  Lab 11/20/22 1535 11/21/22 0357  HGB 14.5 12.4   Lab Results  Component Value Date   NA 135 11/21/2022   K 3.1 (L) 11/21/2022   CL 106 11/21/2022   CO2 23 11/21/2022   BUN 9 11/21/2022   CREATININE 0.64 11/21/2022   Lab Results  Component  Value Date   ALT 15 11/20/2022   AST 19 11/20/2022   ALKPHOS 88 11/20/2022   BILITOT 0.7 11/20/2022   No results for input(s): "APTT", "INR", "PTT" in the last 168 hours.  CT abd/pelvis with contrast 11/20/22: IMPRESSION: 1. No CT evidence for acute intra-abdominal or pelvic abnormality. Fluid-filled nondilated mid to distal small bowel without convincing evidence for transition point/obstruction, query mild ileus. 2. Slightly thickened pyloric region which could be due to prominent contraction versus inflammation 3. Lobulated uterus with multiple masses presumably fibroids. Heterogenous lower uterine segment masses also probably due to fibroids but suggest confirmation with nonemergent pelvic ultrasound  Assessment/Plan:  52 y/o AA female with a PMH of hyperthyroidism, pulmonary sarcoidosis, asthma, fibromyalgia, anxiety, and depression presented to the [facility] ED yesterday afternoon for chief complaint of 1-day history of vomiting and crampy abdominal pain and upper respiratory tract infection.   Non-intractable vomiting - ddx includes self-limited viral or bacterial gastroenteritis, peptic ulcer disease, gastritis +/- H pylori +/- NSAID-induced, duodenitis, esophagitis, pGOO, malignancy, polyp, gastroparesis, etc  Esophageal dysphagia to solids and liquids x 1-year - ddx includes esophageal dysmotility/presbyesophagus, EoE, esophagitis, esophageal stricture, esophageal web/ring, Zenker's diverticulum, infectious esophagitis, etc  Abnormal CT scan - pyloric thickening possible prominent contraction versus inflammation   Acute gastroenteritis   URI  Sinus  tachycardia  Pulmonary sarcoidosis   Daily use of NSAIDs - ibuprofen 600 mg BID  Recommendations:  - Continue supportive care with IV antiemetics, pain control, and IV fluids - Respiratory status improved overnight. Clinically, patient appears stable with no refractory vomiting or diarrhea.  - Advise EGD for luminal evaluation for indications of dysphagia and abnormal CT - Defer colonoscopy at this time. She can likely f/u with Dr. Vicente Males next week 3/20 for screening colonoscopy - Avoid NSAIDs - Follow-up results of stool studies to r/o infection - Continue management of other medical comorbidities per primary team - See EGD procedure note for findings and further recommendations - NPO  I reviewed the risks (including bleeding, perforation, infection, anesthesia complications, cardiac/respiratory complications), benefits and alternatives of EGD. Patient consents to proceed.    Thank you for the consult. Please call with questions or concerns.  Geanie Kenning, PA-C [facility] [phone]

## 2022-11-21 NOTE — Anesthesia Postprocedure Evaluation (Signed)
Anesthesia Post Note  Patient: Jennifer [name]  Procedure(s) Performed: ESOPHAGOGASTRODUODENOSCOPY (EGD) WITH PROPOFOL  Patient location during evaluation: PACU Anesthesia Type: General Level of consciousness: awake and alert Pain management: pain level controlled Vital Signs Assessment: post-procedure vital signs reviewed and stable Respiratory status: spontaneous breathing, nonlabored ventilation, respiratory function stable and patient connected to nasal cannula oxygen Cardiovascular status: blood pressure returned to baseline and stable Postop Assessment: no apparent nausea or vomiting Anesthetic complications: no   No notable events documented.   Last Vitals:  Vitals:   11/21/22 1200 11/21/22 1238  BP: 127/70 121/87  Pulse: (!) 116 (!) 114  Resp: 19 18  Temp:  36.6 C  SpO2: 98% 100%    Last Pain:  Vitals:   11/21/22 1200  TempSrc:   PainSc: 4                  Arita Miss

## 2022-11-21 NOTE — Transfer of Care (Signed)
Immediate Anesthesia Transfer of Care Note  Patient: Jennifer [name]  Procedure(s) Performed: Procedure(s): ESOPHAGOGASTRODUODENOSCOPY (EGD) WITH PROPOFOL (N/A)  Patient Location: PACU and Endoscopy Unit  Anesthesia Type:General  Level of Consciousness: sedated  Airway & Oxygen Therapy: Patient Spontanous Breathing and Patient connected to nasal cannula oxygen  Post-op Assessment: Report given to RN and Post -op Vital signs reviewed and stable  Post vital signs: Reviewed and stable  Last Vitals:  Vitals:   11/21/22 1145 11/21/22 1146  BP: 123/73 123/73  Pulse: (!) 116 (!) 116  Resp: (!) 24 (!) 26  Temp: 36.9 C   SpO2: [id number]    Complications: No apparent anesthesia complications

## 2022-11-21 NOTE — Progress Notes (Signed)
GI postop note  Please see op note for further details EGD relatively unremarkable.  Biopsies taken for H. pylori and EOE Empirically dilated with 48 and 52 [place] without change No endoscopic findings supporting CT reported thickening of pylorus  Continue once daily p.o. PPI Advance diet as tolerated Continue follow-up with her primary GI for Outpatient colonoscopy Findings communicated with the hospitalist team and the patient  GI to sign off. Available as needed. Please do not hesitate to call regarding questions or concerns.  Ronne Binning, [facility]

## 2022-11-21 NOTE — Anesthesia Preprocedure Evaluation (Signed)
Anesthesia Evaluation  Patient identified by MRN, date of birth, ID band Patient awake  General Assessment Comment:  Patient presented with vomiting, poor PO intolerance. NPO appropriate, denies N/V today. CT scan showing some fluid in mid-distal small intestine, but no evidence of obstruction. No fluid in stomach.  Reviewed: Allergy & Precautions, NPO status , Patient's Chart, lab work & pertinent test results  History of Anesthesia Complications Negative for: history of anesthetic complications  Airway Mallampati: II       Dental  (+) Teeth Intact   Pulmonary shortness of breath and with exertion, asthma , neg sleep apnea, neg COPD, Recent URI , Residual Cough, Patient abstained from smoking.Not current smoker Patient with active URI, with cough and congestion.   breath sounds clear to auscultation + decreased breath sounds      Cardiovascular Exercise Tolerance: Good METShypertension, Pt. on home beta blockers (-) CAD and (-) Past MI (-) dysrhythmias  Rhythm:Regular Rate:Normal     Neuro/Psych  PSYCHIATRIC DISORDERS Anxiety Depression    negative neurological ROS     GI/Hepatic Neg liver ROS,neg GERD  ,,  Endo/Other  neg diabetes Hyperthyroidism   Renal/GU negative Renal ROS     Musculoskeletal  (+)  Fibromyalgia -  Abdominal Normal abdominal exam  (+)   Peds  Hematology negative hematology ROS (+)   Anesthesia Other Findings Past Medical History: No date: Anxiety No date: Asthma No date: Chest pain     Comment:  a. 08/2018 MV: EF 62%, no ischemia/scar. No date: Depression No date: Diastolic dysfunction     Comment:  a. 09/2017 Echo: Nl LV size/fxn/wall thickness; b.               07/2020 Echo: EF 60-65%, no rwma, Gr1 DD. Nl RV size/fxn.              Mild MR. No date: Fibromyalgia No date: Hyperthyroidism No date: Incontinence No date: Lymphadenopathy No date: Pneumonia     Comment:  bilateral community  acquired  No date: PVC's (premature ventricular contractions)     Comment:  a. 06/2016 24h Holter: Sinus rhythm, freq PVCs (13%               burden), rare PACs; b. 03/2017 24h Holter: Predominantly               sinus rhythm. Freq PVCs (6% burden); c. 08/2020 cMRI: EF               50%. Nl LV size/thickness. No LGE to suggest               scar/infiltration. Nl RV size/fxn. Nl biatrial sizes. No               significant valvular abnormalities. No date: Sarcoidosis No date: Sepsis ([place]) No date: Shortness of breath dyspnea No date: Tachycardia  Reproductive/Obstetrics                              Anesthesia Physical Anesthesia Plan  ASA: 3 and emergent  Anesthesia Plan: General   Post-op Pain Management: Minimal or no pain anticipated   Induction: Intravenous  PONV Risk Score and Plan: 3 and Propofol infusion, TIVA and Ondansetron  Airway Management Planned: Natural Airway  Additional Equipment: None  Intra-op Plan:   Post-operative Plan:   Informed Consent: I have reviewed the patients History and Physical, chart, labs and discussed the procedure including the risks, benefits  and alternatives for the proposed anesthesia with the patient or authorized representative who has indicated his/her understanding and acceptance.     Dental advisory given  Plan Discussed with: CRNA  Anesthesia Plan Comments: (Given no evidence of obstruction on CT scan, and patient denying nausea or vomiting today, I believe it is reasonable to proceed with natural airway, especially given patient's concurrent URI symptoms, which would be exacerbated with endotracheal intubation. Dr Virgina Jock GI believes this case unable to wait weeks for resolution of URI symptoms due to patient's inability to take PO.  Discussed risks of anesthesia with patient, including possibility of difficulty with spontaneous ventilation under anesthesia necessitating airway intervention, PONV,  aspiration, and rare risks such as cardiac or respiratory or neurological events, and allergic reactions. Discussed the role of CRNA in patient's perioperative care. Patient understands.)         Anesthesia Quick Evaluation

## 2022-11-21 NOTE — Interval H&P Note (Signed)
History and Physical Interval Note: Preprocedure H&P from 11/21/22  was reviewed and there was no interval change after seeing and examining the patient.  Written consent was obtained from the patient after discussion of risks, benefits, and alternatives. Patient has consented to proceed with Esophagogastroduodenoscopy with possible intervention  Given her poor po intolerance, dysphagia, and abnormal ct scan, performing EGD to evaluate currently outweighs upper respiratory symptoms. This was discussed with the patient by both myself and anesthesia and the patient is willing to undergo the procedure.  11/21/2022 11:08 AM  Jennifer [name]  has presented today for surgery, with the diagnosis of Non-intractable vomiting, esophageal dysphagia, abnormal CT scan.  The various methods of treatment have been discussed with the patient and family. After consideration of risks, benefits and other options for treatment, the patient has consented to  Procedure(s): ESOPHAGOGASTRODUODENOSCOPY (EGD) WITH PROPOFOL (N/A) as a surgical intervention.  The patient's history has been reviewed, patient examined, no change in status, stable for surgery.  I have reviewed the patient's chart and labs.  Questions were answered to the patient's satisfaction.     Annamaria Helling

## 2022-11-21 NOTE — H&P (View-Only) (Signed)
GI Inpatient Consult Note  Reason for Consult: Dysphagia, Abnormal CT   Attending Requesting Consult: Dr. Judd Gaudier, MD  History of Present Illness: Jennifer [name] is a 52 y.o. female seen for evaluation of dysphagia and abnormal CT scan at the request of admitting hospitalist - Dr. Judd Gaudier. Patient has a PMH of hyperthyroidism, pulmonary sarcoidosis, asthma, fibromyalgia, anxiety, and depression. She presented to the [facility] ED yesterday afternoon for chief complaint of 1-day history of vomiting, crampy abdominal pain, and diarrhea. Prior to onset of her GI symptoms patient had upper respiratory symptoms with productive cough and nasal congestion and was started on azithromycin by her primary team. Vomiting started after taking antibiotic. Upon presentation to the ED, she was tachycardic (120s) with otherwise normal vital signs. Labs significant for normal WBC, normal BMP, normal LFTs, negative respiratory panel, and negative UDS. CTA chest negative for PE, but did comment on bilateral bronchial wall thickening and scattered groud-glass airspace disease, right > left c/w inflammatory or infectious etiology. CT abd/pelvis commented on fluid-filled nondilated mid to distal small bowel without convincing evidence of transition point or obstruction. Radiologist commented on slightly thickening pyloric region which could be 2/2 prominent contraction versus inflammation. She was treated with IV fluid boluses, antiemetics, ceftriaxone, and doxycycline. She was admitted for concerns of persistent sinus tachycardia in spite of fluid boluses and limited outpatient options for antiemetics given QTc prolongation. GI consulted for abnormal CT scan and dysphagia.   Patient seen and evaluated this morning. No acute events overnight. She denies any further emesis of emesis or diarrhea since admission. She is not currently nauseous. Abdominal pain has improved overnight. She was seen and evaluated as an  outpatient by Dr. Jonathon Bellows just over two weeks ago for 1-year history of dysphagia to solids and liquids localized at the suprasternal notch and colon cancer screening. She is currently scheduled for bidirectional endoscopies with Dr Vicente Males next week on 3/20. She endorses issues with solids and liquids (50% 50%) localized to suprasternal notch and upper sternum. She sometimes feels like foods are hanging up here and won't pass unless she drinks a lot of water with it and sometimes feels like foods go down the wrong way. Liquids can strangle her, especially really cold ones. She has hx of GERD and has been taking Prilosec after a meal, but not on a daily basis. She denies any odynophagia, early satiety, anorexia, or unintentional weight loss. She has never had an EGD or colonoscopy. She denies any illicit drug use, tobacco use, or alcohol use. She does take ibuprofen 600 mg BID for headaches and myalgias.   Past Medical History:  Past Medical History:  Diagnosis Date   Anxiety    Asthma    Chest pain    a. 08/2018 MV: EF 62%, no ischemia/scar.   Depression    Diastolic dysfunction    a. 09/2017 Echo: Nl LV size/fxn/wall thickness; b. 07/2020 Echo: EF 60-65%, no rwma, Gr1 DD. Nl RV size/fxn. Mild MR.   Fibromyalgia    Hyperthyroidism    Incontinence    Lymphadenopathy    Pneumonia    bilateral community acquired    PVC's (premature ventricular contractions)    a. 06/2016 24h Holter: Sinus rhythm, freq PVCs (13% burden), rare PACs; b. 03/2017 24h Holter: Predominantly sinus rhythm. Freq PVCs (6% burden); c. 08/2020 cMRI: EF 50%. Nl LV size/thickness. No LGE to suggest scar/infiltration. Nl RV size/fxn. Nl biatrial sizes. No significant valvular abnormalities.   Sarcoidosis  Sepsis ([place])    Shortness of breath dyspnea    Tachycardia     Problem List: Patient Active Problem List   Diagnosis Date Noted   Ileus ([place]) 11/20/2022   Abdominal pain, vomiting, and diarrhea 11/20/2022   Acute  gastroenteritis 11/20/2022   Respiratory tract infection 11/20/2022   QT prolongation 11/20/2022   Graves disease 11/20/2022   NSAID long-term use 11/20/2022   Plantar fasciitis of left foot 03/02/2022   Plantar fasciitis of right foot 03/02/2022   Knee pain, bilateral 02/16/2022   Pain in joint of right ankle 02/16/2022   Contusion of left knee 02/16/2022   Contusion of right knee 02/16/2022   Fibromyalgia 02/16/2022   Sprain of right ankle 02/16/2022   Pain in right foot 02/16/2022   Sepsis ([place]) 08/03/2020   Hyperthyroidism    Depression    Atypical chest pain 09/15/2017   PVC's (premature ventricular contractions) 09/15/2017   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   Sarcoidosis 03/23/2016   Pneumonia due to Pseudomonas ([place]) 02/10/2016   Pulmonary cavitary lesion    Hilar adenopathy    Current use of steroid medication 06/17/2015   Long term current use of systemic steroids 01/03/2015   Sinus tachycardia 01/03/2015   Fungal pneumonia 11/23/2014   Cough 11/22/2014   Shortness of breath 11/22/2014   Cavitary lesion of lung 11/22/2014    Past Surgical History: Past Surgical History:  Procedure Laterality Date   CESAREAN SECTION     1992/1994   ENDOBRONCHIAL ULTRASOUND N/A 01/27/2016   Procedure: ENDOBRONCHIAL ULTRASOUND;  Surgeon: Vilinda Boehringer, MD;  Location: [facility] ORS;  Service: Cardiopulmonary;  Laterality: N/A;   LYMPH NODE BIOPSY     VIDEO BRONCHOSCOPY N/A 01/27/2016   Procedure: VIDEO BRONCHOSCOPY WITH FLUORO;  Surgeon: Vilinda Boehringer, MD;  Location: [facility] ORS;  Service: Cardiopulmonary;  Laterality: N/A;    Allergies: Allergies  Allergen Reactions   Azithromycin Nausea And Vomiting    Also noted to have prolong qtc on EKG after    Latex Itching   Penicillin G Hives    Home Medications: Medications Prior to Admission  Medication Sig Dispense Refill Last Dose   acetaminophen (TYLENOL) 500 MG tablet Take 500-1,000 mg by mouth every 6 (six) hours as needed for mild pain  or fever.    unknown   ADVAIR DISKUS 250-50 MCG/DOSE AEPB Inhale 1 puff into the lungs 2 (two) times daily. 60 each 5 unknown   azithromycin (ZITHROMAX) 250 MG tablet Take 250 mg by mouth daily.   11/19/2022   divalproex (DEPAKOTE ER) 500 MG 24 hr tablet Take 500 mg by mouth daily.    unknown   ibuprofen (ADVIL) 200 MG tablet Take 400-600 mg by mouth every 6 (six) hours as needed for fever or mild pain.    unknown   meloxicam (MOBIC) 7.5 MG tablet Take One tab PO BID with food   unknown   methimazole (TAPAZOLE) 10 MG tablet Take 10 mg by mouth daily.   Past Week   methocarbamol (ROBAXIN) 500 MG tablet Take 500-1,000 mg by mouth 3 (three) times daily. Two to three times daily   Past Week   naproxen (NAPROSYN) 500 MG tablet Take 500 mg by mouth daily.   Past Week   omeprazole (PRILOSEC) 40 MG capsule Take 1 capsule (40 mg total) by mouth in the morning and at bedtime. 180 capsule 0 Past Week   ondansetron (ZOFRAN) 4 MG tablet Take 1 tablet (4 mg total) by mouth every 6 (six) hours as  needed for nausea or vomiting. 20 tablet 1 unknown   predniSONE (DELTASONE) 20 MG tablet    Past Week   PROAIR HFA 108 (90 Base) MCG/ACT inhaler Inhale 1 puff into the lungs every 6 (six) hours as needed. 18 g 5 unknown   rosuvastatin (CRESTOR) 20 MG tablet Take 20 mg by mouth daily.   Past Week   tiZANidine (ZANAFLEX) 2 MG tablet    Past Week   traMADol (ULTRAM) 50 MG tablet Take 1 tablet by mouth every 6 (six) hours as needed.   Past Week   bisoprolol (ZEBETA) 10 MG tablet Take 2 tablets (20 mg total) by mouth daily. 60 tablet 5    polyethylene glycol powder (GLYCOLAX/MIRALAX) 17 GM/SCOOP powder Take 17 g by mouth as needed. 255 g 0 unknown   SAPHRIS 5 MG SUBL 24 hr tablet Place 1 tablet (5 mg total) under the tongue daily. (Patient not taking: Reported on 11/20/2022) 60 tablet 0 Not Taking   Vitamin D, Cholecalciferol, 25 MCG (1000 UT) TABS Take 500 Units by mouth daily. (Patient not taking: Reported on 11/20/2022) 60  tablet  Not Taking   Home medication reconciliation was completed with the patient.   Scheduled Inpatient Medications:    pantoprazole (PROTONIX) IV  40 mg Intravenous Q24H    Continuous Inpatient Infusions:    lactated ringers 125 mL/hr at 11/21/22 0641    PRN Inpatient Medications:  acetaminophen **OR** acetaminophen, methocarbamol, morphine injection, prochlorperazine  Family History: family history includes Diabetes in her sister; Lung cancer in her mother.  The patient's family history is negative for inflammatory bowel disorders, GI malignancy, or solid organ transplantation.  Social History:   reports that she has never smoked. She has never used smokeless tobacco. She reports that she does not drink alcohol and does not use drugs. The patient denies ETOH, tobacco, or drug use.   Review of Systems: Constitutional: Weight is stable.  Eyes: No changes in vision. ENT: No oral lesions, sore throat.  GI: see HPI.  Heme/Lymph: No easy bruising.  CV: No chest pain.  GU: No hematuria.  Integumentary: No rashes.  Neuro: No headaches.  Psych: No depression/anxiety.  Endocrine: No heat/cold intolerance.  Allergic/Immunologic: No urticaria.  Resp: No cough, SOB.  Musculoskeletal: No joint swelling.    Physical Examination: BP 121/69 (BP Location: Left Arm)   Pulse (!) 115   Temp 99.7 F (37.6 C)   Resp 16   Ht 4\' 9"  (1.448 m)   Wt 62.6 kg   LMP 09/30/2020   SpO2 96%   BMI 29.86 kg/m  Gen: NAD, alert and oriented x 4 HEENT: PEERLA, EOMI, Neck: supple, no JVD or thyromegaly Chest: CTA bilaterally, no wheezes, crackles, or other adventitious sounds CV: RRR, no m/g/c/r Abd: soft, NT, ND, +BS in all four quadrants; no HSM, guarding, ridigity, or rebound tenderness Ext: no edema, well perfused with 2+ pulses, Skin: no rash or lesions noted Lymph: no LAD  Data: Lab Results  Component Value Date   WBC 4.3 11/21/2022   HGB 12.4 11/21/2022   HCT 38.6 11/21/2022    MCV 80.6 11/21/2022   PLT 188 11/21/2022   Recent Labs  Lab 11/20/22 1535 11/21/22 0357  HGB 14.5 12.4   Lab Results  Component Value Date   NA 135 11/21/2022   K 3.1 (L) 11/21/2022   CL 106 11/21/2022   CO2 23 11/21/2022   BUN 9 11/21/2022   CREATININE 0.64 11/21/2022   Lab Results  Component  Value Date   ALT 15 11/20/2022   AST 19 11/20/2022   ALKPHOS 88 11/20/2022   BILITOT 0.7 11/20/2022   No results for input(s): "APTT", "INR", "PTT" in the last 168 hours.  CT abd/pelvis with contrast 11/20/22: IMPRESSION: 1. No CT evidence for acute intra-abdominal or pelvic abnormality. Fluid-filled nondilated mid to distal small bowel without convincing evidence for transition point/obstruction, query mild ileus. 2. Slightly thickened pyloric region which could be due to prominent contraction versus inflammation 3. Lobulated uterus with multiple masses presumably fibroids. Heterogenous lower uterine segment masses also probably due to fibroids but suggest confirmation with nonemergent pelvic ultrasound  Assessment/Plan:  52 y/o AA female with a PMH of hyperthyroidism, pulmonary sarcoidosis, asthma, fibromyalgia, anxiety, and depression presented to the [facility] ED yesterday afternoon for chief complaint of 1-day history of vomiting and crampy abdominal pain and upper respiratory tract infection.   Non-intractable vomiting - ddx includes self-limited viral or bacterial gastroenteritis, peptic ulcer disease, gastritis +/- H pylori +/- NSAID-induced, duodenitis, esophagitis, pGOO, malignancy, polyp, gastroparesis, etc  Esophageal dysphagia to solids and liquids x 1-year - ddx includes esophageal dysmotility/presbyesophagus, EoE, esophagitis, esophageal stricture, esophageal web/ring, Zenker's diverticulum, infectious esophagitis, etc  Abnormal CT scan - pyloric thickening possible prominent contraction versus inflammation   Acute gastroenteritis   URI  Sinus  tachycardia  Pulmonary sarcoidosis   Daily use of NSAIDs - ibuprofen 600 mg BID  Recommendations:  - Continue supportive care with IV antiemetics, pain control, and IV fluids - Respiratory status improved overnight. Clinically, patient appears stable with no refractory vomiting or diarrhea.  - Advise EGD for luminal evaluation for indications of dysphagia and abnormal CT - Defer colonoscopy at this time. She can likely f/u with Dr. Vicente Males next week 3/20 for screening colonoscopy - Avoid NSAIDs - Follow-up results of stool studies to r/o infection - Continue management of other medical comorbidities per primary team - See EGD procedure note for findings and further recommendations - NPO  I reviewed the risks (including bleeding, perforation, infection, anesthesia complications, cardiac/respiratory complications), benefits and alternatives of EGD. Patient consents to proceed.    Thank you for the consult. Please call with questions or concerns.  Geanie Kenning, PA-C [facility] [phone]

## 2022-11-21 NOTE — Anesthesia Procedure Notes (Signed)
Date/Time: 11/21/2022 11:35 AM  Performed by: Doreen Salvage, CRNAPre-anesthesia Checklist: Patient identified, Emergency Drugs available, Suction available and Patient being monitored Patient Re-evaluated:Patient Re-evaluated prior to induction Oxygen Delivery Method: Nasal cannula Induction Type: IV induction Dental Injury: Teeth and Oropharynx as per pre-operative assessment  Comments: Nasal cannula with etCO2 monitoring

## 2022-11-22 DIAGNOSIS — K567 Ileus, unspecified: Secondary | ICD-10-CM | POA: Diagnosis not present

## 2022-11-22 MED ORDER — POTASSIUM CHLORIDE CRYS ER 20 MEQ PO TBCR
40.0000 meq | EXTENDED_RELEASE_TABLET | Freq: Once | ORAL | Status: AC
  Administered 2022-11-22: 40 meq via ORAL
  Filled 2022-11-22: qty 2

## 2022-11-22 NOTE — Plan of Care (Signed)
Stable for discharge.  Jennifer [name] Jennifer [name]

## 2022-11-22 NOTE — Progress Notes (Signed)
DISCHARGE EDUCATION COMPLETED INCLUDING ACTIVITIES, FOLLOW UP APPOINTMENTS, AND MEDICATIONS.  Faythe Dingwall Derrik Mceachern

## 2022-11-22 NOTE — Discharge Summary (Signed)
Physician Discharge Summary   Patient: Jennifer [name] MRN: [id number] DOB: 01-02-1971  Admit date:     11/20/2022  Discharge date: 11/22/22  Discharge Physician: Oran Rein   PCP: Herminio Commons, MD   Recommendations at discharge:  {Tip this will not be part of the note when signed- Example include specific recommendations for outpatient follow-up, pending tests to follow-up on. (Optional):26781}  Follow up with primary GI for colonoscopy in 2-4 weeks.  Follow with PCP in 2 weeks  Discharge Diagnoses: Principal Problem:   Ileus ([place]) Active Problems:   Acute gastroenteritis   Sinus tachycardia   Respiratory tract infection   QT prolongation   Sarcoidosis   Fibromyalgia   Abdominal pain, vomiting, and diarrhea   Graves disease   NSAID long-term use  Resolved Problems:   * No resolved hospital problems. [facility] Course:               Patient  Assessment and Plan: Acute gastroenteritis Mild ileus and pyloric thickening on CT abdomen and pelvis History of dysphagia Chronic NSAIDs Patient presented with vomiting and has a history of dysphagia x 1 year.  Takes NSAIDs for fibromyalgia -Will keep n.p.o. overnight  -GI panel and stool for C. difficile -Antiemetics, IV hydration, IV Protonix -Avoid NSAIDs -GI consult, given history of dysphagia with plans for upcoming upper and lower GI  Sinus tachycardia Suspect SIRS related to acute illness TSH and free T4 normal Continue IV hydration and monitor   Respiratory tract infection History of sarcoidosis CT chest shows :Bilateral bronchial wall thickening and scattered ground-glass airspace disease, right greater than left, consistent with inflammatory or infectious etiology Patient was started on Rocephin and doxycycline in the ED Respiratory viral panel negative Sarcoidosis flare not suspected at this time.  Patient not currently on sarcoidosis meds. -Follow procalcitonin -Will continue  antibiotics if procalcitonin positive -Antitussives, incentive spirometry, albuterol as needed   QT prolongation Avoid QT prolonging drugs  Graves disease TSH and free T4 WNL Followed by endocrinology, last seen 05/2022 when she was  weaned-10mg >5mg >2.5mg   Patient self discontinued methimazole  Fibromyalgia Continue Zanaflex.   Reports that she takes ibuprofen as needed Avoid NSAIDs  Sarcoidosis Acute flare not suspected.  Not currently on related medications   {Tip this will not be part of the note when signed Body mass index is 29.86 kg/m. , ,  (Optional):26781}  Pain control - [place] Controlled Substance Reporting System database was reviewed. and patient was instructed, not to drive, operate heavy machinery, perform activities at heights, swimming or participation in water activities or provide baby-sitting services while on Pain, Sleep and Anxiety Medications; until their outpatient Physician has advised to do so again. Also recommended to not to take more than prescribed Pain, Sleep and Anxiety Medications.  Consultants: Gastroenterology Procedures performed: EGD Disposition: Home Diet recommendation:  Discharge Diet Orders (From admission, onward)     Start     Ordered   11/22/22 0000  Diet - low sodium heart healthy        11/22/22 1052           Cardiac diet DISCHARGE MEDICATION: Allergies as of 11/22/2022       Reactions   Azithromycin Nausea And Vomiting   Also noted to have prolong qtc on EKG after    Latex Itching   Penicillin G Hives        Medication List     TAKE these medications    acetaminophen 500 MG tablet  Commonly known as: TYLENOL Take 500-1,000 mg by mouth every 6 (six) hours as needed for mild pain or fever.   Advair Diskus 250-50 MCG/ACT Aepb Generic drug: fluticasone-salmeterol Inhale 1 puff into the lungs 2 (two) times daily.   azithromycin 250 MG tablet Commonly known as: ZITHROMAX Take 250 mg by mouth daily.    bisoprolol 10 MG tablet Commonly known as: ZEBETA Take 2 tablets (20 mg total) by mouth daily.   divalproex 500 MG 24 hr tablet Commonly known as: DEPAKOTE ER Take 500 mg by mouth daily.   ibuprofen 200 MG tablet Commonly known as: ADVIL Take 400-600 mg by mouth every 6 (six) hours as needed for fever or mild pain.   meloxicam 7.5 MG tablet Commonly known as: MOBIC Take One tab PO BID with food   methimazole 10 MG tablet Commonly known as: TAPAZOLE Take 10 mg by mouth daily.   methocarbamol 500 MG tablet Commonly known as: ROBAXIN Take 500-1,000 mg by mouth 3 (three) times daily. Two to three times daily   naproxen 500 MG tablet Commonly known as: NAPROSYN Take 500 mg by mouth daily.   omeprazole 40 MG capsule Commonly known as: PRILOSEC Take 1 capsule (40 mg total) by mouth in the morning and at bedtime.   ondansetron 4 MG tablet Commonly known as: Zofran Take 1 tablet (4 mg total) by mouth every 6 (six) hours as needed for nausea or vomiting.   polyethylene glycol powder 17 GM/SCOOP powder Commonly known as: GLYCOLAX/MIRALAX Take 17 g by mouth as needed.   predniSONE 20 MG tablet Commonly known as: DELTASONE   ProAir HFA 108 (90 Base) MCG/ACT inhaler Generic drug: albuterol Inhale 1 puff into the lungs every 6 (six) hours as needed.   rosuvastatin 20 MG tablet Commonly known as: CRESTOR Take 20 mg by mouth daily.   Saphris 5 MG Subl 24 hr tablet Generic drug: asenapine Place 1 tablet (5 mg total) under the tongue daily.   tiZANidine 2 MG tablet Commonly known as: ZANAFLEX   traMADol 50 MG tablet Commonly known as: ULTRAM Take 1 tablet by mouth every 6 (six) hours as needed.   Vitamin D (Cholecalciferol) 25 MCG (1000 UT) Tabs Take 500 Units by mouth daily.        Discharge Exam: Filed Weights   11/20/22 2145  Weight: 62.6 kg   Physical Exam HENT:     Head: Normocephalic and atraumatic.  Eyes:     Pupils: Pupils are equal, round, and  reactive to light.  Cardiovascular:     Rate and Rhythm: Normal rate and regular rhythm.  Pulmonary:     Effort: Pulmonary effort is normal.  Abdominal:     General: Abdomen is flat. There is no distension.     Palpations: There is no mass.     Tenderness: There is no abdominal tenderness. There is no guarding.     Hernia: No hernia is present.  Musculoskeletal:        General: Normal range of motion.     Cervical back: Normal range of motion.  Skin:    General: Skin is warm.  Neurological:     General: No focal deficit present.     Mental Status: She is alert and oriented to person, place, and time.  Psychiatric:        Mood and Affect: Mood normal.        Behavior: Behavior normal.      Condition at discharge: good  The results of  significant diagnostics from this hospitalization (including imaging, microbiology, ancillary and laboratory) are listed below for reference.   Imaging Studies: CT Angio Chest PE W and/or Wo Contrast  Result Date: 11/20/2022 CLINICAL DATA:  Vomiting, abdominal pain, cough EXAM: CT ANGIOGRAPHY CHEST WITH CONTRAST TECHNIQUE: Multidetector CT imaging of the chest was performed using the standard protocol during bolus administration of intravenous contrast. Multiplanar CT image reconstructions and MIPs were obtained to evaluate the vascular anatomy. RADIATION DOSE REDUCTION: This exam was performed according to the departmental dose-optimization program which includes automated exposure control, adjustment of the mA and/or kV according to patient size and/or use of iterative reconstruction technique. CONTRAST:  178mL OMNIPAQUE IOHEXOL 350 MG/ML SOLN COMPARISON:  08/03/2020, 11/20/2022 FINDINGS: Cardiovascular: This is a technically adequate evaluation of the pulmonary vasculature. No filling defects or pulmonary emboli. The heart is unremarkable without pericardial effusion. No evidence of thoracic aortic aneurysm or dissection. Bovine configuration of the  aortic arch. Mediastinum/Nodes: No enlarged mediastinal, hilar, or axillary lymph nodes. Thyroid gland, trachea, and esophagus demonstrate no significant findings. Small hiatal hernia. Lungs/Pleura: Stable areas of bronchiectasis, most pronounced within the upper lobes. Continued left upper lobe cavity with internal soft tissue density, consistent with mycetoma. No change since prior exam. There are scattered ground-glass opacities bilaterally, right greater than left, with associated bronchial wall thickening. No effusion or pneumothorax. Central airways are patent. Upper Abdomen: No acute abnormality. Musculoskeletal: No acute or destructive bony lesions. Reconstructed images demonstrate no additional findings. Review of the MIP images confirms the above findings. IMPRESSION: 1. No evidence of pulmonary embolus. 2. Bilateral bronchial wall thickening and scattered ground-glass airspace disease, right greater than left, consistent with inflammatory or infectious etiology. 3. Chronic bronchiectasis, with stable left upper lobe mycetoma. 4. Small hiatal hernia. Electronically Signed   By: Randa Ngo M.D.   On: 11/20/2022 19:56   CT HEAD WO CONTRAST (5MM)  Result Date: 11/20/2022 CLINICAL DATA:  Vomiting EXAM: CT HEAD WITHOUT CONTRAST TECHNIQUE: Contiguous axial images were obtained from the base of the skull through the vertex without intravenous contrast. RADIATION DOSE REDUCTION: This exam was performed according to the departmental dose-optimization program which includes automated exposure control, adjustment of the mA and/or kV according to patient size and/or use of iterative reconstruction technique. COMPARISON:  CT 04/18/2022 FINDINGS: Brain: No evidence of acute infarction, hemorrhage, hydrocephalus, extra-axial collection or mass lesion/mass effect. Vascular: No hyperdense vessel or unexpected calcification. Skull: Normal. Negative for fracture or focal lesion. Small burr holes at the cranial  vertex. Sinuses/Orbits: Mucosal thickening and postsurgical changes in the sinuses Other: None IMPRESSION: Negative non contrasted CT appearance of the brain. Electronically Signed   By: Donavan Foil M.D.   On: 11/20/2022 19:21   CT ABDOMEN PELVIS W CONTRAST  Result Date: 11/20/2022 CLINICAL DATA:  Abdomen pain EXAM: CT ABDOMEN AND PELVIS WITH CONTRAST TECHNIQUE: Multidetector CT imaging of the abdomen and pelvis was performed using the standard protocol following bolus administration of intravenous contrast. RADIATION DOSE REDUCTION: This exam was performed according to the departmental dose-optimization program which includes automated exposure control, adjustment of the mA and/or kV according to patient size and/or use of iterative reconstruction technique. CONTRAST:  132mL OMNIPAQUE IOHEXOL 350 MG/ML SOLN COMPARISON:  PET CT 11/09/2014 FINDINGS: Lower chest: See separately dictated chest CT for lung findings. Small hiatal hernia Hepatobiliary: No focal liver abnormality is seen. No gallstones, gallbladder wall thickening, or biliary dilatation. Pancreas: Unremarkable. No pancreatic ductal dilatation or surrounding inflammatory changes. Spleen: Normal in size without  focal abnormality. Adrenals/Urinary Tract: Adrenal glands are unremarkable. Kidneys are normal, without renal calculi, focal lesion, or hydronephrosis. Bladder is unremarkable. Stomach/Bowel: The stomach is nonenlarged. Slight thickening in the region of pylorus. Fluid-filled nondilated mid to distal small bowel. No bowel wall thickening. Negative appendix. Vascular/Lymphatic: Nonaneurysmal aorta. Subcentimeter retroperitoneal lymph nodes. No suspicious adenopathy Reproductive: Lobulated uterus. Posterior lower uterine segment heterogenous masses measuring up to 3.6 cm. Additional posterior fundal masses. 2.6 cm right adnexal cyst. No imaging follow-up for this finding is recommended. Other: Negative for free air or pelvic effusion.  Musculoskeletal: No acute or significant osseous findings. IMPRESSION: 1. No CT evidence for acute intra-abdominal or pelvic abnormality. Fluid-filled nondilated mid to distal small bowel without convincing evidence for transition point/obstruction, query mild ileus. 2. Slightly thickened pyloric region which could be due to prominent contraction versus inflammation 3. Lobulated uterus with multiple masses presumably fibroids. Heterogenous lower uterine segment masses also probably due to fibroids but suggest confirmation with nonemergent pelvic ultrasound Electronically Signed   By: Donavan Foil M.D.   On: 11/20/2022 19:18   DG Chest 2 View  Result Date: 11/20/2022 CLINICAL DATA:  Cough vomiting EXAM: CHEST - 2 VIEW COMPARISON:  07/16/2021, CT 08/03/2020, chest x-ray 08/20/2020 FINDINGS: Bilateral areas of bronchiectasis most notable in left upper lobe with stable ovoid opacity corresponding to cavitary process on prior imaging, no change. Slight increased diffuse heterogeneous interstitial and hazy pulmonary opacity. Stable cardiomediastinal silhouette. No pneumothorax IMPRESSION: 1. Slight increased diffuse heterogeneous interstitial and hazy pulmonary opacity compared to prior imaging, possible acute infectious or inflammatory process to include atypical or viral infection. 2. Similar appearance of bronchiectasis and ovoid opacity in the left upper lobe corresponding to cavitary process on prior imaging. Electronically Signed   By: Donavan Foil M.D.   On: 11/20/2022 16:51    Microbiology: Results for orders placed or performed during the hospital encounter of 11/20/22  Resp panel by RT-PCR (RSV, Flu A&B, Covid) Anterior Nasal Swab     Status: None   Collection Time: 11/20/22  3:35 PM   Specimen: Anterior Nasal Swab  Result Value Ref Range Status   SARS Coronavirus 2 by RT PCR NEGATIVE NEGATIVE Final    Comment: (NOTE) SARS-CoV-2 target nucleic acids are NOT DETECTED.  The SARS-CoV-2 RNA is  generally detectable in upper respiratory specimens during the acute phase of infection. The lowest concentration of SARS-CoV-2 viral copies this assay can detect is 138 copies/mL. A negative result does not preclude SARS-Cov-2 infection and should not be used as the sole basis for treatment or other patient management decisions. A negative result may occur with  improper specimen collection/handling, submission of specimen other than nasopharyngeal swab, presence of viral mutation(s) within the areas targeted by this assay, and inadequate number of viral copies(<138 copies/mL). A negative result must be combined with clinical observations, patient history, and epidemiological information. The expected result is Negative.  Fact Sheet for Patients:  EntrepreneurPulse.com.au  Fact Sheet for Healthcare Providers:  IncredibleEmployment.be  This test is no t yet approved or cleared by the [place] FDA and  has been authorized for detection and/or diagnosis of SARS-CoV-2 by FDA under an Emergency Use Authorization (EUA). This EUA will remain  in effect (meaning this test can be used) for the duration of the COVID-19 declaration under Section 564(b)(1) of the Act, 21 U.S.C.section 360bbb-3(b)(1), unless the authorization is terminated  or revoked sooner.       Influenza A by PCR NEGATIVE NEGATIVE Final   Influenza  B by PCR NEGATIVE NEGATIVE Final    Comment: (NOTE) The Xpert Xpress SARS-CoV-2/FLU/RSV plus assay is intended as an aid in the diagnosis of influenza from Nasopharyngeal swab specimens and should not be used as a sole basis for treatment. Nasal washings and aspirates are unacceptable for Xpert Xpress SARS-CoV-2/FLU/RSV testing.  Fact Sheet for Patients: EntrepreneurPulse.com.au  Fact Sheet for Healthcare Providers: IncredibleEmployment.be  This test is not yet approved or cleared by the [place] FDA and has been authorized for detection and/or diagnosis of SARS-CoV-2 by FDA under an Emergency Use Authorization (EUA). This EUA will remain in effect (meaning this test can be used) for the duration of the COVID-19 declaration under Section 564(b)(1) of the Act, 21 U.S.C. section 360bbb-3(b)(1), unless the authorization is terminated or revoked.     Resp Syncytial Virus by PCR NEGATIVE NEGATIVE Final    Comment: (NOTE) Fact Sheet for Patients: EntrepreneurPulse.com.au  Fact Sheet for Healthcare Providers: IncredibleEmployment.be  This test is not yet approved or cleared by the [place] FDA and has been authorized for detection and/or diagnosis of SARS-CoV-2 by FDA under an Emergency Use Authorization (EUA). This EUA will remain in effect (meaning this test can be used) for the duration of the COVID-19 declaration under Section 564(b)(1) of the Act, 21 U.S.C. section 360bbb-3(b)(1), unless the authorization is terminated or revoked.  Performed at [facility], [address] [place], [place] [address]     Labs: CBC: Recent Labs  Lab 11/20/22 1535 11/21/22 0357  WBC 6.0 4.3  HGB 14.5 12.4  HCT 45.9 38.6  MCV 81.7 80.6  PLT 243 0000000   Basic Metabolic Panel: Recent Labs  Lab 11/20/22 1535 11/20/22 1620 11/21/22 0357  NA 136  --  135  K 3.8  --  3.1*  CL 102  --  106  CO2 25  --  23  GLUCOSE 142*  --  105*  BUN 18  --  9  CREATININE 0.80  --  0.64  CALCIUM 8.9  --  7.7*  MG  --  1.9  --    Liver Function Tests: Recent Labs  Lab 11/20/22 1535  AST 19  ALT 15  ALKPHOS 88  BILITOT 0.7  PROT 8.5*  ALBUMIN 4.3   CBG: No results for input(s): "GLUCAP" in the last 168 hours.  Discharge time spent: greater than 30 minutes.  Signed: Oran Rein, MD [facility] 11/22/2022

## 2022-11-23 ENCOUNTER — Encounter: Payer: Self-pay | Admitting: Gastroenterology

## 2022-11-24 LAB — SURGICAL PATHOLOGY

## 2022-11-25 ENCOUNTER — Ambulatory Visit: Payer: Medicaid Other | Admitting: Certified Registered"

## 2022-11-25 ENCOUNTER — Ambulatory Visit
Admission: RE | Admit: 2022-11-25 | Discharge: 2022-11-25 | Disposition: A | Discharge status: Home / Self Care | Payer: Medicaid Other | Attending: Gastroenterology | Admitting: Gastroenterology

## 2022-11-25 ENCOUNTER — Encounter: Payer: Self-pay | Admitting: Gastroenterology

## 2022-11-25 ENCOUNTER — Encounter
Admission: RE | Disposition: A | Discharge status: Home / Self Care | Payer: Self-pay | Source: Home / Self Care | Attending: Gastroenterology

## 2022-11-25 DIAGNOSIS — D126 Benign neoplasm of colon, unspecified: Secondary | ICD-10-CM

## 2022-11-25 DIAGNOSIS — R1319 Other dysphagia: Secondary | ICD-10-CM

## 2022-11-25 DIAGNOSIS — M797 Fibromyalgia: Secondary | ICD-10-CM | POA: Diagnosis not present

## 2022-11-25 DIAGNOSIS — F32A Depression, unspecified: Secondary | ICD-10-CM | POA: Diagnosis not present

## 2022-11-25 DIAGNOSIS — D121 Benign neoplasm of appendix: Secondary | ICD-10-CM | POA: Insufficient documentation

## 2022-11-25 DIAGNOSIS — I493 Ventricular premature depolarization: Secondary | ICD-10-CM | POA: Diagnosis not present

## 2022-11-25 DIAGNOSIS — Z1211 Encounter for screening for malignant neoplasm of colon: Secondary | ICD-10-CM | POA: Diagnosis not present

## 2022-11-25 DIAGNOSIS — E0590 Thyrotoxicosis, unspecified without thyrotoxic crisis or storm: Secondary | ICD-10-CM | POA: Diagnosis not present

## 2022-11-25 DIAGNOSIS — F419 Anxiety disorder, unspecified: Secondary | ICD-10-CM | POA: Diagnosis not present

## 2022-11-25 DIAGNOSIS — J45909 Unspecified asthma, uncomplicated: Secondary | ICD-10-CM | POA: Diagnosis not present

## 2022-11-25 DIAGNOSIS — D120 Benign neoplasm of cecum: Secondary | ICD-10-CM | POA: Insufficient documentation

## 2022-11-25 DIAGNOSIS — R0602 Shortness of breath: Secondary | ICD-10-CM | POA: Diagnosis not present

## 2022-11-25 HISTORY — PX: COLONOSCOPY WITH PROPOFOL: SHX5780

## 2022-11-25 LAB — POCT PREGNANCY, URINE: Preg Test, Ur: NEGATIVE

## 2022-11-25 SURGERY — COLONOSCOPY WITH PROPOFOL
Anesthesia: General

## 2022-11-25 MED ORDER — PROPOFOL 1000 MG/100ML IV EMUL
INTRAVENOUS | Status: AC
  Filled 2022-11-25: qty 100

## 2022-11-25 MED ORDER — SODIUM CHLORIDE 0.9 % IV SOLN
INTRAVENOUS | Status: DC
  Administered 2022-11-25: 1000 mL via INTRAVENOUS

## 2022-11-25 MED ORDER — STERILE WATER FOR IRRIGATION IR SOLN
Status: DC | PRN
  Administered 2022-11-25: 120 mL

## 2022-11-25 MED ORDER — PROPOFOL 500 MG/50ML IV EMUL
INTRAVENOUS | Status: DC | PRN
  Administered 2022-11-25: 150 ug/kg/min via INTRAVENOUS
  Administered 2022-11-25: 50 mg via INTRAVENOUS

## 2022-11-25 MED ORDER — DEXMEDETOMIDINE HCL IN NACL 80 MCG/20ML IV SOLN
INTRAVENOUS | Status: DC | PRN
  Administered 2022-11-25: 4 ug via BUCCAL

## 2022-11-25 MED ORDER — LIDOCAINE HCL (CARDIAC) PF 100 MG/5ML IV SOSY: PREFILLED_SYRINGE | INTRAVENOUS | Status: DC | PRN

## 2022-11-25 MED ORDER — DEXAMETHASONE SODIUM PHOSPHATE 10 MG/ML IJ SOLN
INTRAMUSCULAR | Status: DC | PRN
  Administered 2022-11-25: 10 mg via INTRAVENOUS

## 2022-11-25 NOTE — Anesthesia Postprocedure Evaluation (Signed)
Anesthesia Post Note  Patient: Jennifer [name]  Procedure(s) Performed: COLONOSCOPY WITH PROPOFOL  Patient location during evaluation: Endoscopy Anesthesia Type: General Level of consciousness: awake and alert Pain management: pain level controlled Vital Signs Assessment: post-procedure vital signs reviewed and stable Respiratory status: spontaneous breathing, nonlabored ventilation, respiratory function stable and patient connected to nasal cannula oxygen Cardiovascular status: blood pressure returned to baseline and stable Postop Assessment: no apparent nausea or vomiting Anesthetic complications: no   There were no known notable events for this encounter.   Last Vitals:  Vitals:   11/25/22 0912 11/25/22 0922  BP: 108/77 112/78  Pulse: 94 85  Resp: 20 17  Temp:    SpO2: 100% 100%    Last Pain:  Vitals:   11/25/22 0912  TempSrc:   PainSc: 0-No pain                 Martha Clan

## 2022-11-25 NOTE — Transfer of Care (Signed)
Immediate Anesthesia Transfer of Care Note  Patient: Jennifer [name]  Procedure(s) Performed: COLONOSCOPY WITH PROPOFOL  Patient Location: PACU  Anesthesia Type:General  Level of Consciousness: drowsy and responds to stimulation  Airway & Oxygen Therapy: Patient Spontanous Breathing  Post-op Assessment: Report given to RN and Post -op Vital signs reviewed and stable  Post vital signs: Reviewed  Last Vitals:  Vitals Value Taken Time  BP 108/74 11/25/22 0902  Temp 36.1 C 11/25/22 0902  Pulse 85 11/25/22 0903  Resp 16 11/25/22 0903  SpO2 100 % 11/25/22 0903  Vitals shown include unvalidated device data.  Last Pain:  Vitals:   11/25/22 0902  TempSrc: Temporal  PainSc: Asleep         Complications: There were no known notable events for this encounter.

## 2022-11-25 NOTE — H&P (Signed)
Jonathon Bellows, MD [address] [place], [place], [place], [address] [address], [place], [place], [place], [address] Phone: [phone]  Fax: [phone]  Primary Care Physician:  Herminio Commons, MD   Pre-Procedure History & Physical: HPI:  Jennifer [name] is a 52 y.o. female is here for an colonoscopy.   Past Medical History:  Diagnosis Date   Anxiety    Asthma    Chest pain    a. 08/2018 MV: EF 62%, no ischemia/scar.   Depression    Diastolic dysfunction    a. 09/2017 Echo: Nl LV size/fxn/wall thickness; b. 07/2020 Echo: EF 60-65%, no rwma, Gr1 DD. Nl RV size/fxn. Mild MR.   Fibromyalgia    Hyperthyroidism    Incontinence    Lymphadenopathy    Pneumonia    bilateral community acquired    PVC's (premature ventricular contractions)    a. 06/2016 24h Holter: Sinus rhythm, freq PVCs (13% burden), rare PACs; b. 03/2017 24h Holter: Predominantly sinus rhythm. Freq PVCs (6% burden); c. 08/2020 cMRI: EF 50%. Nl LV size/thickness. No LGE to suggest scar/infiltration. Nl RV size/fxn. Nl biatrial sizes. No significant valvular abnormalities.   Sarcoidosis    Sepsis ([place])    Shortness of breath dyspnea    Tachycardia     Past Surgical History:  Procedure Laterality Date   CESAREAN SECTION     1992/1994   ENDOBRONCHIAL ULTRASOUND N/A 01/27/2016   Procedure: ENDOBRONCHIAL ULTRASOUND;  Surgeon: Vilinda Boehringer, MD;  Location: [facility];  Service: Cardiopulmonary;  Laterality: N/A;   ESOPHAGOGASTRODUODENOSCOPY (EGD) WITH PROPOFOL N/A 11/21/2022   Procedure: ESOPHAGOGASTRODUODENOSCOPY (EGD) WITH PROPOFOL;  Surgeon: Annamaria Helling, DO;  Location: [place];  Service: Gastroenterology;  Laterality: N/A;   LYMPH NODE BIOPSY     VIDEO BRONCHOSCOPY N/A 01/27/2016   Procedure: VIDEO BRONCHOSCOPY WITH FLUORO;  Surgeon: Vilinda Boehringer, MD;  Location: [facility];  Service: Cardiopulmonary;  Laterality: N/A;    Prior to Admission medications   Medication Sig Start Date End  Date Taking? Authorizing Provider  acetaminophen (TYLENOL) 500 MG tablet Take 500-1,000 mg by mouth every 6 (six) hours as needed for mild pain or fever.     [provider]  ADVAIR DISKUS 250-50 MCG/DOSE AEPB Inhale 1 puff into the lungs 2 (two) times daily. 09/08/18   Flora Lipps, MD  azithromycin (ZITHROMAX) 250 MG tablet Take 250 mg by mouth daily. 11/19/22   [provider]  bisoprolol (ZEBETA) 10 MG tablet Take 2 tablets (20 mg total) by mouth daily. 08/21/21 02/17/22  End, Harrell Gave, MD  divalproex (DEPAKOTE ER) 500 MG 24 hr tablet Take 500 mg by mouth daily.     [provider]  ibuprofen (ADVIL) 200 MG tablet Take 400-600 mg by mouth every 6 (six) hours as needed for fever or mild pain.     [provider]  meloxicam (MOBIC) 7.5 MG tablet Take One tab PO BID with food 03/02/22   [provider]  methimazole (TAPAZOLE) 10 MG tablet Take 10 mg by mouth daily. 05/29/19   [provider]  methocarbamol (ROBAXIN) 500 MG tablet Take 500-1,000 mg by mouth 3 (three) times daily. Two to three times daily 10/20/22   [provider]  naproxen (NAPROSYN) 500 MG tablet Take 500 mg by mouth daily. 11/19/22   [provider]  omeprazole (PRILOSEC) 40 MG capsule Take 1 capsule (40 mg total) by mouth in the morning and at bedtime. 11/05/22 02/03/23  Jonathon Bellows, MD  ondansetron (ZOFRAN) 4 MG tablet Take 1 tablet (4 mg total) by mouth every 6 (six) hours as needed for nausea or vomiting. 08/20/20   Carrie Mew, MD  polyethylene glycol powder (GLYCOLAX/MIRALAX) 17 GM/SCOOP powder Take 17 g by mouth as needed. 08/05/20   Darliss Cheney, MD  predniSONE (DELTASONE) 20 MG tablet     [provider]  PROAIR HFA 108 (90 Base) MCG/ACT inhaler Inhale 1 puff into the lungs every 6 (six) hours as needed. 02/26/21   Flora Lipps, MD  rosuvastatin (CRESTOR) 20 MG tablet Take 20 mg by mouth daily. 06/23/22   [provider]  SAPHRIS 5  MG SUBL 24 hr tablet Place 1 tablet (5 mg total) under the tongue daily. Patient not taking: Reported on 11/20/2022 08/05/20   Darliss Cheney, MD  tiZANidine (ZANAFLEX) 2 MG tablet     [provider]  traMADol (ULTRAM) 50 MG tablet Take 1 tablet by mouth every 6 (six) hours as needed.    [provider]  Vitamin D, Cholecalciferol, 25 MCG (1000 UT) TABS Take 500 Units by mouth daily. Patient not taking: Reported on 11/20/2022 08/05/20   Darliss Cheney, MD    Allergies as of 11/05/2022 - Review Complete 11/05/2022  Allergen Reaction Noted   Penicillin g Hives 11/22/2014    Family History  Problem Relation Age of Onset   Lung cancer Mother    Diabetes Sister     Social History   Socioeconomic History   Marital status: Single    Spouse name: Not on file   Number of children: Not on file   Years of education: Not on file   Highest education level: Not on file  Occupational History   Occupation: cook  Tobacco Use   Smoking status: Never   Smokeless tobacco: Never  Vaping Use   Vaping Use: Never used  Substance and Sexual Activity   Alcohol use: No    Alcohol/week: 0.0 standard drinks of alcohol   Drug use: No   Sexual activity: Not Currently  Other Topics Concern   Not on file  Social History Narrative   Not on file   Social Determinants of Health   Financial Resource Strain: Not on file  Food Insecurity: Food Insecurity Present (11/21/2022)   Hunger Vital Sign    Worried About Running Out of Food in the Last Year: Never true    Ran Out of Food in the Last Year: Sometimes true  Transportation Needs: No Transportation Needs (11/20/2022)   PRAPARE - Hydrologist (Medical): No    Lack of Transportation (Non-Medical): No  Physical Activity: Not on file  Stress: Not on file  Social Connections: Not on file  Intimate Partner Violence: Not At Risk (11/20/2022)   Humiliation, Afraid, Rape, and Kick questionnaire    Fear of Current  or Ex-Partner: No    Emotionally Abused: No    Physically Abused: No    Sexually Abused: No    Review of Systems: See HPI, otherwise negative ROS  Physical Exam: LMP 09/30/2020  General:   Alert,  pleasant and cooperative in NAD Head:  Normocephalic and atraumatic. Neck:  Supple; no masses or thyromegaly. Lungs:  Clear throughout to auscultation, normal respiratory effort.    Heart:  +S1, +S2, Regular rate and rhythm, No edema. Abdomen:  Soft, nontender and nondistended. Normal bowel sounds, without guarding, and without rebound.   Neurologic:  Alert and  oriented x4;  grossly normal neurologically.  Impression/Plan:  Jennifer [name] is here for an colonoscopy to be performed for Screening colonoscopy average risk   Risks, benefits, limitations, and alternatives regarding  colonoscopy have been reviewed with the patient.  Questions have been answered.  All parties agreeable.   Jonathon Bellows, MD  11/25/2022, 8:14 AM

## 2022-11-25 NOTE — Anesthesia Preprocedure Evaluation (Signed)
Anesthesia Evaluation  Patient identified by MRN, date of birth, ID band Patient awake  General Assessment Comment:  Patient presented with vomiting, poor PO intolerance. NPO appropriate, denies N/V today. CT scan showing some fluid in mid-distal small intestine, but no evidence of obstruction. No fluid in stomach.  Reviewed: Allergy & Precautions, NPO status , Patient's Chart, lab work & pertinent test results  History of Anesthesia Complications Negative for: history of anesthetic complications  Airway Mallampati: II       Dental  (+) Teeth Intact, Dental Advidsory Given   Pulmonary shortness of breath and with exertion, asthma , neg sleep apnea, neg COPD, Recent URI , Residual Cough, Patient abstained from smoking.Not current smoker Patient with active URI, with cough and congestion.   breath sounds clear to auscultation + decreased breath sounds      Cardiovascular Exercise Tolerance: Good METShypertension, Pt. on home beta blockers (-) CAD and (-) Past MI (-) dysrhythmias  Rhythm:Regular Rate:Normal     Neuro/Psych  PSYCHIATRIC DISORDERS Anxiety Depression    negative neurological ROS     GI/Hepatic Neg liver ROS,neg GERD  ,,  Endo/Other  neg diabetes Hyperthyroidism   Renal/GU negative Renal ROS     Musculoskeletal  (+)  Fibromyalgia -  Abdominal Normal abdominal exam  (+)   Peds  Hematology negative hematology ROS (+)   Anesthesia Other Findings Past Medical History: No date: Anxiety No date: Asthma No date: Chest pain     Comment:  a. 08/2018 MV: EF 62%, no ischemia/scar. No date: Depression No date: Diastolic dysfunction     Comment:  a. 09/2017 Echo: Nl LV size/fxn/wall thickness; b.               07/2020 Echo: EF 60-65%, no rwma, Gr1 DD. Nl RV size/fxn.              Mild MR. No date: Fibromyalgia No date: Hyperthyroidism No date: Incontinence No date: Lymphadenopathy No date: Pneumonia      Comment:  bilateral community acquired  No date: PVC's (premature ventricular contractions)     Comment:  a. 06/2016 24h Holter: Sinus rhythm, freq PVCs (13%               burden), rare PACs; b. 03/2017 24h Holter: Predominantly               sinus rhythm. Freq PVCs (6% burden); c. 08/2020 cMRI: EF               50%. Nl LV size/thickness. No LGE to suggest               scar/infiltration. Nl RV size/fxn. Nl biatrial sizes. No               significant valvular abnormalities. No date: Sarcoidosis No date: Sepsis ([place]) No date: Shortness of breath dyspnea No date: Tachycardia  Reproductive/Obstetrics                             Anesthesia Physical Anesthesia Plan  ASA: 3 and emergent  Anesthesia Plan: General   Post-op Pain Management: Minimal or no pain anticipated   Induction: Intravenous  PONV Risk Score and Plan: 3 and Propofol infusion, TIVA and Ondansetron  Airway Management Planned: Natural Airway  Additional Equipment: None  Intra-op Plan:   Post-operative Plan:   Informed Consent: I have reviewed the patients History and Physical, chart, labs and discussed the procedure including the  risks, benefits and alternatives for the proposed anesthesia with the patient or authorized representative who has indicated his/her understanding and acceptance.     Dental advisory given  Plan Discussed with: CRNA  Anesthesia Plan Comments: (Given no evidence of obstruction on CT scan, and patient denying nausea or vomiting today, I believe it is reasonable to proceed with natural airway, especially given patient's concurrent URI symptoms, which would be exacerbated with endotracheal intubation. Dr Virgina Jock GI believes this case unable to wait weeks for resolution of URI symptoms due to patient's inability to take PO.  Discussed risks of anesthesia with patient, including possibility of difficulty with spontaneous ventilation under anesthesia necessitating airway  intervention, PONV, aspiration, and rare risks such as cardiac or respiratory or neurological events, and allergic reactions. Discussed the role of CRNA in patient's perioperative care. Patient understands.)        Anesthesia Quick Evaluation

## 2022-11-25 NOTE — Op Note (Signed)
[facility] Patient Name: Jennifer [name] Procedure Date: 11/25/2022 8:10 AM MRN: [id number] Account #: [id number] Date of Birth: 1970-11-10 Admit Type: Outpatient Age: 52 Room: [facility] ENDO ROOM 3 Gender: Female Note Status: Finalized Instrument Name: Jasper Riling [id number] Procedure:             Colonoscopy Indications:           Screening for colorectal malignant neoplasm Providers:             Jonathon Bellows MD, MD Referring MD:          Tania Ade (Referring MD) Medicines:             Monitored Anesthesia Care Complications:         No immediate complications. Procedure:             Pre-Anesthesia Assessment:                        - Prior to the procedure, a History and Physical was                         performed, and patient medications, allergies and                         sensitivities were reviewed. The patient's tolerance                         of previous anesthesia was reviewed.                        - The risks and benefits of the procedure and the                         sedation options and risks were discussed with the                         patient. All questions were answered and informed                         consent was obtained.                        - ASA Grade Assessment: II - A patient with mild                         systemic disease.                        After obtaining informed consent, the colonoscope was                         passed under direct vision. Throughout the procedure,                         the patient's blood pressure, pulse, and oxygen                         saturations were monitored continuously. The                         Colonoscope was introduced  through the anus and                         advanced to the the cecum, identified by the                         appendiceal orifice. The colonoscopy was performed                         with ease. The patient tolerated the procedure well.                          The quality of the bowel preparation was excellent.                         The ileocecal valve, appendiceal orifice, and rectum                         were photographed. Findings:      The perianal and digital rectal examinations were normal.      A 3 mm polyp was found in the appendiceal orifice. The polyp was       sessile. The polyp was removed with a jumbo cold forceps. Resection and       retrieval were complete.      A 5 mm polyp was found in the cecum. The polyp was sessile. The polyp       was removed with a cold snare. Resection and retrieval were complete.      The exam was otherwise without abnormality on direct and retroflexion       views. Impression:            - One 3 mm polyp at the appendiceal orifice, removed                         with a jumbo cold forceps. Resected and retrieved.                        - One 5 mm polyp in the cecum, removed with a cold                         snare. Resected and retrieved.                        - The examination was otherwise normal on direct and                         retroflexion views. Recommendation:        - Discharge patient to home (with escort).                        - Resume previous diet.                        - Continue present medications.                        - Await pathology results.                        -  Repeat colonoscopy for surveillance and for                         surveillance based on pathology results. Procedure Code(s):     --- Professional ---                        [phone], Colonoscopy, flexible; with removal of                         tumor(s), polyp(s), or other lesion(s) by snare                         technique                        45380, 93, Colonoscopy, flexible; with biopsy, single                         or multiple Diagnosis Code(s):     --- Professional ---                        D12.1, Benign neoplasm of appendix                        Z12.11, Encounter for  screening for malignant neoplasm                         of colon                        D12.0, Benign neoplasm of cecum CPT copyright 2022 American Medical Association. All rights reserved. The codes documented in this report are preliminary and upon coder review may  be revised to meet current compliance requirements. Jonathon Bellows, MD Jonathon Bellows MD, MD 11/25/2022 9:03:42 AM This report has been signed electronically. Number of Addenda: 0 Note Initiated On: 11/25/2022 8:10 AM Scope Withdrawal Time: 0 hours 10 minutes 51 seconds  Total Procedure Duration: 0 hours 13 minutes 31 seconds  Estimated Blood Loss:  Estimated blood loss: none.      [facility]

## 2022-11-26 ENCOUNTER — Encounter: Payer: Self-pay | Admitting: Gastroenterology

## 2022-11-26 LAB — SURGICAL PATHOLOGY

## 2023-02-04 ENCOUNTER — Encounter: Payer: Self-pay | Admitting: Gastroenterology

## 2023-02-04 ENCOUNTER — Ambulatory Visit (INDEPENDENT_AMBULATORY_CARE_PROVIDER_SITE_OTHER): Payer: 59 | Admitting: Gastroenterology

## 2023-02-04 ENCOUNTER — Telehealth: Payer: Self-pay

## 2023-02-04 VITALS — BP 115/68 | HR 73 | Temp 97.9°F | Wt 140.0 lb

## 2023-02-04 DIAGNOSIS — R1319 Other dysphagia: Secondary | ICD-10-CM

## 2023-02-04 NOTE — Progress Notes (Signed)
Wyline Mood MD, MRCP(U.K) [address]  [address]  [place], [place] [address]  Main: [phone]  Fax: [phone]   Primary Care Physician: Judeen Hammans, MD  Primary Gastroenterologist:  Dr. Wyline Mood   Chief Complaint  Patient presents with   Dysphagia    HPI: Jennifer [name] is a 52 y.o. female  Summary of history :  Initially referred and seen in 10/2022 for dysphagia. She had difficulty swallowing for about a year affecting solids and liquids. Get stuck at the neck. Denies any weight loss. Denies any heartburn. She is on Prilosec but was unsure of the dose and has been taking it after her meals.   Interval history 11/05/2022-02/04/2023  11/2022: presented to ER with vomiting after a course of azithromycin.  11/20/2022: Ct chest abdomen and pelvis : ? Mild ileus, thickened pyloric region  11/21/2022: EGD: hiatal hernia, empiric dilation performed to 62 F, bx no evidence of EOE, gastric bx showed inactive gastritis   3/202/2024: colonoscopy showed 2 sessile polyps resected. Tubular adenoma   Doing well since last visit no complaints of dysphagia.  Taking her PPI.   Current Outpatient Medications  Medication Sig Dispense Refill   acetaminophen (TYLENOL) 500 MG tablet Take 500-1,000 mg by mouth every 6 (six) hours as needed for mild pain or fever.      ADVAIR DISKUS 250-50 MCG/DOSE AEPB Inhale 1 puff into the lungs 2 (two) times daily. 60 each 5   azithromycin (ZITHROMAX) 250 MG tablet Take 250 mg by mouth daily.     divalproex (DEPAKOTE ER) 500 MG 24 hr tablet Take 500 mg by mouth daily.      ibuprofen (ADVIL) 200 MG tablet Take 400-600 mg by mouth every 6 (six) hours as needed for fever or mild pain.      meloxicam (MOBIC) 7.5 MG tablet Take One tab PO BID with food     methimazole (TAPAZOLE) 10 MG tablet Take 10 mg by mouth daily.     methocarbamol (ROBAXIN) 500 MG tablet Take 500-1,000 mg by mouth 3 (three) times daily. Two to three times daily      naproxen (NAPROSYN) 500 MG tablet Take 500 mg by mouth daily.     omeprazole (PRILOSEC) 40 MG capsule Take 1 capsule (40 mg total) by mouth in the morning and at bedtime. 180 capsule 0   ondansetron (ZOFRAN) 4 MG tablet Take 1 tablet (4 mg total) by mouth every 6 (six) hours as needed for nausea or vomiting. 20 tablet 1   polyethylene glycol powder (GLYCOLAX/MIRALAX) 17 GM/SCOOP powder Take 17 g by mouth as needed. 255 g 0   predniSONE (DELTASONE) 20 MG tablet      PROAIR HFA 108 (90 Base) MCG/ACT inhaler Inhale 1 puff into the lungs every 6 (six) hours as needed. 18 g 5   rosuvastatin (CRESTOR) 20 MG tablet Take 20 mg by mouth daily.     SAPHRIS 5 MG SUBL 24 hr tablet Place 1 tablet (5 mg total) under the tongue daily. 60 tablet 0   tiZANidine (ZANAFLEX) 2 MG tablet      traMADol (ULTRAM) 50 MG tablet Take 1 tablet by mouth every 6 (six) hours as needed.     Vitamin D, Cholecalciferol, 25 MCG (1000 UT) TABS Take 500 Units by mouth daily. 60 tablet    bisoprolol (ZEBETA) 10 MG tablet Take 2 tablets (20 mg total) by mouth daily. 60 tablet 5   No current facility-administered medications for this  visit.    Allergies as of 02/04/2023 - Review Complete 02/04/2023  Allergen Reaction Noted   Azithromycin Nausea And Vomiting 11/20/2022   Latex Itching 11/20/2022   Penicillin g Hives 11/22/2014   ROS:  General: Negative for anorexia, weight loss, fever, chills, fatigue, weakness. ENT: Negative for hoarseness, difficulty swallowing , nasal congestion. CV: Negative for chest pain, angina, palpitations, dyspnea on exertion, peripheral edema.  Respiratory: Negative for dyspnea at rest, dyspnea on exertion, cough, sputum, wheezing.  GI: See history of present illness. GU:  Negative for dysuria, hematuria, urinary incontinence, urinary frequency, nocturnal urination.  Endo: Negative for unusual weight change.    Physical Examination:   BP 115/68   Pulse 73   Temp 97.9 F (36.6 C) (Oral)   Wt  140 lb (63.5 kg)   LMP 09/30/2020   BMI 30.30 kg/m   General: Well-nourished, well-developed in no acute distress.  Eyes: No icterus. Conjunctivae pink. Neuro: Alert and oriented x 3.  Grossly intact. Skin: Warm and dry, no jaundice.   Psych: Alert and cooperative, normal mood and affect.   Imaging Studies: No results found.  Assessment and Plan:   Jennifer [name] is a 52 y.o. y/o female here to follow up for dysphagia doing well since her endoscopy and empiric dilation.  No symptoms of reflux.  Taking a PPI daily.  I advised her to stop taking the PPI in a few weeks when she runs out of her medication and then she can watch and see how her symptoms evolve.  If she has no symptoms can keep off the PPI but if she has symptoms could resume and follow-up with me as an outpatient    Dr Wyline Mood  MD,MRCP [facility] [place]) Follow up in as needed

## 2023-02-04 NOTE — Telephone Encounter (Signed)
Received a medical records request from [facility] health with sign medical release form. They are requesting all medical records for the last 2 years. Faxed office visit , colonoscopy report and path report. Faxed to [phone]. Got confirmation fax went through

## 2023-02-25 ENCOUNTER — Telehealth: Payer: Self-pay

## 2023-02-25 DIAGNOSIS — R062 Wheezing: Secondary | ICD-10-CM

## 2023-02-25 DIAGNOSIS — D869 Sarcoidosis, unspecified: Secondary | ICD-10-CM

## 2023-02-25 MED ORDER — FLUTICASONE-SALMETEROL 250-50 MCG/ACT IN AEPB: 1.0000 | INHALATION_SPRAY | Freq: Two times a day (BID) | RESPIRATORY_TRACT | 1 refills | Status: DC

## 2023-02-25 NOTE — Telephone Encounter (Signed)
We received a fax from [facility] for a refill on her Fluticasone/Salm (Advair) inhaler. The patient has not been seen in our office since 02/26/2021. I spoke with the patient and scheduled her an appt with Dr. Belia Heman on 8/15 at 11:15am. I told her I would send in a refill of her medication to have until her appt. Refill has been sent in to her pharmacy.  Nothing further needed.

## 2023-04-22 ENCOUNTER — Encounter: Payer: Self-pay | Admitting: Internal Medicine

## 2023-04-22 ENCOUNTER — Ambulatory Visit (INDEPENDENT_AMBULATORY_CARE_PROVIDER_SITE_OTHER): Payer: 59 | Admitting: Internal Medicine

## 2023-04-22 VITALS — BP 126/72 | HR 74 | Temp 98.0°F | Ht <= 58 in | Wt 136.8 lb

## 2023-04-22 DIAGNOSIS — R0602 Shortness of breath: Secondary | ICD-10-CM

## 2023-04-22 DIAGNOSIS — R9389 Abnormal findings on diagnostic imaging of other specified body structures: Secondary | ICD-10-CM

## 2023-04-22 NOTE — Patient Instructions (Signed)
Please start Advair inhaler inhaler as prescribed Rinse mouth after every use  Continue to use albuterol as needed  We will check oxygen levels when walking We will check oxygen levels when sleeping  Avoid secondhand smoke Avoid SICK contacts Recommend  Masking  when appropriate Recommend Keep up-to-date with vaccinations

## 2023-04-22 NOTE — Addendum Note (Signed)
Addended by: Bonney Leitz on: 04/22/2023 11:26 AM   Modules accepted: Orders

## 2023-04-22 NOTE — Progress Notes (Signed)
[facility] Pulmonary Medicine Consultation      MRN# [id number] Jennifer [name] [birth year] Brief History: Synopsis: 52 year old female with left upper lobe cavitary lesion, history of bilateral pneumonia, currently being followed by pulmonary and infectious disease. Cavitary lesion differential at this time includes fungal infection versus sarcoidosis. 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  Diagnosis of aspergilloma Previous 6-minute walk and ONO within normal limits  Has intermittent fatigue at times She does not want to taper steroids at this time Robitussin with codeine helps her cough  CT chest August 03, 2020  Independently reviewed and also reviewed with patient Chronic bilateral cylindrical bronchiectasis Persistent cavitary lesion in the left upper lobe containing intramural soft tissue mass. The area of cavitation is similar to 2017 study with slight enlargement of the intramural soft tissue mass.   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   Patient also has extreme back pain which is unrelated to her lung disease PET scan low level activity  CT chest March 2024 No significant change in the left upper lobe cavitary lesion  CC: Follow-up shortness of breath Follow up Abnormal CT chest    HPI  No exacerbation at this time No evidence of heart failure at this time No evidence or signs of infection at this time No respiratory distress No fevers, chills, nausea, vomiting, diarrhea No evidence of lower extremity edema No evidence hemoptysis  Patient is having increased shortness of breath Increased dyspnea on exertion Has a dry cough No wheezing No fevers  Patient has refills of Advair that she has not picked up yet I recommend restarting Advair Rinse mouth after every use  CT of the chest reviewed in  detail with patient  Medication:    Current Outpatient Medications:    acetaminophen (TYLENOL) 500 MG tablet, Take 500-1,000 mg by mouth every 6 (six) hours as needed for mild pain or fever. , Disp: , Rfl:    divalproex (DEPAKOTE ER) 500 MG 24 hr tablet, Take 500 mg by mouth daily. , Disp: , Rfl:    fluticasone-salmeterol (ADVAIR DISKUS) 250-50 MCG/ACT AEPB, Inhale 1 puff into the lungs 2 (two) times daily., Disp: 60 each, Rfl: 1   ibuprofen (ADVIL) 200 MG tablet, Take 400-600 mg by mouth every 6 (six) hours as needed for fever or mild pain. , Disp: , Rfl:    meloxicam (MOBIC) 7.5 MG tablet, Take One tab PO BID with food, Disp: , Rfl:    methimazole (TAPAZOLE) 10 MG tablet, Take 10 mg by mouth daily., Disp: , Rfl:    methocarbamol (ROBAXIN) 500 MG tablet, Take 500-1,000 mg by mouth 3 (three) times daily. Two to three times daily, Disp: , Rfl:    naproxen (NAPROSYN) 500 MG tablet, Take 500 mg by mouth daily., Disp: , Rfl:    ondansetron (ZOFRAN) 4 MG tablet, Take 1 tablet (4 mg total) by mouth every 6 (six) hours as needed for nausea or vomiting., Disp: 20 tablet, Rfl: 1   polyethylene glycol powder (GLYCOLAX/MIRALAX) 17 GM/SCOOP powder, Take 17 g by mouth as needed., Disp: 255 g, Rfl: 0   PROAIR HFA 108 (90 Base) MCG/ACT inhaler, Inhale 1 puff into the lungs every 6 (six) hours as needed., Disp: 18 g, Rfl: 5   rosuvastatin (CRESTOR) 20 MG tablet, Take 20 mg by mouth daily., Disp: , Rfl:    SAPHRIS 5 MG SUBL 24 hr tablet,  Place 1 tablet (5 mg total) under the tongue daily., Disp: 60 tablet, Rfl: 0   tiZANidine (ZANAFLEX) 2 MG tablet, , Disp: , Rfl:    Vitamin D, Cholecalciferol, 25 MCG (1000 UT) TABS, Take 500 Units by mouth daily., Disp: 60 tablet, Rfl:    azithromycin (ZITHROMAX) 250 MG tablet, Take 250 mg by mouth daily. (Patient not taking: Reported on 04/22/2023), Disp: , Rfl:    bisoprolol (ZEBETA) 10 MG tablet, Take 2 tablets (20 mg total) by mouth daily., Disp: 60 tablet, Rfl: 5    omeprazole (PRILOSEC) 40 MG capsule, Take 1 capsule (40 mg total) by mouth in the morning and at bedtime., Disp: 180 capsule, Rfl: 0   predniSONE (DELTASONE) 20 MG tablet, , Disp: , Rfl:    traMADol (ULTRAM) 50 MG tablet, Take 1 tablet by mouth every 6 (six) hours as needed. (Patient not taking: Reported on 04/22/2023), Disp: , Rfl:    BP 126/72 (BP Location: Left Arm, Cuff Size: Normal)   Pulse 74   Temp 98 F (36.7 C) (Temporal)   Ht 4\' 9"  (1.448 m)   Wt 136 lb 12.8 oz (62.1 kg)   LMP 09/30/2020   SpO2 98%   BMI 29.60 kg/m       Review of Systems: Gen:  Denies  fever, sweats, chills weight loss  HEENT: Denies blurred vision, double vision, ear pain, eye pain, hearing loss, nose bleeds, sore throat Cardiac:  No dizziness, chest pain or heaviness, chest tightness,edema, No JVD Resp:   No cough, -sputum production, -shortness of breath,-wheezing, -hemoptysis,  Other:  All other systems negative   Physical Examination:   General Appearance: No distress  EYES PERRLA, EOM intact.   NECK Supple, No JVD Pulmonary: normal breath sounds, No wheezing.  CardiovascularNormal S1,S2.  No m/r/g.   Abdomen: Benign, Soft, non-tender. 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 presenting with follow-up visit with history of aspergilloma with signs and symptoms of increasing shortness of breath and dyspnea on exertion   Shortness of breath and dyspnea exertion  Obtain 6-minute walk test to assess for exertional hypoxia  Obtain overnight pulse oximetry to assess for nocturnal  hypoxia   Recommend restarting Advair   Previous history of aspergilloma Previously had voriconazole therapy early in 2016 but developed a fungus ball on CT chest ID has taken her off voriconazole   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 With left upper lobe pulmonary cavitary lesion with soft intramural tissue mass Since there are no significant CT scan changes over the last couple of years there is no indication for PET scan at this time we will follow along very closely   MEDICATION ADJUSTMENTS/LABS AND TESTS ORDERED: Please start Advair inhaler inhaler as prescribed Rinse mouth after every use Continue to use albuterol as needed We will check oxygen levels when walking We will check oxygen levels when sleeping Avoid secondhand smoke Avoid SICK contacts Recommend  Masking  when appropriate Recommend Keep up-to-date with vaccinations  CURRENT MEDICATIONS REVIEWED AT LENGTH WITH PATIENT TODAY   Follow-up 6 months  Total time spent 32 mins  [name], M.D.  [facility]  Medical Director  [facility] Medical Director [facility] Cardio-Pulmonary Department

## 2023-06-08 ENCOUNTER — Telehealth: Payer: Self-pay | Admitting: Internal Medicine

## 2023-06-08 NOTE — Telephone Encounter (Signed)
I sent an urgent message to [employer] asking them to check on this issue

## 2023-06-08 NOTE — Telephone Encounter (Signed)
Patient is calling because she was supposed to receive a CPAP machine in the mail. She isn't sure who here [facility] provider is but states that she should have received it about three weeks ago. Please call and advise.

## 2023-06-08 NOTE — Telephone Encounter (Signed)
There is not an order been placed will send to [facility] since its there patient

## 2023-06-08 NOTE — Telephone Encounter (Signed)
I spoke with the patient. She said someone reached out to her about the ONO. They were suppose to have mailed it to her a few weeks ago and she never received it. And she does not know what company reached out to her.  Synetta Fail, can you check on this? Thank you!

## 2023-06-09 NOTE — Telephone Encounter (Signed)
Jennifer [name] with [employer] stated that it looks like the ONO order was voided. She would get new order created and send over as urgent

## 2023-06-10 NOTE — Telephone Encounter (Signed)
Lm x1 for the patient.

## 2023-06-11 NOTE — Telephone Encounter (Signed)
Patient is aware of below message and voiced her understanding.  Nothing further needed.   

## 2023-07-06 ENCOUNTER — Telehealth: Payer: Self-pay | Admitting: Internal Medicine

## 2023-07-06 DIAGNOSIS — D869 Sarcoidosis, unspecified: Secondary | ICD-10-CM

## 2023-07-06 DIAGNOSIS — R0602 Shortness of breath: Secondary | ICD-10-CM

## 2023-07-06 NOTE — Telephone Encounter (Signed)
Pt wants to track her CPAP order

## 2023-07-06 NOTE — Telephone Encounter (Signed)
See telephone encounter from 06/08/2023. Looks like the first ONO order was voided.  I have placed another order to [employer] for an ONO. I have notified the patient.  Nothing further needed.

## 2023-07-26 ENCOUNTER — Telehealth: Payer: Self-pay

## 2023-07-26 DIAGNOSIS — D869 Sarcoidosis, unspecified: Secondary | ICD-10-CM

## 2023-07-26 NOTE — Telephone Encounter (Signed)
ONO reviewed by Dr. Belia Heman-  recommend 1L at bedtime.   Spoke to patient and relayed above results. She voiced her understanding and agreed with plan.  Order has been placed to [employer]. Nothing further needed.

## 2023-07-27 ENCOUNTER — Other Ambulatory Visit: Payer: Self-pay | Admitting: Nurse Practitioner

## 2023-07-27 DIAGNOSIS — Z1231 Encounter for screening mammogram for malignant neoplasm of breast: Secondary | ICD-10-CM

## 2023-08-03 ENCOUNTER — Ambulatory Visit
Admission: RE | Admit: 2023-08-03 | Discharge: 2023-08-03 | Disposition: A | Discharge status: Home / Self Care | Payer: 59 | Source: Ambulatory Visit | Attending: Nurse Practitioner | Admitting: Nurse Practitioner

## 2023-08-03 DIAGNOSIS — Z1231 Encounter for screening mammogram for malignant neoplasm of breast: Secondary | ICD-10-CM | POA: Insufficient documentation

## 2023-10-01 ENCOUNTER — Ambulatory Visit (LOCAL_COMMUNITY_HEALTH_CENTER): Payer: Self-pay

## 2023-10-01 DIAGNOSIS — Z111 Encounter for screening for respiratory tuberculosis: Secondary | ICD-10-CM

## 2023-10-04 ENCOUNTER — Ambulatory Visit (LOCAL_COMMUNITY_HEALTH_CENTER): Payer: 59

## 2023-10-04 DIAGNOSIS — Z111 Encounter for screening for respiratory tuberculosis: Secondary | ICD-10-CM

## 2023-10-04 LAB — TB SKIN TEST
Induration: 0 mm
TB Skin Test: NEGATIVE

## 2023-10-29 ENCOUNTER — Inpatient Hospital Stay
Admission: EM | Admit: 2023-10-29 | Discharge: 2023-11-02 | DRG: 197 | Disposition: A | Discharge status: Home / Self Care | Payer: MEDICAID | Attending: Internal Medicine | Admitting: Internal Medicine

## 2023-10-29 ENCOUNTER — Emergency Department: Payer: MEDICAID

## 2023-10-29 ENCOUNTER — Other Ambulatory Visit: Payer: Self-pay

## 2023-10-29 DIAGNOSIS — F32A Depression, unspecified: Secondary | ICD-10-CM | POA: Diagnosis present

## 2023-10-29 DIAGNOSIS — Z9981 Dependence on supplemental oxygen: Secondary | ICD-10-CM

## 2023-10-29 DIAGNOSIS — E785 Hyperlipidemia, unspecified: Secondary | ICD-10-CM | POA: Diagnosis present

## 2023-10-29 DIAGNOSIS — M797 Fibromyalgia: Secondary | ICD-10-CM | POA: Diagnosis present

## 2023-10-29 DIAGNOSIS — Z7951 Long term (current) use of inhaled steroids: Secondary | ICD-10-CM

## 2023-10-29 DIAGNOSIS — Z79899 Other long term (current) drug therapy: Secondary | ICD-10-CM

## 2023-10-29 DIAGNOSIS — J479 Bronchiectasis, uncomplicated: Secondary | ICD-10-CM | POA: Diagnosis present

## 2023-10-29 DIAGNOSIS — Z91040 Latex allergy status: Secondary | ICD-10-CM

## 2023-10-29 DIAGNOSIS — K219 Gastro-esophageal reflux disease without esophagitis: Secondary | ICD-10-CM | POA: Diagnosis present

## 2023-10-29 DIAGNOSIS — Z880 Allergy status to penicillin: Secondary | ICD-10-CM

## 2023-10-29 DIAGNOSIS — F419 Anxiety disorder, unspecified: Secondary | ICD-10-CM | POA: Diagnosis present

## 2023-10-29 DIAGNOSIS — J441 Chronic obstructive pulmonary disease with (acute) exacerbation: Secondary | ICD-10-CM

## 2023-10-29 DIAGNOSIS — R0602 Shortness of breath: Principal | ICD-10-CM

## 2023-10-29 DIAGNOSIS — E871 Hypo-osmolality and hyponatremia: Secondary | ICD-10-CM | POA: Diagnosis present

## 2023-10-29 DIAGNOSIS — Z833 Family history of diabetes mellitus: Secondary | ICD-10-CM

## 2023-10-29 DIAGNOSIS — Z801 Family history of malignant neoplasm of trachea, bronchus and lung: Secondary | ICD-10-CM

## 2023-10-29 DIAGNOSIS — J45901 Unspecified asthma with (acute) exacerbation: Principal | ICD-10-CM | POA: Diagnosis present

## 2023-10-29 DIAGNOSIS — I10 Essential (primary) hypertension: Secondary | ICD-10-CM | POA: Diagnosis present

## 2023-10-29 DIAGNOSIS — E0590 Thyrotoxicosis, unspecified without thyrotoxic crisis or storm: Secondary | ICD-10-CM | POA: Diagnosis present

## 2023-10-29 DIAGNOSIS — D860 Sarcoidosis of lung: Principal | ICD-10-CM | POA: Diagnosis present

## 2023-10-29 DIAGNOSIS — Z881 Allergy status to other antibiotic agents status: Secondary | ICD-10-CM

## 2023-10-29 DIAGNOSIS — Z1152 Encounter for screening for COVID-19: Secondary | ICD-10-CM

## 2023-10-29 LAB — COMPREHENSIVE METABOLIC PANEL
ALT: 19 U/L (ref 0–44)
AST: 19 U/L (ref 15–41)
Albumin: 4.2 g/dL (ref 3.5–5.0)
Alkaline Phosphatase: 102 U/L (ref 38–126)
Anion gap: 14 (ref 5–15)
BUN: 11 mg/dL (ref 6–20)
CO2: 22 mmol/L (ref 22–32)
Calcium: 9.5 mg/dL (ref 8.9–10.3)
Chloride: 98 mmol/L (ref 98–111)
Creatinine, Ser: 0.82 mg/dL (ref 0.44–1.00)
GFR, Estimated: >60 mL/min (ref >60)
Glucose, Bld: 139 mg/dL — ABNORMAL HIGH (ref 70–99)
Potassium: 3.7 mmol/L (ref 3.5–5.1)
Sodium: 134 mmol/L — ABNORMAL LOW (ref 135–145)
Total Bilirubin: 0.9 mg/dL (ref 0.0–1.2)
Total Protein: 7.8 g/dL (ref 6.5–8.1)

## 2023-10-29 LAB — CBC
HCT: 39.1 % (ref 36.0–46.0)
Hemoglobin: 12.7 g/dL (ref 12.0–15.0)
MCH: 25.5 pg — ABNORMAL LOW (ref 26.0–34.0)
MCHC: 32.5 g/dL (ref 30.0–36.0)
MCV: 78.5 fL — ABNORMAL LOW (ref 80.0–100.0)
Platelets: 221 10*3/uL (ref 150–400)
RBC: 4.98 MIL/uL (ref 3.87–5.11)
RDW: 13.3 % (ref 11.5–15.5)
WBC: 7.9 10*3/uL (ref 4.0–10.5)
nRBC: 0 % (ref 0.0–0.2)

## 2023-10-29 LAB — RESP PANEL BY RT-PCR (RSV, FLU A&B, COVID)  RVPGX2
Influenza A by PCR: NEGATIVE
Influenza B by PCR: NEGATIVE
Resp Syncytial Virus by PCR: NEGATIVE
SARS Coronavirus 2 by RT PCR: NEGATIVE

## 2023-10-29 MED ORDER — METHYLPREDNISOLONE SODIUM SUCC 125 MG IJ SOLR
125.0000 mg | Freq: Once | INTRAMUSCULAR | Status: AC
  Administered 2023-10-29: 125 mg via INTRAVENOUS
  Filled 2023-10-29: qty 2

## 2023-10-29 MED ORDER — SODIUM CHLORIDE 0.9 % IV BOLUS
1000.0000 mL | Freq: Once | INTRAVENOUS | Status: AC
  Administered 2023-10-29: 1000 mL via INTRAVENOUS

## 2023-10-29 MED ORDER — IPRATROPIUM-ALBUTEROL 0.5-2.5 (3) MG/3ML IN SOLN
3.0000 mL | Freq: Once | RESPIRATORY_TRACT | Status: AC
  Administered 2023-10-29: 3 mL via RESPIRATORY_TRACT
  Filled 2023-10-29: qty 3

## 2023-10-29 MED ORDER — HYDROCOD POLI-CHLORPHE POLI ER 10-8 MG/5ML PO SUER
5.0000 mL | Freq: Once | ORAL | Status: AC
  Administered 2023-10-29: 5 mL via ORAL
  Filled 2023-10-29: qty 5

## 2023-10-29 NOTE — ED Provider Notes (Signed)
 [facility] Provider Note    Event Date/Time   First MD Initiated Contact with Patient 10/29/23 2306     (approximate)   History   Shortness of breath   HPI  Jennifer [name] is a 53 y.o. female who presents to the ED from home with a chief complaint of shortness of breath.  Patient with a history of COPD who uses 2 L nasal cannula oxygen at night.  Reports cough and congestion for the past 2 weeks.  Denies fever/chills, chest pain, abdominal pain, nausea, vomiting or dizziness.     Past Medical History   Past Medical History:  Diagnosis Date   Anxiety    Asthma    Chest pain    a. 08/2018 MV: EF 62%, no ischemia/scar.   Depression    Diastolic dysfunction    a. 09/2017 Echo: Nl LV size/fxn/wall thickness; b. 07/2020 Echo: EF 60-65%, no rwma, Gr1 DD. Nl RV size/fxn. Mild MR.   Fibromyalgia    Hyperthyroidism    Incontinence    Lymphadenopathy    Pneumonia    bilateral community acquired    PVC's (premature ventricular contractions)    a. 06/2016 24h Holter: Sinus rhythm, freq PVCs (13% burden), rare PACs; b. 03/2017 24h Holter: Predominantly sinus rhythm. Freq PVCs (6% burden); c. 08/2020 cMRI: EF 50%. Nl LV size/thickness. No LGE to suggest scar/infiltration. Nl RV size/fxn. Nl biatrial sizes. No significant valvular abnormalities.   Sarcoidosis    Sepsis (HCC)    Shortness of breath dyspnea    Tachycardia      Active Problem List   Patient Active Problem List   Diagnosis Date Noted   Adenomatous polyp of colon 11/25/2022   Ileus (HCC) 11/20/2022   Abdominal pain, vomiting, and diarrhea 11/20/2022   Acute gastroenteritis 11/20/2022   Respiratory tract infection 11/20/2022   QT prolongation 11/20/2022   Graves disease 11/20/2022   Colon cancer screening 11/20/2022   Plantar fasciitis of left foot 03/02/2022   Plantar fasciitis of right foot 03/02/2022   Knee pain, bilateral 02/16/2022   Pain in joint of right ankle 02/16/2022    Contusion of left knee 02/16/2022   Contusion of right knee 02/16/2022   Fibromyalgia 02/16/2022   Sprain of right ankle 02/16/2022   Pain in right foot 02/16/2022   Sepsis (HCC) 08/03/2020   Hyperthyroidism    Depression    Atypical chest pain 09/15/2017   PVC's (premature ventricular contractions) 09/15/2017   Aspergilloma (HCC) 06/29/2016   Sarcoidosis 03/23/2016   Pneumonia due to Pseudomonas (HCC) 02/10/2016   Pulmonary cavitary lesion    Hilar adenopathy    Current use of steroid medication 06/17/2015   Long term current use of systemic steroids 01/03/2015   Sinus tachycardia 01/03/2015   Fungal pneumonia 11/23/2014   Cough 11/22/2014   Shortness of breath 11/22/2014   Cavitary lesion of lung 11/22/2014     Past Surgical History   Past Surgical History:  Procedure Laterality Date   CESAREAN SECTION     1992/1994   COLONOSCOPY WITH PROPOFOL N/A 11/25/2022   Procedure: COLONOSCOPY WITH PROPOFOL;  Surgeon: Wyline Mood, MD;  Location: [facility] ENDOSCOPY;  Service: Gastroenterology;  Laterality: N/A;   ENDOBRONCHIAL ULTRASOUND N/A 01/27/2016   Procedure: ENDOBRONCHIAL ULTRASOUND;  Surgeon: Stephanie Acre, MD;  Location: [facility] ORS;  Service: Cardiopulmonary;  Laterality: N/A;   ESOPHAGOGASTRODUODENOSCOPY (EGD) WITH PROPOFOL N/A 11/21/2022   Procedure: ESOPHAGOGASTRODUODENOSCOPY (EGD) WITH PROPOFOL;  Surgeon: Jaynie Collins, DO;  Location: [facility]  ENDOSCOPY;  Service: Gastroenterology;  Laterality: N/A;   LYMPH NODE BIOPSY     VIDEO BRONCHOSCOPY N/A 01/27/2016   Procedure: VIDEO BRONCHOSCOPY WITH FLUORO;  Surgeon: Stephanie Acre, MD;  Location: [facility] ORS;  Service: Cardiopulmonary;  Laterality: N/A;     Home Medications   Prior to Admission medications   Medication Sig Start Date End Date Taking? Authorizing Provider  acetaminophen (TYLENOL) 500 MG tablet Take 500-1,000 mg by mouth every 6 (six) hours as needed for mild pain or fever.     [provider]  azithromycin  (ZITHROMAX) 250 MG tablet Take 250 mg by mouth daily. Patient not taking: Reported on 04/22/2023 11/19/22   [provider]  bisoprolol (ZEBETA) 10 MG tablet Take 2 tablets (20 mg total) by mouth daily. 08/21/21 02/17/22  End, Cristal Deer, MD  divalproex (DEPAKOTE ER) 500 MG 24 hr tablet Take 500 mg by mouth daily.     [provider]  fluticasone-salmeterol (ADVAIR DISKUS) 250-50 MCG/ACT AEPB Inhale 1 puff into the lungs 2 (two) times daily. 02/25/23   Erin Fulling, MD  ibuprofen (ADVIL) 200 MG tablet Take 400-600 mg by mouth every 6 (six) hours as needed for fever or mild pain.     [provider]  meloxicam (MOBIC) 7.5 MG tablet Take One tab PO BID with food 03/02/22   [provider]  methimazole (TAPAZOLE) 10 MG tablet Take 10 mg by mouth daily. 05/29/19   [provider]  methocarbamol (ROBAXIN) 500 MG tablet Take 500-1,000 mg by mouth 3 (three) times daily. Two to three times daily 10/20/22   [provider]  naproxen (NAPROSYN) 500 MG tablet Take 500 mg by mouth daily. 11/19/22   [provider]  omeprazole (PRILOSEC) 40 MG capsule Take 1 capsule (40 mg total) by mouth in the morning and at bedtime. 11/05/22 02/04/23  Wyline Mood, MD  ondansetron (ZOFRAN) 4 MG tablet Take 1 tablet (4 mg total) by mouth every 6 (six) hours as needed for nausea or vomiting. 08/20/20   Sharman Cheek, MD  polyethylene glycol powder (GLYCOLAX/MIRALAX) 17 GM/SCOOP powder Take 17 g by mouth as needed. 08/05/20   Hughie Closs, MD  predniSONE (DELTASONE) 20 MG tablet     [provider]  PROAIR HFA 108 (90 Base) MCG/ACT inhaler Inhale 1 puff into the lungs every 6 (six) hours as needed. 02/26/21   Erin Fulling, MD  rosuvastatin (CRESTOR) 20 MG tablet Take 20 mg by mouth daily. 06/23/22   [provider]  SAPHRIS 5 MG SUBL 24 hr tablet Place 1 tablet (5 mg total) under the tongue daily. Patient not taking: Reported on 10/01/2023 08/05/20    Hughie Closs, MD  tiZANidine (ZANAFLEX) 2 MG tablet     [provider]  traMADol (ULTRAM) 50 MG tablet Take 1 tablet by mouth every 6 (six) hours as needed. Patient not taking: Reported on 04/22/2023    [provider]  Vitamin D, Cholecalciferol, 25 MCG (1000 UT) TABS Take 500 Units by mouth daily. 08/05/20   Hughie Closs, MD     Allergies  Azithromycin, Latex, and Penicillin g   Family History   Family History  Problem Relation Age of Onset   Lung cancer Mother    Diabetes Sister    Breast cancer Neg Hx      Physical Exam  Triage Vital Signs: ED Triage Vitals  Encounter Vitals Group     BP 10/29/23 2109 (!) 143/82     Systolic BP Percentile --  Diastolic BP Percentile --      Pulse Rate 10/29/23 2109 (!) 129     Resp 10/29/23 2109 (!) 22     Temp 10/29/23 2109 98.5 F (36.9 C)     Temp Source 10/29/23 2109 Oral     SpO2 10/29/23 2109 93 %     Weight 10/29/23 2110 130 lb (59 kg)     Height 10/29/23 2110 4\' 9"  (1.448 m)     Head Circumference --      Peak Flow --      Pain Score 10/29/23 2109 8     Pain Loc --      Pain Education --      Exclude from Growth Chart --     Updated Vital Signs: BP 127/76   Pulse (!) 112   Temp 98.5 F (36.9 C) (Oral)   Resp 20   Ht 4\' 9"  (1.448 m)   Wt 59 kg   LMP 09/30/2020   SpO2 99%   BMI 28.13 kg/m    General: Awake, mild distress.  CV:  Tachycardic.  Good peripheral perfusion.  Resp:  Increased effort.  Slightly diminished aeration; otherwise CTAB.  Performing nebulizer treatment currently. Abd:  Nontender.  No distention.  Other:  No pedal edema.   ED Results / Procedures / Treatments  Labs (all labs ordered are listed, but only abnormal results are displayed) Labs Reviewed  COMPREHENSIVE METABOLIC PANEL - Abnormal; Notable for the following components:      Result Value   Sodium 134 (*)    Glucose, Bld 139 (*)    All other components within normal limits  CBC - Abnormal; Notable for  the following components:   MCV 78.5 (*)    MCH 25.5 (*)    All other components within normal limits  RESP PANEL BY RT-PCR (RSV, FLU A&B, COVID)  [id number]     EKG  ED ECG REPORT I, Zuly Belkin J, the attending physician, personally viewed and interpreted this ECG.   Date: 10/29/2023  EKG Time: 2112  Rate: 125  Rhythm: sinus tachycardia  Axis: Normal  Intervals:none  ST&T Change: Nonspecific    RADIOLOGY I have independently visualized and interpreted patient's imaging study as well as noted the radiology interpretation:  Chest x-ray: Stable chronic bronchiectasis and LUL mycetoma; no acute process  Official radiology report(s): DG Chest Port 1 View Result Date: 10/29/2023 CLINICAL DATA:  Short of breath and cough for 2 weeks EXAM: PORTABLE CHEST 1 VIEW COMPARISON:  11/20/2022 FINDINGS: Single frontal view of the chest demonstrates stable cardiac silhouette. Stable bronchiectasis with left upper lobe mycetoma again noted and unchanged. No new airspace disease, effusion, or pneumothorax. No acute bony abnormalities. IMPRESSION: 1. 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 No acute airspace disease. Electronically Signed   By: Sharlet Salina M.D.   On: 10/29/2023 22:08     PROCEDURES:  Critical Care performed: Yes, see critical care procedure note(s)  CRITICAL CARE Performed by: Irean Hong   Total critical care time: 30 minutes  Critical care time was exclusive of separately billable procedures and treating other patients.  Critical care was necessary to treat or prevent imminent or life-threatening deterioration.  Critical care was time spent personally by me on the following activities: development of treatment plan with patient and/or surrogate as well as nursing, discussions with consultants, evaluation of patient's response to treatment, examination of patient, obtaining history from patient or surrogate, ordering and performing treatments  and interventions,  ordering and review of laboratory studies, ordering and review of radiographic studies, pulse oximetry and re-evaluation of patient's condition.   Marland Kitchen1-3 Lead EKG Interpretation  Performed by: Irean Hong, MD Authorized by: Irean Hong, MD     Interpretation: abnormal     ECG rate:  115   ECG rate assessment: tachycardic     Rhythm: sinus tachycardia     Ectopy: none     Conduction: normal   Comments:     Patient placed on cardiac monitor to evaluate for arrhythmias    MEDICATIONS ORDERED IN ED: Medications  sodium chloride 0.9 % bolus 1,000 mL (has no administration in time range)  methylPREDNISolone sodium succinate (SOLU-MEDROL) 125 mg/2 mL injection 125 mg (has no administration in time range)  chlorpheniramine-HYDROcodone (TUSSIONEX) 10-8 MG/5ML suspension 5 mL (has no administration in time range)  ipratropium-albuterol (DUONEB) 0.5-2.5 (3) MG/3ML nebulizer solution 3 mL (3 mLs Nebulization Given 10/29/23 2247)  ipratropium-albuterol (DUONEB) 0.5-2.5 (3) MG/3ML nebulizer solution 3 mL (3 mLs Nebulization Given 10/29/23 2247)     IMPRESSION / MDM / ASSESSMENT AND PLAN / ED COURSE  I reviewed the triage vital signs and the nursing notes.                             53 year old female presenting with shortness of breath. Differential includes, but is not limited to, viral syndrome, bronchitis including COPD exacerbation, pneumonia, reactive airway disease including asthma, CHF including exacerbation with or without pulmonary/interstitial edema, pneumothorax, ACS, thoracic trauma, and pulmonary embolism.  I personally reviewed patient's records and note a pulmonology office visit from 04/22/2023 for follow-up shortness of breath.  Patient's presentation is most consistent with acute complicated illness / injury requiring diagnostic workup.  The patient is on the cardiac monitor to evaluate for evidence of arrhythmia and/or significant heart rate  changes.  Laboratory results unremarkable, respiratory panel is negative.  Chest x-ray stable.  Will check troponin.  Patient finishing up DuoNeb currently.  Will add 125 mg IV Solu-Medrol, Tussionex, IV fluids for tachycardia and reassess.  Clinical Course as of 10/30/23 0149  Sat Oct 30, 2023  0118 Patient states she is feeling much better.  Aeration improved.  Sleeping off her oxygen; oxygen reapplied.  Heart rate down to 106.  Will perform ambulation trial. [JS]  0148 Sats remained 96% on ambulation trial but heart rate jumped up to 125s and patient tachypneic.  Administer additional DuoNeb, consult hospitalist services for evaluation and admission. [JS]    Clinical Course User Index [JS] Irean Hong, MD     FINAL CLINICAL IMPRESSION(S) / ED DIAGNOSES   Final diagnoses:  SOB (shortness of breath)  COPD with acute exacerbation (HCC)     Rx / DC Orders   ED Discharge Orders     None        Note:  This document was prepared using Dragon voice recognition software and may include unintentional dictation errors.   Irean Hong, MD 10/30/23 [phone]

## 2023-10-29 NOTE — ED Triage Notes (Signed)
Pt presents to ER from home reports feeling shortness of breath, pt reports she uses oxygen at home. Pt reports she uses 2L/[place] at night, pt reports history of COPD. Pt reports has been feeling sick for the past 2 weeks. Pt talks in complete sentences no respiratory distress noted in triage.

## 2023-10-30 ENCOUNTER — Encounter: Payer: Self-pay | Admitting: Family Medicine

## 2023-10-30 DIAGNOSIS — Z1152 Encounter for screening for COVID-19: Secondary | ICD-10-CM | POA: Diagnosis not present

## 2023-10-30 DIAGNOSIS — K219 Gastro-esophageal reflux disease without esophagitis: Secondary | ICD-10-CM | POA: Diagnosis present

## 2023-10-30 DIAGNOSIS — E0590 Thyrotoxicosis, unspecified without thyrotoxic crisis or storm: Secondary | ICD-10-CM | POA: Diagnosis present

## 2023-10-30 DIAGNOSIS — J45901 Unspecified asthma with (acute) exacerbation: Secondary | ICD-10-CM | POA: Diagnosis present

## 2023-10-30 DIAGNOSIS — F32A Depression, unspecified: Secondary | ICD-10-CM | POA: Insufficient documentation

## 2023-10-30 DIAGNOSIS — Z91040 Latex allergy status: Secondary | ICD-10-CM | POA: Diagnosis not present

## 2023-10-30 DIAGNOSIS — Z880 Allergy status to penicillin: Secondary | ICD-10-CM | POA: Diagnosis not present

## 2023-10-30 DIAGNOSIS — M797 Fibromyalgia: Secondary | ICD-10-CM | POA: Diagnosis present

## 2023-10-30 DIAGNOSIS — I10 Essential (primary) hypertension: Secondary | ICD-10-CM | POA: Diagnosis present

## 2023-10-30 DIAGNOSIS — D860 Sarcoidosis of lung: Secondary | ICD-10-CM | POA: Diagnosis not present

## 2023-10-30 DIAGNOSIS — Z801 Family history of malignant neoplasm of trachea, bronchus and lung: Secondary | ICD-10-CM | POA: Diagnosis not present

## 2023-10-30 DIAGNOSIS — F419 Anxiety disorder, unspecified: Secondary | ICD-10-CM | POA: Diagnosis present

## 2023-10-30 DIAGNOSIS — E785 Hyperlipidemia, unspecified: Secondary | ICD-10-CM

## 2023-10-30 DIAGNOSIS — E871 Hypo-osmolality and hyponatremia: Secondary | ICD-10-CM | POA: Diagnosis present

## 2023-10-30 DIAGNOSIS — J479 Bronchiectasis, uncomplicated: Secondary | ICD-10-CM | POA: Diagnosis present

## 2023-10-30 DIAGNOSIS — Z881 Allergy status to other antibiotic agents status: Secondary | ICD-10-CM | POA: Diagnosis not present

## 2023-10-30 DIAGNOSIS — Z833 Family history of diabetes mellitus: Secondary | ICD-10-CM | POA: Diagnosis not present

## 2023-10-30 DIAGNOSIS — Z7951 Long term (current) use of inhaled steroids: Secondary | ICD-10-CM | POA: Diagnosis not present

## 2023-10-30 DIAGNOSIS — Z9981 Dependence on supplemental oxygen: Secondary | ICD-10-CM | POA: Diagnosis not present

## 2023-10-30 DIAGNOSIS — J441 Chronic obstructive pulmonary disease with (acute) exacerbation: Secondary | ICD-10-CM | POA: Diagnosis present

## 2023-10-30 DIAGNOSIS — Z79899 Other long term (current) drug therapy: Secondary | ICD-10-CM | POA: Diagnosis not present

## 2023-10-30 LAB — BASIC METABOLIC PANEL
Anion gap: 8 (ref 5–15)
BUN: 13 mg/dL (ref 6–20)
CO2: 22 mmol/L (ref 22–32)
Calcium: 8.7 mg/dL — ABNORMAL LOW (ref 8.9–10.3)
Chloride: 107 mmol/L (ref 98–111)
Creatinine, Ser: 0.81 mg/dL (ref 0.44–1.00)
GFR, Estimated: >60 mL/min (ref >60)
Glucose, Bld: 204 mg/dL — ABNORMAL HIGH (ref 70–99)
Potassium: 4.1 mmol/L (ref 3.5–5.1)
Sodium: 137 mmol/L (ref 135–145)

## 2023-10-30 LAB — TROPONIN I (HIGH SENSITIVITY): Troponin I (High Sensitivity): 7 ng/L (ref <18)

## 2023-10-30 LAB — CBC
HCT: 34.4 % — ABNORMAL LOW (ref 36.0–46.0)
Hemoglobin: 11.2 g/dL — ABNORMAL LOW (ref 12.0–15.0)
MCH: 25.7 pg — ABNORMAL LOW (ref 26.0–34.0)
MCHC: 32.6 g/dL (ref 30.0–36.0)
MCV: 78.9 fL — ABNORMAL LOW (ref 80.0–100.0)
Platelets: 202 10*3/uL (ref 150–400)
RBC: 4.36 MIL/uL (ref 3.87–5.11)
RDW: 13.5 % (ref 11.5–15.5)
WBC: 5.1 10*3/uL (ref 4.0–10.5)
nRBC: 0 % (ref 0.0–0.2)

## 2023-10-30 MED ORDER — SODIUM CHLORIDE 0.9 % IV SOLN: INTRAVENOUS | Status: DC

## 2023-10-30 MED ORDER — PREDNISONE 20 MG PO TABS
40.0000 mg | ORAL_TABLET | Freq: Every day | ORAL | Status: DC
  Administered 2023-10-30 – 2023-11-02 (×4): 40 mg via ORAL
  Filled 2023-10-30 (×4): qty 2

## 2023-10-30 MED ORDER — DIVALPROEX SODIUM ER 500 MG PO TB24
500.0000 mg | ORAL_TABLET | Freq: Every day | ORAL | Status: DC
  Administered 2023-10-30 – 2023-11-02 (×4): 500 mg via ORAL
  Filled 2023-10-30 (×4): qty 1

## 2023-10-30 MED ORDER — GUAIFENESIN 100 MG/5ML PO LIQD
5.0000 mL | ORAL | Status: DC | PRN
  Administered 2023-10-30 – 2023-11-02 (×4): 5 mL via ORAL
  Filled 2023-10-30 (×4): qty 10

## 2023-10-30 MED ORDER — METOCLOPRAMIDE HCL 5 MG/ML IJ SOLN: 5.0000 mg | Freq: Four times a day (QID) | INTRAMUSCULAR | Status: DC | PRN

## 2023-10-30 MED ORDER — MAGNESIUM HYDROXIDE 400 MG/5ML PO SUSP: 30.0000 mL | Freq: Every day | ORAL | Status: DC | PRN

## 2023-10-30 MED ORDER — BISOPROLOL FUMARATE 5 MG PO TABS
20.0000 mg | ORAL_TABLET | Freq: Every day | ORAL | Status: DC
  Administered 2023-10-30 – 2023-11-02 (×4): 20 mg via ORAL
  Filled 2023-10-30 (×4): qty 4

## 2023-10-30 MED ORDER — VITAMIN D3 25 MCG (1000 UNIT) PO TABS
500.0000 [IU] | ORAL_TABLET | Freq: Every day | ORAL | Status: DC
  Administered 2023-10-30 – 2023-11-02 (×4): 500 [IU] via ORAL
  Filled 2023-10-30: qty 1
  Filled 2023-10-30: qty 0.5
  Filled 2023-10-30 (×2): qty 1
  Filled 2023-10-30: qty 0.5
  Filled 2023-10-30: qty 1
  Filled 2023-10-30 (×2): qty 0.5

## 2023-10-30 MED ORDER — METHIMAZOLE 10 MG PO TABS
10.0000 mg | ORAL_TABLET | Freq: Every day | ORAL | Status: DC
  Administered 2023-10-30 – 2023-11-02 (×4): 10 mg via ORAL
  Filled 2023-10-30 (×4): qty 1

## 2023-10-30 MED ORDER — METHOCARBAMOL 500 MG PO TABS: 500.0000 mg | ORAL_TABLET | Freq: Three times a day (TID) | ORAL | Status: DC

## 2023-10-30 MED ORDER — ACETAMINOPHEN 650 MG RE SUPP: 650.0000 mg | Freq: Four times a day (QID) | RECTAL | Status: DC | PRN

## 2023-10-30 MED ORDER — ACETAMINOPHEN 325 MG PO TABS
650.0000 mg | ORAL_TABLET | Freq: Four times a day (QID) | ORAL | Status: DC | PRN
  Administered 2023-10-30 – 2023-10-31 (×2): 650 mg via ORAL
  Filled 2023-10-30 (×2): qty 2

## 2023-10-30 MED ORDER — POLYETHYLENE GLYCOL 3350 17 GM/SCOOP PO POWD: 17.0000 g | ORAL | Status: DC | PRN

## 2023-10-30 MED ORDER — PANTOPRAZOLE SODIUM 40 MG PO TBEC
40.0000 mg | DELAYED_RELEASE_TABLET | Freq: Every day | ORAL | Status: DC
  Administered 2023-10-30 – 2023-11-02 (×4): 40 mg via ORAL
  Filled 2023-10-30 (×4): qty 1

## 2023-10-30 MED ORDER — ROSUVASTATIN CALCIUM 10 MG PO TABS
20.0000 mg | ORAL_TABLET | Freq: Every day | ORAL | Status: DC
  Administered 2023-10-30 – 2023-11-02 (×4): 20 mg via ORAL
  Filled 2023-10-30 (×4): qty 2

## 2023-10-30 MED ORDER — ENOXAPARIN SODIUM 40 MG/0.4ML IJ SOSY
40.0000 mg | PREFILLED_SYRINGE | INTRAMUSCULAR | Status: DC
  Administered 2023-10-30 – 2023-11-02 (×4): 40 mg via SUBCUTANEOUS
  Filled 2023-10-30 (×4): qty 0.4

## 2023-10-30 MED ORDER — TRAZODONE HCL 50 MG PO TABS: 25.0000 mg | ORAL_TABLET | Freq: Every evening | ORAL | Status: DC | PRN

## 2023-10-30 NOTE — ED Notes (Signed)
 Pt given something to eat per request

## 2023-10-30 NOTE — Assessment & Plan Note (Addendum)
-   This is likely associated with sarcoidosis flare as well. - The patient will be admitted to a medically monitored bed. - We will place the patient IV steroid therapy with IV Solu-Medrol as well as nebulized bronchodilator therapy with duonebs q.i.d. and q.4 hours p.r.n.Marland Kitchen - Mucolytic therapy will be provided with Mucinex and antibiotic therapy with IV Rocephin especially given underlying bronchiectasis. - O2 protocol will be followed. - Will hold off long-acting beta agonist.

## 2023-10-30 NOTE — Progress Notes (Signed)
  Progress Note   Patient: Jennifer [name] ZOX:[id number] DOB: [birth year] DOA: 10/29/2023     0 DOS: the patient was seen and examined on 10/30/2023   Brief hospital course: KEIRRA ZEIMET is a 53 y.o. female with medical history significant for asthma, sarcoidosis, depression, and anxiety, fibromyalgia and hypothyroidism, who presented to the emergency room with acute onset of Worsening dyspnea with associated cough and wheezing. Given steroids and scheduled bronchodilator.   Principal Problem:   Asthma, chronic, unspecified asthma severity, with acute exacerbation Active Problems:   Essential hypertension   Hyperthyroidism   Dyslipidemia   Anxiety and depression   GERD without esophagitis   Assessment and Plan:  * Asthma, chronic, unspecified asthma severity, with acute exacerbation Sarcoidosis flare. Patient has both asthma and sarcoidosis.  She has worsening short of breath for the last few days, asthma and a flare of sarcoidosis cannot be distinguished, but the treatment is the same.  Will continue steroids and bronchodilator.   Essential hypertension - We will continue antihypertensive therapy.  Hyperthyroidism - We will continue Tapazole.  Dyslipidemia - We will continue statin therapy.  GERD without esophagitis - We will continue PPI therapy.  Anxiety and depression - We will continue citalopram, lurasidone and doxepin.      Subjective:  Still shortness of breath today, cough, nonproductive.  Physical Exam: Vitals:   10/30/23 0242 10/30/23 0244 10/30/23 0316 10/30/23 0905  BP: 124/70  114/61 116/69  Pulse: (!) 111 (!) 119 (!) 108 (!) 102  Resp: (!) 24 20 16 19   Temp:   98.8 F (37.1 C) 97.8 F (36.6 C)  TempSrc:      SpO2: 92% 99% 98% 99%  Weight:      Height:       General exam: Appears calm and comfortable  Respiratory system: A few wheezes.Marland Kitchen Respiratory effort normal. Cardiovascular system: S1 & S2 heard, RRR. No JVD, murmurs, rubs, gallops  or clicks. No pedal edema. Gastrointestinal system: Abdomen is nondistended, soft and nontender. No organomegaly or masses felt. Normal bowel sounds heard. Central nervous system: Alert and oriented. No focal neurological deficits. Extremities: Symmetric 5 x 5 power. Skin: No rashes, lesions or ulcers Psychiatry: Judgement and insight appear normal. Mood & affect appropriate.    Data Reviewed:  Chest x-ray and the lab results reviewed.  Family Communication: None  Disposition: Status is: Inpatient Remains inpatient appropriate because: Severity of disease, IV treatment.     Time spent: no charge minutes  Author: Marrion Coy, MD 10/30/2023 12:30 PM  For on call review [email or web address].

## 2023-10-30 NOTE — Assessment & Plan Note (Signed)
 -  We will continue PPI therapy

## 2023-10-30 NOTE — Plan of Care (Signed)
  Problem: Education: Goal: Knowledge of General Education information will improve Description: Including pain rating scale, medication(s)/side effects and non-pharmacologic comfort measures Outcome: Progressing   Problem: Health Behavior/Discharge Planning: Goal: Ability to manage health-related needs will improve Outcome: Progressing   Problem: Clinical Measurements: Goal: Ability to maintain clinical measurements within normal limits will improve Outcome: Progressing Goal: Will remain free from infection Outcome: Progressing Goal: Diagnostic test results will improve Outcome: Progressing Goal: Respiratory complications will improve Outcome: Progressing Goal: Cardiovascular complication will be avoided Outcome: Progressing   Problem: Activity: Goal: Risk for activity intolerance will decrease Outcome: Progressing   Problem: Nutrition: Goal: Adequate nutrition will be maintained Outcome: Progressing   Problem: Coping: Goal: Level of anxiety will decrease Outcome: Progressing   Problem: Pain Managment: Goal: General experience of comfort will improve and/or be controlled Outcome: Progressing   Problem: Elimination: Goal: Will not experience complications related to bowel motility Outcome: Progressing Goal: Will not experience complications related to urinary retention Outcome: Progressing   Problem: Safety: Goal: Ability to remain free from injury will improve Outcome: Progressing

## 2023-10-30 NOTE — Assessment & Plan Note (Signed)
-   We will continue Tapazole.

## 2023-10-30 NOTE — Assessment & Plan Note (Signed)
-   We will continue antihypertensive therapy.

## 2023-10-30 NOTE — Hospital Course (Signed)
 Jennifer [name] is a 53 y.o. female with medical history significant for asthma, sarcoidosis, depression, and anxiety, fibromyalgia and hypothyroidism, who presented to the emergency room with acute onset of Worsening dyspnea with associated cough and wheezing. Given steroids and scheduled bronchodilator.

## 2023-10-30 NOTE — Assessment & Plan Note (Signed)
 -  We will continue statin therapy.

## 2023-10-30 NOTE — H&P (Signed)
 [place]   PATIENT NAME: Jennifer [name]    MR#:  [id number]  DATE OF BIRTH:  03-02-1971  DATE OF ADMISSION:  10/29/2023  PRIMARY CARE PHYSICIAN: Dudley Major, FNP   Patient is coming from: Home  REQUESTING/REFERRING PHYSICIAN: Chiquita Loth, MD  CHIEF COMPLAINT:  Shortness of breath  HISTORY OF PRESENT ILLNESS:  Jennifer [name] is a 53 y.o. female with medical history significant for asthma, sarcoidosis, depression, and anxiety, fibromyalgia and hypothyroidism, who presented to the emergency room with acute onset of Worsening dyspnea with associated cough and wheezing.  The patient is on home O2 at 2 L/min at night.  Her symptoms have been going on over the last couple weeks and significantly worsening today.  No fever or chills.  No nausea or vomiting or abdominal pain.  No chest pain or palpitations.  No dysuria, oliguria or hematuria or flank pain.  ED Course: Upon presentation to the emergency room, BP was 143/82, respiratory rate 22 and heart rate 129 with pulse oximetry of 93% on room air and 99% on 2 L of O2 by nasal cannula.  Labs revealed mild hyponatremia 134 and otherwise unremarkable CMP.  CBC showed microcytosis.  EKG as reviewed by me : EKG showed sinus tachycardia with rate 125 with biatrial enlargement and right axis deviation. Imaging: Portable chest x-ray showed the following: 1. 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 No acute airspace disease.  The patient was given 2 DuoNebs, 1 L bolus of IV normal saline and 125 mg IV Solu-Medrol.  The patient will be admitted to a medical telemetry bed for further evaluation and management. PAST MEDICAL HISTORY:   Past Medical History:  Diagnosis Date   Anxiety    Asthma    Chest pain    a. 08/2018 MV: EF 62%, no ischemia/scar.   Depression    Diastolic dysfunction    a. 09/2017 Echo: Nl LV size/fxn/wall thickness; b. 07/2020 Echo: EF 60-65%, no rwma, Gr1 DD. Nl RV size/fxn. Mild MR.    Fibromyalgia    Hyperthyroidism    Incontinence    Lymphadenopathy    Pneumonia    bilateral community acquired    PVC's (premature ventricular contractions)    a. 06/2016 24h Holter: Sinus rhythm, freq PVCs (13% burden), rare PACs; b. 03/2017 24h Holter: Predominantly sinus rhythm. Freq PVCs (6% burden); c. 08/2020 cMRI: EF 50%. Nl LV size/thickness. No LGE to suggest scar/infiltration. Nl RV size/fxn. Nl biatrial sizes. No significant valvular abnormalities.   Sarcoidosis    Sepsis (HCC)    Shortness of breath dyspnea    Tachycardia     PAST SURGICAL HISTORY:   Past Surgical History:  Procedure Laterality Date   CESAREAN SECTION     1992/1994   COLONOSCOPY WITH PROPOFOL N/A 11/25/2022   Procedure: COLONOSCOPY WITH PROPOFOL;  Surgeon: Wyline Mood, MD;  Location: [facility] ENDOSCOPY;  Service: Gastroenterology;  Laterality: N/A;   ENDOBRONCHIAL ULTRASOUND N/A 01/27/2016   Procedure: ENDOBRONCHIAL ULTRASOUND;  Surgeon: Stephanie Acre, MD;  Location: [facility] ORS;  Service: Cardiopulmonary;  Laterality: N/A;   ESOPHAGOGASTRODUODENOSCOPY (EGD) WITH PROPOFOL N/A 11/21/2022   Procedure: ESOPHAGOGASTRODUODENOSCOPY (EGD) WITH PROPOFOL;  Surgeon: Jaynie Collins, DO;  Location: [facility] ENDOSCOPY;  Service: Gastroenterology;  Laterality: N/A;   LYMPH NODE BIOPSY     VIDEO BRONCHOSCOPY N/A 01/27/2016   Procedure: VIDEO BRONCHOSCOPY WITH FLUORO;  Surgeon: Stephanie Acre, MD;  Location: [facility] ORS;  Service: Cardiopulmonary;  Laterality: N/A;  SOCIAL HISTORY:   Social History   Tobacco Use   Smoking status: Never   Smokeless tobacco: Never  Substance Use Topics   Alcohol use: No    Alcohol/week: 0.0 standard drinks of alcohol    FAMILY HISTORY:   Family History  Problem Relation Age of Onset   Lung cancer Mother    Diabetes Sister    Breast cancer Neg Hx     DRUG ALLERGIES:   Allergies  Allergen Reactions   Azithromycin Nausea And Vomiting    Also noted to have prolong qtc on EKG  after    Latex Itching   Penicillin G Hives    REVIEW OF SYSTEMS:   ROS As per history of present illness. All pertinent systems were reviewed above. Constitutional, HEENT, cardiovascular, respiratory, GI, GU, musculoskeletal, neuro, psychiatric, endocrine, integumentary and hematologic systems were reviewed and are otherwise negative/unremarkable except for positive findings mentioned above in the HPI.   MEDICATIONS AT HOME:   Prior to Admission medications   Medication Sig Start Date End Date Taking? Authorizing Provider  acetaminophen (TYLENOL) 500 MG tablet Take 500-1,000 mg by mouth every 6 (six) hours as needed for mild pain or fever.     [provider]  azithromycin (ZITHROMAX) 250 MG tablet Take 250 mg by mouth daily. Patient not taking: Reported on 04/22/2023 11/19/22   [provider]  bisoprolol (ZEBETA) 10 MG tablet Take 2 tablets (20 mg total) by mouth daily. 08/21/21 02/17/22  End, Cristal Deer, MD  divalproex (DEPAKOTE ER) 500 MG 24 hr tablet Take 500 mg by mouth daily.     [provider]  fluticasone-salmeterol (ADVAIR DISKUS) 250-50 MCG/ACT AEPB Inhale 1 puff into the lungs 2 (two) times daily. 02/25/23   Erin Fulling, MD  ibuprofen (ADVIL) 200 MG tablet Take 400-600 mg by mouth every 6 (six) hours as needed for fever or mild pain.     [provider]  meloxicam (MOBIC) 7.5 MG tablet Take One tab PO BID with food 03/02/22   [provider]  methimazole (TAPAZOLE) 10 MG tablet Take 10 mg by mouth daily. 05/29/19   [provider]  methocarbamol (ROBAXIN) 500 MG tablet Take 500-1,000 mg by mouth 3 (three) times daily. Two to three times daily 10/20/22   [provider]  naproxen (NAPROSYN) 500 MG tablet Take 500 mg by mouth daily. 11/19/22   [provider]  omeprazole (PRILOSEC) 40 MG capsule Take 1 capsule (40 mg total) by mouth in the morning and at bedtime. 11/05/22 02/04/23  Wyline Mood, MD  ondansetron  (ZOFRAN) 4 MG tablet Take 1 tablet (4 mg total) by mouth every 6 (six) hours as needed for nausea or vomiting. 08/20/20   Sharman Cheek, MD  polyethylene glycol powder (GLYCOLAX/MIRALAX) 17 GM/SCOOP powder Take 17 g by mouth as needed. 08/05/20   Hughie Closs, MD  predniSONE (DELTASONE) 20 MG tablet     [provider]  PROAIR HFA 108 (90 Base) MCG/ACT inhaler Inhale 1 puff into the lungs every 6 (six) hours as needed. 02/26/21   Erin Fulling, MD  rosuvastatin (CRESTOR) 20 MG tablet Take 20 mg by mouth daily. 06/23/22   [provider]  SAPHRIS 5 MG SUBL 24 hr tablet Place 1 tablet (5 mg total) under the tongue daily. Patient not taking: Reported on 10/01/2023 08/05/20   Hughie Closs, MD  tiZANidine (ZANAFLEX) 2 MG tablet     [provider]  traMADol (ULTRAM) 50 MG tablet Take 1 tablet by  mouth every 6 (six) hours as needed. Patient not taking: Reported on 04/22/2023    [provider]  Vitamin D, Cholecalciferol, 25 MCG (1000 UT) TABS Take 500 Units by mouth daily. 08/05/20   Hughie Closs, MD      VITAL SIGNS:  Blood pressure 124/70, pulse (!) 119, temperature 98.7 F (37.1 C), temperature source Oral, resp. rate 20, height 4\' 9"  (1.448 m), weight 59 kg, last menstrual period 09/30/2020, SpO2 99%.  PHYSICAL EXAMINATION:  Physical Exam  GENERAL:  53 y.o.-year-old female patient lying in the bed with mild respiratory distress with conversational dyspnea EYES: Pupils equal, round, reactive to light and accommodation. No scleral icterus. Extraocular muscles intact.  HEENT: Head atraumatic, normocephalic. Oropharynx and nasopharynx clear.  NECK:  Supple, no jugular venous distention. No thyroid enlargement, no tenderness.  LUNGS: Diffuse expiratory wheezes with tight expiratory airflow and harsh vesicular breathing.  No use of accessory muscles of respiration.  CARDIOVASCULAR: Regular rate and rhythm, S1, S2 normal. No murmurs, rubs, or gallops.   ABDOMEN: Soft, nondistended, nontender. Bowel sounds present. No organomegaly or mass.  EXTREMITIES: No pedal edema, cyanosis, or clubbing.  NEUROLOGIC: Cranial nerves II through XII are intact. Muscle strength 5/5 in all extremities. Sensation intact. Gait not checked.  PSYCHIATRIC: The patient is alert and oriented x 3.  Normal affect and good eye contact. SKIN: No obvious rash, lesion, or ulcer.   LABORATORY PANEL:   CBC Recent Labs  Lab 10/29/23 2117  WBC 7.9  HGB 12.7  HCT 39.1  PLT 221   ------------------------------------------------------------------------------------------------------------------  Chemistries  Recent Labs  Lab 10/29/23 2117  NA 134*  K 3.7  CL 98  CO2 22  GLUCOSE 139*  BUN 11  CREATININE 0.82  CALCIUM 9.5  AST 19  ALT 19  ALKPHOS 102  BILITOT 0.9   ------------------------------------------------------------------------------------------------------------------  Cardiac Enzymes No results for input(s): "TROPONINI" in the last 168 hours. ------------------------------------------------------------------------------------------------------------------  RADIOLOGY:  DG Chest Port 1 View Result Date: 10/29/2023 CLINICAL DATA:  Short of breath and cough for 2 weeks EXAM: PORTABLE CHEST 1 VIEW COMPARISON:  11/20/2022 FINDINGS: Single frontal view of the chest demonstrates stable cardiac silhouette. Stable bronchiectasis with left upper lobe mycetoma again noted and unchanged. No new airspace disease, effusion, or pneumothorax. No acute bony abnormalities. IMPRESSION: 1. 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 No acute airspace disease. Electronically Signed   By: Sharlet Salina M.D.   On: 10/29/2023 22:08      IMPRESSION AND PLAN:  Assessment and Plan: * Asthma, chronic, unspecified asthma severity, with acute exacerbation - This is likely associated with sarcoidosis flare as well. - The patient will be  admitted to a medically monitored bed. - We will place the patient IV steroid therapy with IV Solu-Medrol as well as nebulized bronchodilator therapy with duonebs q.i.d. and q.4 hours p.r.n.Marland Kitchen - Mucolytic therapy will be provided with Mucinex and antibiotic therapy with IV Rocephin especially given underlying bronchiectasis. - O2 protocol will be followed. - Will hold off long-acting beta agonist.   Essential hypertension - We will continue antihypertensive therapy.  Hyperthyroidism - We will continue Tapazole.  Dyslipidemia - We will continue statin therapy.  GERD without esophagitis - We will continue PPI therapy.  Anxiety and depression - We will continue citalopram, lurasidone and doxepin.   DVT prophylaxis: Lovenox.  Advanced Care Planning:  Code Status: full code.  Family Communication:  The plan of care was discussed in details with the patient (and family).  I answered all questions. The patient agreed to proceed with the above mentioned plan. Further management will depend upon hospital course. Disposition Plan: Back to previous home environment Consults called: none.  All the records are reviewed and case discussed with ED provider.  Status is: Inpatient  At the time of the admission, it appears that the appropriate admission status for this patient is inpatient.  This is judged to be reasonable and necessary in order to provide the required intensity of service to ensure the patient's safety given the presenting symptoms, physical exam findings and initial radiographic and laboratory data in the context of comorbid conditions.  The patient requires inpatient status due to high intensity of service, high risk of further deterioration and high frequency of surveillance required.  I certify that at the time of admission, it is my clinical judgment that the patient will require inpatient hospital care extending more than 2 midnights.                            Dispo: The  patient is from: Home              Anticipated d/c is to: Home              Patient currently is not medically stable to d/c.              Difficult to place patient: No  Hannah Beat M.D on 10/30/2023 at 2:59 AM  [facility]   From 7 PM-7 AM, contact night-coverage [email or web address]  CC: Primary care physician; Dudley Major, FNP

## 2023-10-30 NOTE — Assessment & Plan Note (Signed)
-   We will continue citalopram, lurasidone and doxepin.

## 2023-10-31 DIAGNOSIS — I10 Essential (primary) hypertension: Secondary | ICD-10-CM | POA: Diagnosis not present

## 2023-10-31 DIAGNOSIS — D860 Sarcoidosis of lung: Secondary | ICD-10-CM | POA: Diagnosis not present

## 2023-10-31 DIAGNOSIS — J45901 Unspecified asthma with (acute) exacerbation: Secondary | ICD-10-CM | POA: Diagnosis not present

## 2023-10-31 MED ORDER — HYDROCOD POLI-CHLORPHE POLI ER 10-8 MG/5ML PO SUER
5.0000 mL | Freq: Two times a day (BID) | ORAL | Status: DC
  Administered 2023-10-31 – 2023-11-01 (×4): 5 mL via ORAL
  Filled 2023-10-31 (×5): qty 5

## 2023-10-31 MED ORDER — MENTHOL 3 MG MT LOZG
1.0000 | LOZENGE | OROMUCOSAL | Status: DC | PRN
  Administered 2023-10-31: 3 mg via ORAL
  Filled 2023-10-31: qty 9

## 2023-10-31 MED ORDER — DOXYCYCLINE HYCLATE 100 MG PO TABS
100.0000 mg | ORAL_TABLET | Freq: Two times a day (BID) | ORAL | Status: DC
  Administered 2023-10-31 – 2023-11-02 (×5): 100 mg via ORAL
  Filled 2023-10-31 (×5): qty 1

## 2023-10-31 NOTE — Progress Notes (Signed)
 Vital signs rechecked via RN. See updated vitals no yellow mews intervention needed.

## 2023-10-31 NOTE — Progress Notes (Signed)
  Progress Note   Patient: Jennifer [name] UJW:[id number] DOB: 1971-03-29 DOA: 10/29/2023     1 DOS: the patient was seen and examined on 10/31/2023   Brief hospital course: SHERYL SAINTIL is a 53 y.o. female with medical history significant for asthma, sarcoidosis, depression, and anxiety, fibromyalgia and hypothyroidism, who presented to the emergency room with acute onset of Worsening dyspnea with associated cough and wheezing. Given steroids and scheduled bronchodilator.   Principal Problem:   Asthma, chronic, unspecified asthma severity, with acute exacerbation Active Problems:   Essential hypertension   Hyperthyroidism   Dyslipidemia   Sarcoidosis of lung (HCC)   Anxiety and depression   GERD without esophagitis   Assessment and Plan:  * Asthma, chronic, unspecified asthma severity, with acute exacerbation Sarcoidosis flare. Patient has both asthma and sarcoidosis.  She has worsening short of breath for the last few days, asthma and a flare of sarcoidosis cannot be distinguished, but the treatment is the same.  Will continue steroids and bronchodilator. Patient still has significant cough with a yellow mucus, some wheezing. But off oxygen. I will continue current treatment, also added doxycycline as well as Tussionex.     Essential hypertension - We will continue antihypertensive therapy.   Hyperthyroidism - We will continue Tapazole.   Dyslipidemia - We will continue statin therapy.   GERD without esophagitis - We will continue PPI therapy.   Anxiety and depression - We will continue citalopram, lurasidone and doxepin.     Subjective:  Patient has increased short of breath and wheezing today, but oxygenation is better off oxygen.  Has significant mount of white and yellow mucus.  Physical Exam: Vitals:   10/30/23 1937 10/31/23 0341 10/31/23 0843 10/31/23 0852  BP: 121/73 119/76 (!) 80/50 (!) 142/82  Pulse: 96 86 84 89  Resp: 16 16 20 20   Temp: 98.4 F  (36.9 C) 97.9 F (36.6 C) 98.9 F (37.2 C)   TempSrc: Oral Oral Oral   SpO2: 98% 100% 98% 98%  Weight:      Height:       General exam: Appears calm and comfortable  Respiratory system: Diffuse wheezes. Respiratory effort normal. Cardiovascular system: S1 & S2 heard, RRR. No JVD, murmurs, rubs, gallops or clicks. No pedal edema. Gastrointestinal system: Abdomen is nondistended, soft and nontender. No organomegaly or masses felt. Normal bowel sounds heard. Central nervous system: Alert and oriented x3. No focal neurological deficits. Extremities: Symmetric 5 x 5 power. Skin: No rashes, lesions or ulcers Psychiatry: Judgement and insight appear normal. Mood & affect appropriate.    Data Reviewed:  Lab results reviewed.  Family Communication: none  Disposition: Status is: Inpatient Remains inpatient appropriate because: Severity of disease.     Time spent: 35 minutes  Author: Marrion Coy, MD 10/31/2023 1:29 PM  For on call review [email or web address].

## 2023-10-31 NOTE — Plan of Care (Signed)

## 2023-10-31 NOTE — Plan of Care (Signed)

## 2023-11-01 DIAGNOSIS — I10 Essential (primary) hypertension: Secondary | ICD-10-CM | POA: Diagnosis not present

## 2023-11-01 DIAGNOSIS — D860 Sarcoidosis of lung: Secondary | ICD-10-CM | POA: Diagnosis not present

## 2023-11-01 DIAGNOSIS — J45901 Unspecified asthma with (acute) exacerbation: Secondary | ICD-10-CM | POA: Diagnosis not present

## 2023-11-01 MED ORDER — IPRATROPIUM-ALBUTEROL 0.5-2.5 (3) MG/3ML IN SOLN
3.0000 mL | Freq: Three times a day (TID) | RESPIRATORY_TRACT | Status: DC
  Administered 2023-11-01 – 2023-11-02 (×3): 3 mL via RESPIRATORY_TRACT
  Filled 2023-11-01 (×3): qty 3

## 2023-11-01 MED ORDER — IPRATROPIUM-ALBUTEROL 0.5-2.5 (3) MG/3ML IN SOLN
3.0000 mL | Freq: Four times a day (QID) | RESPIRATORY_TRACT | Status: DC
  Administered 2023-11-01: 3 mL via RESPIRATORY_TRACT
  Filled 2023-11-01: qty 3

## 2023-11-01 MED ORDER — IPRATROPIUM-ALBUTEROL 0.5-2.5 (3) MG/3ML IN SOLN: 3.0000 mL | Freq: Four times a day (QID) | RESPIRATORY_TRACT | Status: DC | PRN

## 2023-11-01 NOTE — Plan of Care (Signed)

## 2023-11-01 NOTE — Progress Notes (Signed)
  Progress Note   Patient: Jennifer [name] GNF:[id number] DOB: 01-Dec-1970 DOA: 10/29/2023     2 DOS: the patient was seen and examined on 11/01/2023   Brief hospital course: Jennifer [name] is a 53 y.o. female with medical history significant for asthma, sarcoidosis, depression, and anxiety, fibromyalgia and hypothyroidism, who presented to the emergency room with acute onset of Worsening dyspnea with associated cough and wheezing. Given steroids and scheduled bronchodilator.   Principal Problem:   Asthma, chronic, unspecified asthma severity, with acute exacerbation Active Problems:   Essential hypertension   Hyperthyroidism   Dyslipidemia   Sarcoidosis of lung (HCC)   Anxiety and depression   GERD without esophagitis   Assessment and Plan: * Asthma, chronic, unspecified asthma severity, with acute exacerbation Sarcoidosis flare. Patient has both asthma and sarcoidosis.  She has worsening short of breath for the last few days, asthma and a flare of sarcoidosis cannot be distinguished, but the treatment is the same.  Will continue steroids and bronchodilator. Patient still has significant cough with a yellow mucus, some wheezing. But off oxygen.  added doxycycline as well as Tussionex on 2/23 due to worsening cough, yellow mucus. Condition seem to be better today, continue treatment for another day.  Most likely discharge home tomorrow.     Essential hypertension - We will continue antihypertensive therapy.   Hyperthyroidism - We will continue Tapazole.   Dyslipidemia - We will continue statin therapy.   GERD without esophagitis - We will continue PPI therapy.   Anxiety and depression - We will continue citalopram, lurasidone and doxepin.      Subjective:  She has significant cough, nonproductive.  Short of breath and wheezing appear to be improving.  Physical Exam: Vitals:   10/31/23 1543 10/31/23 1945 11/01/23 0303 11/01/23 0908  BP: (!) 99/59 116/65 (!)  143/87 126/84  Pulse: 77 80 70 69  Resp: 15 18 19 18   Temp: 98.1 F (36.7 C) 97.8 F (36.6 C) 97.8 F (36.6 C) 97.7 F (36.5 C)  TempSrc:  Oral Oral Oral  SpO2: 96% 98% 100% 98%  Weight:      Height:       General exam: Appears calm and comfortable  Respiratory system: Some wheezing. Respiratory effort normal. Cardiovascular system: S1 & S2 heard, RRR. No JVD, murmurs, rubs, gallops or clicks. No pedal edema. Gastrointestinal system: Abdomen is nondistended, soft and nontender. No organomegaly or masses felt. Normal bowel sounds heard. Central nervous system: Alert and oriented. No focal neurological deficits. Extremities: Symmetric 5 x 5 power. Skin: No rashes, lesions or ulcers Psychiatry: Judgement and insight appear normal. Mood & affect appropriate.    Data Reviewed:  Lab results reviewed.  Family Communication: None  Disposition: Status is: Inpatient Remains inpatient appropriate because: Severity of disease,     Time spent: 35 minutes  Author: Marrion Coy, MD 11/01/2023 10:44 AM  For on call review [email or web address].

## 2023-11-02 DIAGNOSIS — D860 Sarcoidosis of lung: Secondary | ICD-10-CM | POA: Diagnosis not present

## 2023-11-02 DIAGNOSIS — I10 Essential (primary) hypertension: Secondary | ICD-10-CM | POA: Diagnosis not present

## 2023-11-02 DIAGNOSIS — J45901 Unspecified asthma with (acute) exacerbation: Secondary | ICD-10-CM | POA: Diagnosis not present

## 2023-11-02 MED ORDER — PREDNISONE 20 MG PO TABS: ORAL_TABLET | ORAL | 0 refills | Status: AC

## 2023-11-02 MED ORDER — DOXYCYCLINE HYCLATE 100 MG PO TABS: 100.0000 mg | ORAL_TABLET | Freq: Two times a day (BID) | ORAL | 0 refills | Status: AC

## 2023-11-02 MED ORDER — HYDROCOD POLI-CHLORPHE POLI ER 10-8 MG/5ML PO SUER: 5.0000 mL | Freq: Two times a day (BID) | ORAL | 0 refills | Status: AC

## 2023-11-02 NOTE — Discharge Summary (Signed)
 Physician Discharge Summary   Patient: Jennifer [name] MRN: [id number] DOB: 10/30/1970  Admit date:     10/29/2023  Discharge date: 11/02/23  Discharge Physician: Marrion Coy   PCP: Dudley Major, FNP   Recommendations at discharge:   Follow-up with PCP in 1 week.  Discharge Diagnoses: Principal Problem:   Asthma, chronic, unspecified asthma severity, with acute exacerbation Active Problems:   Essential hypertension   Hyperthyroidism   Dyslipidemia   Sarcoidosis of lung (HCC)   Anxiety and depression   GERD without esophagitis  Resolved Problems:   * No resolved hospital problems. * Hyponatremia. Hospital Course: Jennifer [name] is a 53 y.o. female with medical history significant for asthma, sarcoidosis, depression, and anxiety, fibromyalgia and hypothyroidism, who presented to the emergency room with acute onset of Worsening dyspnea with associated cough and wheezing. Given steroids and scheduled bronchodilator. Condition has improved, off oxygen.  Short of breath much improved.  Medically stable to be discharged.  Assessment and Plan:   Asthma, chronic, unspecified asthma severity, with acute exacerbation Sarcoidosis flare. Patient has both asthma and sarcoidosis.  She has worsening short of breath for the last few days, asthma and a flare of sarcoidosis cannot be distinguished, but the treatment is the same.  Will continue steroids and bronchodilator. Patient still has significant cough with a yellow mucus, some wheezing. But off oxygen.  added doxycycline as well as Tussionex on 2/23 due to worsening cough, yellow mucus. Patient condition much improved today, bronchospasm much better.  Medically stable for discharge.     Essential hypertension - We will continue antihypertensive therapy.   Hyperthyroidism - We will continue Tapazole.   Dyslipidemia - We will continue statin therapy.   GERD without esophagitis - We will continue PPI therapy.   Anxiety and  depression - We will continue citalopram, lurasidone and doxepin.      Consultants: None Procedures performed: None  Disposition: Home Diet recommendation:  Discharge Diet Orders (From admission, onward)     Start     Ordered   11/02/23 0000  Diet - low sodium heart healthy        11/02/23 1004           Cardiac diet DISCHARGE MEDICATION: Allergies as of 11/02/2023       Reactions   Azithromycin Nausea And Vomiting   Also noted to have prolong qtc on EKG after    Latex Itching   Penicillin G Hives        Medication List     STOP taking these medications    azithromycin 250 MG tablet Commonly known as: ZITHROMAX   methocarbamol 500 MG tablet Commonly known as: ROBAXIN   naproxen 500 MG tablet Commonly known as: NAPROSYN   Saphris 5 MG Subl 24 hr tablet Generic drug: asenapine   traMADol 50 MG tablet Commonly known as: ULTRAM       TAKE these medications    acetaminophen 500 MG tablet Commonly known as: TYLENOL Take 500-1,000 mg by mouth every 6 (six) hours as needed for mild pain or fever.   bisoprolol 10 MG tablet Commonly known as: ZEBETA Take 2 tablets (20 mg total) by mouth daily.   chlorpheniramine-HYDROcodone 10-8 MG/5ML Commonly known as: TUSSIONEX Take 5 mLs by mouth every 12 (twelve) hours for 3 days.   divalproex 500 MG 24 hr tablet Commonly known as: DEPAKOTE ER Take 500 mg by mouth daily.   doxycycline 100 MG tablet Commonly known as: VIBRA-TABS Take 1 tablet (  100 mg total) by mouth every 12 (twelve) hours for 4 days.   fluticasone-salmeterol 250-50 MCG/ACT Aepb Commonly known as: Advair Diskus Inhale 1 puff into the lungs 2 (two) times daily.   ibuprofen 200 MG tablet Commonly known as: ADVIL Take 400-600 mg by mouth every 6 (six) hours as needed for fever or mild pain.   meloxicam 7.5 MG tablet Commonly known as: MOBIC Take One tab PO BID with food   methimazole 10 MG tablet Commonly known as: TAPAZOLE Take 10 mg  by mouth daily.   omeprazole 40 MG capsule Commonly known as: PRILOSEC Take 1 capsule (40 mg total) by mouth in the morning and at bedtime.   ondansetron 4 MG tablet Commonly known as: Zofran Take 1 tablet (4 mg total) by mouth every 6 (six) hours as needed for nausea or vomiting.   polyethylene glycol powder 17 GM/SCOOP powder Commonly known as: GLYCOLAX/MIRALAX Take 17 g by mouth as needed.   predniSONE 20 MG tablet Commonly known as: DELTASONE Take 2 tablets (40 mg total) by mouth daily with breakfast for 3 days, THEN 1 tablet (20 mg total) daily with breakfast for 3 days. Start taking on: November 03, 2023 What changed: See the new instructions.   PRESCRIPTION MEDICATION Nebulizer solution   ProAir HFA 108 (90 Base) MCG/ACT inhaler Generic drug: albuterol Inhale 1 puff into the lungs every 6 (six) hours as needed.   rosuvastatin 20 MG tablet Commonly known as: CRESTOR Take 20 mg by mouth daily.   tiZANidine 2 MG tablet Commonly known as: ZANAFLEX   Vitamin D (Cholecalciferol) 25 MCG (1000 UT) Tabs Take 500 Units by mouth daily.        Follow-up Information     Dudley Major, FNP Follow up in 1 week(s).   Specialties: Nurse Practitioner, Family Medicine Contact information: [address] [place] [address] [phone]                Discharge Exam: Ceasar Mons Weights   10/29/23 2110  Weight: 59 kg   General exam: Appears calm and comfortable  Respiratory system: Decreased breathing sound without wheezes or crackles. Respiratory effort normal. Cardiovascular system: S1 & S2 heard, RRR. No JVD, murmurs, rubs, gallops or clicks. No pedal edema. Gastrointestinal system: Abdomen is nondistended, soft and nontender. No organomegaly or masses felt. Normal bowel sounds heard. Central nervous system: Alert and oriented. No focal neurological deficits. Extremities: Symmetric 5 x 5 power. Skin: No rashes, lesions or ulcers Psychiatry: Judgement and  insight appear normal. Mood & affect appropriate.    Condition at discharge: good  The results of significant diagnostics from this hospitalization (including imaging, microbiology, ancillary and laboratory) are listed below for reference.   Imaging Studies: DG Chest Port 1 View Result Date: 10/29/2023 CLINICAL DATA:  Short of breath and cough for 2 weeks EXAM: PORTABLE CHEST 1 VIEW COMPARISON:  11/20/2022 FINDINGS: Single frontal view of the chest demonstrates stable cardiac silhouette. Stable bronchiectasis with left upper lobe mycetoma again noted and unchanged. No new airspace disease, effusion, or pneumothorax. No acute bony abnormalities. IMPRESSION: 1. 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 No acute airspace disease. Electronically Signed   By: Sharlet Salina M.D.   On: 10/29/2023 22:08    Microbiology: Results for orders placed or performed during the hospital encounter of 10/29/23  Resp panel by RT-PCR (RSV, Flu A&B, Covid) Anterior Nasal Swab     Status: None   Collection Time: 10/29/23  9:17 PM  Specimen: Anterior Nasal Swab  Result Value Ref Range Status   SARS Coronavirus 2 by RT PCR NEGATIVE NEGATIVE Final    Comment: (NOTE) SARS-CoV-2 target nucleic acids are NOT DETECTED.  The SARS-CoV-2 RNA is generally detectable in upper respiratory specimens during the acute phase of infection. The lowest concentration of SARS-CoV-2 viral copies this assay can detect is 138 copies/mL. A negative result does not preclude SARS-Cov-2 infection and should not be used as the sole basis for treatment or other patient management decisions. A negative result may occur with  improper specimen collection/handling, submission of specimen other than nasopharyngeal swab, presence of viral mutation(s) within the areas targeted by this assay, and inadequate number of viral copies(<138 copies/mL). A negative result must be combined with clinical observations,  patient history, and epidemiological information. The expected result is Negative.  Fact Sheet for Patients:  BloggerCourse.com  Fact Sheet for Healthcare Providers:  SeriousBroker.it  This test is no t yet approved or cleared by the [place] FDA and  has been authorized for detection and/or diagnosis of SARS-CoV-2 by FDA under an Emergency Use Authorization (EUA). This EUA will remain  in effect (meaning this test can be used) for the duration of the COVID-19 declaration under Section 564(b)(1) of the Act, 21 U.S.C.section 360bbb-3(b)(1), unless the authorization is terminated  or revoked sooner.       Influenza A by PCR NEGATIVE NEGATIVE Final   Influenza B by PCR NEGATIVE NEGATIVE Final    Comment: (NOTE) The Xpert Xpress SARS-CoV-2/FLU/RSV plus assay is intended as an aid in the diagnosis of influenza from Nasopharyngeal swab specimens and should not be used as a sole basis for treatment. Nasal washings and aspirates are unacceptable for Xpert Xpress SARS-CoV-2/FLU/RSV testing.  Fact Sheet for Patients: BloggerCourse.com  Fact Sheet for Healthcare Providers: SeriousBroker.it  This test is not yet approved or cleared by the [place] FDA and has been authorized for detection and/or diagnosis of SARS-CoV-2 by FDA under an Emergency Use Authorization (EUA). This EUA will remain in effect (meaning this test can be used) for the duration of the COVID-19 declaration under Section 564(b)(1) of the Act, 21 U.S.C. section 360bbb-3(b)(1), unless the authorization is terminated or revoked.     Resp Syncytial Virus by PCR NEGATIVE NEGATIVE Final    Comment: (NOTE) Fact Sheet for Patients: BloggerCourse.com  Fact Sheet for Healthcare Providers: SeriousBroker.it  This test is not yet approved or cleared by the [place] FDA and has been authorized for detection and/or diagnosis of SARS-CoV-2 by FDA under an Emergency Use Authorization (EUA). This EUA will remain in effect (meaning this test can be used) for the duration of the COVID-19 declaration under Section 564(b)(1) of the Act, 21 U.S.C. section 360bbb-3(b)(1), unless the authorization is terminated or revoked.  Performed at [facility], [address] [place], [place] [address]     Labs: CBC: Recent Labs  Lab 10/29/23 2117 10/30/23 0513  WBC 7.9 5.1  HGB 12.7 11.2*  HCT 39.1 34.4*  MCV 78.5* 78.9*  PLT 221 202   Basic Metabolic Panel: Recent Labs  Lab 10/29/23 2117 10/30/23 0513  NA 134* 137  K 3.7 4.1  CL 98 107  CO2 22 22  GLUCOSE 139* 204*  BUN 11 13  CREATININE 0.82 0.81  CALCIUM 9.5 8.7*   Liver Function Tests: Recent Labs  Lab 10/29/23 2117  AST 19  ALT 19  ALKPHOS 102  BILITOT 0.9  PROT 7.8  ALBUMIN 4.2  CBG: No results for input(s): "GLUCAP" in the last 168 hours.  Discharge time spent: greater than 30 minutes.  Signed: Marrion Coy, MD [facility] 11/02/2023

## 2023-11-02 NOTE — TOC CM/SW Note (Signed)
 Transition of Care [facility]) - Inpatient Brief Assessment   Patient Details  Name: Jennifer [name] MRN: [id number] Date of Birth: 05-03-1971  Transition of Care [facility]) CM/SW Contact:    Chapman Fitch, RN Phone Number: 11/02/2023, 10:09 AM   Clinical Narrative:   Transition of Care (TOC) Screening Note   Patient Details  Name: Jennifer [name] Date of Birth: 12/19/1970   Transition of Care [facility]) CM/SW Contact:    Chapman Fitch, RN Phone Number: 11/02/2023, 10:09 AM    Transition of Care Department [facility]) has reviewed patient and no TOC needs have been identified at this time. . If new patient transition needs arise, please place a TOC consult.    Transition of Care Asessment: Insurance and Status: Insurance coverage has been reviewed Patient has primary care physician: Yes     Prior/Current Home Services: No current home services Social Drivers of Health Review: SDOH reviewed no interventions necessary Readmission risk has been reviewed: Yes Transition of care needs: no transition of care needs at this time

## 2023-11-08 ENCOUNTER — Ambulatory Visit: Payer: MEDICAID | Admitting: Internal Medicine

## 2023-11-08 ENCOUNTER — Emergency Department: Payer: MEDICAID

## 2023-11-08 ENCOUNTER — Emergency Department
Admission: EM | Admit: 2023-11-08 | Discharge: 2023-11-09 | Disposition: A | Discharge status: Home / Self Care | Payer: MEDICAID | Attending: Emergency Medicine | Admitting: Emergency Medicine

## 2023-11-08 ENCOUNTER — Encounter: Payer: Self-pay | Admitting: Internal Medicine

## 2023-11-08 ENCOUNTER — Encounter: Payer: Self-pay | Admitting: Radiology

## 2023-11-08 ENCOUNTER — Other Ambulatory Visit: Payer: Self-pay

## 2023-11-08 VITALS — BP 120/70 | HR 93 | Temp 97.6°F | Ht <= 58 in | Wt 145.0 lb

## 2023-11-08 DIAGNOSIS — R918 Other nonspecific abnormal finding of lung field: Secondary | ICD-10-CM | POA: Diagnosis not present

## 2023-11-08 DIAGNOSIS — R4781 Slurred speech: Secondary | ICD-10-CM

## 2023-11-08 DIAGNOSIS — R7309 Other abnormal glucose: Secondary | ICD-10-CM | POA: Diagnosis not present

## 2023-11-08 DIAGNOSIS — G2402 Drug induced acute dystonia: Secondary | ICD-10-CM | POA: Insufficient documentation

## 2023-11-08 DIAGNOSIS — I10 Essential (primary) hypertension: Secondary | ICD-10-CM | POA: Insufficient documentation

## 2023-11-08 DIAGNOSIS — J4520 Mild intermittent asthma, uncomplicated: Secondary | ICD-10-CM

## 2023-11-08 DIAGNOSIS — J45909 Unspecified asthma, uncomplicated: Secondary | ICD-10-CM | POA: Diagnosis not present

## 2023-11-08 DIAGNOSIS — R531 Weakness: Secondary | ICD-10-CM

## 2023-11-08 DIAGNOSIS — D869 Sarcoidosis, unspecified: Secondary | ICD-10-CM

## 2023-11-08 DIAGNOSIS — R4182 Altered mental status, unspecified: Secondary | ICD-10-CM | POA: Diagnosis present

## 2023-11-08 DIAGNOSIS — J209 Acute bronchitis, unspecified: Secondary | ICD-10-CM | POA: Diagnosis not present

## 2023-11-08 LAB — COMPREHENSIVE METABOLIC PANEL
ALT: 34 U/L (ref 0–44)
AST: 20 U/L (ref 15–41)
Albumin: 3.9 g/dL (ref 3.5–5.0)
Alkaline Phosphatase: 98 U/L (ref 38–126)
Anion gap: 12 (ref 5–15)
BUN: 15 mg/dL (ref 6–20)
CO2: 23 mmol/L (ref 22–32)
Calcium: 9.5 mg/dL (ref 8.9–10.3)
Chloride: 100 mmol/L (ref 98–111)
Creatinine, Ser: 0.99 mg/dL (ref 0.44–1.00)
GFR, Estimated: >60 mL/min (ref >60)
Glucose, Bld: 222 mg/dL — ABNORMAL HIGH (ref 70–99)
Potassium: 4.9 mmol/L (ref 3.5–5.1)
Sodium: 135 mmol/L (ref 135–145)
Total Bilirubin: 0.5 mg/dL (ref 0.0–1.2)
Total Protein: 7.8 g/dL (ref 6.5–8.1)

## 2023-11-08 LAB — CBC
HCT: 47.2 % — ABNORMAL HIGH (ref 36.0–46.0)
Hemoglobin: 15.3 g/dL — ABNORMAL HIGH (ref 12.0–15.0)
MCH: 25.9 pg — ABNORMAL LOW (ref 26.0–34.0)
MCHC: 32.4 g/dL (ref 30.0–36.0)
MCV: 80 fL (ref 80.0–100.0)
Platelets: 329 10*3/uL (ref 150–400)
RBC: 5.9 MIL/uL — ABNORMAL HIGH (ref 3.87–5.11)
RDW: 13.1 % (ref 11.5–15.5)
WBC: 12.4 10*3/uL — ABNORMAL HIGH (ref 4.0–10.5)
nRBC: 0 % (ref 0.0–0.2)

## 2023-11-08 LAB — APTT: aPTT: 26 s (ref 24–36)

## 2023-11-08 LAB — DIFFERENTIAL
Abs Immature Granulocytes: 0.08 10*3/uL — ABNORMAL HIGH (ref 0.00–0.07)
Basophils Absolute: 0 10*3/uL (ref 0.0–0.1)
Basophils Relative: 0 %
Eosinophils Absolute: 0 10*3/uL (ref 0.0–0.5)
Eosinophils Relative: 0 %
Immature Granulocytes: 1 %
Lymphocytes Relative: 13 %
Lymphs Abs: 1.6 10*3/uL (ref 0.7–4.0)
Monocytes Absolute: 0.4 10*3/uL (ref 0.1–1.0)
Monocytes Relative: 3 %
Neutro Abs: 10.3 10*3/uL — ABNORMAL HIGH (ref 1.7–7.7)
Neutrophils Relative %: 83 %

## 2023-11-08 LAB — URINE DRUG SCREEN, QUALITATIVE (ARMC ONLY)
Amphetamines, Ur Screen: NOT DETECTED
Barbiturates, Ur Screen: NOT DETECTED
Benzodiazepine, Ur Scrn: NOT DETECTED
Cannabinoid 50 Ng, Ur ~~LOC~~: NOT DETECTED
Cocaine Metabolite,Ur ~~LOC~~: NOT DETECTED
MDMA (Ecstasy)Ur Screen: NOT DETECTED
Methadone Scn, Ur: NOT DETECTED
Opiate, Ur Screen: NOT DETECTED
Phencyclidine (PCP) Ur S: NOT DETECTED
Tricyclic, Ur Screen: NOT DETECTED

## 2023-11-08 LAB — URINALYSIS, W/ REFLEX TO CULTURE (INFECTION SUSPECTED)
Bacteria, UA: NONE SEEN
Bilirubin Urine: NEGATIVE
Glucose, UA: 150 mg/dL — AB
Hgb urine dipstick: NEGATIVE
Ketones, ur: NEGATIVE mg/dL
Leukocytes,Ua: NEGATIVE
Nitrite: NEGATIVE
Protein, ur: NEGATIVE mg/dL
Specific Gravity, Urine: 1.031 — ABNORMAL HIGH (ref 1.005–1.030)
pH: 8 (ref 5.0–8.0)

## 2023-11-08 LAB — MAGNESIUM: Magnesium: 2.3 mg/dL (ref 1.7–2.4)

## 2023-11-08 LAB — SALICYLATE LEVEL: Salicylate Lvl: <7 mg/dL — ABNORMAL LOW (ref 7.0–30.0)

## 2023-11-08 LAB — ETHANOL: Alcohol, Ethyl (B): <10 mg/dL (ref <10)

## 2023-11-08 LAB — ACETAMINOPHEN LEVEL: Acetaminophen (Tylenol), Serum: <10 ug/mL — ABNORMAL LOW (ref 10–30)

## 2023-11-08 LAB — CBG MONITORING, ED: Glucose-Capillary: 199 mg/dL — ABNORMAL HIGH (ref 70–99)

## 2023-11-08 LAB — PROTIME-INR
INR: 0.9 (ref 0.8–1.2)
Prothrombin Time: 12.6 s (ref 11.4–15.2)

## 2023-11-08 MED ORDER — ALBUTEROL SULFATE (2.5 MG/3ML) 0.083% IN NEBU: 2.5000 mg | INHALATION_SOLUTION | RESPIRATORY_TRACT | 2 refills | Status: DC | PRN

## 2023-11-08 MED ORDER — BENZTROPINE MESYLATE 1 MG/ML IJ SOLN
1.0000 mg | Freq: Once | INTRAMUSCULAR | Status: AC
  Administered 2023-11-08: 1 mg via INTRAVENOUS
  Filled 2023-11-08: qty 1

## 2023-11-08 MED ORDER — SODIUM CHLORIDE 0.9% FLUSH: 3.0000 mL | Freq: Once | INTRAVENOUS | Status: DC

## 2023-11-08 MED ORDER — PREDNISONE 20 MG PO TABS
20.0000 mg | ORAL_TABLET | Freq: Every day | ORAL | 1 refills | Status: DC
Start: 2023-11-08 — End: 2024-07-10

## 2023-11-08 MED ORDER — IOHEXOL 350 MG/ML SOLN
75.0000 mL | Freq: Once | INTRAVENOUS | Status: AC | PRN
  Administered 2023-11-08: 75 mL via INTRAVENOUS

## 2023-11-08 MED ORDER — SODIUM CHLORIDE 0.9 % IV BOLUS
500.0000 mL | Freq: Once | INTRAVENOUS | Status: AC
  Administered 2023-11-08: 500 mL via INTRAVENOUS

## 2023-11-08 NOTE — ED Notes (Signed)
 Pt ambulated to the toilet in the room without any difficulty

## 2023-11-08 NOTE — ED Notes (Signed)
 Pt was brought to CT by triage RN, eval by tele neurologist and EDP.

## 2023-11-08 NOTE — ED Notes (Signed)
 Call to begin tele monitoring.

## 2023-11-08 NOTE — Progress Notes (Signed)
 Telestroke Note    1826: Code stroke cart activated by nursing. Per nursing LKW 1430. Per nursing, family was concerned for aphasia, slurred speech, facial droop, left leg weakness, and confusion. mRS 2. Patient does not take blood thinners or ASA. Patient already in CT at time of cart activation.   1827: Dr.Arora paged.   1829: Dr.Arora on camera. Patient history and report provided to Dr.Arora on camera.   1830: Dr.Arora performing neuro evaluation.   1836: Advanced imaging to be obtained per Dr.Arora.   1840: CTA imaging completed. No further needs from telestroke nurse per Dr.Arora. Logged off telestroke cart at this time.    Derrill Kay Telestroke RN

## 2023-11-08 NOTE — ED Provider Triage Note (Signed)
 Emergency Medicine Provider Triage Evaluation Note  RANDEE HUSTON , a 53 y.o. female  was evaluated in triage.  Pt complains of difficulty speaking, weak.  Review of Systems  Positive:  Negative:   Physical Exam  BP 137/87 (BP Location: Right Arm)   Pulse 92   Temp 98.5 F (36.9 C) (Oral)   Resp 17   LMP 09/30/2020  Gen:   Awake, no distress   Resp:  Normal effort  MSK:   Moves extremities without difficulty  Other:    Medical Decision Making  Medically screening exam initiated at 6:23 PM.  Appropriate orders placed.  SHANTY GINTY was informed that the remainder of the evaluation will be completed by another provider, this initial triage assessment does not replace that evaluation, and the importance of remaining in the ED until their evaluation is complete.  Stroke code activated.   Gladys Damme, PA-C 11/08/23 [phone]

## 2023-11-08 NOTE — ED Triage Notes (Signed)
 Pt LKW at 1430. Family denies taking any blood thinners. Pt is weak on the left leg. Pt is having slurred speech.

## 2023-11-08 NOTE — Consult Note (Signed)
 [facility] Telemedicine Consult   Requesting Provider: Dr. Augustin Schooling Consult Participants: Dr. Marthe Patch, Telespecialist RN Glenda   bedside RN-Ellen Location of the provider: Home Location of the patient: [facility] ED bed 16  This consult was provided via telemedicine with 2-way video and audio communication. The patient/family was informed that care would be provided in this way and agreed to receive care in this manner.   Chief Complaint: Weakness, facial droop, slurred speech  HPI: 53 year old with a past medical history of anxiety and depression, fibromyalgia, hypothyroidism, sarcoidosis, COPD and asthma with most recent hospitalization discharged on 11/02/2023 for asthma exacerbation and sarcoidosis flare coming back with complaints of weakness and slurred speech. Last known well was somewhere around 2:30 PM and around 2:45 PM, family noted that she was having trouble with her speech.  Her speech was slurred.  She was also reported by family to have some left arm weakness but in the ED, she was noted to have some more left leg weakness.  They also said that there was right-sided facial droop and some confusion. On my examination, she was able to answer questions appropriately although she was slow to answer.  See my detailed exam below.  Later family member comes in who thinks that she might of taken excessive medication Latuda.  Unclear how many pills.    Past Medical History:  Diagnosis Date   Anxiety    Asthma    Chest pain    a. 08/2018 MV: EF 62%, no ischemia/scar.   Depression    Diastolic dysfunction    a. 09/2017 Echo: Nl LV size/fxn/wall thickness; b. 07/2020 Echo: EF 60-65%, no rwma, Gr1 DD. Nl RV size/fxn. Mild MR.   Fibromyalgia    Hyperthyroidism    Incontinence    Lymphadenopathy    Pneumonia    bilateral community acquired    PVC's (premature ventricular contractions)    a. 06/2016 24h Holter: Sinus rhythm, freq PVCs (13% burden), rare PACs; b. 03/2017 24h  Holter: Predominantly sinus rhythm. Freq PVCs (6% burden); c. 08/2020 cMRI: EF 50%. Nl LV size/thickness. No LGE to suggest scar/infiltration. Nl RV size/fxn. Nl biatrial sizes. No significant valvular abnormalities.   Sarcoidosis    Sepsis (HCC)    Shortness of breath dyspnea    Tachycardia      Current Facility-Administered Medications:    sodium chloride flush (NS) 0.9 % injection 3 mL, 3 mL, Intravenous, Once, Mumma, Shannon, MD  Current Outpatient Medications:    acetaminophen (TYLENOL) 500 MG tablet, Take 500-1,000 mg by mouth every 6 (six) hours as needed for mild pain or fever. , Disp: , Rfl:    albuterol (PROVENTIL) (2.5 MG/3ML) 0.083% nebulizer solution, Take 3 mLs (2.5 mg total) by nebulization every 4 (four) hours as needed for wheezing or shortness of breath., Disp: 75 mL, Rfl: 2   bisoprolol (ZEBETA) 10 MG tablet, Take 2 tablets (20 mg total) by mouth daily., Disp: 60 tablet, Rfl: 5   divalproex (DEPAKOTE ER) 500 MG 24 hr tablet, Take 500 mg by mouth daily. , Disp: , Rfl:    fluticasone-salmeterol (ADVAIR DISKUS) 250-50 MCG/ACT AEPB, Inhale 1 puff into the lungs 2 (two) times daily., Disp: 60 each, Rfl: 1   ibuprofen (ADVIL) 200 MG tablet, Take 400-600 mg by mouth every 6 (six) hours as needed for fever or mild pain. , Disp: , Rfl:    meloxicam (MOBIC) 7.5 MG tablet, Take One tab PO BID with food, Disp: , Rfl:    methimazole (TAPAZOLE)  10 MG tablet, Take 10 mg by mouth daily., Disp: , Rfl:    omeprazole (PRILOSEC) 40 MG capsule, Take 1 capsule (40 mg total) by mouth in the morning and at bedtime., Disp: 180 capsule, Rfl: 0   ondansetron (ZOFRAN) 4 MG tablet, Take 1 tablet (4 mg total) by mouth every 6 (six) hours as needed for nausea or vomiting., Disp: 20 tablet, Rfl: 1   polyethylene glycol powder (GLYCOLAX/MIRALAX) 17 GM/SCOOP powder, Take 17 g by mouth as needed., Disp: 255 g, Rfl: 0   predniSONE (DELTASONE) 20 MG tablet, Take 2 tablets (40 mg total) by mouth daily with  breakfast for 3 days, THEN 1 tablet (20 mg total) daily with breakfast for 3 days., Disp: 9 tablet, Rfl: 0   predniSONE (DELTASONE) 20 MG tablet, Take 1 tablet (20 mg total) by mouth daily with breakfast. 7 days, Disp: 7 tablet, Rfl: 1   PRESCRIPTION MEDICATION, Nebulizer solution, Disp: , Rfl:    PROAIR HFA 108 (90 Base) MCG/ACT inhaler, Inhale 1 puff into the lungs every 6 (six) hours as needed., Disp: 18 g, Rfl: 5   rosuvastatin (CRESTOR) 20 MG tablet, Take 20 mg by mouth daily., Disp: , Rfl:    tiZANidine (ZANAFLEX) 2 MG tablet, , Disp: , Rfl:    Vitamin D, Cholecalciferol, 25 MCG (1000 UT) TABS, Take 500 Units by mouth daily., Disp: 60 tablet, Rfl:     LKW: 2:30 PM IV thrombolysis given?: No, too mild to treat IR Thrombectomy? No, exam not consistent with LVO, imaging also negative for LVO Modified Rankin Scale: 0-Completely asymptomatic and back to baseline post- stroke Time of teleneurologist evaluation: 1829 hrs.  Exam: Vitals:   11/08/23 1821  BP: 137/87  Pulse: 92  Resp: 17  Temp: 98.5 F (36.9 C)    General: Awake alert in no distress Neurological exam She is awake alert oriented x 3.  She is slow to respond to questions.  There is no dysarthria.  No evidence of aphasia. Cranial nerves II to XII appear intact-I did not specifically appreciate any facial asymmetry on the camera while at rest and when she smiled. Motor examination showed symmetric drift in bilateral upper extremities which appeared more volitional/functional.  No asymmetry in the lower extremities as well-some symmetric appearing drift there as well. Sensation intact Coordination examination difficult to perform but no gross dysmetria   NIHSS 1A: Level of Consciousness - 0 1B: Ask Month and Age - 0 1C: 'Blink Eyes' & 'Squeeze Hands' - 0 2: Test Horizontal Extraocular Movements - 0 3: Test Visual Fields - 0 4: Test Facial Palsy - 0 5A: Test Left Arm Motor Drift - 1 5B: Test Right Arm Motor Drift -  1 6A: Test Left Leg Motor Drift - 1 6B: Test Right Leg Motor Drift - 1 7: Test Limb Ataxia - 0 8: Test Sensation - 0 9: Test Language/Aphasia- 0 10: Test Dysarthria - 0 11: Test Extinction/Inattention - 0 NIHSS score: 4   Imaging Reviewed: Noncontrasted head CT with no acute changes CT angiography head and neck-no ELVO on my review. Radiology read seconds it.  Labs reviewed in epic and pertinent values follow: CBC    Component Value Date/Time   WBC 5.1 10/30/2023 0513   RBC 4.36 10/30/2023 0513   HGB 11.2 (L) 10/30/2023 0513   HGB 13.0 06/16/2018 1118   HCT 34.4 (L) 10/30/2023 0513   HCT 39.8 06/16/2018 1118   PLT 202 10/30/2023 0513   PLT 284 06/16/2018 1118  MCV 78.9 (L) 10/30/2023 0513   MCV 73 (L) 06/16/2018 1118   MCV 77 (L) 11/28/2014 0715   MCH 25.7 (L) 10/30/2023 0513   MCHC 32.6 10/30/2023 0513   RDW 13.5 10/30/2023 0513   RDW 13.3 06/16/2018 1118   RDW 13.4 11/28/2014 0715   LYMPHSABS 2.2 08/20/2020 1035   LYMPHSABS 0.8 06/16/2018 1118   LYMPHSABS 0.6 (L) 11/28/2014 0715   MONOABS 0.8 08/20/2020 1035   MONOABS 0.7 11/28/2014 0715   EOSABS 0.1 08/20/2020 1035   EOSABS 0.1 06/16/2018 1118   EOSABS 0.1 11/28/2014 0715   BASOSABS 0.0 08/20/2020 1035   BASOSABS 0.0 06/16/2018 1118   BASOSABS 0.0 11/28/2014 0715   CMP     Component Value Date/Time   NA 137 10/30/2023 0513   NA 137 06/16/2018 1118   NA 139 11/28/2014 0715   K 4.1 10/30/2023 0513   K 4.0 11/28/2014 0715   CL 107 10/30/2023 0513   CL 104 11/28/2014 0715   CO2 22 10/30/2023 0513   CO2 28 11/28/2014 0715   GLUCOSE 204 (H) 10/30/2023 0513   GLUCOSE 102 (H) 11/28/2014 0715   BUN 13 10/30/2023 0513   BUN 5 (L) 06/16/2018 1118   BUN < 5 (L) 11/28/2014 0715   CREATININE 0.81 10/30/2023 0513   CREATININE 0.67 11/28/2014 0715   CALCIUM 8.7 (L) 10/30/2023 0513   CALCIUM 8.7 (L) 11/28/2014 0715   PROT 7.8 10/29/2023 2117   ALBUMIN 4.2 10/29/2023 2117   AST 19 10/29/2023 2117   ALT 19  10/29/2023 2117   ALKPHOS 102 10/29/2023 2117   BILITOT 0.9 10/29/2023 2117   GFRNONAA >60 10/30/2023 0513   GFRNONAA >60 11/28/2014 0715   GFRAA >60 07/14/2018 1057   GFRAA >60 11/28/2014 0715  Today's labs are pending at the time of this dictation   Assessment: 53 year old woman with above past medical history presenting for evaluation of slurred speech and weakness.  Initially reported to be left-sided body weakness and right-sided facial droop, on my examination did not really have a very localizable exam. Appears somewhat encephaloppathic on exam. Family member later provided history of possible mistakenly taken an extra dose or 2 of Latuda which can definitely cause somnolence.  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 Check UA, chest x-ray. Also check UDS If she is able to metabolize her medications and is back to baseline a few hours and the MRI is negative, she can be discharged home from a neurological standpoint. Plan discussed with Dr. Arnoldo Morale   -- Milon Dikes, MD Neurologist [facility] Pager: [phone]

## 2023-11-08 NOTE — ED Provider Notes (Signed)
 [facility] Provider Note    Event Date/Time   First MD Initiated Contact with Patient 11/08/23 1828     (approximate)   History   Aphasia   HPI  Jennifer [name] is a 53 y.o. female past medical history significant for asthma, hypertension, hyperlipidemia, sarcoidosis, anxiety, presents to the emergency department with trouble with her speech and altered state.  When patient checked into triage was an activated code stroke and immediately taken back to CT scanner.  Patient's family member at bedside states that she saw her earlier today and she was in her normal state of health and talking normally.  States that she checked on her since she had recently been in the hospital.  She then saw her at 230 and had significant change of state.  Had slurring of her speech, was weak and not acting her normal self.  There was a question of whether the patient restarted her Latuda/took extra doses of Latuda.  They noted that she was having significant change in deviation of her jaw.     Physical Exam   Triage Vital Signs: ED Triage Vitals  Encounter Vitals Group     BP 11/08/23 1821 137/87     Systolic BP Percentile --      Diastolic BP Percentile --      Pulse Rate 11/08/23 1821 92     Resp 11/08/23 1821 17     Temp 11/08/23 1821 98.5 F (36.9 C)     Temp Source 11/08/23 1821 Oral     SpO2 --      Weight 11/08/23 1823 145 lb (65.8 kg)     Height 11/08/23 1823 4\' 9"  (1.448 m)     Head Circumference --      Peak Flow --      Pain Score 11/08/23 1822 0     Pain Loc --      Pain Education --      Exclude from Growth Chart --     Most recent vital signs: Vitals:   11/08/23 2030 11/08/23 2149  BP: (!) 147/89 (!) 156/79  Pulse: 89 95  Resp: 18 19  Temp:  98.4 F (36.9 C)  SpO2: 96% 96%    Physical Exam Constitutional:      Appearance: She is well-developed.  HENT:     Head: Atraumatic.  Eyes:     Conjunctiva/sclera: Conjunctivae normal.   Cardiovascular:     Rate and Rhythm: Regular rhythm.  Pulmonary:     Effort: No respiratory distress.  Abdominal:     General: There is no distension.  Musculoskeletal:        General: Normal range of motion.     Cervical back: Normal range of motion and neck supple.     Right lower leg: No edema.     Left lower leg: No edema.  Skin:    General: Skin is warm.     Capillary Refill: Capillary refill takes less than 2 seconds.  Neurological:     Mental Status: She is alert. Mental status is at baseline.     GCS: GCS eye subscore is 4. GCS verbal subscore is 5. GCS motor subscore is 6.     Cranial Nerves: Cranial nerves 2-12 are intact.     Sensory: Sensation is intact.     Motor: Motor function is intact.  Psychiatric:        Mood and Affect: Mood normal.     IMPRESSION / MDM /  ASSESSMENT AND PLAN / ED COURSE  I reviewed the triage vital signs and the nursing notes.  Differential diagnosis including stroke, intracranial hemorrhage, dystonic reaction secondary to incorrect doses of Latuda, electrolyte abnormality, urinary tract infection EKG  I, Corena Herter, the attending physician, personally viewed and interpreted this ECG.  EKG showed sinus rhythm.  Normal intervals.  Questionably atrial enlargement given her large P waves.  Multiple PVCs.  No tachycardic or bradycardic dysrhythmias while on cardiac telemetry.  RADIOLOGY I independently reviewed imaging, my interpretation of imaging: CT scan of the head without signs of intracranial hemorrhage  CTA head and neck read as no acute findings  MRI pending  LABS (all labs ordered are listed, but only abnormal results are displayed) Labs interpreted as -    Labs Reviewed  CBC - Abnormal; Notable for the following components:      Result Value   WBC 12.4 (*)    RBC 5.90 (*)    Hemoglobin 15.3 (*)    HCT 47.2 (*)    MCH 25.9 (*)    All other components within normal limits  DIFFERENTIAL - Abnormal; Notable for the  following components:   Neutro Abs 10.3 (*)    Abs Immature Granulocytes 0.08 (*)    All other components within normal limits  COMPREHENSIVE METABOLIC PANEL - Abnormal; Notable for the following components:   Glucose, Bld 222 (*)    All other components within normal limits  ACETAMINOPHEN LEVEL - Abnormal; Notable for the following components:   Acetaminophen (Tylenol), Serum <10 (*)    All other components within normal limits  SALICYLATE LEVEL - Abnormal; Notable for the following components:   Salicylate Lvl <7.0 (*)    All other components within normal limits  URINALYSIS, W/ REFLEX TO CULTURE (INFECTION SUSPECTED) - Abnormal; Notable for the following components:   Color, Urine COLORLESS (*)    APPearance CLEAR (*)    Specific Gravity, Urine 1.031 (*)    Glucose, UA 150 (*)    All other components within normal limits  CBG MONITORING, ED - Abnormal; Notable for the following components:   Glucose-Capillary 199 (*)    All other components within normal limits  PROTIME-INR  APTT  ETHANOL  MAGNESIUM  URINE DRUG SCREEN, QUALITATIVE ([facility] ONLY)     MDM  On arrival patient was taken back as an activated code stroke for TNK consideration.  After evaluation by neurologist recommended not giving TNK.  No large vessel occlusion.  Did recommend MRI.  Was concern for medication side effect and if symptoms improved could discharge home if MRI was negative otherwise would be admitted.  On my exam patient does not have any symptoms concerning for serotonin syndrome.  No obvious symptoms of NMS.  Initially patient had improvement however on reevaluation started having difficulty moving her jaw and having findings concerning for dystonic reaction likely secondary to her Latuda.  Given a dose of IV benztropine.  MRI is currently pending.  Care transferred to incoming provider.     PROCEDURES:  Critical Care performed: yes  .Critical Care  Performed by: Corena Herter, MD Authorized  by: Corena Herter, MD   Critical care provider statement:    Critical care time (minutes):  35   Critical care time was exclusive of:  Separately billable procedures and treating other patients   Critical care was necessary to treat or prevent imminent or life-threatening deterioration of the following conditions:  CNS failure or compromise   Critical care was time  spent personally by me on the following activities:  Development of treatment plan with patient or surrogate, discussions with consultants, evaluation of patient's response to treatment, examination of patient, ordering and review of laboratory studies, ordering and review of radiographic studies, ordering and performing treatments and interventions, pulse oximetry, re-evaluation of patient's condition and review of old charts   Patient's presentation is most consistent with acute presentation with potential threat to life or bodily function.   MEDICATIONS ORDERED IN ED: Medications  sodium chloride flush (NS) 0.9 % injection 3 mL (3 mLs Intravenous Not Given 11/08/23 2143)  iohexol (OMNIPAQUE) 350 MG/ML injection 75 mL (75 mLs Intravenous Contrast Given 11/08/23 1837)  sodium chloride 0.9 % bolus 500 mL (0 mLs Intravenous Stopped 11/08/23 2349)  benztropine mesylate (COGENTIN) injection 1 mg (1 mg Intravenous Given 11/08/23 2346)    FINAL CLINICAL IMPRESSION(S) / ED DIAGNOSES   Final diagnoses:  Altered mental status, unspecified altered mental status type  Dystonic drug reaction     Rx / DC Orders   ED Discharge Orders     None        Note:  This document was prepared using Dragon voice recognition software and may include unintentional dictation errors.   Corena Herter, MD 11/08/23 [phone]

## 2023-11-08 NOTE — ED Notes (Signed)
 Call to lab to add Acetaminophen and Salicylate levels

## 2023-11-08 NOTE — Progress Notes (Signed)
 [facility] Pulmonary Medicine Consultation      MRN# [id number] GEM CONKLE August 24, 1971 Brief History: Synopsis: 53 year old female with left upper lobe cavitary lesion, history of bilateral pneumonia, currently being followed by pulmonary and infectious disease.  Cavitary lesion differential at this time includes fungal infection versus sarcoidosis.  treated as fungal infection by infectious disease and with steroids by pulmonary for suspected Sarcoid.  Diagnosis of aspergilloma Previous 6-minute walk and ONO within normal limits  Has intermittent fatigue at times She does not want to taper steroids at this time Robitussin with codeine helps her cough  CT chest August 03, 2020  Independently reviewed and also reviewed with patient Chronic bilateral cylindrical bronchiectasis Persistent cavitary lesion in the left upper lobe containing intramural soft tissue mass. The area of cavitation is similar to 2017 study with slight enlargement of the intramural soft tissue mass.   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   Patient also has extreme back pain which is unrelated to her lung disease PET scan low level activity  CT chest March 2024 No significant change in the left upper lobe cavitary lesion      CC: Follow-up assessment shortness of breath Follow-up assessment for abnormal CT chest  HPI Patient with acute viral bronchitis at this time Patient with nausea vomiting Patient having a hard time feeling better with fevers Patient claims she was tested for flu RSV and COVID which were all negative At this time patient has a viral bronchitis with gastroenteritis   Patient is having increased shortness of breath Patient advised to start Advair at previous office visit Increased dyspnea on exertion +cough + wheezing + fevers  I recommend using over-the-counter medications with DayQuil and  NyQuil Patient currently on prednisone and antibiotic therapy Will continue with these medications  I recommend that patient go to the ER if symptoms get worse    Medication:    Current Outpatient Medications:    acetaminophen (TYLENOL) 500 MG tablet, Take 500-1,000 mg by mouth every 6 (six) hours as needed for mild pain or fever. , Disp: , Rfl:    bisoprolol (ZEBETA) 10 MG tablet, Take 2 tablets (20 mg total) by mouth daily., Disp: 60 tablet, Rfl: 5   divalproex (DEPAKOTE ER) 500 MG 24 hr tablet, Take 500 mg by mouth daily. , Disp: , Rfl:    fluticasone-salmeterol (ADVAIR DISKUS) 250-50 MCG/ACT AEPB, Inhale 1 puff into the lungs 2 (two) times daily., Disp: 60 each, Rfl: 1   ibuprofen (ADVIL) 200 MG tablet, Take 400-600 mg by mouth every 6 (six) hours as needed for fever or mild pain. , Disp: , Rfl:    meloxicam (MOBIC) 7.5 MG tablet, Take One tab PO BID with food, Disp: , Rfl:    methimazole (TAPAZOLE) 10 MG tablet, Take 10 mg by mouth daily., Disp: , Rfl:    omeprazole (PRILOSEC) 40 MG capsule, Take 1 capsule (40 mg total) by mouth in the morning and at bedtime., Disp: 180 capsule, Rfl: 0   ondansetron (ZOFRAN) 4 MG tablet, Take 1 tablet (4 mg total) by mouth every 6 (six) hours as needed for nausea or vomiting., Disp: 20 tablet, Rfl: 1   polyethylene glycol powder (GLYCOLAX/MIRALAX) 17 GM/SCOOP powder, Take 17 g by mouth as needed., Disp: 255 g, Rfl: 0   predniSONE (DELTASONE) 20 MG tablet, Take 2 tablets (40 mg total) by mouth daily with breakfast for 3 days, THEN 1 tablet (20 mg total)  daily with breakfast for 3 days., Disp: 9 tablet, Rfl: 0   PRESCRIPTION MEDICATION, Nebulizer solution, Disp: , Rfl:    PROAIR HFA 108 (90 Base) MCG/ACT inhaler, Inhale 1 puff into the lungs every 6 (six) hours as needed., Disp: 18 g, Rfl: 5   rosuvastatin (CRESTOR) 20 MG tablet, Take 20 mg by mouth daily., Disp: , Rfl:    tiZANidine (ZANAFLEX) 2 MG tablet, , Disp: , Rfl:    Vitamin D, Cholecalciferol,  25 MCG (1000 UT) TABS, Take 500 Units by mouth daily., Disp: 60 tablet, Rfl:    BP 120/70 (BP Location: Right Arm, Patient Position: Sitting, Cuff Size: Normal)   Pulse 93   Temp 97.6 F (36.4 C) (Temporal)   Ht 4\' 9"  (1.448 m)   Wt 145 lb (65.8 kg)   LMP 09/30/2020   SpO2 99%   BMI 31.38 kg/m     Review of Systems: Gen:  +  fever, +sweats,  HEENT: Denies blurred vision, double vision, ear pain, eye pain, hearing loss, nose bleeds, sore throat Cardiac:  No dizziness, chest pain or heaviness, chest tightness,edema, No JVD Resp:   +cough, +sputum production, +shortness of breath,+wheezing, -hemoptysis,  Other:  All other systems negative   Physical Examination:   General Appearance: No distress  EYES PERRLA, EOM intact.   NECK Supple, No JVD Pulmonary: normal breath sounds, No wheezing.  CardiovascularNormal S1,S2.  No m/r/g.   Abdomen: Benign, Soft, non-tender. 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 presenting with follow-up visit with history of aspergilloma with signs and symptoms of increasing shortness of breath and dyspnea on exertion, findings are consistent with acute viral bronchitis with acute gastroenteritis  Acute viral bronchitis and gastroenteritis Patient will continue prednisone as prescribed Patient is to continue her antibiotics as prescribed Recommend over-the-counter medications for cough suppressant and Tylenol (DayQuil and NyQuil as directed) Continue inhalers as prescribed Recommend nebulized therapy as prescribed  Avoid Allergens and  Irritants Avoid secondhand smoke Avoid SICK contacts Recommend  Masking  when appropriate Recommend Keep up-to-date with vaccinations  Abnormal CT chest Previous history of aspergilloma Previously had voriconazole therapy early in 2016 but developed a fungus ball on CT chest ID has taken her off voriconazole 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 With left upper lobe pulmonary cavitary lesion with soft intramural tissue mass Since there are no significant CT scan changes over the last couple of years there is no indication for PET scan at this time we will follow along very closely    MEDICATION ADJUSTMENTS/LABS AND TESTS ORDERED: Prednisone 20 mg daily for 7 days Continue the antibiotic that you have as prescribed Recommend DayQuil over-the-counter medicine for cough and congestion use as directed Recommend NyQuil over-the-counter medicine for cough and congestion use as directed Avoid Allergens and Irritants Avoid secondhand smoke Avoid SICK contacts Recommend  Masking  when appropriate Recommend Keep up-to-date with vaccinations   CURRENT MEDICATIONS REVIEWED AT LENGTH WITH PATIENT TODAY   Patient  satisfied with Plan of action and management. All questions answered   Follow up 6 months   I spent a total of 43 minutes reviewing chart data, face-to-face evaluation  with the patient, counseling and coordination of care as detailed above.      [name], M.D.  [facility]  Medical Director [facility] Medical Director [facility] Cardio-Pulmonary Department

## 2023-11-08 NOTE — ED Notes (Signed)
 Pt ambulates to the bathroom to void. Family remains at the bedside

## 2023-11-08 NOTE — Patient Instructions (Addendum)
 Prednisone 20 mg daily for 7 days Continue the antibiotic that you have as prescribed  Recommend DayQuil over-the-counter medicine for cough and congestion use as directed Recommend NyQuil over-the-counter medicine for cough and congestion use as directed  Avoid Allergens and Irritants Avoid secondhand smoke Avoid SICK contacts Recommend  Masking  when appropriate Recommend Keep up-to-date with vaccinations  Continue inhalers as prescribed Start nebulized therapy will provide albuterol nebs

## 2023-11-08 NOTE — ED Notes (Signed)
 [facility] called for codestroke @ 6:24 per RN Lelon Mast , spoke with Selena Batten

## 2023-11-08 NOTE — ED Notes (Signed)
 Pt tells [place] that she accidentally took Latuda 40mg  around 2pm

## 2023-11-08 NOTE — ED Notes (Signed)
Pt has been taken to MRI  

## 2023-11-09 NOTE — ED Provider Notes (Signed)
 Procedures     ----------------------------------------- 2:29 AM on 11/09/2023 ----------------------------------------- Sx resolved. Feels well and at baseline. Imaging negative. Stable for dc     Sharman Cheek, MD 11/09/23 [phone]

## 2023-11-09 NOTE — Discharge Instructions (Signed)
 Your scans today were all okay. Please follow up with your doctor for further evaluation of your symptoms today.

## 2023-11-11 ENCOUNTER — Ambulatory Visit: Payer: 59 | Admitting: Internal Medicine

## 2023-11-13 DIAGNOSIS — R4182 Altered mental status, unspecified: Secondary | ICD-10-CM

## 2023-12-22 ENCOUNTER — Ambulatory Visit: Payer: MEDICAID | Admitting: Internal Medicine

## 2023-12-22 NOTE — Progress Notes (Signed)
 Cardiology Clinic Note   Date: 12/24/2023 ID: VEVA GRIMLEY, DOB 1970-10-26, MRN [id number]  Primary Cardiologist:  Lonni Hanson, MD  Chief Complaint   Jennifer [name] is a 53 y.o. female who presents to the clinic today for overdue follow up.   Patient Profile   Jennifer [name] is followed by Dr. Hanson for the history outlined below.      Past medical history significant for: Palpitations/PVCs. Holter monitor 06/29/2016: HR 73 to 141 bpm, average 87 bpm.  103 isolated PACs.  Ventricular ectopy comprised 13% of total number of beats: 16,494 isolated PVCs, 51 ventricular bigeminal cycles.  No evidence of A-fib. 24-hour Holter 03/26/2017: HR 67 to 124 bpm, average 89 bpm.  Predominantly sinus rhythm.  Rare PACs.  Frequent isolated PVCs 6% burden.  Rare ventricular couplets.  No sustained arrhythmia or prolonged pauses was identified. Echo 08/04/2020: EF 60 to 65%.  No RWMA.  Grade I DD.  Normal RV size/function.  Mild MR. Cardiac MRI 09/05/2020: Normal LV size/function.  Normal RV size/function.  No significant valvular abnormalities.  No LGE or scar noted.  No evidence of cardiac sarcoid or infiltrative disease. Hypertension. Hyperlipidemia.   GERD. Pulmonary sarcoidosis. Asthma. Hypothyroidism.  In summary, Patient was previously followed by Dr. Monette.  She was last been seen by Dr. Monette in January 2018 with complaints of increased palpitations 24-hour Holter monitor in October 2017 showed HR 73 to 141 bpm, average 87 bpm, 13% PVCs.  Echo demonstrated EF 50 to 55%, no RWMA, normal diastolic parameters, mild MR, normal RV size/function, normal PA pressure, frequent PVCs.  Metoprolol  was increased to 75 mg twice a day.  She establish care with Dr. Hanson on 03/09/2017.  She reported less frequent PVCs and stable dyspnea.  She complained of nocturnal coughing causing chest soreness.  Repeat 24-hour Holter to reevaluate PVC burden was ordered and showed decrease burden to 6%.  Upon  follow-up in January 2019 she reported continued nocturnal coughing ever since being diagnosed with sarcoidosis.  She was referred back to pulmonology.  Repeat echo demonstrated EF 55 to 60%.  She could not undergo coronary CTA secondary to frequent PVCs.  Nuclear stress testing was a normal, low risk study.  Repeat echo and cardiac MRI performed in the latter part of 2021 as detailed above.  Upon follow-up in June 2022 she reported increased dyspnea in warm weather and with increase in pollen.  She noted feeling a bit more tired and short of breath after taking metoprolol  however sluggishness worse when not taking the medication.  She was transitioned from metoprolol  to bisoprolol .  Patient was last seen in the office by Dr. Hanson on 08/21/2021 for routine follow-up.  She reported feeling no different since transitioning from metoprolol  to bisoprolol .  A trial of holding bisoprolol  was discussed but deferred.  Patient underwent hospital admission from 10/30/2023 to 11/02/2023 for sarcoidosis flare. Patient was placed on steroids and inhaler. She was discharged on doxycycline .      History of Present Illness    Today, patient reports cough and shortness of breath are improving since being discharged from the hospital at the end of February. She reports she has been off medications including bisoprolol  since before October 2024. She is trying to get reestablished with all the specialities managing her care. She is followed by pulmonology for sarcoidosis. She reports chest pain associated with sarcoidosis that is unchanged from previous. She does experience some DOE. She also reports increased palpitations recently. Palpitations feel  like skipped beats. She walks for exercise as much as her breathing will allow.     ROS: All other systems reviewed and are otherwise negative except as noted in History of Present Illness.  EKGs/Labs Reviewed    EKG Interpretation Date/Time:  Friday December 24 2023 10:10:33  EDT Ventricular Rate:  87 PR Interval:  186 QRS Duration:  82 QT Interval:  368 QTC Calculation: 442 R Axis:   108  Text Interpretation: Sinus rhythm with Premature supraventricular complexes Possible Left atrial enlargement Rightward axis Nonspecific T wave abnormality When compared with ECG of 08-Nov-2023 18:49, PREVIOUS ECG IS PRESENT Confirmed by Loistine Sober [phone]) on 12/24/2023 10:18:24 AM   11/08/2023: ALT 34; AST 20; BUN 15; Creatinine, Ser 0.99; Potassium 4.9; Sodium 135   11/08/2023: Hemoglobin 15.3; WBC 12.4    Physical Exam    VS:  BP 118/80   Pulse 87   Ht 4' 9 (1.448 m)   Wt 143 lb (64.9 kg)   LMP 09/30/2020   SpO2 94%   BMI 30.94 kg/m  , BMI Body mass index is 30.94 kg/m.  GEN: Well nourished, well developed, in no acute distress. Neck: No JVD or carotid bruits. Cardiac:  RRR. Frequent extrasystole. No murmurs. No rubs or gallops.   Respiratory:  Respirations regular and unlabored. Clear to auscultation without rales, wheezing or rhonchi. GI: Soft, nontender, nondistended. Extremities: Radials/DP/PT 2+ and equal bilaterally. No clubbing or cyanosis. No edema.  Skin: Warm and dry, no rash. Neuro: Strength intact.  Assessment & Plan   Palpitations/PVCs Holter monitor October 2017 demonstrated a 13% PVC burden.  She was placed on metoprolol  and PVC burden improved to 6% in July 2018.  Echo November 2021 demonstrated normal LV/RV function, Grade I DD.  Cardiac MRI December 2021 showed no evidence of cardiac sarcoid or infiltrative disease.  In June 2022 she was transitioned from metoprolol  to bisoprolol  secondary to complaints of fatigue and dizziness.  Patient has not been on bisoprolol  since before October. She reports recent increase in palpitations. EKG today shows sinus rhythm with PACs. RRR with extrasystole on exam today.  -7 day Zio. - Restart bisoprolol .  Dyspnea Patient reports increased dyspnea on exertion. She has pulmonary sarcoidosis and is  followed by pulmonology. Cardiac MRI December 2021 showed no evidence of cardiac sarcoid or infiltrative disease. She tries to stay active walking for exercise as much as her breathing will allow.  -Update echo.   Hyperlipidemia It has been quite some time since her lipids were checked. She has an upcoming visit with PCP in May. Will defer labs to PCP.  - Continue rosuvastatin . -Keep upcoming visit with PCP.   Disposition: Restart bisoprolol . Echo and 7 day Zio. Return in 2 months or sooner as needed.          Signed, Sober HERO. Tylen Leverich, DNP, NP-C

## 2023-12-24 ENCOUNTER — Ambulatory Visit: Payer: MEDICAID

## 2023-12-24 ENCOUNTER — Encounter: Payer: Self-pay | Admitting: Student

## 2023-12-24 ENCOUNTER — Telehealth: Payer: Self-pay | Admitting: Pharmacy Technician

## 2023-12-24 ENCOUNTER — Ambulatory Visit: Payer: MEDICAID | Attending: Student | Admitting: Student

## 2023-12-24 ENCOUNTER — Other Ambulatory Visit (HOSPITAL_COMMUNITY): Payer: Self-pay

## 2023-12-24 VITALS — BP 118/80 | HR 87 | Ht <= 58 in | Wt 143.0 lb

## 2023-12-24 DIAGNOSIS — R0602 Shortness of breath: Secondary | ICD-10-CM | POA: Diagnosis not present

## 2023-12-24 DIAGNOSIS — R002 Palpitations: Secondary | ICD-10-CM

## 2023-12-24 DIAGNOSIS — R0609 Other forms of dyspnea: Secondary | ICD-10-CM | POA: Diagnosis not present

## 2023-12-24 DIAGNOSIS — I493 Ventricular premature depolarization: Secondary | ICD-10-CM

## 2023-12-24 DIAGNOSIS — E7800 Pure hypercholesterolemia, unspecified: Secondary | ICD-10-CM

## 2023-12-24 MED ORDER — BISOPROLOL FUMARATE 10 MG PO TABS
20.0000 mg | ORAL_TABLET | Freq: Every day | ORAL | 3 refills | Status: DC
Start: 2023-12-24 — End: 2024-02-28

## 2023-12-24 NOTE — Patient Instructions (Addendum)
 Medication Instructions:  Your Physician recommend you continue on your current medication as directed.    *If you need a refill on your cardiac medications before your next appointment, please call your pharmacy*  Lab Work: None today  Testing/Procedures: Your physician has requested that you have an echocardiogram. Echocardiography is a painless test that uses sound waves to create images of your heart. It provides your doctor with information about the size and shape of your heart and how well your heart's chambers and valves are working. This procedure takes approximately one hour. There are no restrictions for this procedure. Please do NOT wear cologne, perfume, aftershave, or lotions (deodorant is allowed). Please arrive 15 minutes prior to your appointment time.  Please note: We ask at that you not bring children with you during ultrasound (echo/ vascular) testing. Due to room size and safety concerns, children are not allowed in the ultrasound rooms during exams. Our front office staff cannot provide observation of children in our lobby area while testing is being conducted. An adult accompanying a patient to their appointment will only be allowed in the ultrasound room at the discretion of the ultrasound technician under special circumstances. We apologize for any inconvenience.  Your physician has recommended that you wear a Zio monitor for 7 days.   This monitor is a medical device that records the heart's electrical activity. Doctors most often use these monitors to diagnose arrhythmias. Arrhythmias are problems with the speed or rhythm of the heartbeat. The monitor is a small device applied to your chest. You can wear one while you do your normal daily activities. While wearing this monitor if you have any symptoms to push the button and record what you felt. Once you have worn this monitor for the period of time provider prescribed (Usually 14 days), you will return the monitor device  in the postage paid box. Once it is returned they will download the data collected and provide us  with a report which the provider will then review and we will call you with those results. Important tips:  Avoid showering during the first 24 hours of wearing the monitor. Avoid excessive sweating to help maximize wear time. Do not submerge the device, no hot tubs, and no swimming pools. Keep any lotions or oils away from the patch. After 24 hours you may shower with the patch on. Take brief showers with your back facing the shower head.  Do not remove patch once it has been placed because that will interrupt data and decrease adhesive wear time. Push the button when you have any symptoms and write down what you were feeling. Once you have completed wearing your monitor, remove and place into box which has postage paid and place in your outgoing mailbox.  If for some reason you have misplaced your box then call our office and we can provide another box and/or mail it off for you.      Follow-Up: At [facility], you and your health needs are our priority.  As part of our continuing mission to provide you with exceptional heart care, our providers are all part of one team.  This team includes your primary Cardiologist (physician) and Advanced Practice Providers or APPs (Physician Assistants and Nurse Practitioners) who all work together to provide you with the care you need, when you need it.  Your next appointment:   2 month(s)  Provider:   Sammy Crisp, MD or Morey Ar, NP

## 2023-12-24 NOTE — Telephone Encounter (Signed)
 Pharmacy Patient Advocate Encounter   Received notification from Fax that prior authorization for bisoprolol  is required/requested.   Insurance verification completed.   The patient is insured through [employer] .   Per test claim: PA required; PA submitted to above mentioned insurance via CoverMyMeds Key/confirmation #/EOC [id number] Status is pending

## 2023-12-27 NOTE — Telephone Encounter (Signed)
 Pharmacy Patient Advocate Encounter  Received notification from [facility] that Prior Authorization for bisoprolol  has been APPROVED from 12/25/23 to 12/24/24  I called [facility] and they said it hast to be ordered for tomorrow so she couldn't give me a copay until it comes   PA #/Case ID/Reference #: [id number]

## 2024-01-11 DIAGNOSIS — I493 Ventricular premature depolarization: Secondary | ICD-10-CM | POA: Diagnosis not present

## 2024-01-20 ENCOUNTER — Ambulatory Visit: Payer: MEDICAID | Admitting: Internal Medicine

## 2024-01-20 ENCOUNTER — Ambulatory Visit: Payer: Self-pay | Admitting: Emergency Medicine

## 2024-01-20 NOTE — Telephone Encounter (Signed)
 The patient has been notified of the results along with recommendations.  Pt reports palpations are "about the same" reports taking bisoprolol  5 mg daily when corrected to the prescribed 20 mg daily pt verbalized the mistake, pt a bit disconnected to this RNs questions. When asked if the palpations are bothering pt she she no and then yes. Pt denied further concerns. No questions from pt.  Pt encouraged to call or MyChart message if any concerns or questions arise

## 2024-01-26 ENCOUNTER — Ambulatory Visit: Payer: MEDICAID | Attending: Student

## 2024-01-26 DIAGNOSIS — R0602 Shortness of breath: Secondary | ICD-10-CM | POA: Insufficient documentation

## 2024-01-26 LAB — ECHOCARDIOGRAM COMPLETE
AR max vel: 1.48 cm2
AV Area VTI: 1.5 cm2
AV Area mean vel: 1.52 cm2
AV Mean grad: 4 mmHg
AV Peak grad: 6.7 mmHg
Ao pk vel: 1.29 m/s
Area-P 1/2: 4.6 cm2
S' Lateral: 3.66 cm

## 2024-02-03 NOTE — Progress Notes (Signed)
 Letter sent.

## 2024-02-20 NOTE — Progress Notes (Deleted)
 Psychiatric Initial Adult Assessment   Patient Identification: Jennifer [name] MRN:  [id number] Date of Evaluation:  02/20/2024 Referral Source: *** Chief Complaint:  No chief complaint on file.  Visit Diagnosis: No diagnosis found.  History of Present Illness:   Jennifer [name] is a 53 y.o. year old female with a history of depression, anxiety, mild cognitive impairment, Graves, hyperlipidemia, PVC, who is referred for depression.    History of trauma ? [facility]  Medication- Lurasidone 40 mg daily, citalopram 10 mg daily, doxetpine 25 mg   Associated Signs/Symptoms: Depression Symptoms:  {DEPRESSION SYMPTOMS:20000} (Hypo) Manic Symptoms:  {BHH MANIC SYMPTOMS:22872} Anxiety Symptoms:  {BHH ANXIETY SYMPTOMS:22873} Psychotic Symptoms:  {BHH PSYCHOTIC SYMPTOMS:22874} PTSD Symptoms: {BHH PTSD SYMPTOMS:22875}  Past Psychiatric History:  Outpatient:  Psychiatry admission:  Previous suicide attempt:  Past trials of medication:  History of violence:  History of head injury:   Previous Psychotropic Medications: {YES/NO:21197}  Substance Abuse History in the last 12 months:  {yes no:314532}  Consequences of Substance Abuse: {BHH CONSEQUENCES OF SUBSTANCE ABUSE:22880}  Past Medical History:  Past Medical History:  Diagnosis Date   Anxiety    Asthma    Chest pain    a. 08/2018 MV: EF 62%, no ischemia/scar.   Depression    Diastolic dysfunction    a. 09/2017 Echo: Nl LV size/fxn/wall thickness; b. 07/2020 Echo: EF 60-65%, no rwma, Gr1 DD. Nl RV size/fxn. Mild MR.   Fibromyalgia    Hyperthyroidism    Incontinence    Lymphadenopathy    Pneumonia    bilateral community acquired    PVC's (premature ventricular contractions)    a. 06/2016 24h Holter: Sinus rhythm, freq PVCs (13% burden), rare PACs; b. 03/2017 24h Holter: Predominantly sinus rhythm. Freq PVCs (6% burden); c. 08/2020 cMRI: EF 50%. Nl LV size/thickness. No LGE to suggest scar/infiltration. Nl RV size/fxn. Nl biatrial  sizes. No significant valvular abnormalities.   Sarcoidosis    Sepsis (HCC)    Shortness of breath dyspnea    Tachycardia     Past Surgical History:  Procedure Laterality Date   CESAREAN SECTION     1992/1994   COLONOSCOPY WITH PROPOFOL  N/A 11/25/2022   Procedure: COLONOSCOPY WITH PROPOFOL ;  Surgeon: Luke Salaam, MD;  Location: [facility] ENDOSCOPY;  Service: Gastroenterology;  Laterality: N/A;   ENDOBRONCHIAL ULTRASOUND N/A 01/27/2016   Procedure: ENDOBRONCHIAL ULTRASOUND;  Surgeon: Laine Piggs, MD;  Location: [facility] ORS;  Service: Cardiopulmonary;  Laterality: N/A;   ESOPHAGOGASTRODUODENOSCOPY (EGD) WITH PROPOFOL  N/A 11/21/2022   Procedure: ESOPHAGOGASTRODUODENOSCOPY (EGD) WITH PROPOFOL ;  Surgeon: Quintin Buckle, DO;  Location: [facility] ENDOSCOPY;  Service: Gastroenterology;  Laterality: N/A;   LYMPH NODE BIOPSY     VIDEO BRONCHOSCOPY N/A 01/27/2016   Procedure: VIDEO BRONCHOSCOPY WITH FLUORO;  Surgeon: Laine Piggs, MD;  Location: [facility] ORS;  Service: Cardiopulmonary;  Laterality: N/A;    Family Psychiatric History: ***  Family History:  Family History  Problem Relation Age of Onset   Lung cancer Mother    Diabetes Sister    Breast cancer Neg Hx     Social History:   Social History   Socioeconomic History   Marital status: Single    Spouse name: Not on file   Number of children: Not on file   Years of education: Not on file   Highest education level: Not on file  Occupational History   Occupation: cook  Tobacco Use   Smoking status: Never   Smokeless tobacco: Never  Vaping Use   Vaping status: Never  Used  Substance and Sexual Activity   Alcohol use: No    Alcohol/week: 0.0 standard drinks of alcohol   Drug use: No   Sexual activity: Not Currently  Other Topics Concern   Not on file  Social History Narrative   Not on file   Social Drivers of Health   Financial Resource Strain: Not on file  Food Insecurity: Food Insecurity Present (10/30/2023)   Hunger Vital Sign     Worried About Running Out of Food in the Last Year: Sometimes true    Ran Out of Food in the Last Year: Sometimes true  Transportation Needs: No Transportation Needs (10/30/2023)   PRAPARE - Administrator, Civil Service (Medical): No    Lack of Transportation (Non-Medical): No  Physical Activity: Not on file  Stress: Not on file  Social Connections: Not on file    Additional Social History: ***  Allergies:   Allergies  Allergen Reactions   Azithromycin  Nausea And Vomiting    Also noted to have prolong qtc on EKG after    Latex Itching   Penicillin G Hives    Metabolic Disorder Labs: No results found for: HGBA1C, MPG No results found for: PROLACTIN No results found for: CHOL, TRIG, HDL, CHOLHDL, VLDL, LDLCALC Lab Results  Component Value Date   TSH 1.152 11/20/2022    Therapeutic Level Labs: No results found for: LITHIUM No results found for: CBMZ No results found for: VALPROATE  Current Medications: Current Outpatient Medications  Medication Sig Dispense Refill   acetaminophen  (TYLENOL ) 500 MG tablet Take 500-1,000 mg by mouth every 6 (six) hours as needed for mild pain or fever.      albuterol  (PROVENTIL ) (2.5 MG/3ML) 0.083% nebulizer solution Take 3 mLs (2.5 mg total) by nebulization every 4 (four) hours as needed for wheezing or shortness of breath. 75 mL 2   bisoprolol  (ZEBETA ) 10 MG tablet Take 2 tablets (20 mg total) by mouth daily. 180 tablet 3   divalproex  (DEPAKOTE  ER) 500 MG 24 hr tablet Take 500 mg by mouth daily.      fluticasone -salmeterol (ADVAIR  DISKUS) 250-50 MCG/ACT AEPB Inhale 1 puff into the lungs 2 (two) times daily. 60 each 1   ibuprofen  (ADVIL ) 200 MG tablet Take 400-600 mg by mouth every 6 (six) hours as needed for fever or mild pain.      meloxicam (MOBIC) 7.5 MG tablet Take One tab PO BID with food     methimazole  (TAPAZOLE ) 10 MG tablet Take 10 mg by mouth daily.     omeprazole  (PRILOSEC) 40 MG capsule  Take 1 capsule (40 mg total) by mouth in the morning and at bedtime. 180 capsule 0   ondansetron  (ZOFRAN ) 4 MG tablet Take 1 tablet (4 mg total) by mouth every 6 (six) hours as needed for nausea or vomiting. 20 tablet 1   polyethylene glycol powder (GLYCOLAX /MIRALAX ) 17 GM/SCOOP powder Take 17 g by mouth as needed. 255 g 0   predniSONE  (DELTASONE ) 20 MG tablet Take 1 tablet (20 mg total) by mouth daily with breakfast. 7 days 7 tablet 1   PRESCRIPTION MEDICATION Nebulizer solution     PROAIR  HFA 108 (90 Base) MCG/ACT inhaler Inhale 1 puff into the lungs every 6 (six) hours as needed. 18 g 5   rosuvastatin  (CRESTOR ) 20 MG tablet Take 20 mg by mouth daily.     tiZANidine (ZANAFLEX) 2 MG tablet      Vitamin D , Cholecalciferol , 25 MCG (1000 UT) TABS Take 500  Units by mouth daily. 60 tablet    No current facility-administered medications for this visit.    Musculoskeletal: Strength & Muscle Tone: within normal limits Gait & Station: normal Patient leans: N/A  Psychiatric Specialty Exam: Review of Systems  Last menstrual period 09/30/2020.There is no height or weight on file to calculate BMI.  General Appearance: {Appearance:22683}  Eye Contact:  {BHH EYE CONTACT:22684}  Speech:  Clear and Coherent  Volume:  Normal  Mood:  {BHH MOOD:22306}  Affect:  {Affect (PAA):22687}  Thought Process:  Coherent  Orientation:  Full (Time, Place, and Person)  Thought Content:  Logical  Suicidal Thoughts:  {ST/HT (PAA):22692}  Homicidal Thoughts:  {ST/HT (PAA):22692}  Memory:  Immediate;   Good  Judgement:  {Judgement (PAA):22694}  Insight:  {Insight (PAA):22695}  Psychomotor Activity:  Normal  Concentration:  Concentration: Good and Attention Span: Good  Recall:  Good  Fund of Knowledge:Good  Language: Good  Akathisia:  No  Handed:  Right  AIMS (if indicated):  not done  Assets:  Communication Skills Desire for Improvement  ADL's:  Intact  Cognition: WNL  Sleep:  {BHH GOOD/FAIR/POOR:22877}    Screenings: PHQ2-9    Flowsheet Row Pulmonary Rehab from 03/21/2018 in [facility] Cardiac and Pulmonary Rehab Nutrition from 12/24/2017 in [facility] Total Score 3 4  PHQ-9 Total Score 14 13   Flowsheet Row ED from 11/08/2023 in [facility] Emergency Department at [facility] ED to Hosp-Admission (Discharged) from 10/29/2023 in [facility] Admission (Discharged) from 11/25/2022 in [facility]  C-SSRS RISK CATEGORY No Risk No Risk No Risk    Assessment and Plan:    Plan   The patient demonstrates the following risk factors for suicide: Chronic risk factors for suicide include: {Chronic Risk Factors for WNUUVOZ:36644034}. Acute risk factors for suicide include: {Acute Risk Factors for VQQVZDG:38756433}. Protective factors for this patient include: {Protective Factors for Suicide IRJJ:88416606}. Considering these factors, the overall suicide risk at this point appears to be {Desc; low/moderate/high:110033}. Patient {ACTION; IS/IS TKZ:60109323} appropriate for outpatient follow up.   Collaboration of Care: {BH OP Collaboration of Care:21014065}  Patient/Guardian was advised Release of Information must be obtained prior to any record release in order to collaborate their care with an outside provider. Patient/Guardian was advised if they have not already done so to contact the registration department to sign all necessary forms in order for us  to release information regarding their care.   Consent: Patient/Guardian gives verbal consent for treatment and assignment of benefits for services provided during this visit. Patient/Guardian expressed understanding and agreed to proceed.   Todd Fossa, MD 6/15/202511:39 AM

## 2024-02-23 NOTE — Progress Notes (Addendum)
 Cardiology Clinic Note   Date: 02/28/2024 ID: Jennifer [name], DOB 1971/03/10, MRN [id number]  Primary Cardiologist:  Lonni Hanson, MD  Chief Complaint   Jennifer [name] is a 53 y.o. female who presents to the clinic today for follow up after testing  Patient Profile   Jennifer [name] is followed by Dr. Hanson for the history outlined below.       Past medical history significant for: Palpitations/PVCs. Holter monitor 06/29/2016: HR 73 to 141 bpm, average 87 bpm.  103 isolated PACs.  Ventricular ectopy comprised 13% of total number of beats: 16,494 isolated PVCs, 51 ventricular bigeminal cycles.  No evidence of A-fib. 24-hour Holter 03/26/2017: HR 67 to 124 bpm, average 89 bpm.  Predominantly sinus rhythm.  Rare PACs.  Frequent isolated PVCs 6% burden.  Rare ventricular couplets.  No sustained arrhythmia or prolonged pauses was identified. Cardiac MRI 09/05/2020: Normal LV size/function.  Normal RV size/function.  No significant valvular abnormalities.  No LGE or scar noted.  No evidence of cardiac sarcoid or infiltrative disease. 8-day ZIO 01/11/2024: HR 48 to 147 bpm, average 96 bpm.  First-degree transient Mobitz type I second-degree AV block observed.  Rare PACs.  Frequent PVCs (10% burden). Dyspnea. Echo 01/26/2024: EF 55 to 60%.  No RWMA.  Grade 1 DD.  Normal RV size/function.  Mild MR. Hypertension. Hyperlipidemia.   Lipid panel 11/15/2023: LDL 94, HDL 62, TG 73, total 170. GERD. Pulmonary sarcoidosis. Asthma. Hypothyroidism.  In summary, Patient was previously followed by Dr. Monette.  She was last been seen by Dr. Monette in January 2018 with complaints of increased palpitations 24-hour Holter monitor in October 2017 showed HR 73 to 141 bpm, average 87 bpm, 13% PVCs.  Echo demonstrated EF 50 to 55%, no RWMA, normal diastolic parameters, mild MR, normal RV size/function, normal PA pressure, frequent PVCs.  Metoprolol  was increased to 75 mg twice a day.  She establish care with Dr.  Hanson on 03/09/2017.  She reported less frequent PVCs and stable dyspnea.  She complained of nocturnal coughing causing chest soreness.  Repeat 24-hour Holter to reevaluate PVC burden was ordered and showed decrease burden to 6%.  Upon follow-up in January 2019 she reported continued nocturnal coughing ever since being diagnosed with sarcoidosis.  She was referred back to pulmonology.  Repeat echo demonstrated EF 55 to 60%.  She could not undergo coronary CTA secondary to frequent PVCs.  Nuclear stress testing was a normal, low risk study.  Repeat echo and cardiac MRI performed in the latter part of 2021 as detailed above.  Upon follow-up in June 2022 she reported increased dyspnea in warm weather and with increase in pollen.  She noted feeling a bit more tired and short of breath after taking metoprolol  however sluggishness worse when not taking the medication.  She was transitioned from metoprolol  to bisoprolol .  Patient was seen in the office by Dr. Hanson on 08/21/2021 for routine follow-up.  She reported feeling no different since transitioning from metoprolol  to bisoprolol .  A trial of holding bisoprolol  was discussed but deferred.  Patient underwent hospital admission from 10/30/2023 to 11/02/2023 for sarcoidosis flare. Patient was placed on steroids and inhaler. She was discharged on doxycycline .    Patient was last seen in the office by me on 12/24/2023 for follow-up.  She reported improved cough and shortness of breath since hospital admission.  She reported being off medications including bisoprolol  since October 2020 for and was trying to get reestablished with specialties to resume  her care.  She reported chest pain associated with sarcoidosis unchanged from previous.  She was concerned about increased palpitations feeling like skipped beats.  She also mentioned increased dyspnea with exertion.  ZIO showed frequent PVCs (10% burden).  Echo demonstrated normal LV/RV function.     History of Present  Illness    Today, patient reports she has not been taking bisoprolol  since the beginning of June. She states she ran out and did not call for a refill. She was not taking bisoprolol  when she wore the Zio monitor in April. She did restart bisoprolol  in May and felt palpitations were decreased. Once she stopped bisoprolol  palpitations started back up. She denies chest pain, pressure or tightness. No lower extremity edema. She reports chronic dyspnea most notable at night. She wears 1 L of O2 when sleeping. She works as a Secretary/administrator.     ROS: All other systems reviewed and are otherwise negative except as noted in History of Present Illness.  EKGs/Labs Reviewed        11/08/2023: ALT 34; AST 20; BUN 15; Creatinine, Ser 0.99; Potassium 4.9; Sodium 135   11/08/2023: Hemoglobin 15.3; WBC 12.4    Physical Exam    VS:  BP 100/70 (BP Location: Left Arm, Patient Position: Sitting, Cuff Size: Large)   Pulse 91   Ht 4' 9 (1.448 m)   Wt 144 lb (65.3 kg)   LMP 09/30/2020   SpO2 98%   BMI 31.16 kg/m  , BMI Body mass index is 31.16 kg/m.  GEN: Well nourished, well developed, in no acute distress. Neck: No JVD or carotid bruits. Cardiac:  RRR. Frequent extrasystole. No murmur. No rubs or gallops.   Respiratory:  Respirations regular and unlabored. Clear to auscultation without rales, wheezing or rhonchi. GI: Soft, nontender, nondistended. Extremities: Radials/DP/PT 2+ and equal bilaterally. No clubbing or cyanosis. No edema  Skin: Warm and dry, no rash. Neuro: Strength intact.  Assessment & Plan   Palpitations/PVCs Holter monitor October 2017 demonstrated a 13% PVC burden.  She was placed on metoprolol  and PVC burden improved to 6% in July 2018.  Echo November 2021 demonstrated normal LV/RV function, Grade I DD.  Cardiac MRI December 2021 showed no evidence of cardiac sarcoid or infiltrative disease.  In June 2022 she was transitioned from metoprolol  to bisoprolol  secondary to complaints of  fatigue and dizziness. 8-day ZIO demonstrated 10% PVC burden.  Patient reports not taking bisoprolol  while wearing the heart monitor. She restarted it in May and took for 1 month until prescription ran out. She reports palpitations were better controlled on the medication. Discussed the importance of taking medication consistently. She agrees to restart medication today. 90 day supply will be called into her local pharmacy. She will take bisoprolol  for 1 month then repeat Zio. If she still has increased burden of PVCs she will be referred to EP. Patient voiced understanding and agreement with plan.  - Restart bisoprolol . - Repeat 7 day Zio starting around 7/23.    Dyspnea/pulmonary sarcoidosis Cardiac MRI December 2021 showed no evidence of cardiac sarcoid or infiltrative disease.  Echo May 2025 showed EF 55 to 60%, no RWMA, grade 1 DD, normal RV size/function, mild MR.  Patient reports baseline dyspnea particularly at night. She wears 1 L supplement O2 when sleeping.  - Continue to follow with pulmonology.    Hyperlipidemia LDL 94 March 2025, at goal.  - Continue rosuvastatin .  Disposition: Restart bisoprolol . 7 day Zio start on 7/23. Return in 6 months  or sooner as needed.          Signed, Barnie HERO. Idaly Verret, DNP, NP-C

## 2024-02-24 ENCOUNTER — Ambulatory Visit: Payer: MEDICAID | Admitting: Psychiatry

## 2024-02-28 ENCOUNTER — Ambulatory Visit: Payer: MEDICAID | Attending: Student | Admitting: Student

## 2024-02-28 ENCOUNTER — Encounter: Payer: Self-pay | Admitting: Student

## 2024-02-28 ENCOUNTER — Ambulatory Visit: Payer: MEDICAID

## 2024-02-28 VITALS — BP 100/70 | HR 91 | Ht <= 58 in | Wt 144.0 lb

## 2024-02-28 DIAGNOSIS — E785 Hyperlipidemia, unspecified: Secondary | ICD-10-CM | POA: Insufficient documentation

## 2024-02-28 DIAGNOSIS — R0609 Other forms of dyspnea: Secondary | ICD-10-CM | POA: Insufficient documentation

## 2024-02-28 DIAGNOSIS — I493 Ventricular premature depolarization: Secondary | ICD-10-CM | POA: Insufficient documentation

## 2024-02-28 DIAGNOSIS — D860 Sarcoidosis of lung: Secondary | ICD-10-CM | POA: Insufficient documentation

## 2024-02-28 DIAGNOSIS — R002 Palpitations: Secondary | ICD-10-CM | POA: Diagnosis not present

## 2024-02-28 MED ORDER — BISOPROLOL FUMARATE 10 MG PO TABS: 20.0000 mg | ORAL_TABLET | Freq: Every day | ORAL | 0 refills | Status: DC

## 2024-02-28 NOTE — Patient Instructions (Signed)
 Medication Instructions:  Your physician recommends that you continue on your current medications as directed. Please refer to the Current Medication list given to you today.    *If you need a refill on your cardiac medications before your next appointment, please call your pharmacy*  Lab Work: No labs ordered today   If you have labs (blood work) drawn today and your tests are completely normal, you will receive your results only by: MyChart Message (if you have MyChart) OR A paper copy in the mail If you have any lab test that is abnormal or we need to change your treatment, we will call you to review the results.  Testing/Procedures: Your physician has recommended that you wear a Zio monitor.   This monitor is a medical device that records the heart's electrical activity. Doctors most often use these monitors to diagnose arrhythmias. Arrhythmias are problems with the speed or rhythm of the heartbeat. The monitor is a small device applied to your chest. You can wear one while you do your normal daily activities. While wearing this monitor if you have any symptoms to push the button and record what you felt. Once you have worn this monitor for the period of time provider prescribed (Usually 14 days), you will return the monitor device in the postage paid box. Once it is returned they will download the data collected and provide us  with a report which the provider will then review and we will call you with those results. Important tips:  Avoid showering during the first 24 hours of wearing the monitor. Avoid excessive sweating to help maximize wear time. Do not submerge the device, no hot tubs, and no swimming pools. Keep any lotions or oils away from the patch. After 24 hours you may shower with the patch on. Take brief showers with your back facing the shower head.  Do not remove patch once it has been placed because that will interrupt data and decrease adhesive wear time. Push the button  when you have any symptoms and write down what you were feeling. Once you have completed wearing your monitor, remove and place into box which has postage paid and place in your outgoing mailbox.  If for some reason you have misplaced your box then call our office and we can provide another box and/or mail it off for you.   Follow-Up: At [facility], you and your health needs are our priority.  As part of our continuing mission to provide you with exceptional heart care, our providers are all part of one team.  This team includes your primary Cardiologist (physician) and Advanced Practice Providers or APPs (Physician Assistants and Nurse Practitioners) who all work together to provide you with the care you need, when you need it.  Your next appointment:   6 month(s)  Provider:   You may see Lonni Hanson, MD or one of the following Advanced Practice Providers on your designated Care Team:   Lonni Meager, NP Lesley Maffucci, PA-C Bernardino Bring, PA-C Cadence Atlantic, PA-C Tylene Lunch, NP Barnie Hila, NP    We recommend signing up for the patient portal called MyChart.  Sign up information is provided on this After Visit Summary.  MyChart is used to connect with patients for Virtual Visits (Telemedicine).  Patients are able to view lab/test results, encounter notes, upcoming appointments, etc.  Non-urgent messages can be sent to your provider as well.   To learn more about what you can do with MyChart, go to [email or web address].

## 2024-03-31 DIAGNOSIS — I493 Ventricular premature depolarization: Secondary | ICD-10-CM

## 2024-04-01 ENCOUNTER — Ambulatory Visit: Payer: Self-pay | Admitting: Student

## 2024-04-03 NOTE — Telephone Encounter (Signed)
Patient is calling to follow up on results. Please advise

## 2024-05-30 ENCOUNTER — Ambulatory Visit: Payer: MEDICAID | Admitting: Internal Medicine

## 2024-05-30 ENCOUNTER — Encounter: Payer: Self-pay | Admitting: Internal Medicine

## 2024-05-30 VITALS — BP 126/80 | HR 87 | Temp 97.9°F | Ht <= 58 in | Wt 146.6 lb

## 2024-05-30 DIAGNOSIS — J209 Acute bronchitis, unspecified: Secondary | ICD-10-CM

## 2024-05-30 DIAGNOSIS — R062 Wheezing: Secondary | ICD-10-CM

## 2024-05-30 DIAGNOSIS — D869 Sarcoidosis, unspecified: Secondary | ICD-10-CM

## 2024-05-30 DIAGNOSIS — J9611 Chronic respiratory failure with hypoxia: Secondary | ICD-10-CM | POA: Diagnosis not present

## 2024-05-30 MED ORDER — PROAIR HFA 108 (90 BASE) MCG/ACT IN AERS: 1.2000 | INHALATION_SPRAY | Freq: Four times a day (QID) | RESPIRATORY_TRACT | 5 refills | Status: DC | PRN

## 2024-05-30 MED ORDER — FLUTICASONE-SALMETEROL 250-50 MCG/ACT IN AEPB: 1.0000 | INHALATION_SPRAY | Freq: Two times a day (BID) | RESPIRATORY_TRACT | 1 refills | Status: AC

## 2024-05-30 MED ORDER — ALBUTEROL SULFATE (2.5 MG/3ML) 0.083% IN NEBU: 2.5000 mg | INHALATION_SOLUTION | RESPIRATORY_TRACT | 2 refills | Status: AC | PRN

## 2024-05-30 NOTE — Progress Notes (Signed)
 [facility] Pulmonary Medicine Consultation      MRN# [id number] Jennifer [name] 04/14/1971 Brief History: Synopsis: 52 year old female with left upper lobe cavitary lesion, history of bilateral pneumonia, currently being followed by pulmonary and infectious disease.  Cavitary lesion differential at this time includes fungal infection versus sarcoidosis.  treated as fungal infection by infectious disease and with steroids by pulmonary for suspected Sarcoid.  Diagnosis of aspergilloma Previous 6-minute walk and ONO within normal limits  Has intermittent fatigue at times She does not want to taper steroids at this time Robitussin with codeine  helps her cough  CT chest August 03, 2020  Independently reviewed and also reviewed with patient Chronic bilateral cylindrical bronchiectasis Persistent cavitary lesion in the left upper lobe containing intramural soft tissue mass. The area of cavitation is similar to 2017 study with slight enlargement of the intramural soft tissue mass.   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   Patient also has extreme back pain which is unrelated to her lung disease PET scan low level activity  CT chest March 2024 No significant change in the left upper lobe cavitary lesion      CC: Follow-up assessment for shortness of breath Follow-up assessment for abnormal CT chest  HPI Previous office visit viral bronchitis with gastroenteritis recommend using over-the-counter medications with DayQuil and NyQuil prednisone  and antibiotic therapy  Currently her shortness of breath seems to be stable Patient does have a cough at night however she only takes Advair  1 puff daily I have advised that she increase her Advair  to 2 puffs twice a day I have also advised her to take her Claritin  at night and restart her omeprazole  for reflux disease  No exacerbation at this time No evidence  of heart failure at this time No evidence or signs of infection at this time No respiratory distress No fevers, chills, nausea, vomiting, diarrhea No evidence of lower extremity edema No evidence hemoptysis  Ambulating pulse oximetry in the office today did not reveal hypoxia O2 sat remained 94 to 96%   Medication:    Current Outpatient Medications:    acetaminophen  (TYLENOL ) 500 MG tablet, Take 500-1,000 mg by mouth every 6 (six) hours as needed for mild pain or fever. , Disp: , Rfl:    albuterol  (PROVENTIL ) (2.5 MG/3ML) 0.083% nebulizer solution, Take 3 mLs (2.5 mg total) by nebulization every 4 (four) hours as needed for wheezing or shortness of breath., Disp: 75 mL, Rfl: 2   bisoprolol  (ZEBETA ) 10 MG tablet, Take 2 tablets (20 mg total) by mouth daily., Disp: 90 tablet, Rfl: 0   divalproex  (DEPAKOTE  ER) 500 MG 24 hr tablet, Take 500 mg by mouth daily. , Disp: , Rfl:    fluticasone -salmeterol (ADVAIR  DISKUS) 250-50 MCG/ACT AEPB, Inhale 1 puff into the lungs 2 (two) times daily., Disp: 60 each, Rfl: 1   ibuprofen  (ADVIL ) 200 MG tablet, Take 400-600 mg by mouth every 6 (six) hours as needed for fever or mild pain. , Disp: , Rfl:    meloxicam (MOBIC) 7.5 MG tablet, Take One tab PO BID with food, Disp: , Rfl:    methimazole  (TAPAZOLE ) 10 MG tablet, Take 10 mg by mouth daily., Disp: , Rfl:    omeprazole  (PRILOSEC) 40 MG capsule, Take 1 capsule (40 mg total) by mouth in the morning and at bedtime., Disp: 180 capsule, Rfl: 0   ondansetron  (ZOFRAN ) 4 MG tablet, Take 1 tablet (4 mg total) by mouth every  6 (six) hours as needed for nausea or vomiting., Disp: 20 tablet, Rfl: 1   polyethylene glycol powder (GLYCOLAX /MIRALAX ) 17 GM/SCOOP powder, Take 17 g by mouth as needed., Disp: 255 g, Rfl: 0   predniSONE  (DELTASONE ) 20 MG tablet, Take 1 tablet (20 mg total) by mouth daily with breakfast. 7 days, Disp: 7 tablet, Rfl: 1   PRESCRIPTION MEDICATION, Nebulizer solution, Disp: , Rfl:    PROAIR  HFA 108  (90 Base) MCG/ACT inhaler, Inhale 1 puff into the lungs every 6 (six) hours as needed., Disp: 18 g, Rfl: 5   rosuvastatin  (CRESTOR ) 20 MG tablet, Take 20 mg by mouth daily., Disp: , Rfl:    tiZANidine (ZANAFLEX) 2 MG tablet, , Disp: , Rfl:    Vitamin D , Cholecalciferol , 25 MCG (1000 UT) TABS, Take 500 Units by mouth daily., Disp: 60 tablet, Rfl:    LMP 09/30/2020   BP 126/80   Pulse 87   Temp 97.9 F (36.6 C)   Ht 4' 9 (1.448 m)   Wt 146 lb 9.6 oz (66.5 kg)   LMP 09/30/2020   SpO2 98%   BMI 31.72 kg/m     Review of Systems: Gen:  Denies  fever, sweats, chills weight loss  HEENT: Denies blurred vision, double vision, ear pain, eye pain, hearing loss, nose bleeds, sore throat Cardiac:  No dizziness, chest pain or heaviness, chest tightness,edema, No JVD Resp:   + cough, -sputum production, +shortness of breath,-wheezing, -hemoptysis,  Other:  All other systems negative   Physical Examination:   General Appearance: No distress  EYES PERRLA, EOM intact.   NECK Supple, No JVD Pulmonary: normal breath sounds, No wheezing.  CardiovascularNormal S1,S2.  No m/r/g.   Abdomen: Benign, Soft, non-tender. 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 presenting with follow-up visit with history of aspergilloma with signs and symptoms of increasing shortness of breath and dyspnea on exertion with chronic hypoxic respiratory failure which is stable with a history of allergic rhinitis and GERD  Chronic shortness of breath chronic respiratory failure with hypoxia Related to  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 COPD Chronic Hypoxic resp failure due to COPD -Patient benefits from oxygen therapy 2L [place]  -recommend using oxygen as prescribed -patient needs this for survival   Asthma COPD Recommend increasing Advair  2 puffs in the morning 2 puffs at night Rinse mouth after use Avoid Allergens and Irritants Avoid secondhand smoke Avoid SICK contacts Recommend  Masking  when appropriate Recommend Keep up-to-date with vaccinations Recommend nebulized therapy as prescribed  Abnormal CT chest Previous history of aspergilloma Previously had voriconazole therapy early in 2016 but developed a fungus ball on CT chest ID has taken her off voriconazole 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 With left upper lobe pulmonary cavitary lesion with soft intramural tissue mass Since there are no significant CT scan changes over the last couple of years there is no indication for PET scan at this time we will follow along very closely   Allergic rhinitis Start Claritin  at night  GERD restart omeprazole    MEDICATION ADJUSTMENTS/LABS AND TESTS ORDERED: Avoid Allergens and Irritants Avoid secondhand  smoke Avoid SICK contacts Recommend  Masking  when appropriate Recommend Keep up-to-date with vaccinations Continue oxygen as prescribed Advair  2 puffs twice daily Rinse mouth Restart omeprazole  Use Claritin  at night   CURRENT MEDICATIONS REVIEWED AT LENGTH WITH PATIENT TODAY   Patient  satisfied with Plan of action and management. All questions answered   Follow up 6 months   I spent a total of 45 minutes dedicated to the care of this patient on the date of this encounter to include pre-visit review of records, face-to-face time with the patient discussing conditions above, post visit ordering of testing, clinical documentation with the electronic health record, making appropriate referrals as documented, and communicating necessary information to the  patient's healthcare team.    The Patient requires high complexity decision making for assessment and support, frequent evaluation and titration of therapies, application of advanced monitoring technologies and extensive interpretation of multiple databases.  Patient satisfied with Plan of action and management. All questions answered    Nickolas Alm Cellar, M.D.  [facility]  Medical Director [facility] Medical Director [facility] Cardio-Pulmonary Department

## 2024-05-30 NOTE — Patient Instructions (Signed)
 Recommend using 2 puffs Advair  in the morning and 2 puffs of Advair  at night Please rinse mouth after every use  Continue oxygen at nighttime Plan to start using Claritin  at night Please start omeprazole  for your reflux  Will referral to DME company for tubing for oxygen  Avoid Allergens and Irritants Avoid secondhand smoke Avoid SICK contacts Recommend  Masking  when appropriate Recommend Keep up-to-date with vaccinations

## 2024-05-30 NOTE — Progress Notes (Deleted)
 [facility] Pulmonary Medicine Consultation      MRN# [id number] MARYAMA KURIAKOSE 08-10-1971 Brief History: Synopsis: 53 year old female with left upper lobe cavitary lesion, history of bilateral pneumonia, currently being followed by pulmonary and infectious disease.  Cavitary lesion differential at this time includes fungal infection versus sarcoidosis.  treated as fungal infection by infectious disease and with steroids by pulmonary for suspected Sarcoid.  Diagnosis of aspergilloma Previous 6-minute walk and ONO within normal limits  Has intermittent fatigue at times She does not want to taper steroids at this time Robitussin with codeine  helps her cough  CT chest August 03, 2020  Independently reviewed and also reviewed with patient Chronic bilateral cylindrical bronchiectasis Persistent cavitary lesion in the left upper lobe containing intramural soft tissue mass. The area of cavitation is similar to 2017 study with slight enlargement of the intramural soft tissue mass.   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   Patient also has extreme back pain which is unrelated to her lung disease PET scan low level activity  CT chest March 2024 No significant change in the left upper lobe cavitary lesion      CC: Follow-up assessment shortness of breath Follow-up assessment for abnormal CT chest  HPI Patient with acute viral bronchitis at this time Patient with nausea vomiting Patient having a hard time feeling better with fevers Patient claims she was tested for flu RSV and COVID which were all negative At this time patient has a viral bronchitis with gastroenteritis   Patient is having increased shortness of breath Patient advised to start Advair  at previous office visit Increased dyspnea on exertion +cough + wheezing + fevers  I recommend using over-the-counter medications with DayQuil and  NyQuil Patient currently on prednisone  and antibiotic therapy Will continue with these medications  I recommend that patient go to the ER if symptoms get worse    Medication:    Current Outpatient Medications:    acetaminophen  (TYLENOL ) 500 MG tablet, Take 500-1,000 mg by mouth every 6 (six) hours as needed for mild pain or fever. , Disp: , Rfl:    albuterol  (PROVENTIL ) (2.5 MG/3ML) 0.083% nebulizer solution, Take 3 mLs (2.5 mg total) by nebulization every 4 (four) hours as needed for wheezing or shortness of breath., Disp: 75 mL, Rfl: 2   bisoprolol  (ZEBETA ) 10 MG tablet, Take 2 tablets (20 mg total) by mouth daily., Disp: 90 tablet, Rfl: 0   divalproex  (DEPAKOTE  ER) 500 MG 24 hr tablet, Take 500 mg by mouth daily. , Disp: , Rfl:    fluticasone -salmeterol (ADVAIR  DISKUS) 250-50 MCG/ACT AEPB, Inhale 1 puff into the lungs 2 (two) times daily., Disp: 60 each, Rfl: 1   ibuprofen  (ADVIL ) 200 MG tablet, Take 400-600 mg by mouth every 6 (six) hours as needed for fever or mild pain. , Disp: , Rfl:    meloxicam (MOBIC) 7.5 MG tablet, Take One tab PO BID with food, Disp: , Rfl:    methimazole  (TAPAZOLE ) 10 MG tablet, Take 10 mg by mouth daily., Disp: , Rfl:    omeprazole  (PRILOSEC) 40 MG capsule, Take 1 capsule (40 mg total) by mouth in the morning and at bedtime., Disp: 180 capsule, Rfl: 0   ondansetron  (ZOFRAN ) 4 MG tablet, Take 1 tablet (4 mg total) by mouth every 6 (six) hours as needed for nausea or vomiting., Disp: 20 tablet, Rfl: 1   polyethylene glycol powder (GLYCOLAX /MIRALAX ) 17 GM/SCOOP powder, Take 17 g by mouth  as needed., Disp: 255 g, Rfl: 0   predniSONE  (DELTASONE ) 20 MG tablet, Take 1 tablet (20 mg total) by mouth daily with breakfast. 7 days, Disp: 7 tablet, Rfl: 1   PRESCRIPTION MEDICATION, Nebulizer solution, Disp: , Rfl:    PROAIR  HFA 108 (90 Base) MCG/ACT inhaler, Inhale 1 puff into the lungs every 6 (six) hours as needed., Disp: 18 g, Rfl: 5   rosuvastatin  (CRESTOR ) 20 MG  tablet, Take 20 mg by mouth daily., Disp: , Rfl:    tiZANidine (ZANAFLEX) 2 MG tablet, , Disp: , Rfl:    Vitamin D , Cholecalciferol , 25 MCG (1000 UT) TABS, Take 500 Units by mouth daily., Disp: 60 tablet, Rfl:    LMP 09/30/2020     Review of Systems: Gen:  +  fever, +sweats,  HEENT: Denies blurred vision, double vision, ear pain, eye pain, hearing loss, nose bleeds, sore throat Cardiac:  No dizziness, chest pain or heaviness, chest tightness,edema, No JVD Resp:   +cough, +sputum production, +shortness of breath,+wheezing, -hemoptysis,  Other:  All other systems negative   Physical Examination:   General Appearance: No distress  EYES PERRLA, EOM intact.   NECK Supple, No JVD Pulmonary: normal breath sounds, No wheezing.  CardiovascularNormal S1,S2.  No m/r/g.   Abdomen: Benign, Soft, non-tender. 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 presenting with follow-up visit with history of aspergilloma with signs and symptoms of increasing shortness of breath and dyspnea on exertion, findings are consistent with acute viral bronchitis with acute gastroenteritis  Acute viral bronchitis and gastroenteritis Patient will continue prednisone  as prescribed Patient is to continue her antibiotics as prescribed Recommend over-the-counter medications for cough suppressant and Tylenol  (DayQuil and NyQuil as directed) Continue inhalers as prescribed Recommend nebulized therapy as prescribed  Avoid Allergens and Irritants Avoid secondhand smoke Avoid SICK contacts Recommend  Masking  when  appropriate Recommend Keep up-to-date with vaccinations  Abnormal CT chest Previous history of aspergilloma Previously had voriconazole therapy early in 2016 but developed a fungus ball on CT chest ID has taken her off voriconazole 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 With left upper lobe pulmonary cavitary lesion with soft intramural tissue mass Since there are no significant CT scan changes over the last couple of years there is no indication for PET scan at this time we will follow along very closely    MEDICATION ADJUSTMENTS/LABS AND TESTS ORDERED: Prednisone  20 mg daily for 7 days Continue the antibiotic that you have as prescribed Recommend DayQuil over-the-counter medicine for cough and congestion use as directed Recommend NyQuil over-the-counter medicine for cough and congestion use as directed Avoid Allergens and Irritants Avoid secondhand smoke Avoid SICK contacts Recommend  Masking  when appropriate Recommend Keep up-to-date with vaccinations   CURRENT MEDICATIONS REVIEWED AT LENGTH WITH PATIENT TODAY   Patient  satisfied with Plan of action and management. All questions answered   Follow up 6 months   I spent a total of 43 minutes reviewing chart data, face-to-face evaluation with the patient, counseling and coordination of care as detailed above.      Nickolas Alm Cellar, M.D.  [facility]  Medical Director [facility] Medical Director [facility] Cardio-Pulmonary Department

## 2024-06-26 ENCOUNTER — Other Ambulatory Visit: Payer: Self-pay | Admitting: Nurse Practitioner

## 2024-06-26 ENCOUNTER — Ambulatory Visit
Admission: RE | Admit: 2024-06-26 | Discharge: 2024-06-26 | Disposition: A | Discharge status: Home / Self Care | Payer: MEDICAID | Source: Ambulatory Visit | Attending: Nurse Practitioner | Admitting: Nurse Practitioner

## 2024-06-26 ENCOUNTER — Ambulatory Visit
Admission: RE | Admit: 2024-06-26 | Discharge: 2024-06-26 | Disposition: A | Discharge status: Home / Self Care | Payer: MEDICAID | Attending: Nurse Practitioner | Admitting: Nurse Practitioner

## 2024-06-26 DIAGNOSIS — J4521 Mild intermittent asthma with (acute) exacerbation: Secondary | ICD-10-CM

## 2024-07-06 ENCOUNTER — Encounter: Payer: Self-pay | Admitting: Nurse Practitioner

## 2024-07-10 ENCOUNTER — Ambulatory Visit
Admission: RE | Admit: 2024-07-10 | Discharge: 2024-07-10 | Disposition: A | Discharge status: Home / Self Care | Payer: MEDICAID | Attending: Nurse Practitioner | Admitting: Nurse Practitioner

## 2024-07-10 ENCOUNTER — Ambulatory Visit: Payer: Self-pay | Admitting: Nurse Practitioner

## 2024-07-10 ENCOUNTER — Ambulatory Visit
Admission: RE | Admit: 2024-07-10 | Discharge: 2024-07-10 | Disposition: A | Payer: MEDICAID | Source: Ambulatory Visit | Attending: Nurse Practitioner

## 2024-07-10 ENCOUNTER — Ambulatory Visit (INDEPENDENT_AMBULATORY_CARE_PROVIDER_SITE_OTHER): Payer: MEDICAID | Admitting: Nurse Practitioner

## 2024-07-10 ENCOUNTER — Encounter: Payer: Self-pay | Admitting: Nurse Practitioner

## 2024-07-10 ENCOUNTER — Ambulatory Visit
Admission: RE | Admit: 2024-07-10 | Discharge: 2024-07-10 | Disposition: A | Payer: MEDICAID | Source: Home / Self Care | Attending: Nurse Practitioner | Admitting: Nurse Practitioner

## 2024-07-10 VITALS — BP 150/100 | HR 115 | Temp 98.1°F | Ht <= 58 in | Wt 145.0 lb

## 2024-07-10 DIAGNOSIS — R0609 Other forms of dyspnea: Secondary | ICD-10-CM

## 2024-07-10 DIAGNOSIS — R600 Localized edema: Secondary | ICD-10-CM

## 2024-07-10 DIAGNOSIS — J441 Chronic obstructive pulmonary disease with (acute) exacerbation: Secondary | ICD-10-CM | POA: Diagnosis not present

## 2024-07-10 DIAGNOSIS — I491 Atrial premature depolarization: Secondary | ICD-10-CM

## 2024-07-10 DIAGNOSIS — R000 Tachycardia, unspecified: Secondary | ICD-10-CM | POA: Diagnosis not present

## 2024-07-10 DIAGNOSIS — I10 Essential (primary) hypertension: Secondary | ICD-10-CM

## 2024-07-10 DIAGNOSIS — E0500 Thyrotoxicosis with diffuse goiter without thyrotoxic crisis or storm: Secondary | ICD-10-CM

## 2024-07-10 LAB — CBC WITH DIFFERENTIAL/PLATELET
Abs Immature Granulocytes: 0.01 K/uL (ref 0.00–0.07)
Basophils Absolute: 0 K/uL (ref 0.0–0.1)
Basophils Relative: 1 %
Eosinophils Absolute: 0.1 K/uL (ref 0.0–0.5)
Eosinophils Relative: 2 %
HCT: 39.4 % (ref 36.0–46.0)
Hemoglobin: 12.7 g/dL (ref 12.0–15.0)
Immature Granulocytes: 0 %
Lymphocytes Relative: 26 %
Lymphs Abs: 1 K/uL (ref 0.7–4.0)
MCH: 25 pg — ABNORMAL LOW (ref 26.0–34.0)
MCHC: 32.2 g/dL (ref 30.0–36.0)
MCV: 77.7 fL — ABNORMAL LOW (ref 80.0–100.0)
Monocytes Absolute: 0.3 K/uL (ref 0.1–1.0)
Monocytes Relative: 8 %
Neutro Abs: 2.5 K/uL (ref 1.7–7.7)
Neutrophils Relative %: 63 %
Platelets: 244 K/uL (ref 150–400)
RBC: 5.07 MIL/uL (ref 3.87–5.11)
RDW: 13.8 % (ref 11.5–15.5)
WBC: 4 K/uL (ref 4.0–10.5)
nRBC: 0 % (ref 0.0–0.2)

## 2024-07-10 LAB — BASIC METABOLIC PANEL WITH GFR
Anion gap: 10 (ref 5–15)
BUN: 14 mg/dL (ref 6–20)
CO2: 26 mmol/L (ref 22–32)
Calcium: 8.8 mg/dL — ABNORMAL LOW (ref 8.9–10.3)
Chloride: 102 mmol/L (ref 98–111)
Creatinine, Ser: 0.98 mg/dL (ref 0.44–1.00)
GFR, Estimated: >60 mL/min (ref >60)
Glucose, Bld: 117 mg/dL — ABNORMAL HIGH (ref 70–99)
Potassium: 3.5 mmol/L (ref 3.5–5.1)
Sodium: 138 mmol/L (ref 135–145)

## 2024-07-10 LAB — D-DIMER, QUANTITATIVE: D-Dimer, Quant: <0.27 ug{FEU}/mL (ref 0.00–0.50)

## 2024-07-10 LAB — TSH: TSH: 3.093 u[IU]/mL (ref 0.350–4.500)

## 2024-07-10 LAB — BRAIN NATRIURETIC PEPTIDE: B Natriuretic Peptide: 9.6 pg/mL (ref 0.0–100.0)

## 2024-07-10 NOTE — Progress Notes (Signed)
 CXR also negative for any acute or infectious process. Thanks.

## 2024-07-10 NOTE — Progress Notes (Signed)
 Labs were all unremarkable. D dimer was negative so very low likelihood of a blood clot, which is good news. Possible this is just asthma/COPD flare. Still encourage her to reach out to cardiology regarding elevated BP and HR. Thanks!

## 2024-07-10 NOTE — Patient Instructions (Addendum)
 Continue Albuterol  inhaler 2 puffs or 3 mL neb every 6 hours as needed for shortness of breath or wheezing. Notify if symptoms persist despite rescue inhaler/neb use.  Continue Advair  1 puff Twice daily. Make sure you're using it twice a day. Brush tongue and rinse mouth afterwards  Complete the levaquin   Prednisone  taper. 4 tabs for 3 days, then 3 tabs for 3 days, 2 tabs for 3 days, then 1 tab for 3 days, then stop. Take in AM with food   Notify your heart doctor of your elevated blood pressure and heart rate  Labs and chest x ray today   Follow up in 7-10 days with Dr. Isaiah or Jennifer [name]. If symptoms do not improve or worsen, please contact office for sooner follow up or seek emergency care.
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
## 2024-07-10 NOTE — Progress Notes (Signed)
 @Patient  ID: Jennifer [name], female    DOB: [birth year], 53 y.o.   MRN: [id number]  No chief complaint on file.   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 She is a patient of Dr. Jacqulyn and last seen in office 05/30/2024. Past medical history significant for HTN, hx of PVCs, GERD, Graves, hx of QT prolongation, fibromyalgia, HLD, depression, anxiety.   TEST/EVENTS:  2016 spirometry: FVC 68, FEV1 67, ratio 76 11/20/2022 CTA chest: small hiatal hernia. Stable btx, most pronounced in upper lobes. Stable LUL cavitary lesion. Scattered b/l ground glass opacities R>L with bronchial wall thickening.  06/26/2024 CXR: stable coarse peripheral opacity in LUL  05/30/2024: OV with Dr. Isaiah. SOB stable. Cough at night. Only using Advair  once a day. Advised to take twice daily. Encouraged to resume claritin  and restart omeprazole  for reflux as well. No desaturations on room air with exertion.   07/10/2024: Today - acute Discussed the use of AI scribe software for clinical note transcription with the patient, who gave verbal consent to proceed.  History of Present Illness Jennifer [name] is a 53 year old female who presents with shortness of breath.   She has been experiencing shortness of breath and nasal congestion for the last three weeks. No coughing up colored sputum but significant nasal congestion and a sensation of being unable to breathe through her nose. No nasal drainage that she's noticed. A previous course of steroids provided some relief, but symptoms recurred.   A chest x-ray performed 10/20 showed no pneumonia but some chronic scarring. Her symptoms actually began in late August after changes in her air conditioning system, leading to stuffiness that did not improve  with cold medicine. She was treated with antibiotics and steroids in September, which initially improved her condition, but symptoms have since flared up again.  She reports a little swelling in both legs and slight chest pain this morning but no current pain. No palpitations, syncope, PND, orthopnea.   She has had a headache the last few weeks. Not monitoring her blood pressure at home. No vision changes or dizziness. No ear pain or fullness. No tinnitus.   No fevers, chills, hemoptysis. No chest congestion. Not noticing much wheezing since she did the steroids but occasionally has some in the mornings.   No calf pain, swelling or warmth.   She uses an Advair  inhaler only once a day but missed a dose yesterday due to a busy schedule. She uses albuterol  as needed but has not used it recently due to current medication. Before starting steroids, she used albuterol  three times a day, which decreased with steroid use.  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 She did not have any positive viral testing at onset of symptoms.   She does have a history of Graves disease. No hot/cold intolerance, weight changes, hair loss.    Unable to complete FeNO   Allergies  Allergen Reactions   Penicillins    Azithromycin  Nausea And Vomiting    Also noted to have prolong qtc on EKG after    Latex Itching   Penicillin G Hives    Immunization History  Administered Date(s) Administered   Influenza Split 08/12/2015   Influenza,inj,Quad PF,6+ Mos 05/29/2019   Influenza-Unspecified 07/08/2017, 05/29/2019, 05/13/2020  PFIZER(Purple Top)SARS-COV-2 Vaccination 11/27/2019, 12/18/2019   PPD Test 03/18/2021, 10/01/2023   Tdap 10/18/2015, 02/09/2022    Past Medical History:  Diagnosis Date   Anxiety    Asthma    Chest pain    a. 08/2018 MV: EF 62%, no ischemia/scar.   Depression    Diastolic dysfunction    a. 09/2017 Echo: Nl LV size/fxn/wall thickness; b. 07/2020 Echo: EF 60-65%, no rwma,  Gr1 DD. Nl RV size/fxn. Mild MR.   Fibromyalgia    Hyperthyroidism    Incontinence    Lymphadenopathy    Pneumonia    bilateral community acquired    PVC's (premature ventricular contractions)    a. 06/2016 24h Holter: Sinus rhythm, freq PVCs (13% burden), rare PACs; b. 03/2017 24h Holter: Predominantly sinus rhythm. Freq PVCs (6% burden); c. 08/2020 cMRI: EF 50%. Nl LV size/thickness. No LGE to suggest scar/infiltration. Nl RV size/fxn. Nl biatrial sizes. No significant valvular abnormalities.   Sarcoidosis    Sepsis (HCC)    Shortness of breath dyspnea    Tachycardia     Tobacco History: Social History   Tobacco Use  Smoking Status Never  Smokeless Tobacco Never   Counseling given: Not Answered   Outpatient Medications Prior to Visit  Medication Sig Dispense Refill   acetaminophen  (TYLENOL ) 500 MG tablet Take 500-1,000 mg by mouth every 6 (six) hours as needed for mild pain or fever.      albuterol  (PROVENTIL ) (2.5 MG/3ML) 0.083% nebulizer solution Take 3 mLs (2.5 mg total) by nebulization every 4 (four) hours as needed for wheezing or shortness of breath. 75 mL 2   bisoprolol  (ZEBETA ) 10 MG tablet Take 2 tablets (20 mg total) by mouth daily. 90 tablet 0   fluticasone -salmeterol (ADVAIR  DISKUS) 250-50 MCG/ACT AEPB Inhale 1 puff into the lungs 2 (two) times daily. 60 each 1   levofloxacin  (LEVAQUIN ) 750 MG tablet Take 750 mg by mouth daily.     Oxcarbazepine (TRILEPTAL) 300 MG tablet Take 300 mg by mouth 2 (two) times daily.     polyethylene glycol powder (GLYCOLAX /MIRALAX ) 17 GM/SCOOP powder Take 17 g by mouth as needed. 255 g 0   rosuvastatin  (CRESTOR ) 20 MG tablet Take 20 mg by mouth daily.     traZODone  (DESYREL ) 50 MG tablet Take 50 mg by mouth at bedtime.     Vitamin D , Cholecalciferol , 25 MCG (1000 UT) TABS Take 500 Units by mouth daily. 60 tablet    methimazole  (TAPAZOLE ) 10 MG tablet Take 10 mg by mouth daily. (Patient not taking: Reported on 07/10/2024)     ondansetron   (ZOFRAN ) 4 MG tablet Take 1 tablet (4 mg total) by mouth every 6 (six) hours as needed for nausea or vomiting. (Patient not taking: Reported on 07/10/2024) 20 tablet 1   divalproex  (DEPAKOTE  ER) 500 MG 24 hr tablet Take 500 mg by mouth daily.      ibuprofen  (ADVIL ) 200 MG tablet Take 400-600 mg by mouth every 6 (six) hours as needed for fever or mild pain.      meloxicam (MOBIC) 7.5 MG tablet Take One tab PO BID with food     omeprazole  (PRILOSEC) 40 MG capsule Take 1 capsule (40 mg total) by mouth in the morning and at bedtime. 180 capsule 0   predniSONE  (DELTASONE ) 20 MG tablet Take 1 tablet (20 mg total) by mouth daily with breakfast. 7 days 7 tablet 1   PRESCRIPTION MEDICATION Nebulizer solution     tiZANidine (ZANAFLEX) 2 MG tablet  No facility-administered medications prior to visit.     Review of Systems: as above     Physical Exam:  BP (!) 150/100   Pulse (!) 115   Temp 98.1 F (36.7 C) (Temporal)   Ht 4' 9 (1.448 m)   Wt 145 lb (65.8 kg)   LMP 09/30/2020   SpO2 98%   BMI 31.38 kg/m   GEN: Pleasant, interactive, well-kempt; obese; in no acute distress HEENT:  Normocephalic and atraumatic. PERRLA. Sclera white. Nasal turbinates pink, moist and patent bilaterally. No rhinorrhea present. Oropharynx pink and moist, without exudate or edema. No lesions, ulcerations, or postnasal drip.  NECK:  Supple w/ fair ROM. No JVD present. No lymphadenopathy.   CV: Tachycardia with ectopy, no m/r/g, dependent non-pitting edema BLE. Pulses intact, +2 bilaterally. No cyanosis, pallor or clubbing. PULMONARY:  Unlabored, regular breathing. Diminished bilaterally A&P w/o wheezes/rales/rhonchi. No accessory muscle use.  GI: BS present and normoactive. Soft, non-tender to palpation.  MSK: No erythema, warmth or tenderness. Cap refil <2 sec all extrem.  Neuro: A/Ox3. No focal deficits noted.   Skin: Warm, no lesions or rashe Psych: Normal affect and behavior. Judgement and thought content  appropriate.     Lab Results:  CBC    Component Value Date/Time   WBC 12.4 (H) 11/08/2023 1824   RBC 5.90 (H) 11/08/2023 1824   HGB 15.3 (H) 11/08/2023 1824   HGB 13.0 06/16/2018 1118   HCT 47.2 (H) 11/08/2023 1824   HCT 39.8 06/16/2018 1118   PLT 329 11/08/2023 1824   PLT 284 06/16/2018 1118   MCV 80.0 11/08/2023 1824   MCV 73 (L) 06/16/2018 1118   MCV 77 (L) 11/28/2014 0715   MCH 25.9 (L) 11/08/2023 1824   MCHC 32.4 11/08/2023 1824   RDW 13.1 11/08/2023 1824   RDW 13.3 06/16/2018 1118   RDW 13.4 11/28/2014 0715   LYMPHSABS 1.6 11/08/2023 1824   LYMPHSABS 0.8 06/16/2018 1118   LYMPHSABS 0.6 (L) 11/28/2014 0715   MONOABS 0.4 11/08/2023 1824   MONOABS 0.7 11/28/2014 0715   EOSABS 0.0 11/08/2023 1824   EOSABS 0.1 06/16/2018 1118   EOSABS 0.1 11/28/2014 0715   BASOSABS 0.0 11/08/2023 1824   BASOSABS 0.0 06/16/2018 1118   BASOSABS 0.0 11/28/2014 0715    BMET    Component Value Date/Time   NA 135 11/08/2023 1824   NA 137 06/16/2018 1118   NA 139 11/28/2014 0715   K 4.9 11/08/2023 1824   K 4.0 11/28/2014 0715   CL 100 11/08/2023 1824   CL 104 11/28/2014 0715   CO2 23 11/08/2023 1824   CO2 28 11/28/2014 0715   GLUCOSE 222 (H) 11/08/2023 1824   GLUCOSE 102 (H) 11/28/2014 0715   BUN 15 11/08/2023 1824   BUN 5 (L) 06/16/2018 1118   BUN < 5 (L) 11/28/2014 0715   CREATININE 0.99 11/08/2023 1824   CREATININE 0.67 11/28/2014 0715   CALCIUM  9.5 11/08/2023 1824   CALCIUM  8.7 (L) 11/28/2014 0715   GFRNONAA >60 11/08/2023 1824   GFRNONAA >60 11/28/2014 0715   GFRAA >60 07/14/2018 1057   GFRAA >60 11/28/2014 0715    BNP No results found for: BNP   Imaging:  DG Chest 2 View Result Date: 06/28/2024 EXAM: 2 VIEW(S) XRAY OF THE CHEST 06/26/2024 04:14:27 PM COMPARISON: 10/29/2023 CLINICAL HISTORY: asthma. Pt states productive cough, SOB,fever, congestion for 2 weeks. History of asthma, HTN, sarcoidosis. FINDINGS: LUNGS AND PLEURA: Stable coarse peripheral opacity in  the lateral left upper lobe  with possible scarring. No pulmonary edema. No pleural effusion. No pneumothorax. HEART AND MEDIASTINUM: No acute abnormality of the cardiac and mediastinal silhouettes. BONES AND SOFT TISSUES: No acute osseous abnormality. IMPRESSION: 1. Stable coarse peripheral opacity in the lateral left upper lobe, likely scarring. Electronically signed by: Katheleen Faes MD 06/28/2024 10:01 AM EDT RP Workstation: [id number]    Administration History     None           No data to display          No results found for: NITRICOXIDE      Assessment & Plan:    Assessment & Plan Asthma/COPD exacerbation Potential exacerbation of asthma/COPD with symptoms of shortness of breath and wheezing. No significant infectious symptoms, but on day five of Levaquin  prescribed by PCP. No sputum production. Prior CXR without superimposed infection. Appears slow to resolve. Previous improvement with steroids suggests an inflammatory component. Will rechallenge with prednisone  taper. Advised to complete levaquin  as she has 2 days left in rx. Continue bronchodilator regimen. Action plan in place. ED precautions reviewed. Oxygen saturation on room air 98% - Treat with prednisone  taper - Continue Levaquin  - Increase use of Advair  to twice daily dosing. Oral hygiene to follow - Continue PRN albuterol    Shortness of breath Likely related to asthma/COPD exacerbation but unable to rule out alternative etiology. No significant infectious symptoms. Oxygen levels are adequate.  - Checked lab work to rule out thyroid , cardiac etiology, or underlying anemia - Check chest x-ray - Ruled out PE with d dimer and considered CTA chest if positive  Lower extremity edema Mild swelling in both legs, possibly related to fluid retention. No significant weight change. Elevated blood pressure and heart rate may contribute to fluid retention and shortness of breath. Check BNP/BMET. - Advised to call  cardiology to notify of increased blood pressure and heart rate  Frequent premature atrial contractions (PACs) Frequent PACs noted on EKG, possibly related to stress or fluid retention. No acute cardiac symptoms. ED precautions reviewed. See above plan.  - Advised to call cardiology to notify of increased blood pressure and heart rate  Elevated blood pressure and tachycardia Possibly related to fluid retention or acute illness. See above    Advised if symptoms do not improve or worsen, to please contact office for sooner follow up or seek emergency care.   I spent 45 minutes of dedicated to the care of this patient on the date of this encounter to include pre-visit review of records, face-to-face time with the patient discussing conditions above, post visit ordering of testing, clinical documentation with the electronic health record, making appropriate referrals as documented, and communicating necessary findings to members of the patients care team.  Comer LULLA Rouleau, NP 07/10/2024  Pt aware and understands NP's role.

## 2024-07-13 ENCOUNTER — Telehealth: Payer: Self-pay

## 2024-07-13 ENCOUNTER — Ambulatory Visit (INDEPENDENT_AMBULATORY_CARE_PROVIDER_SITE_OTHER): Payer: MEDICAID | Admitting: Psychiatry

## 2024-07-13 ENCOUNTER — Other Ambulatory Visit: Payer: Self-pay | Admitting: Psychiatry

## 2024-07-13 ENCOUNTER — Encounter: Payer: Self-pay | Admitting: Psychiatry

## 2024-07-13 ENCOUNTER — Other Ambulatory Visit: Payer: Self-pay

## 2024-07-13 VITALS — BP 140/89 | HR 88 | Temp 97.0°F | Ht <= 58 in | Wt 146.8 lb

## 2024-07-13 DIAGNOSIS — F410 Panic disorder [episodic paroxysmal anxiety] without agoraphobia: Secondary | ICD-10-CM | POA: Diagnosis not present

## 2024-07-13 DIAGNOSIS — G4700 Insomnia, unspecified: Secondary | ICD-10-CM

## 2024-07-13 DIAGNOSIS — F4310 Post-traumatic stress disorder, unspecified: Secondary | ICD-10-CM

## 2024-07-13 DIAGNOSIS — F0630 Mood disorder due to known physiological condition, unspecified: Secondary | ICD-10-CM | POA: Diagnosis not present

## 2024-07-13 MED ORDER — PRAZOSIN HCL 1 MG PO CAPS: 1.0000 mg | ORAL_CAPSULE | Freq: Every day | ORAL | 1 refills | Status: DC

## 2024-07-13 NOTE — Patient Instructions (Signed)
 Continue oxycarbazepine 300 mg twice a day Start prazosin 1 mg at night  Continue trazodone  50 mg at night as needed for insomnia Next appointment: 1/6 at 8:30

## 2024-07-13 NOTE — Telephone Encounter (Signed)
 Medication management - Called patient's reported [facility] in [place], per request of Dr. Hisada, to question if they do any bubble packages each month with patient's medications and they reported they did not. Sent information back to Dr. Vickey.

## 2024-07-13 NOTE — Telephone Encounter (Signed)
 Medication management - Called patient to follow up on her report to Dr. Vickey she would like to have any prescriptions she does placed in a bubble packet with her other medications. Patient admitted she currently does not have anyone doing a bubble packet for her but would like to get this set up. Informed patient [facility] stated this is not a service they currently provide but they did state [facility] across the street from them does this for patients.  Patient stated plan to check around as she wants to get this arranged and will let our office know if she sets this up.  Patient requested until then, Dr. Hisada send in any medications orders, the Prazosin she was going to prescribed to her current [facility] and informed patient this nurse would share that information request with Dr. Vickey.

## 2024-07-20 ENCOUNTER — Encounter: Payer: Self-pay | Admitting: Internal Medicine

## 2024-07-20 ENCOUNTER — Ambulatory Visit: Payer: MEDICAID | Admitting: Internal Medicine

## 2024-07-20 VITALS — BP 120/80 | HR 89 | Temp 98.5°F | Ht <= 58 in | Wt 144.6 lb

## 2024-07-20 DIAGNOSIS — J45909 Unspecified asthma, uncomplicated: Secondary | ICD-10-CM | POA: Diagnosis not present

## 2024-07-20 DIAGNOSIS — J9611 Chronic respiratory failure with hypoxia: Secondary | ICD-10-CM

## 2024-07-20 DIAGNOSIS — G4733 Obstructive sleep apnea (adult) (pediatric): Secondary | ICD-10-CM

## 2024-07-20 DIAGNOSIS — K219 Gastro-esophageal reflux disease without esophagitis: Secondary | ICD-10-CM

## 2024-07-20 DIAGNOSIS — J4489 Other specified chronic obstructive pulmonary disease: Secondary | ICD-10-CM

## 2024-07-20 DIAGNOSIS — Z9981 Dependence on supplemental oxygen: Secondary | ICD-10-CM

## 2024-07-20 NOTE — Progress Notes (Signed)
 [facility] Pulmonary Medicine Consultation      MRN# [id number] Jennifer [name] [birth year] Brief History: Synopsis: 53 year old female with left upper lobe cavitary lesion, history of bilateral pneumonia, currently being followed by pulmonary and infectious disease.  Cavitary lesion differential at this time includes fungal infection versus sarcoidosis.  treated as fungal infection by infectious disease and with steroids by pulmonary for suspected Sarcoid.  Diagnosis of aspergilloma Previous 6-minute walk and ONO within normal limits  Has intermittent fatigue at times She does not want to taper steroids at this time Robitussin with codeine  helps her cough  CT chest August 03, 2020  Independently reviewed and also reviewed with patient Chronic bilateral cylindrical bronchiectasis Persistent cavitary lesion in the left upper lobe containing intramural soft tissue mass. The area of cavitation is similar to 2017 study with slight enlargement of the intramural soft tissue mass.   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   Patient also has extreme back pain which is unrelated to her lung disease PET scan low level activity  CT chest March 2024 No significant change in the left upper lobe cavitary lesion      CC: Follow-up assessment for acute asthma exacerbation Assessment for sleep apnea  HPI Previous office visit Acute asthma exacerbation much improved since she has received Levaquin   Patient now with stable shortness of breath No respiratory distress Continues take Advair  Rinse his mouth after use  Patient was noted to have cardiac arrhythmias We will plan for home sleep study to assess for sleep apnea  No exacerbation at this time No evidence of heart failure at this time No evidence or signs of infection at this time No respiratory distress No fevers, chills, nausea, vomiting,  diarrhea No evidence of lower extremity edema No evidence hemoptysis  Ambulating pulse oximetry in the office today did not reveal hypoxia O2 sat remained 94 to 96%  Patient on nocturnal oxygen at night  Medication:    Current Outpatient Medications:    acetaminophen  (TYLENOL ) 500 MG tablet, Take 500-1,000 mg by mouth every 6 (six) hours as needed for mild pain or fever. , Disp: , Rfl:    albuterol  (PROVENTIL ) (2.5 MG/3ML) 0.083% nebulizer solution, Take 3 mLs (2.5 mg total) by nebulization every 4 (four) hours as needed for wheezing or shortness of breath., Disp: 75 mL, Rfl: 2   bisoprolol  (ZEBETA ) 10 MG tablet, Take 2 tablets (20 mg total) by mouth daily., Disp: 90 tablet, Rfl: 0   fluticasone -salmeterol (ADVAIR  DISKUS) 250-50 MCG/ACT AEPB, Inhale 1 puff into the lungs 2 (two) times daily., Disp: 60 each, Rfl: 1   methimazole  (TAPAZOLE ) 10 MG tablet, Take 10 mg by mouth daily. (Patient not taking: Reported on 07/10/2024), Disp: , Rfl:    ondansetron  (ZOFRAN ) 4 MG tablet, Take 1 tablet (4 mg total) by mouth every 6 (six) hours as needed for nausea or vomiting. (Patient not taking: Reported on 07/10/2024), Disp: 20 tablet, Rfl: 1   Oxcarbazepine (TRILEPTAL) 300 MG tablet, Take 300 mg by mouth 2 (two) times daily., Disp: , Rfl:    polyethylene glycol powder (GLYCOLAX /MIRALAX ) 17 GM/SCOOP powder, Take 17 g by mouth as needed., Disp: 255 g, Rfl: 0   prazosin (MINIPRESS) 1 MG capsule, Take 1 capsule (1 mg total) by mouth at bedtime., Disp: 90 capsule, Rfl: 0   rosuvastatin  (CRESTOR ) 20 MG tablet, Take 20 mg by mouth daily., Disp: , Rfl:    traZODone  (DESYREL ) 50 MG tablet,  Take 50 mg by mouth at bedtime., Disp: , Rfl:    Vitamin D , Cholecalciferol , 25 MCG (1000 UT) TABS, Take 500 Units by mouth daily., Disp: 60 tablet, Rfl:   BP 120/80   Pulse 89   Temp 98.5 F (36.9 C)   Ht 4' 9 (1.448 m)   Wt 144 lb 9.6 oz (65.6 kg)   LMP 09/30/2020   SpO2 100%   BMI 31.29 kg/m      Physical  Examination:  General Appearance: No distress  EYES EOM intact.   NECK Supple, No JVD Pulmonary: normal breath sounds, No wheezing.  CardiovascularNormal S1,S2.  No m/r/g.   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 presenting with follow-up visit with history of aspergilloma with underlying ASTHMA signs and symptoms of increasing shortness of breath and dyspnea on exertion with chronic hypoxic respiratory failure which is stable with a history of allergic rhinitis and GERD   Assessment of OSA Plan for home sleep study   Chronic shortness of breath chronic respiratory failure with hypoxia Related to 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/COPD  Chronic Hypoxic resp failure due to asthma COPD -Patient benefits from oxygen therapy 2L [place]  -recommend using oxygen as prescribed -patient needs this for survival   Asthma COPD Continue Advair  Rinse mouth No exacerbation at this time  Abnormal CT chest Previous history of aspergilloma Previously had voriconazole therapy early in 2016 but developed a fungus ball on CT chest ID has taken her off voriconazole 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 All questions answered   Follow up 3 months   I spent a total of 42 minutes dedicated to the care of this patient on the date of this encounter to include pre-visit review of records, face-to-face time with the patient discussing conditions above, post visit ordering of testing, clinical documentation with the electronic health record, making appropriate referrals as documented, and communicating necessary information to the patient's healthcare team.    The Patient requires high complexity decision making for assessment and support, frequent evaluation and titration of therapies, application of advanced monitoring technologies and extensive interpretation of multiple databases.  Patient satisfied with Plan of action and management. All questions answered    Nickolas Alm Cellar, M.D.  [facility] Pulmonary & Critical Care Medicine  Medical Director [facility] [place]

## 2024-07-20 NOTE — Patient Instructions (Addendum)
      Recommend home sleep study for further evaluation Continue inhalers as prescribed Rinse mouth after use

## 2024-08-17 ENCOUNTER — Encounter: Payer: Self-pay | Admitting: Internal Medicine

## 2024-08-17 ENCOUNTER — Ambulatory Visit: Payer: MEDICAID | Attending: Internal Medicine | Admitting: Internal Medicine

## 2024-08-17 VITALS — BP 110/90 | HR 85 | Ht <= 58 in | Wt 143.1 lb

## 2024-08-17 DIAGNOSIS — D860 Sarcoidosis of lung: Secondary | ICD-10-CM | POA: Diagnosis present

## 2024-08-17 DIAGNOSIS — E7800 Pure hypercholesterolemia, unspecified: Secondary | ICD-10-CM | POA: Insufficient documentation

## 2024-08-17 DIAGNOSIS — R0602 Shortness of breath: Secondary | ICD-10-CM | POA: Insufficient documentation

## 2024-08-17 DIAGNOSIS — I493 Ventricular premature depolarization: Secondary | ICD-10-CM | POA: Diagnosis present

## 2024-08-17 MED ORDER — BISOPROLOL FUMARATE 5 MG PO TABS: 5.0000 mg | ORAL_TABLET | Freq: Every day | ORAL | 3 refills | Status: AC

## 2024-08-17 NOTE — Patient Instructions (Signed)
 Medication Instructions:  Your physician recommends the following medication changes.  RESTART TAKING: Bisoprolol  5 mg by mouth daily   *If you need a refill on your cardiac medications before your next appointment, please call your pharmacy*  Lab Work: No labs ordered today    Testing/Procedures: No test ordered today   Follow-Up: At [facility], you and your health needs are our priority.  As part of our continuing mission to provide you with exceptional heart care, our providers are all part of one team.  This team includes your primary Cardiologist (physician) and Advanced Practice Providers or APPs (Physician Assistants and Nurse Practitioners) who all work together to provide you with the care you need, when you need it.  Your next appointment:   1 month(s)  Provider:   You will see one of the following Advanced Practice Providers on your designated Care Team:   Lonni Meager, NP Lesley Maffucci, PA-C Bernardino Bring, PA-C Cadence [place], PA-C Tylene Lunch, NP Barnie Hila, NP

## 2024-08-17 NOTE — Progress Notes (Signed)
 Cardiology Office Note:  .   Date:  08/17/2024  ID:  Charlies KATHEE Needles, DOB 11-30-1970, MRN [id number] PCP: Debarah Catheryn PARAS, FNP  [facility] Providers Cardiologist:  Lonni Hanson, MD     History of Present Illness: .   Jennifer [name] is a 53 y.o. female with history of frequent PVCs, hypertension, hyperlipidemia, pulmonary sarcoidosis, hypothyroidism, asthma, and GERD, who presents for follow-up of PVCs.  She was last seen in our office in June by Barnie Hila, NP, at which time she reported stable chronic chest pain attributed to sarcoidosis.  She was concerned about increased palpitations.  She was restarted on bisoprolol  with plans for repeat event monitor to reassess PVC burden (improved from 10% to 8%).  Today, Ms. Archuleta reports that she has been recovering from some lung issues over the last month or two.  She was placed on a course of antibiotics last month, which prompted her to discontinue all of her other medications (including bisoprolol ) because she was concerned about the potential for medication interactions.  She has noted intermittent palpitations, though they are fairly infrequent.  She denies lightheadedness and syncope.  She noted some chest pain when coughing in the setting of her recent respiratory exacerbation, though she has otherwise been without chest pain.  Her shortness of breath is getting a little better.  She was recently evaluated by Dr. Isaiah in the pulmonary clinic again and is planning to undergo a home sleep study.  She has mild lower extremity edema, which is stable.  ROS: See HPI  Studies Reviewed: SABRA   EKG Interpretation Date/Time:  Thursday August 17 2024 08:58:19 EST Ventricular Rate:  85 PR Interval:  204 QRS Duration:  82 QT Interval:  356 QTC Calculation: 423 R Axis:   85  Text Interpretation: Sinus rhythm with Premature ventricular complexes Possible Left atrial enlargement Abnormal ECG When compared with ECG of 24-Dec-2023  10:10, No significant change was found Confirmed by Aalaya Yadao [id number]) on 08/17/2024 9:24:20 AM    Event monitor (02/28/2024): Predominantly sinus rhythm with frequent PVCs (8% burden).  TTE (01/26/2024): Normal LV size and wall thickness.  LVEF 55-6% with normal wall motion and grade 1 diastolic dysfunction.  GLS -15.0%.  Normal RV size and function.  Normal biatrial size.  No pericardial effusion.  Mildly calcified mitral valve with mild regurgitation.  Otherwise, no significant valvular abnormalities.  Normal CVP.  Risk Assessment/Calculations:         Physical Exam:   VS:  BP (!) 110/90 (BP Location: Left Arm, Patient Position: Sitting, Cuff Size: Normal)   Pulse 85   Ht 4' 9 (1.448 m)   Wt 143 lb 2 oz (64.9 kg)   LMP 09/30/2020   SpO2 99%   BMI 30.97 kg/m    Wt Readings from Last 3 Encounters:  08/17/24 143 lb 2 oz (64.9 kg)  07/20/24 144 lb 9.6 oz (65.6 kg)  07/10/24 145 lb (65.8 kg)    General:  NAD. Neck: No JVD or HJR. Lungs: Clear to auscultation bilaterally without wheezes or crackles. Heart: Regular rate and rhythm without murmurs, rubs, or gallops. Abdomen: Soft, nontender, nondistended. Extremities: No lower extremity edema.  ASSESSMENT AND PLAN: .    Frequent PVCs: Isolated PVC noted on EKG today.  Patient previously noted to have 10% PVC burden, which decreased to 8% after reinitiation of bisoprolol .  Unfortunately, she has been off bisoprolol  for a few weeks because she was concerned that it may interact with her  antibiotics prescribed for her asthma/COPD exacerbation in early November treated with levofloxacin  and prednisone  taper.  We have agreed to resume bisoprolol  albeit at 5 mg daily.  Sarcoidosis and shortness of breath: Patient with history of chronic pulmonary sarcoidosis.  Prior cardiac evaluation, including MRI in 2021, without evidence of cardiac involvement.  Echo in May was unremarkable.  May need to consider repeating cardiac MRI, especially if  she continues to have frequent ectopy, develops evidence of conduction disease, or has worsening dyspnea despite improvement in her lung pathology.  Hyperlipidemia: LDL reasonable at 94 on last check in 11/2023.  Continue rosuvastatin .    Dispo: Return to clinic in 1 month with APP.  Signed, Lonni Hanson, MD
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
## 2024-09-12 ENCOUNTER — Ambulatory Visit: Payer: MEDICAID | Admitting: Psychiatry

## 2024-09-16 NOTE — Progress Notes (Unsigned)
 "  Cardiology Clinic Note   Date: 09/16/2024 ID: XARENI KELCH, DOB 1971-09-06, MRN [id number]  Primary Cardiologist:  Lonni Hanson, MD  Chief Complaint   Jennifer [name] is a 54 y.o. female who presents to the clinic today for ***  Patient Profile   Jennifer [name] is followed by *** for the history outlined below.      Past medical history significant for: Palpitations/PVCs. Holter monitor 06/29/2016: HR 73 to 141 bpm, average 87 bpm.  103 isolated PACs.  Ventricular ectopy comprised 13% of total number of beats: 16,494 isolated PVCs, 51 ventricular bigeminal cycles.  No evidence of A-fib. 24-hour Holter 03/26/2017: HR 67 to 124 bpm, average 89 bpm.  Predominantly sinus rhythm.  Rare PACs.  Frequent isolated PVCs 6% burden.  Rare ventricular couplets.  No sustained arrhythmia or prolonged pauses was identified. Cardiac MRI 09/05/2020: Normal LV size/function.  Normal RV size/function.  No significant valvular abnormalities.  No LGE or scar noted.  No evidence of cardiac sarcoid or infiltrative disease. 8-day ZIO 01/11/2024: HR 48 to 147 bpm, average 96 bpm.  First-degree transient Mobitz type I second-degree AV block observed.  Rare PACs.  Frequent PVCs (10% burden). 7-day ZIO 03/30/2024: HR 59 to 138 bpm, average 87 bpm.  Rare PACs.  Frequent PVCs 8% burden.  No sustained arrhythmia or prolonged pauses observed. Dyspnea. Echo 01/26/2024: EF 55 to 60%.  No RWMA.  Grade 1 DD.  Normal RV size/function.  Mild MR. Hypertension. Hyperlipidemia.   Lipid panel 11/15/2023: LDL 94, HDL 62, TG 73, total 170. GERD. Pulmonary sarcoidosis. Asthma. Hypothyroidism.  In summary, Patient was previously followed by Dr. Monette.  She was last been seen by Dr. Monette in January 2018 with complaints of increased palpitations 24-hour Holter monitor in October 2017 showed HR 73 to 141 bpm, average 87 bpm, 13% PVCs.  Echo demonstrated EF 50 to 55%, no RWMA, normal diastolic parameters, mild MR, normal RV  size/function, normal PA pressure, frequent PVCs.  Metoprolol  was increased to 75 mg twice a day.  She establish care with Dr. Hanson on 03/09/2017.  She reported less frequent PVCs and stable dyspnea.  She complained of nocturnal coughing causing chest soreness.  Repeat 24-hour Holter to reevaluate PVC burden was ordered and showed decrease burden to 6%.  Upon follow-up in January 2019 she reported continued nocturnal coughing ever since being diagnosed with sarcoidosis.  She was referred back to pulmonology.  Repeat echo demonstrated EF 55 to 60%.  She could not undergo coronary CTA secondary to frequent PVCs.  Nuclear stress testing was a normal, low risk study.  Repeat echo and cardiac MRI performed in the latter part of 2021 as detailed above.  Upon follow-up in June 2022 she reported increased dyspnea in warm weather and with increase in pollen.  She noted feeling a bit more tired and short of breath after taking metoprolol  however sluggishness worse when not taking the medication.  She was transitioned from metoprolol  to bisoprolol .  Patient was seen in the office by Dr. Hanson on 08/21/2021 for routine follow-up.  She reported feeling no different since transitioning from metoprolol  to bisoprolol .  A trial of holding bisoprolol  was discussed but deferred.  Patient underwent hospital admission from 10/30/2023 to 11/02/2023 for sarcoidosis flare. Patient was placed on steroids and inhaler. She was discharged on doxycycline .    Patient was seen in the clinic on 12/24/2023 for follow-up.  She reported improved cough and shortness of breath since hospital admission.  She  reported being off medications including bisoprolol  since October 2024 and was trying to get reestablished with specialties to resume her care.  She reported chest pain associated with sarcoidosis unchanged from previous.  She was concerned about increased palpitations feeling like skipped beats.  She also mentioned increased dyspnea with exertion.   ZIO showed frequent PVCs (10% burden).  Echo demonstrated normal LV/RV function.  Upon follow-up in June 2025 patient reported she had not been taking bisoprolol  for the month of June.  She stated she ran out and did not call for refill.  She was not taking bisoprolol  when she wore the ZIO monitor in April.  When she restarted it in May palpitations were decreased.  Once she stopped it again in June palpitations increased.  She otherwise did not have any new cardiac complaints.  She wore a repeat 7-day ZIO to determine PVC burden on bisoprolol  which demonstrated 8% PVC burden.  Patient was last seen in the office by Dr. Mady on 08/17/2024 for routine follow-up.  She reported recovering from lung issues over the last month or 2.  She was placed on a course of antibiotic in November which prompted her to discontinue all medications including bisoprolol  due to concern of potential medication interactions.  She noted intermittent and infrequent palpitations.  She reported chest pain with coughing.  Dyspnea was improving.  She was pending home sleep study with pulmonology.  Bisoprolol  was resumed.  She was scheduled for close follow-up.     History of Present Illness    Today, patient ***  Palpitations/PVCs Holter monitor October 2017 demonstrated a 13% PVC burden.  She was placed on metoprolol  and PVC burden improved to 6% in July 2018.  Echo November 2021 demonstrated normal LV/RV function, Grade I DD.  Cardiac MRI December 2021 showed no evidence of cardiac sarcoid or infiltrative disease.  In June 2022 she was transitioned from metoprolol  to bisoprolol  secondary to complaints of fatigue and dizziness. 8-day ZIO demonstrated 10% PVC burden.  Patient reports not taking bisoprolol  while wearing the heart monitor.  Bisoprolol  was sent restarted and she wore a 7-day ZIO which showed improved PVC burden to 8%.  The monitor was intended to be worn several weeks after reinitiating metoprolol  however it was sent to  her early and she wore it right at the restart of bisoprolol .  In early December she was started on antibiotic for lung issues and once again stop bisoprolol .  It was restarted at her visit with Dr. Mady on 12/11.  Today patient***EKG*** - Continue bisoprolol . - Referred to EP***   Dyspnea/pulmonary sarcoidosis Cardiac MRI December 2021 showed no evidence of cardiac sarcoid or infiltrative disease.  Echo May 2025 showed EF 55 to 60%, no RWMA, grade 1 DD, normal RV size/function, mild MR.  Patient reports baseline dyspnea particularly at night. She wears 1 L supplement O2 when sleeping.*** - Continue to follow with pulmonology.    Hyperlipidemia LDL 94 March 2025, at goal.  - Continue rosuvastatin .  ROS: All other systems reviewed and are otherwise negative except as noted in History of Present Illness.  EKGs/Labs Reviewed        11/08/2023: ALT 34; AST 20 07/10/2024: BUN 14; Creatinine, Ser 0.98; Potassium 3.5; Sodium 138   07/10/2024: Hemoglobin 12.7; WBC 4.0   07/10/2024: TSH 3.093   07/10/2024: B Natriuretic Peptide 9.6  ***  Risk Assessment/Calculations    {Does this patient have ATRIAL FIBRILLATION?:[phone]} No BP recorded.  {Refresh Note OR Click here to enter BP  :  1}***        Physical Exam    VS:  LMP 09/30/2020  , BMI There is no height or weight on file to calculate BMI.  GEN: Well nourished, well developed, in no acute distress. Neck: No JVD or carotid bruits. Cardiac: *** RRR. *** No murmur. No rubs or gallops.   Respiratory:  Respirations regular and unlabored. Clear to auscultation without rales, wheezing or rhonchi. GI: Soft, nontender, nondistended. Extremities: Radials/DP/PT 2+ and equal bilaterally. No clubbing or cyanosis. No edema ***  Skin: Warm and dry, no rash. Neuro: Strength intact.  Assessment & Plan   ***  Disposition: ***     {Are you ordering a CV Procedure (e.g. stress test, cath, DCCV, TEE, etc)?   Press F2        :[id number]}    Signed, Barnie HERO. Eiman Maret, DNP, NP-C  "

## 2024-09-18 ENCOUNTER — Ambulatory Visit: Payer: MEDICAID | Attending: Student | Admitting: Student

## 2024-09-21 ENCOUNTER — Encounter: Payer: Self-pay | Admitting: Nurse Practitioner

## 2024-09-21 ENCOUNTER — Other Ambulatory Visit: Payer: Self-pay | Admitting: Nurse Practitioner

## 2024-09-21 DIAGNOSIS — Z1231 Encounter for screening mammogram for malignant neoplasm of breast: Secondary | ICD-10-CM

## 2024-10-23 ENCOUNTER — Ambulatory Visit: Payer: MEDICAID | Admitting: Psychiatry
